# Patient Record
Sex: Female | Born: 1945 | Race: Black or African American | Hispanic: No | Marital: Married | Smoking: Former smoker
Health system: Southern US, Community
[De-identification: ages and names within clinical notes are randomized; demographics above are authoritative.]

## PROBLEM LIST (undated history)

## (undated) DIAGNOSIS — J309 Allergic rhinitis, unspecified: Secondary | ICD-10-CM

## (undated) DIAGNOSIS — E119 Type 2 diabetes mellitus without complications: Secondary | ICD-10-CM

## (undated) DIAGNOSIS — I10 Essential (primary) hypertension: Secondary | ICD-10-CM

## (undated) DIAGNOSIS — G309 Alzheimer's disease, unspecified: Principal | ICD-10-CM

## (undated) DIAGNOSIS — E785 Hyperlipidemia, unspecified: Secondary | ICD-10-CM

## (undated) DIAGNOSIS — R569 Unspecified convulsions: Secondary | ICD-10-CM

## (undated) DIAGNOSIS — F0280 Dementia in other diseases classified elsewhere without behavioral disturbance: Secondary | ICD-10-CM

## (undated) DIAGNOSIS — K649 Unspecified hemorrhoids: Secondary | ICD-10-CM

## (undated) HISTORY — PX: OTHER SURGICAL HISTORY: SHX169

## (undated) HISTORY — DX: Hyperlipidemia, unspecified: E78.5

## (undated) HISTORY — DX: Dementia in other diseases classified elsewhere without behavioral disturbance: F02.80

## (undated) HISTORY — DX: Allergic rhinitis, unspecified: J30.9

## (undated) HISTORY — PX: ECTOPIC PREGNANCY SURGERY: SHX613

## (undated) HISTORY — DX: Unspecified hemorrhoids: K64.9

## (undated) HISTORY — DX: Essential (primary) hypertension: I10

## (undated) HISTORY — DX: Alzheimer's disease, unspecified: G30.9

## (undated) HISTORY — DX: Type 2 diabetes mellitus without complications: E11.9

---

## 1998-05-02 ENCOUNTER — Encounter: Payer: Self-pay | Admitting: Family Medicine

## 1998-05-02 ENCOUNTER — Ambulatory Visit (HOSPITAL_COMMUNITY): Admission: RE | Admit: 1998-05-02 | Discharge: 1998-05-02 | Payer: Self-pay | Admitting: Family Medicine

## 1998-05-20 ENCOUNTER — Ambulatory Visit (HOSPITAL_COMMUNITY): Admission: RE | Admit: 1998-05-20 | Discharge: 1998-05-20 | Payer: Self-pay | Admitting: Family Medicine

## 1998-05-20 ENCOUNTER — Encounter: Payer: Self-pay | Admitting: Family Medicine

## 2003-01-15 ENCOUNTER — Ambulatory Visit (HOSPITAL_COMMUNITY): Admission: RE | Admit: 2003-01-15 | Discharge: 2003-01-15 | Payer: Self-pay | Admitting: Family Medicine

## 2003-03-11 ENCOUNTER — Emergency Department (HOSPITAL_COMMUNITY): Admission: EM | Admit: 2003-03-11 | Discharge: 2003-03-11 | Payer: Self-pay | Admitting: Emergency Medicine

## 2003-05-10 ENCOUNTER — Other Ambulatory Visit: Admission: RE | Admit: 2003-05-10 | Discharge: 2003-05-10 | Payer: Self-pay | Admitting: Family Medicine

## 2004-01-10 ENCOUNTER — Ambulatory Visit: Payer: Self-pay | Admitting: *Deleted

## 2004-02-14 ENCOUNTER — Ambulatory Visit: Payer: Self-pay | Admitting: Nurse Practitioner

## 2004-05-13 ENCOUNTER — Ambulatory Visit: Payer: Self-pay | Admitting: Nurse Practitioner

## 2004-05-28 ENCOUNTER — Ambulatory Visit: Payer: Self-pay | Admitting: Nurse Practitioner

## 2005-02-17 ENCOUNTER — Ambulatory Visit: Payer: Self-pay | Admitting: Nurse Practitioner

## 2005-12-18 ENCOUNTER — Ambulatory Visit: Payer: Self-pay | Admitting: Nurse Practitioner

## 2006-01-07 ENCOUNTER — Ambulatory Visit (HOSPITAL_COMMUNITY): Admission: RE | Admit: 2006-01-07 | Discharge: 2006-01-07 | Payer: Self-pay | Admitting: Family Medicine

## 2006-04-29 ENCOUNTER — Ambulatory Visit: Payer: Self-pay | Admitting: Nurse Practitioner

## 2006-05-03 ENCOUNTER — Ambulatory Visit: Payer: Self-pay | Admitting: Nurse Practitioner

## 2006-05-05 ENCOUNTER — Ambulatory Visit: Payer: Self-pay | Admitting: Nurse Practitioner

## 2006-07-29 ENCOUNTER — Ambulatory Visit: Payer: Self-pay | Admitting: Nurse Practitioner

## 2006-10-07 ENCOUNTER — Ambulatory Visit: Payer: Self-pay | Admitting: Family Medicine

## 2007-01-12 ENCOUNTER — Encounter (INDEPENDENT_AMBULATORY_CARE_PROVIDER_SITE_OTHER): Payer: Self-pay | Admitting: *Deleted

## 2007-01-26 ENCOUNTER — Ambulatory Visit: Payer: Self-pay | Admitting: Family Medicine

## 2007-06-10 ENCOUNTER — Ambulatory Visit: Payer: Self-pay | Admitting: Family Medicine

## 2007-06-24 ENCOUNTER — Ambulatory Visit: Payer: Self-pay | Admitting: Family Medicine

## 2007-08-23 ENCOUNTER — Encounter (INDEPENDENT_AMBULATORY_CARE_PROVIDER_SITE_OTHER): Payer: Self-pay | Admitting: Nurse Practitioner

## 2007-08-23 ENCOUNTER — Ambulatory Visit: Payer: Self-pay | Admitting: Family Medicine

## 2007-08-23 LAB — CONVERTED CEMR LAB
ALT: 23 units/L (ref 0–35)
AST: 21 units/L (ref 0–37)
Albumin: 4.5 g/dL (ref 3.5–5.2)
Alkaline Phosphatase: 88 units/L (ref 39–117)
BUN: 11 mg/dL (ref 6–23)
Basophils Absolute: 0 10*3/uL (ref 0.0–0.1)
Basophils Relative: 1 % (ref 0–1)
CO2: 22 meq/L (ref 19–32)
Calcium: 9.7 mg/dL (ref 8.4–10.5)
Chloride: 101 meq/L (ref 96–112)
Cholesterol: 222 mg/dL — ABNORMAL HIGH (ref 0–200)
Creatinine, Ser: 0.64 mg/dL (ref 0.40–1.20)
Eosinophils Absolute: 0.5 10*3/uL (ref 0.0–0.7)
Eosinophils Relative: 8 % — ABNORMAL HIGH (ref 0–5)
Glucose, Bld: 147 mg/dL — ABNORMAL HIGH (ref 70–99)
HCT: 39.2 % (ref 36.0–46.0)
HDL: 36 mg/dL — ABNORMAL LOW (ref >39)
Hemoglobin: 12.6 g/dL (ref 12.0–15.0)
LDL Cholesterol: 143 mg/dL — ABNORMAL HIGH (ref 0–99)
Lymphocytes Relative: 38 % (ref 12–46)
Lymphs Abs: 2.3 10*3/uL (ref 0.7–4.0)
MCHC: 32.1 g/dL (ref 30.0–36.0)
MCV: 88.9 fL (ref 78.0–100.0)
Monocytes Absolute: 0.6 10*3/uL (ref 0.1–1.0)
Monocytes Relative: 9 % (ref 3–12)
Neutro Abs: 2.7 10*3/uL (ref 1.7–7.7)
Neutrophils Relative %: 45 % (ref 43–77)
Platelets: 310 10*3/uL (ref 150–400)
Potassium: 4.2 meq/L (ref 3.5–5.3)
RBC: 4.41 M/uL (ref 3.87–5.11)
RDW: 13.6 % (ref 11.5–15.5)
Sodium: 141 meq/L (ref 135–145)
TSH: 1.525 microintl units/mL (ref 0.350–5.50)
Total Bilirubin: 0.7 mg/dL (ref 0.3–1.2)
Total CHOL/HDL Ratio: 6.2
Total Protein: 7.4 g/dL (ref 6.0–8.3)
Triglycerides: 217 mg/dL — ABNORMAL HIGH (ref <150)
VLDL: 43 mg/dL — ABNORMAL HIGH (ref 0–40)
WBC: 6 10*3/uL (ref 4.0–10.5)

## 2007-09-01 ENCOUNTER — Ambulatory Visit (HOSPITAL_COMMUNITY): Admission: RE | Admit: 2007-09-01 | Discharge: 2007-09-01 | Payer: Self-pay | Admitting: Family Medicine

## 2007-10-20 ENCOUNTER — Ambulatory Visit: Payer: Self-pay | Admitting: Internal Medicine

## 2007-11-10 ENCOUNTER — Ambulatory Visit: Payer: Self-pay | Admitting: Family Medicine

## 2007-11-10 LAB — CONVERTED CEMR LAB: Microalb, Ur: 0.77 mg/dL (ref 0.00–1.89)

## 2007-11-16 ENCOUNTER — Ambulatory Visit: Payer: Self-pay | Admitting: Family Medicine

## 2007-11-16 LAB — CONVERTED CEMR LAB
Cholesterol: 216 mg/dL — ABNORMAL HIGH (ref 0–200)
HDL: 37 mg/dL — ABNORMAL LOW (ref >39)
LDL Cholesterol: 138 mg/dL — ABNORMAL HIGH (ref 0–99)
Total CHOL/HDL Ratio: 5.8
Triglycerides: 205 mg/dL — ABNORMAL HIGH (ref <150)
VLDL: 41 mg/dL — ABNORMAL HIGH (ref 0–40)

## 2007-12-20 ENCOUNTER — Ambulatory Visit: Payer: Self-pay | Admitting: Internal Medicine

## 2008-05-10 ENCOUNTER — Ambulatory Visit: Payer: Self-pay | Admitting: Family Medicine

## 2008-05-10 LAB — CONVERTED CEMR LAB
ALT: 16 units/L (ref 0–35)
Cholesterol: 122 mg/dL (ref 0–200)
HDL: 35 mg/dL — ABNORMAL LOW (ref >39)
LDL Cholesterol: 64 mg/dL (ref 0–99)
Total CHOL/HDL Ratio: 3.5
Triglycerides: 115 mg/dL (ref <150)
VLDL: 23 mg/dL (ref 0–40)

## 2008-05-22 ENCOUNTER — Ambulatory Visit: Payer: Self-pay | Admitting: Family Medicine

## 2008-06-08 ENCOUNTER — Ambulatory Visit: Payer: Self-pay | Admitting: Family Medicine

## 2008-10-02 ENCOUNTER — Ambulatory Visit: Payer: Self-pay | Admitting: Internal Medicine

## 2008-10-10 ENCOUNTER — Ambulatory Visit: Payer: Self-pay | Admitting: Internal Medicine

## 2008-11-27 ENCOUNTER — Telehealth (INDEPENDENT_AMBULATORY_CARE_PROVIDER_SITE_OTHER): Payer: Self-pay | Admitting: *Deleted

## 2008-12-19 ENCOUNTER — Telehealth (INDEPENDENT_AMBULATORY_CARE_PROVIDER_SITE_OTHER): Payer: Self-pay | Admitting: *Deleted

## 2008-12-20 ENCOUNTER — Telehealth (INDEPENDENT_AMBULATORY_CARE_PROVIDER_SITE_OTHER): Payer: Self-pay | Admitting: *Deleted

## 2009-01-03 ENCOUNTER — Ambulatory Visit: Payer: Self-pay | Admitting: Family Medicine

## 2009-01-03 LAB — CONVERTED CEMR LAB
Herpes Simplex Vrs I&II-IgM Ab (EIA): 0.2
Microalb, Ur: 2.9 mg/dL — ABNORMAL HIGH (ref 0.00–1.89)

## 2009-01-04 ENCOUNTER — Encounter (INDEPENDENT_AMBULATORY_CARE_PROVIDER_SITE_OTHER): Payer: Self-pay | Admitting: Family Medicine

## 2009-01-31 ENCOUNTER — Encounter (INDEPENDENT_AMBULATORY_CARE_PROVIDER_SITE_OTHER): Payer: Self-pay | Admitting: Internal Medicine

## 2009-01-31 ENCOUNTER — Ambulatory Visit: Payer: Self-pay | Admitting: Internal Medicine

## 2009-01-31 LAB — CONVERTED CEMR LAB
ALT: 16 units/L (ref 0–35)
AST: 20 units/L (ref 0–37)
Albumin: 5.1 g/dL (ref 3.5–5.2)
Alkaline Phosphatase: 90 units/L (ref 39–117)
BUN: 11 mg/dL (ref 6–23)
CO2: 24 meq/L (ref 19–32)
Calcium: 10.4 mg/dL (ref 8.4–10.5)
Chloride: 100 meq/L (ref 96–112)
Creatinine, Ser: 0.65 mg/dL (ref 0.40–1.20)
Glucose, Bld: 116 mg/dL — ABNORMAL HIGH (ref 70–99)
Potassium: 3.8 meq/L (ref 3.5–5.3)
Sodium: 141 meq/L (ref 135–145)
Total Bilirubin: 0.8 mg/dL (ref 0.3–1.2)
Total Protein: 7.7 g/dL (ref 6.0–8.3)

## 2009-02-04 ENCOUNTER — Ambulatory Visit: Payer: Self-pay | Admitting: Internal Medicine

## 2009-02-06 ENCOUNTER — Ambulatory Visit: Payer: Self-pay | Admitting: Internal Medicine

## 2009-02-08 ENCOUNTER — Ambulatory Visit (HOSPITAL_COMMUNITY): Admission: RE | Admit: 2009-02-08 | Discharge: 2009-02-08 | Payer: Self-pay | Admitting: Internal Medicine

## 2009-02-14 ENCOUNTER — Ambulatory Visit: Payer: Self-pay | Admitting: Internal Medicine

## 2009-03-14 ENCOUNTER — Ambulatory Visit: Payer: Self-pay | Admitting: Internal Medicine

## 2009-04-02 ENCOUNTER — Ambulatory Visit: Payer: Self-pay | Admitting: Internal Medicine

## 2009-05-06 ENCOUNTER — Ambulatory Visit: Payer: Self-pay | Admitting: Internal Medicine

## 2009-05-06 ENCOUNTER — Encounter (INDEPENDENT_AMBULATORY_CARE_PROVIDER_SITE_OTHER): Payer: Self-pay | Admitting: Family Medicine

## 2009-05-06 LAB — CONVERTED CEMR LAB
ALT: 18 units/L (ref 0–35)
AST: 24 units/L (ref 0–37)
Albumin: 4.7 g/dL (ref 3.5–5.2)
Alkaline Phosphatase: 95 units/L (ref 39–117)
BUN: 12 mg/dL (ref 6–23)
CO2: 23 meq/L (ref 19–32)
Calcium: 9.6 mg/dL (ref 8.4–10.5)
Chloride: 99 meq/L (ref 96–112)
Cholesterol: 140 mg/dL (ref 0–200)
Creatinine, Ser: 0.66 mg/dL (ref 0.40–1.20)
Glucose, Bld: 94 mg/dL (ref 70–99)
HDL: 35 mg/dL — ABNORMAL LOW (ref >39)
Hgb A1c MFr Bld: 7.1 % — ABNORMAL HIGH (ref 4.6–6.1)
LDL Cholesterol: 76 mg/dL (ref 0–99)
Microalb, Ur: 1.28 mg/dL (ref 0.00–1.89)
Potassium: 3.9 meq/L (ref 3.5–5.3)
Sodium: 139 meq/L (ref 135–145)
Total Bilirubin: 0.6 mg/dL (ref 0.3–1.2)
Total CHOL/HDL Ratio: 4
Total Protein: 7.5 g/dL (ref 6.0–8.3)
Triglycerides: 144 mg/dL (ref <150)
VLDL: 29 mg/dL (ref 0–40)
Vit D, 25-Hydroxy: 36 ng/mL (ref 30–89)

## 2009-07-24 ENCOUNTER — Ambulatory Visit: Payer: Self-pay | Admitting: Internal Medicine

## 2009-08-05 ENCOUNTER — Ambulatory Visit: Payer: Self-pay | Admitting: Family Medicine

## 2009-12-17 ENCOUNTER — Ambulatory Visit: Payer: Self-pay | Admitting: Family Medicine

## 2009-12-31 ENCOUNTER — Ambulatory Visit: Payer: Self-pay | Admitting: Family Medicine

## 2009-12-31 LAB — CONVERTED CEMR LAB
ALT: 13 units/L (ref 0–35)
AST: 19 units/L (ref 0–37)
Albumin: 4.5 g/dL (ref 3.5–5.2)
Alkaline Phosphatase: 77 units/L (ref 39–117)
Anti Nuclear Antibody(ANA): NEGATIVE
BUN: 11 mg/dL (ref 6–23)
Basophils Absolute: 0 10*3/uL (ref 0.0–0.1)
Basophils Relative: 0 % (ref 0–1)
CO2: 26 meq/L (ref 19–32)
CRP: 1.6 mg/dL — ABNORMAL HIGH (ref <0.6)
Calcium: 9.8 mg/dL (ref 8.4–10.5)
Chloride: 100 meq/L (ref 96–112)
Creatinine, Ser: 0.64 mg/dL (ref 0.40–1.20)
Eosinophils Absolute: 0.5 10*3/uL (ref 0.0–0.7)
Eosinophils Relative: 8 % — ABNORMAL HIGH (ref 0–5)
Glucose, Bld: 105 mg/dL — ABNORMAL HIGH (ref 70–99)
HCT: 39.4 % (ref 36.0–46.0)
Hemoglobin: 12.5 g/dL (ref 12.0–15.0)
Hgb A1c MFr Bld: 6.7 % — ABNORMAL HIGH (ref <5.7)
Lymphocytes Relative: 40 % (ref 12–46)
Lymphs Abs: 2.5 10*3/uL (ref 0.7–4.0)
MCHC: 31.7 g/dL (ref 30.0–36.0)
MCV: 90.2 fL (ref 78.0–100.0)
Monocytes Absolute: 0.6 10*3/uL (ref 0.1–1.0)
Monocytes Relative: 10 % (ref 3–12)
Neutro Abs: 2.6 10*3/uL (ref 1.7–7.7)
Neutrophils Relative %: 42 % — ABNORMAL LOW (ref 43–77)
Platelets: 300 10*3/uL (ref 150–400)
Potassium: 3.8 meq/L (ref 3.5–5.3)
RBC: 4.37 M/uL (ref 3.87–5.11)
RDW: 13.3 % (ref 11.5–15.5)
Sed Rate: 20 mm/hr (ref 0–22)
Sodium: 138 meq/L (ref 135–145)
TSH: 2.43 microintl units/mL (ref 0.350–4.500)
Total Bilirubin: 0.7 mg/dL (ref 0.3–1.2)
Total Protein: 7.4 g/dL (ref 6.0–8.3)
WBC: 6.2 10*3/uL (ref 4.0–10.5)

## 2010-04-15 ENCOUNTER — Ambulatory Visit (HOSPITAL_COMMUNITY)
Admission: RE | Admit: 2010-04-15 | Discharge: 2010-04-15 | Payer: Self-pay | Source: Home / Self Care | Attending: Internal Medicine | Admitting: Internal Medicine

## 2010-05-29 NOTE — Progress Notes (Signed)
Summary: triage CRACKED SKIN CORNERS OF MOUTH  Phone Note Call from Patient   Reason for Call: Talk to Nurse Details for Reason: CRACKED SKIN CORNERS OF MOUTH Summary of Call: 11/27/08 Patient's corners her mouth are cracked and sore.Marland KitchenMarland KitchenPatient is diabetic...no known fevers or cold...patient has been putting vaseline on it...advised to apply otc antibiotic cream to the outside of corners of mouth.Marland KitchenMarland KitchenIf gets any worse or no improvement call back... Initial call taken by: Conchita Paris RN

## 2010-05-29 NOTE — Progress Notes (Signed)
Summary: triage call  Phone Note Call from Patient   Details for Reason: left vm stating used antibacterail on the corners of her  mouth, not improving. Rash on arm-call back to patient, lft vm for her to return my call to discuss.    Initial call taken by: Blondell Reveal RN,  December 19, 2008 4:11 PM

## 2010-05-29 NOTE — Progress Notes (Signed)
Summary: triage-rash  Phone Note Call from Patient   Reason for Call: Talk to Nurse Summary of Call: patient called  stating she has rash on arm that is itchy...patient has been pulling weeds outside...patient had previously called 8/3 with cracked lips...states now they are better but not yet healed...advised patient to use OTC blistex for lips and calamine lotion for rash..advised patent to call back if needed. Initial call taken by: Conchita Paris,  December 20, 2008 1:04 PM

## 2010-07-15 ENCOUNTER — Encounter (INDEPENDENT_AMBULATORY_CARE_PROVIDER_SITE_OTHER): Payer: Self-pay | Admitting: *Deleted

## 2010-07-15 LAB — CONVERTED CEMR LAB
ALT: 16 units/L (ref 0–35)
AST: 18 units/L (ref 0–37)
Albumin: 4.8 g/dL (ref 3.5–5.2)
Alkaline Phosphatase: 77 units/L (ref 39–117)
BUN: 10 mg/dL (ref 6–23)
CO2: 23 meq/L (ref 19–32)
Calcium: 10.1 mg/dL (ref 8.4–10.5)
Chloride: 103 meq/L (ref 96–112)
Cholesterol: 168 mg/dL (ref 0–200)
Creatinine, Ser: 0.72 mg/dL (ref 0.40–1.20)
Glucose, Bld: 138 mg/dL — ABNORMAL HIGH (ref 70–99)
HDL: 41 mg/dL (ref >39)
LDL Cholesterol: 93 mg/dL (ref 0–99)
Microalb, Ur: 0.96 mg/dL (ref 0.00–1.89)
Potassium: 3.8 meq/L (ref 3.5–5.3)
Sodium: 140 meq/L (ref 135–145)
TSH: 1.003 microintl units/mL (ref 0.350–4.500)
Total Bilirubin: 0.2 mg/dL — ABNORMAL LOW (ref 0.3–1.2)
Total CHOL/HDL Ratio: 4.1
Total Protein: 7.8 g/dL (ref 6.0–8.3)
Triglycerides: 170 mg/dL — ABNORMAL HIGH (ref <150)
VLDL: 34 mg/dL (ref 0–40)
Vitamin B-12: 544 pg/mL (ref 211–911)

## 2012-01-28 ENCOUNTER — Ambulatory Visit: Payer: Self-pay | Admitting: Family Medicine

## 2012-02-02 ENCOUNTER — Encounter: Payer: Self-pay | Admitting: Family Medicine

## 2012-02-02 ENCOUNTER — Ambulatory Visit (INDEPENDENT_AMBULATORY_CARE_PROVIDER_SITE_OTHER): Payer: Self-pay | Admitting: Family Medicine

## 2012-02-02 VITALS — BP 148/84 | HR 71 | Ht 68.0 in | Wt 186.0 lb

## 2012-02-02 DIAGNOSIS — I10 Essential (primary) hypertension: Secondary | ICD-10-CM

## 2012-02-02 DIAGNOSIS — E785 Hyperlipidemia, unspecified: Secondary | ICD-10-CM

## 2012-02-02 DIAGNOSIS — E119 Type 2 diabetes mellitus without complications: Secondary | ICD-10-CM | POA: Insufficient documentation

## 2012-02-02 DIAGNOSIS — Z0000 Encounter for general adult medical examination without abnormal findings: Secondary | ICD-10-CM

## 2012-02-02 DIAGNOSIS — Z23 Encounter for immunization: Secondary | ICD-10-CM

## 2012-02-02 MED ORDER — METFORMIN HCL 1000 MG PO TABS: 1000.0000 mg | ORAL_TABLET | Freq: Two times a day (BID) | ORAL | Status: DC

## 2012-02-02 MED ORDER — SIMVASTATIN 20 MG PO TABS: 20.0000 mg | ORAL_TABLET | Freq: Every day | ORAL | Status: DC

## 2012-02-02 NOTE — Assessment & Plan Note (Addendum)
LDL goal <70 due to concurrent diabetes. Has been taking crestor 20mg  daily, but reports that this upsets her stomach. Will switch to simvastatin 20mg  daily (have to go with lower dose due to cross-reactivity with amlodipine). Unable to do labs today as she would have to pay out of pocket, but her medicare kicks in next month.   F/u in early December 2013 for "Welcome to Medicare" visit. I will see her 1-2 weeks after that to check lipid panel and see how she is doing.

## 2012-02-02 NOTE — Assessment & Plan Note (Addendum)
Pt taking metformin 1000mg  BID. New patient - would like to check hgb a1c and BMET today, but her medicare will become active next month and she would prefer to wait until then to avoid out of pocket cost. Creatinine was 0.72 in early 2012. Will give refill of metformin to last 1-2 months until can return with medicare coverage for labs. Last a1c that I can see from EMR was 6.7 several years ago. Will obtain [facility] records.  Foot exam - needs at next visit Eye exam - last visit 1 year ago, will need to return in next few months Renal protection- on ARB Cardiac protection - on aspirin and statin  F/u in early December 2013 for "Welcome to Medicare" visit. I will see her 1-2 weeks after that to check labs and see how she is doing.

## 2012-02-02 NOTE — Patient Instructions (Addendum)
I am sending a refill of your metformin (diabetes medicine) to [facility]. I am also sending a new cholesterol medicine there, which should be $4.00. You should STOP taking the Crestor. Let me know if you have any problems with the medication.  You can pick them up at the [facility] at [facility] in [place].  On your way out today please make an appointment for your "Welcome to Medicare Visit" for early December. Also make an appointment to see me 1-2 weeks after that appointment (in mid-December)  When I see you we will check your bloodwork.

## 2012-02-02 NOTE — Progress Notes (Signed)
Patient ID: Paula Reed, female   DOB: [birth year], 66 y.o.   MRN: [id number]  HPI: Paula Reed is a 66 y.o. female here to establish care. She was previously a patient of [facility], which recently closed. Has not been seen there in over 6 months. She has no complaints today but is requesting a refill on her metformin. She will turn 66 next month and has her Medicare enrollment pending.  Diabetes: Taking metformin 1000mg  BID. Checks blood sugar at home and has lowest values of high 90s. Told RN that average CBGs are 225-230. Takes aspirin 81mg  daily.  Blood pressure: Takes amlodipine 10mg  daily, and losartan 100mg  daily. Checks at home and gets 135-140. Denies headache, vision changes, chest pain, shortness of breath, palpitations, swelling in legs, or lightheadedness.  Hyperlipidemia: takes crestor 20mg , but says it upsets her stomach.   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   ROS  Per HPI. Questionnaire form positive for headaches although pt denied HA in interview.  PMH:  Allergic rhinitis, diabetes, hypertension, and hyperlipidemia.  No hospitalizations. 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  Family Hx: Mom with hypertension. Dad with hypertension and heart disease (cause of death).  Health Maintenance: Last mammogram done in 03/2010, normal Pap smear - got done at [facility], but has been a while. EMR shows negative pap in 2009.  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 Lives with her husband. Completed some high school. No alcohol. Does not exercise. Nonsmoker. No drug use. Feels safe in relationship. Negative depression screen on 02/02/12.   Exam: Gen: NAD, pleasant, cooperative HEENT: no oral lesions, has dentures Heart: RRR Lungs: CTAB Ext: No LE edema Neuro: nonfocal, speech intact

## 2012-02-02 NOTE — Assessment & Plan Note (Signed)
Flu shot given today. Due for a mammogram -will discuss this at next visit after her medicare is effective. Need to discuss colonoscopy with pt as well. Last pap negative in 2009 - should repeat this or at least obtain records showing prior negative paps. Will obtain [facility] records.

## 2012-02-02 NOTE — Assessment & Plan Note (Signed)
Currently on amlodipine 10mg  and losartan 100mg  daily. BP mildly elevated today at 148/86, but pt did not take medications this morning. Will continue current regimen and recheck at f/u appt in mid December 2013.

## 2012-03-15 ENCOUNTER — Encounter: Payer: Self-pay | Admitting: Family Medicine

## 2012-04-14 ENCOUNTER — Telehealth: Payer: Self-pay | Admitting: Family Medicine

## 2012-04-14 NOTE — Telephone Encounter (Signed)
[facility] is calling for refills on Generic Norvasc 10mg , Losartan 100mg  and Lamitil 250mg .

## 2012-04-15 ENCOUNTER — Other Ambulatory Visit: Payer: Self-pay | Admitting: Family Medicine

## 2012-04-15 MED ORDER — AMLODIPINE BESYLATE 10 MG PO TABS: 10.0000 mg | ORAL_TABLET | Freq: Every day | ORAL | Status: DC

## 2012-04-15 MED ORDER — LOSARTAN POTASSIUM 100 MG PO TABS: 100.0000 mg | ORAL_TABLET | Freq: Every day | ORAL | Status: DC

## 2012-04-15 NOTE — Telephone Encounter (Signed)
Forward to PCP for refills 

## 2012-04-15 NOTE — Telephone Encounter (Signed)
I called the # attached to the message and as soon as it answered the recording said [facility].

## 2012-04-15 NOTE — Telephone Encounter (Signed)
Will send in a month's refill for amlodipine and losartan. I have not prescribed lamisil to patient previously, and in my previous visit with her she was unable to say why she is taking this, so I do not feel comfortable prescribing this to her now. Pt has f/u appt scheduled for one week from today, at which time we can discuss why she is on lamisil.  If necessary, please call [facility] and/or patient back to tell them I will not be refilling lamisil at this time. Thanks!

## 2012-04-15 NOTE — Telephone Encounter (Signed)
Losartan and [facility] (see Rx info below from Dr. Pollie Meyer).  Gaylene Brooks, RN

## 2012-04-15 NOTE — Telephone Encounter (Signed)
Forward back to [place] who took the message to find out which [facility].[place], Rodena Medin

## 2012-04-15 NOTE — Telephone Encounter (Signed)
Which [facility]? I have only sent scripts to [facility] for her in the past. Once we know which [facility] will be happy to send in rx for amlodipine & losartan. If you find it out, okay to call in order for:  amlodipine 10mg  1 tab PO daily #30 pills losartan 100mg  1 tab PO daily #30 pills  Otherwise I can just send it in electronically when we know the pharmacy. Just let me know. Thanks!

## 2012-04-15 NOTE — Telephone Encounter (Signed)
Forward to [place] for review.

## 2012-04-22 ENCOUNTER — Ambulatory Visit (INDEPENDENT_AMBULATORY_CARE_PROVIDER_SITE_OTHER): Payer: Medicare Other | Admitting: Family Medicine

## 2012-04-22 VITALS — BP 158/78 | Temp 97.9°F | Ht 68.0 in | Wt 187.0 lb

## 2012-04-22 DIAGNOSIS — R011 Cardiac murmur, unspecified: Secondary | ICD-10-CM

## 2012-04-22 DIAGNOSIS — E119 Type 2 diabetes mellitus without complications: Secondary | ICD-10-CM

## 2012-04-22 DIAGNOSIS — Z0000 Encounter for general adult medical examination without abnormal findings: Secondary | ICD-10-CM

## 2012-04-22 DIAGNOSIS — E785 Hyperlipidemia, unspecified: Secondary | ICD-10-CM

## 2012-04-22 DIAGNOSIS — I10 Essential (primary) hypertension: Secondary | ICD-10-CM

## 2012-04-22 LAB — POCT GLYCOSYLATED HEMOGLOBIN (HGB A1C): Hemoglobin A1C: 6.2

## 2012-04-22 NOTE — Assessment & Plan Note (Addendum)
We were unable to check labs last visit (her first visit to the [facility]) due to financial concerns, but now she has Medicare. Pt has eaten already today, so she will come back for fasting labs, at which time we'll check CMET, lipid panel, CBC.   *If requires increase in statin, will need to switch to a different statin as she is also on amlodipine and is on the max dose of simvastatin (20mg ) for concurrent use with amlodipine.

## 2012-04-22 NOTE — Assessment & Plan Note (Addendum)
Pt has soft systolic 2/6 murmur loudest at LUSB. Likely systolic flow murmur and is benign. She did have two momentary episodes of shortness of breath, which I doubt are related to the murmur. Offered to her today that we could get an EKG, but she'd prefer to delay this to another visit, which I think is reasonable. Will plan to obtain EKG at next visit.   Precepted with Dr. Leveda Anna, who also examined patient and agrees with this plan.

## 2012-04-22 NOTE — Assessment & Plan Note (Addendum)
BP elevated here in clinic today, but pt reports good control at home. Likely situational today, as she's been stressed. Will continue current regimen (losartan & amlodipine) and f/u in 3 months.  We were unable to check labs last visit (her first visit to the [facility]) due to financial concerns, but now she has Medicare. Check CMET, lipid panel, CBC at morning lab appt - pt to schedule.

## 2012-04-22 NOTE — Patient Instructions (Addendum)
It was great to see you again today!  Blood Pressure: For your blood pressure, please check your BP at home and write down the numbers, so we can see how you're doing at home. Bring the numbers here with you.  If you are consistently getting over 140/90, call the office so we can see you.  Labs: Schedule an appointment to get your morning laps drawn one morning next week. Don't eat breakfast before you come. I will call you if your test results are not normal.  Otherwise, I will send you a letter.  If you do not hear from me with in 2 weeks please call our office.     Mammogram: Schedule your mammogram by calling the number on the handout.   Pap & Colonoscopy: I'll look through your [facility] records to see when you need a pap & colonoscopy and we can talk about those at your next visit, or sooner if needed.  If nothing else changes, I'll see you back in 3 months. Have a Happy New Year! -Dr. Pollie Meyer

## 2012-04-22 NOTE — Progress Notes (Signed)
Patient ID: Paula Reed, female   DOB: [birth year], 66 y.o.   MRN: [id number]  HPI: Paula Reed is a 66 y.o. female here to follow up on her HTN, HLD, and DM.  She says she is feeling well. 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 Denies chest pain, leg swelling, vision changes, abdominal pain, dizziness, or numbness. Did have two brief episodes of shortness of breath, that occurred about two weeks ago. She felt better when she sat down, and the episodes were just momentary, but they did frighten her.  She checks her BP at home and gets 135-140/70-80 usually. It's high here in clinic, but she says she's been stressed out with taking care of her grandchild today.  Checks her sugars at home and gets 120s-140s usually. Got as low as 70 one day, which scared her, but she felt okay aside from being scared that the number was so low.  ROS: See HPI  PHYSICAL EXAM: BP 158/78  Temp 97.9 F (36.6 C) (Oral)  Ht 5\' 8"  (1.727 m)  Wt 187 lb (84.823 kg)  BMI 28.43 kg/m2 My recheck: 156/84 Gen: NAD, pleasant, cooperative Heart: RRR, 2/6 systolic crescendo/decrescendo murmur loudest at LUSB Lungs: CTAB, NWOB Neuro: nonfocal, speech intact Feet: normal monofilament testing, no ulcers seen

## 2012-04-22 NOTE — Assessment & Plan Note (Signed)
-  Handout given today on scheduling mammogram. -I have her old [facility] records, and will review them to see if she is due for a colonoscopy or pap. Will need to see that she has had adequate screening in the last 10 years if we decide to d/c doing paps on her now that she's 66. -Got flu shot last visit.

## 2012-04-22 NOTE — Assessment & Plan Note (Addendum)
A1c checked in clinic and is 6.2, at goal. Continue current regimen. Check CMET, lipid panel, CBC at morning lab appt - pt to schedule.  Foot exam - done today, normal monofilament. Did not check DP pulses - will do this at next visit Eye exam - need to address next time Renal protection - on ARB Cardiac protection - on statin & aspirin

## 2012-05-02 ENCOUNTER — Other Ambulatory Visit: Payer: Self-pay | Admitting: Family Medicine

## 2012-05-02 DIAGNOSIS — Z1231 Encounter for screening mammogram for malignant neoplasm of breast: Secondary | ICD-10-CM

## 2012-05-04 ENCOUNTER — Other Ambulatory Visit: Payer: Self-pay | Admitting: Family Medicine

## 2012-05-04 NOTE — Telephone Encounter (Signed)
Refilled test strips an dsupplies per fax request. Supplies for testing 1 time per day.

## 2012-05-09 ENCOUNTER — Encounter: Payer: Self-pay | Admitting: Family Medicine

## 2012-05-17 ENCOUNTER — Other Ambulatory Visit: Payer: Self-pay | Admitting: Family Medicine

## 2012-05-18 ENCOUNTER — Telehealth: Payer: Self-pay | Admitting: Family Medicine

## 2012-05-18 NOTE — Telephone Encounter (Signed)
Please call pt and let her know that I have sent in a month's worth of metformin, but she needs to schedule lab appointment to come in and get fasting labs checked. We planned this at her last visit but she has not come and gotten her labs drawn.

## 2012-05-19 NOTE — Telephone Encounter (Signed)
Called pt. Informed. Scheduled appt with you 05-24-12 at 8:30 am and explained to pt that she needs to be fasting. Pt verbalized understanding. Lorenda Hatchet, Renato Battles

## 2012-05-24 ENCOUNTER — Encounter: Payer: Self-pay | Admitting: Family Medicine

## 2012-05-24 ENCOUNTER — Ambulatory Visit (HOSPITAL_COMMUNITY)
Admission: RE | Admit: 2012-05-24 | Discharge: 2012-05-24 | Disposition: A | Discharge status: Home / Self Care | Payer: Medicare Other | Source: Ambulatory Visit | Attending: Family Medicine | Admitting: Family Medicine

## 2012-05-24 ENCOUNTER — Ambulatory Visit (INDEPENDENT_AMBULATORY_CARE_PROVIDER_SITE_OTHER): Payer: Medicare Other | Admitting: Family Medicine

## 2012-05-24 VITALS — BP 152/83 | HR 86 | Ht 68.0 in | Wt 180.6 lb

## 2012-05-24 DIAGNOSIS — R011 Cardiac murmur, unspecified: Secondary | ICD-10-CM

## 2012-05-24 DIAGNOSIS — E119 Type 2 diabetes mellitus without complications: Secondary | ICD-10-CM

## 2012-05-24 DIAGNOSIS — I498 Other specified cardiac arrhythmias: Secondary | ICD-10-CM | POA: Insufficient documentation

## 2012-05-24 DIAGNOSIS — E785 Hyperlipidemia, unspecified: Secondary | ICD-10-CM

## 2012-05-24 DIAGNOSIS — I10 Essential (primary) hypertension: Secondary | ICD-10-CM

## 2012-05-24 DIAGNOSIS — Z0000 Encounter for general adult medical examination without abnormal findings: Secondary | ICD-10-CM

## 2012-05-24 LAB — COMPREHENSIVE METABOLIC PANEL
ALT: 21 U/L (ref 0–35)
AST: 18 U/L (ref 0–37)
Albumin: 4.6 g/dL (ref 3.5–5.2)
Alkaline Phosphatase: 83 U/L (ref 39–117)
BUN: 9 mg/dL (ref 6–23)
CO2: 25 mEq/L (ref 19–32)
Calcium: 10 mg/dL (ref 8.4–10.5)
Chloride: 106 mEq/L (ref 96–112)
Creat: 0.82 mg/dL (ref 0.50–1.10)
Glucose, Bld: 117 mg/dL — ABNORMAL HIGH (ref 70–99)
Potassium: 3.9 mEq/L (ref 3.5–5.3)
Sodium: 145 mEq/L (ref 135–145)
Total Bilirubin: 0.6 mg/dL (ref 0.3–1.2)
Total Protein: 7.1 g/dL (ref 6.0–8.3)

## 2012-05-24 LAB — CBC
HCT: 39.1 % (ref 36.0–46.0)
Hemoglobin: 13 g/dL (ref 12.0–15.0)
MCH: 28.7 pg (ref 26.0–34.0)
MCHC: 33.2 g/dL (ref 30.0–36.0)
MCV: 86.3 fL (ref 78.0–100.0)
Platelets: 308 10*3/uL (ref 150–400)
RBC: 4.53 MIL/uL (ref 3.87–5.11)
RDW: 13.7 % (ref 11.5–15.5)
WBC: 5.7 10*3/uL (ref 4.0–10.5)

## 2012-05-24 LAB — LIPID PANEL
Cholesterol: 192 mg/dL (ref 0–200)
HDL: 35 mg/dL — ABNORMAL LOW (ref >39)
LDL Cholesterol: 135 mg/dL — ABNORMAL HIGH (ref 0–99)
Total CHOL/HDL Ratio: 5.5 Ratio
Triglycerides: 111 mg/dL (ref <150)
VLDL: 22 mg/dL (ref 0–40)

## 2012-05-24 MED ORDER — AMLODIPINE BESYLATE 10 MG PO TABS: 10.0000 mg | ORAL_TABLET | Freq: Every day | ORAL | Status: DC

## 2012-05-24 MED ORDER — LOSARTAN POTASSIUM 100 MG PO TABS: 100.0000 mg | ORAL_TABLET | Freq: Every day | ORAL | Status: DC

## 2012-05-24 NOTE — Assessment & Plan Note (Signed)
A1c last checked ~1 mo ago, so will not repeat today. Had good control at that time (6.2). Pt is here for her fasting labs which she needed checked last time, so will go ahead and get those today. Will need repeat a1c in 2 months.

## 2012-05-24 NOTE — Assessment & Plan Note (Signed)
BP remains elevated but reports she has been out of losartan for one month. Is taking amlodipine. Gave refill on losartan; will have her f/u in 1 mo for CPE with pap at which time we can recheck her blood pressure.

## 2012-05-24 NOTE — Assessment & Plan Note (Signed)
-  Pap: negative paps on 08/23/2007 and 01/04/2003 per [facility] records. Plan for CPE with pap smear next visit. We may be able to d/c pap smears if this next one is negative. -Colonoscopy: [facility] records show she did FOBTs on 09/10/09. Will discuss colonoscopy with her at physical (next visit). -Mammogram - has scheduled for tomorrow -DEXA scan - will order at next visit.

## 2012-05-24 NOTE — Assessment & Plan Note (Addendum)
Murmur still present. Exam findings make it difficult to declare that this is a truly innocent flow murmur as it does not get louder with squatting. EKG today shows slight bradycardia but sinus rhythm; no LVH. Pt has never had an echo before. No symptoms at this time. Discussed with her that we could get an echo or just monitor her cardiac exam periodically. At this time, we have decided together to just monitor her exam. Discussed with Dr. Deirdre Priest who also examined pt and agrees with this plan.  She will f/u in 1 month for CPE with pap smear, at which time will auscultate her heart again, or sooner if becomes symptomatic.

## 2012-05-24 NOTE — Assessment & Plan Note (Signed)
Check fasting lipids today, titrate statin as needed. If need to increase statin will have to switch to another statin as she is on the max dose of simvastatin that can be taken concurrently with amlodipine.

## 2012-05-24 NOTE — Patient Instructions (Addendum)
It was great to see you again today!  I have sent in refills on your BP medicines. Check your BP every day at home and write down the numbers, bring them in with you to your next visit. Also bring in your medicines to every visit.  If you have any chest pain, shortness of breath, or any concerning symptoms please call the clinic to be seen as soon as possible.  We checked labwork today. I will call you if your test results are not normal.  Otherwise, I will send you a letter.  If you do not hear from me with in 2 weeks please call our office.     Schedule an appointment for a complete physical and pap smear for one month from now; we can recheck your blood pressure at that time.  Be well, Dr. Pollie Meyer

## 2012-05-24 NOTE — Progress Notes (Signed)
Patient ID: ITZY ADLER, female   DOB: 16-Mar-1946, 67 y.o.   MRN: [id number]   CC: ZAMARA COZAD is a 67 y.o. female here to get fasting labs and follow up on hypertension.  HPI:  Last visit was about one month ago, we addressed diabetes and hypertension but she was supposed to come back and get fasting labs.  Hypertension: Has been out of cozaar for about one month but is taking amlodipine. Feels well. Denies: chest pain, SOB, headaches, vision changes. Does c/o dry mouth. Has had some R knee swelling just today; thinks due to cold weather.  Has been stressed out with taking care of multiple children.  ROS: See HPI  PHYSICAL EXAM: BP 152/83  Pulse 86  Ht 5\' 8"  (1.727 m)  Wt 180 lb 9.6 oz (81.92 kg)  BMI 27.46 kg/m2 Gen: NAD, pleasant, cooperative Heart: RRR, 2/6 early systolic crescendo murmur loudest at RUSB. No change in volume with squatting. No thrill palpable. Lungs: CTAB, NWOB Neuro: nonfocal, speech intact Ext: no LE swelling noted, 2+ DP bilaterally

## 2012-05-25 ENCOUNTER — Ambulatory Visit: Payer: Medicare Other

## 2012-05-26 ENCOUNTER — Other Ambulatory Visit: Payer: Self-pay | Admitting: Family Medicine

## 2012-05-26 MED ORDER — ATORVASTATIN CALCIUM 40 MG PO TABS: 40.0000 mg | ORAL_TABLET | Freq: Every day | ORAL | Status: DC

## 2012-05-26 NOTE — Telephone Encounter (Signed)
Called patient to let her know cholesterol elevated. She is okay with changing her cholesterol medicine. Will STOP simvastatin 20mg  daily and START atorvastatin 40mg  daily.

## 2012-06-20 ENCOUNTER — Ambulatory Visit (INDEPENDENT_AMBULATORY_CARE_PROVIDER_SITE_OTHER): Payer: Medicare Other | Admitting: Family Medicine

## 2012-06-20 ENCOUNTER — Encounter: Payer: Self-pay | Admitting: Family Medicine

## 2012-06-20 ENCOUNTER — Telehealth: Payer: Self-pay | Admitting: *Deleted

## 2012-06-20 VITALS — BP 147/74 | HR 73 | Ht 68.0 in | Wt 187.5 lb

## 2012-06-20 DIAGNOSIS — K130 Diseases of lips: Secondary | ICD-10-CM

## 2012-06-20 DIAGNOSIS — R011 Cardiac murmur, unspecified: Secondary | ICD-10-CM

## 2012-06-20 DIAGNOSIS — Z0000 Encounter for general adult medical examination without abnormal findings: Secondary | ICD-10-CM

## 2012-06-20 DIAGNOSIS — I10 Essential (primary) hypertension: Secondary | ICD-10-CM

## 2012-06-20 MED ORDER — METOPROLOL TARTRATE 25 MG PO TABS: 25.0000 mg | ORAL_TABLET | Freq: Two times a day (BID) | ORAL | Status: DC

## 2012-06-20 NOTE — Telephone Encounter (Signed)
appt for 2-d echo appt 06/29/12 at 11 am. Called pt and informed of appt. PA done.  Lorenda Hatchet, Renato Battles

## 2012-06-20 NOTE — Progress Notes (Signed)
CC: Paula Reed is a 67 y.o. female here to follow up on her blood pressure. I though we would be doing a physical with pap smear today, but she would like to postpone a pap smear and will schedule that visit at another time.  HPI:  Lip pain: Pt reports feeling a sensation that her lips are burning for about one week. She has put vaseline on it and it helped some. There was no change in her life one week ago. No new meds. No rashes or fevers, no sores in her mouth.   Hypertension: Checks her BP at home sometimes and gets systolic numbers of 135, 140, 161. Didn't bring her log with her. Initially denies chest pain or shortness of breath but when asked about heart palpitations, she says she does get some about every other day when she picks up her foster child. She does get somewhat short of breath then.  Health maintenance: Pt states it has probably been >10 years since she had a colonoscopy. Does not want pap smear today, will make this appointment at another time.   ROS: See HPI  PHYSICAL EXAM: BP 147/74  Pulse 73  Ht 5\' 8"  (1.727 m)  Wt 187 lb 8 oz (85.049 kg)  BMI 28.52 kg/m2 Gen: NAD, pleasant, cooperative HEENT: no oral lesions. No sores on lips, appear dry. Heart: RRR, 2/6 systolic murmur loudest at RUSB Lungs: CTAB, NWOB Neuro: nonfocal, speech intact Ext: no LE edema

## 2012-06-20 NOTE — Patient Instructions (Addendum)
It was great to see you again today!  For your heart murmur, I am ordering an echocardiogram, which is an ultrasound of your heart.   For your blood pressure, we are adding a new medicine that you will take twice per day, every day, called metoprolol. Please keep checking your BP at home and writing down the numbers, and bring them in. If you have any dizziness or lightheadedness call the clinic.  For colon cancer screening: I recommend a colonoscopy since it has been 10 years since you had one. Follow the instructions given you on the sheet regarding how to schedule it.  For your lips: keep trying the vaseline. If your lips aren't better in a week or two, come back to be seen. If you have any fevers, rashes, or sores in your mouth, please seek medical attention immediately.  Schedule a follow up visit for 2-3 weeks so we can recheck your blood pressure. Also schedule a visit to have a pap smear done, to screen for cervical cancer.  You can always call the clinic if you have any questions or concerns.  Be well, Dr. Pollie Meyer

## 2012-06-22 DIAGNOSIS — K130 Diseases of lips: Secondary | ICD-10-CM | POA: Insufficient documentation

## 2012-06-22 NOTE — Assessment & Plan Note (Signed)
-  Gave handout on how to schedule colonoscopy (pt says it has been >10 years since she had one) -Pt will call to schedule pap smear (unsure if we can justify discontinuation for her age since we do not 10 years of adequate screening - had negative in 2009 and 2004). -Need to address mammogram & dexa scan at future visit (I thought pt had mammogram scheduled but it appears this has not been done)

## 2012-06-22 NOTE — Assessment & Plan Note (Signed)
BP remains elevated on losartan 100mg  daily and amlodipine 10mg  daily. Will add metoprolol 25mg  BID and f/u in 2-3 weeks to recheck BP.

## 2012-06-22 NOTE — Assessment & Plan Note (Addendum)
No red flags (oral lesions, fevers, new meds, rashes). No obvious explanation on exam. Possibly secondary to cold weather. Advised she continue vaseline & f/u if it is not improved in 1-2 weeks. Given return precautions (per AVS).

## 2012-06-22 NOTE — Assessment & Plan Note (Signed)
Since patient now endorses palpitations/slight SOB with exertion, will go ahead and obtain an echo to better evaluate cardiac murmur.

## 2012-06-24 ENCOUNTER — Ambulatory Visit: Payer: Medicare Other

## 2012-06-27 ENCOUNTER — Ambulatory Visit: Payer: Medicare Other

## 2012-06-27 ENCOUNTER — Encounter: Payer: Self-pay | Admitting: Family Medicine

## 2012-06-27 NOTE — Telephone Encounter (Signed)
This encounter was created in error - please disregard.

## 2012-06-27 NOTE — Telephone Encounter (Signed)
error 

## 2012-06-29 ENCOUNTER — Ambulatory Visit (HOSPITAL_COMMUNITY)
Admission: RE | Admit: 2012-06-29 | Discharge: 2012-06-29 | Disposition: A | Discharge status: Home / Self Care | Payer: Medicare Other | Source: Ambulatory Visit | Attending: Family Medicine | Admitting: Family Medicine

## 2012-06-29 DIAGNOSIS — R011 Cardiac murmur, unspecified: Secondary | ICD-10-CM

## 2012-06-29 NOTE — Progress Notes (Signed)
  Echocardiogram 2D Echocardiogram has been performed.  CHUNG, KWONG 06/29/2012, 11:45 AM

## 2012-07-21 ENCOUNTER — Ambulatory Visit: Payer: Medicare Other

## 2012-07-22 ENCOUNTER — Ambulatory Visit: Payer: Medicare Other | Admitting: Family Medicine

## 2012-07-24 ENCOUNTER — Other Ambulatory Visit: Payer: Self-pay | Admitting: Family Medicine

## 2012-07-25 ENCOUNTER — Telehealth: Payer: Self-pay | Admitting: Family Medicine

## 2012-07-25 NOTE — Telephone Encounter (Signed)
To [facility] red team - please call pt and let them know I have refilled one month's worth of her metformin but they need to schedule an appointment to follow up on diabetes. Thanks!

## 2012-07-25 NOTE — Telephone Encounter (Signed)
Called pt and informed. .Paula Reed  

## 2012-08-09 ENCOUNTER — Other Ambulatory Visit: Payer: Self-pay | Admitting: Family Medicine

## 2012-08-09 NOTE — Telephone Encounter (Signed)
Called patient and let her know that I got a refill request on metformin, but she needs to schedule an appointment to follow up on her medical problems. She will call to schedule a follow up appointment in our clinic.  Also let her know that her echo was essentially unremarkable. She confirms to me that she has not been having chest pain or shortness of breath. Reminded her that if she has these symptoms that she needs to go to the ER to be seen. She understood these instructions.

## 2012-08-26 ENCOUNTER — Ambulatory Visit: Payer: Medicare Other

## 2012-08-31 ENCOUNTER — Other Ambulatory Visit: Payer: Self-pay | Admitting: Family Medicine

## 2012-09-07 ENCOUNTER — Ambulatory Visit: Payer: Medicare Other

## 2012-09-08 ENCOUNTER — Encounter: Payer: Self-pay | Admitting: Family Medicine

## 2012-09-08 ENCOUNTER — Ambulatory Visit (INDEPENDENT_AMBULATORY_CARE_PROVIDER_SITE_OTHER): Payer: Medicare Other | Admitting: Family Medicine

## 2012-09-08 VITALS — BP 146/56 | HR 63 | Temp 98.7°F | Ht 68.0 in | Wt 187.0 lb

## 2012-09-08 DIAGNOSIS — M25571 Pain in right ankle and joints of right foot: Secondary | ICD-10-CM | POA: Insufficient documentation

## 2012-09-08 DIAGNOSIS — M25579 Pain in unspecified ankle and joints of unspecified foot: Secondary | ICD-10-CM

## 2012-09-08 DIAGNOSIS — I10 Essential (primary) hypertension: Secondary | ICD-10-CM

## 2012-09-08 DIAGNOSIS — R51 Headache: Secondary | ICD-10-CM

## 2012-09-08 DIAGNOSIS — M25572 Pain in left ankle and joints of left foot: Secondary | ICD-10-CM

## 2012-09-08 DIAGNOSIS — E119 Type 2 diabetes mellitus without complications: Secondary | ICD-10-CM

## 2012-09-08 DIAGNOSIS — R519 Headache, unspecified: Secondary | ICD-10-CM | POA: Insufficient documentation

## 2012-09-08 LAB — POCT GLYCOSYLATED HEMOGLOBIN (HGB A1C): Hemoglobin A1C: 6.4

## 2012-09-08 MED ORDER — HYDROCHLOROTHIAZIDE 25 MG PO TABS: 25.0000 mg | ORAL_TABLET | Freq: Every day | ORAL | Status: DC

## 2012-09-08 NOTE — Patient Instructions (Signed)
It was great to see you again today!  For your blood pressure, I'm sending in a new prescription for a medicine called hydrochlorothiazide (HCTZ). You'll take it once per day.  Remember to take the metformin and metoprolol TWICE per day, once in the morning and once in the evening.  For your headaches, you can take ibuprofen as needed but you should not take it more than once a day or a few times per week. This shouldn't be a long term medicine.  You're due for a visit to your eye doctor since you have diabetes. Call them to schedule an appointment.  See the handout on how to schedule a colonoscopy.  Schedule a visit to come back in 1 month so we can recheck your blood pressure and see how you are doing. You are also due for a pap smear, so call to schedule that visit when you're ready.  You can always call with any questions or concerns.  Be well, Dr. Pollie Meyer

## 2012-09-08 NOTE — Assessment & Plan Note (Signed)
A1c 6.4 today, good control of diabetes. Will continue current management. She now knows to take the metformin as 1000mg  twice per day in morning & evening, instead of two pills in the morning. Offered XR form, but pt preferred to try taking it twice per day.  Cardiac: on statin and aspirin Renal: on ARB Foot exam: done in Dec 2013, but need to check DP pulses at next visit Eye exam: advised to pt that she see eye doctor, has been 2-3 years

## 2012-09-08 NOTE — Assessment & Plan Note (Signed)
Likely secondary to wearing high heels over the weekend. No deformities or edema noted on exam. Advised patient continue to monitor and return to clinic if ankle pain persists.

## 2012-09-08 NOTE — Progress Notes (Signed)
Name: Paula Reed Age/Sex: 67 y.o. female  HPI:  Diabetes: Last went to eye doctor 2-3 years ago. Taking metformin, although we just discovered today that she has been taking it once daily instead of twice daily (has been taking two 1000 mg pills every morning).  Blood pressure: Checks her BP at home. This morning got systolic reading of 155. Other times gets in the 140s. Says she gets diastolic #s in the 45-50 range sometimes. Did not bring in a list of BP readings. Denies swelling in her legs. Denies chest pain or SOB. Has had headache (see below). Currently taking amlodipine 10mg  daily, losartan 100mg  daily, and metoprolol 50mg  every morning. She was supposed to be taking metoprolol 25mg  BID, but has been taking two 25mg  pills in the morning instead. She says she does forget to take her medicines about twice per week.  Ankle pain: Says her feet and ankles have been hurting for the last few days, after she wore high heels over the weekend. She doesn't normally wear high heels.  Headache: has had headache every other day for over a week. The headaches are in the front of her head and last 2-3 hours. Rest improves the headache. Hasn't taken any medicine for it other than her daily baby aspirin. Denies any numbness or weakness anywhere in her body. No vision changes.   ROS: See HPI. No chest pain, SOB, lower extremity edema. Does have headache but no vision changes or focal weakness/numbness.  PMFSH:  PMH of hypertension, diabetes, hyperlipidemia.  PHYSICAL EXAM: BP 146/56  Pulse 63  Temp(Src) 98.7 F (37.1 C) (Oral)  Ht 5\' 8"  (1.727 m)  Wt 187 lb (84.823 kg)  BMI 28.44 kg/m2 Gen: NAD, pleasant, cooperative Heart: RRR, 2/6 systolic murmur present as previously auscultated Lungs: CTAB, NWOB Neuro: face symmetric. EOMI. PERRL. Grip 5/5 bilaterally. Hip flexion 5/5 bilaterally. Speech intact. Ext: no appreciable lower extremity edema bilaterally. Ankles are nontender to palpation, no  gross deformity.  ASSESSMENT/PLAN:  # Health maintenance:  -Due for mammogram, has it scheduled on May 30 -Due for pap smear (is 59 but has not had 10 years of adequate screening per available records. Would like to due one more pap, and if normal, can consider stopping). Pt did not want to do pap smear today, knows to schedule this appointment. -Has not had colonoscopy in >10 years. Handout given today on how to schedule.

## 2012-09-08 NOTE — Assessment & Plan Note (Addendum)
BP remains elevated despite taking amlodipine, losartan, and metoprolol. Unable to increase metoprolol due to pulse of 63 today. Amlodipine and losartan are at max dose. Instructed pt to take metoprolol one pill twice per day instead of two pills every morning.  After further discussion with patient about possible hx of rash reaction to chlorthalidone (seen incidentally in a progress note in [facility] records), she does not recall ever having to stop a medicine due to allergic reaction. Does not identify any allergies. It is likely safe to start a thiazide. Will start HCTZ 25mg  daily. Counseled patient to be cautious about any possible reactions to HCTZ. Precepted with Dr. Earnest Bailey who agrees with this plan.  Also advised patient to check her BP at home and write down the numbers to bring with her to clinic, as this will be helpful for titrating her BP medicine.  F/u BP check in 1 month.

## 2012-09-08 NOTE — Assessment & Plan Note (Signed)
Possibly related to elevated BP, or possibly tension headache. No deficits on neuro exam. Recommended that pt may try ibuprofen sparingly but that this should not be a chronic medication for her.

## 2012-09-23 ENCOUNTER — Ambulatory Visit: Payer: Medicare Other

## 2012-09-23 ENCOUNTER — Other Ambulatory Visit: Payer: Self-pay | Admitting: *Deleted

## 2012-09-23 MED ORDER — AMLODIPINE BESYLATE 10 MG PO TABS: ORAL_TABLET | ORAL | Status: DC

## 2012-09-23 MED ORDER — LOSARTAN POTASSIUM 100 MG PO TABS: ORAL_TABLET | ORAL | Status: DC

## 2012-09-23 NOTE — Telephone Encounter (Signed)
Requested Prescriptions   Pending Prescriptions Disp Refills  . amLODipine (NORVASC) 10 MG tablet 30 tablet 1  . losartan (COZAAR) 100 MG tablet 30 tablet 1   Please send 90 day supply Wyatt Haste, RN-BSN

## 2012-09-26 ENCOUNTER — Other Ambulatory Visit: Payer: Self-pay

## 2012-09-26 DIAGNOSIS — Z1231 Encounter for screening mammogram for malignant neoplasm of breast: Secondary | ICD-10-CM

## 2012-09-30 ENCOUNTER — Ambulatory Visit: Payer: Medicare Other | Admitting: Family Medicine

## 2012-10-20 ENCOUNTER — Other Ambulatory Visit: Payer: Self-pay | Admitting: Family Medicine

## 2012-10-24 ENCOUNTER — Ambulatory Visit: Payer: Medicare Other

## 2012-10-27 ENCOUNTER — Ambulatory Visit: Payer: Medicare Other

## 2012-10-31 ENCOUNTER — Other Ambulatory Visit: Payer: Self-pay | Admitting: Family Medicine

## 2012-11-03 ENCOUNTER — Other Ambulatory Visit: Payer: Self-pay

## 2012-11-03 ENCOUNTER — Encounter: Payer: Self-pay | Admitting: Family Medicine

## 2012-11-03 ENCOUNTER — Ambulatory Visit (INDEPENDENT_AMBULATORY_CARE_PROVIDER_SITE_OTHER): Payer: Medicare Other | Admitting: Family Medicine

## 2012-11-03 VITALS — BP 134/74 | HR 60 | Ht 68.0 in | Wt 186.5 lb

## 2012-11-03 DIAGNOSIS — M6530 Trigger finger, unspecified finger: Secondary | ICD-10-CM

## 2012-11-03 MED ORDER — IBUPROFEN 600 MG PO TABS: 600.0000 mg | ORAL_TABLET | Freq: Three times a day (TID) | ORAL | Status: DC | PRN

## 2012-11-03 NOTE — Progress Notes (Signed)
Patient ID: Paula Reed, female   DOB: 1946/03/25, 67 y.o.   MRN: [id number]   HPI:  [name] pain: Pt complains of a popping sensation in her R pinky finger. It began about 2 weeks ago. Does not recall having hit it on anything. She just woke up one day and it was doing this. It hurts all the time, but especially at night.   ROS: See HPI. + pinky pain and popping.  PMFSH: hx HTN, DM, HLD  PHYSICAL EXAM: BP 134/74  Pulse 60  Ht 5\' 8"  (1.727 m)  Wt 186 lb 8 oz (84.596 kg)  BMI 28.36 kg/m2 Gen: NAD Lungs: normal respiratory effort Neuro: grossly nonfocal, speech intact Ext: R pinky with popping to extended position when opening hand from flexed position. nontender to palpation. Pain with movement of pinky. Sensation intact. Hand is warm and well perfused. RRR on radial pulse.

## 2012-11-03 NOTE — Patient Instructions (Addendum)
It was great to see you again today!  Let's try a course of an oral anti-inflammatory medicine, called ibuprofen to see if it will help with your hand.  Wear the splint at night to keep your finger still. If you aren't feeling better in a week or two, come back to see me.  Schedule an appointment to come back and see me in the next few weeks to follow up on your chronic medical problems.  Be well, Dr. Pollie Meyer   Trigger Finger Trigger finger (digital tendinitis and stenosing tenosynovitis) is a common disorder that causes an often painful catching of the fingers or thumb. It occurs as a clicking, snapping or locking of a finger in the palm of the hand. The reason for this is that there is a problem with the tendons which flex the fingers sliding smoothly through their sheaths. The cause of this may be inflammation of the tendon and sheath, or from a thickening or nodule in the tendon. The condition may occur in any finger or a couple fingers at the same time. The cause may be overuse while doing the same activity over and over again with your hands.  Tendons are the tough cords that connect the muscles to bones. Muscles and tendons are part of the system which allows your body to move. When muscles contract in the forearm on the palm side, they pull the tendons toward the elbow and cause the fingers and thumb to bend (flex) toward the palm. These are the flexor tendons. The tendons slide through a slippery smooth membrane (synovium) which is called the tendon sheath. The sheaths have areas of tough fibrous tissues surrounding them which hold the tendons close to the bone. These are called pulleys because they work like a pulley. The first pulley is in the palm of the hand near the crease which runs across your palm. If the area of the tendon thickening is near the pulley, the tendon cannot slide smoothly through the pulley and this causes the trigger finger. The finger may lock with the finger curled  or suddenly straighten out with a snap. This is more common in patients with rheumatoid arthritis and diabetes. Left untreated, the condition may get worse to the point where the finger becomes locked in flexion, like making a fist, or less commonly locked with the finger straightened out. DIAGNOSIS  Your caregiver will easily make this diagnosis on examination. TREATMENT   Splinting for 6 to 8 weeks of time may be helpful. Use the splints as your caregiver suggests.  Heat used for twenty minutes at least four times a day followed by ice packs for twenty minutes unless directed otherwise by your caregiver may be helpful. If you find either heat or cold seems to be making the problem worse, quit using them and ask your caregiver for directions.  Cortisone injections along with splinting may speed up recovery. Several injections may be required. Cortisone may give relief after one injection.  Only take over-the-counter or prescription medicines for pain, discomfort, or fever as directed by your caregiver.  Surgery is another treatment that may be used if conservative treatments using injection and splinting does not work. Surgery can be minor without incisions (a cut does not have to be made) and can be done with a needle through the skin. No stitches are needed and most patients may return to work the same day.  Other surgical choices involve an open procedure where the surgeon opens the hand through a small  incision (cut) and cuts the pulley so the tendon can again slide smoothly. Your hand will still work fine. This small operation requires stitches and the recovery will be a little longer and the incisions will need to be protected until completely healed. You may have to limit your activities for up to 6 months.  Occupational or hand therapy may be required if there is stiffness remaining in the finger. RISKS AND COMPLICATIONS Complications are uncommon but some problems that may occur  are:  Recurrence of the trigger finger. This does not mean that the surgery was not well done. It simply means that you may have formed scar tissue following surgery that causes the problem to reoccur.  Infection which could ruin the results of the surgery and can result in a finger which is frozen and can not move normally.  Nerve injury is possible which could result in permanent numbness of one or more fingers. CARE AFTER SURGERY  Elevate your hand above your heart and use ice as instructed.  Follow instructions regarding finger motion/exercise.  Keep the surgical wound dry for at least 48 hrs or longer if instructed.  Keep your follow-up appointments.  Return to work and normal activities as instructed. SEEK IMMEDIATE MEDICAL CARE IF:  Your problems are getting worse or you do not obtain relief from the treatment. Document Released: 02/01/2004 Document Revised: 07/06/2011 Document Reviewed: 09/25/2008 [facility] Patient Information 2014 [place], [place].

## 2012-11-08 NOTE — Assessment & Plan Note (Signed)
R fifth finger exam consistent with trigger finger. Will do trial of conservative therapy with finger splint and NSAID. F/u if not improved in 1-2 weeks. Pt seen and examined by Dr. Sheffield Slider who agrees with this plan.

## 2012-11-25 ENCOUNTER — Ambulatory Visit: Payer: Medicare Other | Admitting: Family Medicine

## 2012-12-02 ENCOUNTER — Encounter: Payer: Self-pay | Admitting: Family Medicine

## 2012-12-02 ENCOUNTER — Ambulatory Visit (INDEPENDENT_AMBULATORY_CARE_PROVIDER_SITE_OTHER): Payer: PRIVATE HEALTH INSURANCE | Admitting: Family Medicine

## 2012-12-02 VITALS — BP 151/78 | HR 65 | Temp 97.9°F | Wt 185.0 lb

## 2012-12-02 DIAGNOSIS — E119 Type 2 diabetes mellitus without complications: Secondary | ICD-10-CM

## 2012-12-02 DIAGNOSIS — I10 Essential (primary) hypertension: Secondary | ICD-10-CM

## 2012-12-02 DIAGNOSIS — M6530 Trigger finger, unspecified finger: Secondary | ICD-10-CM

## 2012-12-02 LAB — POCT GLYCOSYLATED HEMOGLOBIN (HGB A1C): Hemoglobin A1C: 6.2

## 2012-12-02 MED ORDER — IBUPROFEN 600 MG PO TABS: 600.0000 mg | ORAL_TABLET | Freq: Three times a day (TID) | ORAL | Status: DC | PRN

## 2012-12-02 NOTE — Progress Notes (Signed)
Patient ID: Paula Reed, female   DOB: 1945/08/26, 67 y.o.   MRN: [id number]   HPI:  [name]: reports her pinky has still been hurting. Was diagnosed with trigger finger at last office visit and has been wearing splint some, but it is still hurting. Seems to be getting better overall, but still has pain. Ibuprofen also helped.  HTN: Is currently taking HCTZ 25mg  daily, losartan 100mg  daily, amlodipine 10mg  daily, and metoprolol. Denies chest pain, shortness of breath, lower extremity edema, or headaches. When checks her BP at home she gets around 130-150 systolic. Did NOT take her medicine yet this morning. Has not had any problems with taking HCTZ, which was added last time we addressed hypertension.  DM: taking metformin twice a day (had previously been taking a double dose once a day). Last eye exam was about 1 month ago. Will contact that Dr.'s office to have them send in the records.   ROS: See HPI  PMFSH: hx HTN, DM.  PHYSICAL EXAM: BP 151/78  Pulse 65  Temp(Src) 97.9 F (36.6 C) (Oral)  Wt 185 lb (83.915 kg)  BMI 28.14 kg/m2 Gen: NAD, pleasant, cooperative Heart: RRR, 2/6 early systolic murmur loudest at RUSB Lungs: CTAB, NWOB Neuro: grossly nonfocal, speech intact Ext: 2+ DP pulses bilateral feet. R pinky finger with good cap refill, full range of motion, nontender to palpation.  ASSESSMENT/PLAN:  # Health maintenance:  -reminded patient about scheduling mammogram, colonoscopy, and pap smear

## 2012-12-02 NOTE — Patient Instructions (Signed)
It was great to see you again today!  For the pinky finger, let's try continuing the ibuprofen and adding some tylenol to see if it helps. If it's not still getting better in 2 weeks, come back because we could try an injection, which might help.  Come back in 2 weeks for a nurse blood pressure check. Take your medicines that morning before you come.  I'll call you if we need to change anything with your diabetes medicine.  Have your eye doctor send the records to [place]. Remember to get your mammogram and schedule the colonoscopy. You're also due for a pap smear, you can schedule this at your convenience.  Otherwise come back in 3 months.  Be well, Dr. Pollie Meyer

## 2012-12-08 ENCOUNTER — Telehealth: Payer: Self-pay | Admitting: *Deleted

## 2012-12-08 NOTE — Telephone Encounter (Signed)
Left message for someone with [facility] to return call. I received a message yesterday on the physician line about this patient needing diabetic supplies, please ask exactly what she needs sent in? Paula Reed, Rodena Medin

## 2012-12-12 NOTE — Assessment & Plan Note (Signed)
Pain persists but is overall improving. Nontender on exam today. Will continue conservative management with splint and ibuprofen, add tylenol. If not improving in 2 weeks, pt will return to clinic. We could try a steroid injection, discussed that with her today, but patient prefers to avoid this if possible.

## 2012-12-12 NOTE — Assessment & Plan Note (Signed)
BP elevated, but patient did not take her medicines this morning so it is difficult to assess how well her control currently is. We added HCTZ at her last office visit. Will have her return in 2 weeks for a BP check and adjust medications as needed at that appointment.

## 2012-12-12 NOTE — Assessment & Plan Note (Addendum)
Check A1c today, will adjust mediciations as needed based on that result.   Cardiac: on statin and aspirin Renal: on ARB Eye: had done 1 month ago, patient to have that office send [place] records Foot: monofilament done December 2013, had 2+ DP pulses today.

## 2013-01-02 ENCOUNTER — Ambulatory Visit (INDEPENDENT_AMBULATORY_CARE_PROVIDER_SITE_OTHER): Payer: PRIVATE HEALTH INSURANCE | Admitting: Family Medicine

## 2013-01-02 ENCOUNTER — Encounter: Payer: Self-pay | Admitting: Family Medicine

## 2013-01-02 VITALS — BP 127/80 | HR 66 | Temp 98.0°F | Ht 68.0 in | Wt 190.0 lb

## 2013-01-02 DIAGNOSIS — M6530 Trigger finger, unspecified finger: Secondary | ICD-10-CM

## 2013-01-02 MED ORDER — METHYLPREDNISOLONE ACETATE 80 MG/ML IJ SUSP
20.0000 mg | Freq: Once | INTRAMUSCULAR | Status: AC
  Administered 2013-01-02: 20 mg via INTRALESIONAL

## 2013-01-02 NOTE — Progress Notes (Signed)
Patient ID: Paula Reed, female   DOB: 27-Jun-1945, 67 y.o.   MRN: [id number]    HPI:  [name] pain: Pt reports continued pain in her R pinky. Has been taking ibuprofen and tylenol, these have helped some. She is having to use her hand a lot. Has worn the splint. It mostly hurts at night, about 3 nights out of the week. She is taking ibuprofen BID and tylenol once daily but it makes her sleepy. The pinky doesn't tend to hurt much during the day but does do the popping during the day. She can't identify what causes it to hurt on those 3 or so nights out of the week. No fevers.   ROS: See HPI  PMFSH: hx DM, HTN  PHYSICAL EXAM: BP 127/80  Pulse 66  Temp(Src) 98 F (36.7 C) (Oral)  Ht 5\' 8"  (1.727 m)  Wt 190 lb (86.183 kg)  BMI 28.9 kg/m2 Gen: NAD Ext: R fifth finger tender with flexion of finger. No palpable tenderness or nodules. No erythema.   Trigger Finger Injection Verbal and written consent was obtained. Risks (including rare risk of infection, bleeding, damage to surrounding structures), benefits and alternatives were discussed. Prepped with alcohol swab and betadine x3. Ethyl chloride used for anesthesia. Under sterile conditions, patient injected on palmar surface of hand just proximal to crease where R fifth digit meets palm; injected directly into tendon sheath. Medication flowed freely without resistance.  Needle size: 25 gauge Injection: 1/2 cc of Lidocaine 1% and 1/4 cc of Depo-Medrol 80 mg/ml  ASSESSMENT/PLAN:  # see problem based charting -f/u in 2 months for routine medical problems

## 2013-01-02 NOTE — Patient Instructions (Signed)
It was great to see you again today!  Ice the pinky when you get home. If you develop any redness, swelling, drainage, or worsening pain, call the clinic or come in for an appointment. Continue wearing the splint and using tylenol as needed. If you don't have much relief within 1-2 weeks, return to the clinic to be seen.  Come back in 2 months to follow up on your chronic medical problems.  Be well, Dr. Pollie Meyer

## 2013-01-03 NOTE — Assessment & Plan Note (Signed)
Continued pain. Injection done today. Anticipate this will provide pt with some relief. Will have her f/u in 1-2 weeks if not still improving. Continue tylenol prn for pain. Want to avoid chronic NSAIDs.

## 2013-01-03 NOTE — Addendum Note (Signed)
Addended by: Latrelle Dodrill on: 01/03/2013 10:51 AM   Modules accepted: Level of Service

## 2013-01-18 ENCOUNTER — Other Ambulatory Visit: Payer: Self-pay | Admitting: Family Medicine

## 2013-01-31 ENCOUNTER — Ambulatory Visit (INDEPENDENT_AMBULATORY_CARE_PROVIDER_SITE_OTHER): Payer: PRIVATE HEALTH INSURANCE | Admitting: *Deleted

## 2013-01-31 DIAGNOSIS — Z23 Encounter for immunization: Secondary | ICD-10-CM

## 2013-02-02 ENCOUNTER — Other Ambulatory Visit: Payer: Self-pay | Admitting: Family Medicine

## 2013-02-02 MED ORDER — METOPROLOL TARTRATE 25 MG PO TABS: ORAL_TABLET | ORAL | Status: DC

## 2013-02-08 ENCOUNTER — Other Ambulatory Visit: Payer: Self-pay | Admitting: Family Medicine

## 2013-03-02 ENCOUNTER — Other Ambulatory Visit: Payer: Self-pay | Admitting: Family Medicine

## 2013-03-04 ENCOUNTER — Other Ambulatory Visit: Payer: Self-pay | Admitting: Family Medicine

## 2013-03-08 ENCOUNTER — Ambulatory Visit: Payer: PRIVATE HEALTH INSURANCE

## 2013-03-13 ENCOUNTER — Encounter: Payer: Self-pay | Admitting: Family Medicine

## 2013-03-13 ENCOUNTER — Ambulatory Visit (INDEPENDENT_AMBULATORY_CARE_PROVIDER_SITE_OTHER): Payer: PRIVATE HEALTH INSURANCE | Admitting: Family Medicine

## 2013-03-13 ENCOUNTER — Telehealth: Payer: Self-pay | Admitting: Family Medicine

## 2013-03-13 VITALS — BP 166/95 | HR 64 | Temp 97.9°F | Ht 68.0 in | Wt 191.0 lb

## 2013-03-13 DIAGNOSIS — M545 Low back pain, unspecified: Secondary | ICD-10-CM

## 2013-03-13 MED ORDER — IBUPROFEN 600 MG PO TABS: 600.0000 mg | ORAL_TABLET | Freq: Three times a day (TID) | ORAL | Status: DC | PRN

## 2013-03-13 NOTE — Telephone Encounter (Signed)
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  Please call her when completed.

## 2013-03-13 NOTE — Progress Notes (Signed)
  Subjective:    Patient ID: Paula Reed, female    DOB: Jul 30, 1945, 67 y.o.   MRN: [id number]  HPI  67 year old F who presents with 3 days of body aches. She has had back pain for several years, which she states has been present for years. She also notes pain in her posterior legs, behind the knees. She denies any new heavy lifting or falls. She denies previous work up for this problem.  Usually treat with hot pad and lying down. She takes ibuprofen 600 mg twice a day. When asked about her regular activities, she says "I don't do nothing." Specifically she says that she does not exercise and has not very active around the house.  Hypertension - patient has not taken her medications this morning  PMH - No hx of back surgery.  Review of Systems     Objective:   Physical Exam BP 166/95  Pulse 64  Temp(Src) 97.9 F (36.6 C) (Oral)  Ht 5\' 8"  (1.727 m)  Wt 191 lb (86.637 kg)  BMI 29.05 kg/m2  Gen: edlerly AAF, appears older than stated age, non ill, non distresed, pleasant Back:  Appearance: sciolosis no Palpation: tenderness of paraspinal muscles yes, spinous process no; pelvis no   Neuro: Bilateral strength hip flexion 5/5, hip abduction 5/5, hip adduction 5/5,  knee extension 4/5, knee flexion 4/5, dorsiflexion 4/5, plantar flexion 4/5 Reflexes: patella 1/2 Bilateral  Achilles 1/2 Bilateral Straight Leg Raise: negative Sensation to light touch intact: yes     Assessment & Plan:

## 2013-03-13 NOTE — Assessment & Plan Note (Signed)
Assessment: chronic low back pain due to physical inactivity and deconditioning; no red flags for severe radiculopathy or fracture of spinal column Plan:  - Counseled importance of regular physical exercise and would like to attend exercise classes with her sister as opposed to formal physical therapy at this time - Refill ibuprofen a day when necessary instructed to use Aspercreme as needed - Patient will follow up as needed

## 2013-03-13 NOTE — Patient Instructions (Signed)
Dear Paula Reed,  It was nice to meet you today. I'm sorry you're having pain in her back and legs. I think it is a result of being out of shape. At your age, it is very important to exercise for at least 30 minutes 3-5 times a week. Even brisk walking is something that is very helpful and safe. Attending exercise class with your sister and son is a good idea. If it is too difficult for you, or you feel unsafe earlier there, and let me know and I can make a referral to physical therapy. In the meantime, you may take ibuprofen as needed for your back pain. Also, I would use Aspercreme to rub into the muscles and they're sore. The heating pad is always a great idea  Please follow up with Dr. Pollie Meyer or me as needed.  Sincerely,   Dr. Clinton Sawyer

## 2013-03-14 NOTE — Telephone Encounter (Signed)
Form put in Dr.McIntyre's box. .Trammell Bowden  

## 2013-03-15 NOTE — Telephone Encounter (Signed)
Patient calls inquiring the status of forms she dropped off on 11/17. Advised patient that paperwork is in MD's box waiting for signature. Please call patient once completed.

## 2013-03-15 NOTE — Telephone Encounter (Signed)
Called and left detailed message that form has been completed and is ready to pick up at front desk.  Gaylene Brooks, RN

## 2013-03-20 ENCOUNTER — Other Ambulatory Visit: Payer: Self-pay | Admitting: Family Medicine

## 2013-03-21 ENCOUNTER — Telehealth: Payer: Self-pay | Admitting: Family Medicine

## 2013-03-21 NOTE — Telephone Encounter (Signed)
To [facility] red team - please call pt and let her know I have refilled her HCTZ but she needs to schedule an appointment to follow up on her blood pressure. Thanks!

## 2013-03-22 NOTE — Telephone Encounter (Signed)
Called pt. Informed. Agreed. .Paula Reed  

## 2013-04-07 ENCOUNTER — Other Ambulatory Visit: Payer: Self-pay | Admitting: Family Medicine

## 2013-05-22 ENCOUNTER — Other Ambulatory Visit: Payer: Self-pay | Admitting: Family Medicine

## 2013-05-26 ENCOUNTER — Other Ambulatory Visit: Payer: Self-pay | Admitting: Family Medicine

## 2013-05-26 NOTE — Telephone Encounter (Signed)
Called pt and instructed needs OV before any more refills given.  Paula Reed, [name], CMA

## 2013-05-26 NOTE — Telephone Encounter (Signed)
Will FWD to MD.  Chellsea Beckers L, CMA  

## 2013-05-26 NOTE — Telephone Encounter (Signed)
Pt is calling because the pharmacy is saying that she has no refills on her Metformin. Can we send this back in. jw

## 2013-05-29 NOTE — Telephone Encounter (Signed)
Will send in refill so she is not out of medicine but she should have enough (I just refilled this for her last week).

## 2013-05-30 ENCOUNTER — Ambulatory Visit: Payer: PRIVATE HEALTH INSURANCE | Admitting: Family Medicine

## 2013-06-06 ENCOUNTER — Encounter: Payer: Self-pay | Admitting: Family Medicine

## 2013-06-06 ENCOUNTER — Ambulatory Visit (INDEPENDENT_AMBULATORY_CARE_PROVIDER_SITE_OTHER): Payer: PRIVATE HEALTH INSURANCE | Admitting: Family Medicine

## 2013-06-06 VITALS — BP 138/80 | HR 90 | Ht 68.0 in | Wt 199.8 lb

## 2013-06-06 DIAGNOSIS — I10 Essential (primary) hypertension: Secondary | ICD-10-CM

## 2013-06-06 DIAGNOSIS — E119 Type 2 diabetes mellitus without complications: Secondary | ICD-10-CM

## 2013-06-06 LAB — COMPREHENSIVE METABOLIC PANEL
ALT: 12 U/L (ref 0–35)
AST: 14 U/L (ref 0–37)
Albumin: 4.3 g/dL (ref 3.5–5.2)
Alkaline Phosphatase: 79 U/L (ref 39–117)
BUN: 13 mg/dL (ref 6–23)
CO2: 26 mEq/L (ref 19–32)
Calcium: 9.7 mg/dL (ref 8.4–10.5)
Chloride: 105 mEq/L (ref 96–112)
Creat: 0.64 mg/dL (ref 0.50–1.10)
Glucose, Bld: 131 mg/dL — ABNORMAL HIGH (ref 70–99)
Potassium: 3.9 mEq/L (ref 3.5–5.3)
Sodium: 142 mEq/L (ref 135–145)
Total Bilirubin: 0.8 mg/dL (ref 0.2–1.2)
Total Protein: 6.9 g/dL (ref 6.0–8.3)

## 2013-06-06 LAB — LIPID PANEL
Cholesterol: 170 mg/dL (ref 0–200)
HDL: 50 mg/dL (ref >39)
LDL Cholesterol: 99 mg/dL (ref 0–99)
Total CHOL/HDL Ratio: 3.4 Ratio
Triglycerides: 107 mg/dL (ref <150)
VLDL: 21 mg/dL (ref 0–40)

## 2013-06-06 LAB — POCT GLYCOSYLATED HEMOGLOBIN (HGB A1C): Hemoglobin A1C: 6.6

## 2013-06-06 MED ORDER — METOPROLOL TARTRATE 25 MG PO TABS: ORAL_TABLET | ORAL | Status: DC

## 2013-06-06 MED ORDER — METFORMIN HCL 1000 MG PO TABS: ORAL_TABLET | ORAL | Status: DC

## 2013-06-06 MED ORDER — LOSARTAN POTASSIUM 100 MG PO TABS: ORAL_TABLET | ORAL | Status: DC

## 2013-06-06 MED ORDER — HYDROCHLOROTHIAZIDE 25 MG PO TABS: ORAL_TABLET | ORAL | Status: DC

## 2013-06-06 MED ORDER — ATORVASTATIN CALCIUM 40 MG PO TABS: ORAL_TABLET | ORAL | Status: DC

## 2013-06-06 MED ORDER — AMLODIPINE BESYLATE 10 MG PO TABS: ORAL_TABLET | ORAL | Status: DC

## 2013-06-06 NOTE — Patient Instructions (Signed)
It was a pleasure to see you today.  Your blood pressure today is well controlled.   We are checking labs today for your cholesterol, kidneys, and sugar control.  I will contact you either by mail or phone [phone]) with the results.  Next follow up visit with Dr Pollie MeyerMcIntyre in 3 to 6 months or sooner as needed.

## 2013-06-07 ENCOUNTER — Encounter: Payer: Self-pay | Admitting: Family Medicine

## 2013-06-07 NOTE — Progress Notes (Signed)
   Subjective:    Patient ID: Paula Reed, female    DOB: [birth year], 68 y.o.   MRN: [id number]  HPI Patient here for follow up of hypertension.  She reports feeling well, other than the chronic low back pain that she has had for a long time.    Denies chest pain, dyspnea, cough.  Reviewed medications, she is taking as prescribed.    Review of Systems     Objective:   Physical Exam Well appearing, no apparent distress.  HEENT Neck supple. No cervical adenopathy  COR Regular S1S2, no extra sounds PULM CLear bilaterally, no rales or wheezes.        Assessment & Plan:

## 2013-06-07 NOTE — Assessment & Plan Note (Signed)
For A1C to be done today.  Follow up with primary physician for quarterly follow up.

## 2013-06-07 NOTE — Assessment & Plan Note (Signed)
Hypertension well controlled on current medication regimen.  Patient has not had a metabolic panel/renal function in over 1 year, to reorder these today as we refill her medications. For follow up with her primary physician.

## 2013-06-19 ENCOUNTER — Telehealth: Payer: Self-pay | Admitting: Home Health Services

## 2013-06-19 NOTE — Telephone Encounter (Signed)
LM for patient to call and schedule medicare annual wellness visit with Rosalita ChessmanSuzanne. 60 min appointment

## 2013-06-21 ENCOUNTER — Other Ambulatory Visit: Payer: Self-pay | Admitting: Family Medicine

## 2013-07-06 ENCOUNTER — Encounter: Payer: Self-pay | Admitting: Family Medicine

## 2013-07-06 ENCOUNTER — Ambulatory Visit (INDEPENDENT_AMBULATORY_CARE_PROVIDER_SITE_OTHER): Payer: PRIVATE HEALTH INSURANCE | Admitting: Family Medicine

## 2013-07-06 VITALS — BP 128/56 | HR 75 | Temp 98.9°F | Ht 68.0 in | Wt 201.0 lb

## 2013-07-06 DIAGNOSIS — M549 Dorsalgia, unspecified: Secondary | ICD-10-CM

## 2013-07-06 MED ORDER — CYCLOBENZAPRINE HCL 7.5 MG PO TABS: 7.5000 mg | ORAL_TABLET | Freq: Two times a day (BID) | ORAL | Status: DC | PRN

## 2013-07-06 MED ORDER — TRAMADOL HCL 50 MG PO TABS: 50.0000 mg | ORAL_TABLET | Freq: Four times a day (QID) | ORAL | Status: DC | PRN

## 2013-07-06 NOTE — Progress Notes (Signed)
   Subjective:    Patient ID: Paula Reed, female    DOB: May 20, 1945, 68 y.o.   MRN: [id number]  HPI  Back Pain: Patient reports back pain for 2 days in the lower part of her back. She states that she started having back pain a couple months ago once her husband bought a truck that is elevated. She states riding in the truck and bouncing hurts her lower back. She has no injury prior to her back. She rates her back is an 8/10 pain that increases with standing to sitting position. Patient has taken Tylenol for pain and this helped a small amount. She's not use a heating pad. Pain is an aching does not radiate. She denies fevers, abdominal pain, flank pain or dysuria.   Review of Systems Negative, with the exception of above mentioned in HPI     Objective:   Physical Exam BP 128/56  Pulse 75  Temp(Src) 98.9 F (37.2 C) (Oral)  Ht 5\' 8"  (1.727 m)  Wt 201 lb (91.173 kg)  BMI 30.57 kg/m2 Gen: NAD.  CV: RRR  Chest: CTAB, no wheeze or crackles Abd: Soft. NTND. BS present. No Masses palpated.  MSK: 5 out of 5 muscle strength upper and lower extremities. Lumbar area without erythema or swelling. Patient with very mild tissue texture changes. No deformity back. Tenderness to palpation over muscle bellies of lumbar down to sacrum. No bony tenderness Ext: No erythema. No edema.  Skin: No rashes, purpura or petechiae. No ecchymosis or erythema over lumbar area.  Neuro:Normal gait. PERLA. EOMi. Alert. Grossly intact.

## 2013-07-06 NOTE — Assessment & Plan Note (Signed)
Patient here with likely strained muscles of her lower back.  Will treat with Flexeril and tramadol. Encouraged heating pad application 20 minutes on 20 minutes off. Patient encouraged to followup with her PCP if symptoms do not improve or per week

## 2013-07-06 NOTE — Patient Instructions (Signed)
Lumbosacral Strain Lumbosacral strain is a strain of any of the parts that make up your lumbosacral vertebrae. Your lumbosacral vertebrae are the bones that make up the lower third of your backbone. Your lumbosacral vertebrae are held together by muscles and tough, fibrous tissue (ligaments).  CAUSES  A sudden blow to your back can cause lumbosacral strain. Also, anything that causes an excessive stretch of the muscles in the low back can cause this strain. This is typically seen when people exert themselves strenuously, fall, lift heavy objects, bend, or crouch repeatedly. RISK FACTORS  Physically demanding work.  Participation in pushing or pulling sports or sports that require sudden twist of the back (tennis, golf, baseball).  Weight lifting.  Excessive lower back curvature.  Forward-tilted pelvis.  Weak back or abdominal muscles or both.  Tight hamstrings. SIGNS AND SYMPTOMS  Lumbosacral strain may cause pain in the area of your injury or pain that moves (radiates) down your leg.  DIAGNOSIS Your health care provider can often diagnose lumbosacral strain through a physical exam. In some cases, you may need tests such as X-ray exams.  TREATMENT  Treatment for your lower back injury depends on many factors that your clinician will have to evaluate. However, most treatment will include the use of anti-inflammatory medicines. HOME CARE INSTRUCTIONS   Avoid hard physical activities (tennis, racquetball, waterskiing) if you are not in proper physical condition for it. This may aggravate or create problems.  If you have a back problem, avoid sports requiring sudden body movements. Swimming and walking are generally safer activities.  Maintain good posture.  Maintain a healthy weight.  For acute conditions, you may put ice on the injured area.  Put ice in a plastic bag.  Place a towel between your skin and the bag.  Leave the ice on for 20 minutes, 2 3 times a day.  When the  low back starts healing, stretching and strengthening exercises may be recommended. SEEK MEDICAL CARE IF:  Your back pain is getting worse.  You experience severe back pain not relieved with medicines. SEEK IMMEDIATE MEDICAL CARE IF:   You have numbness, tingling, weakness, or problems with the use of your arms or legs.  There is a change in bowel or bladder control.  You have increasing pain in any area of the body, including your belly (abdomen).  You notice shortness of breath, dizziness, or feel faint.  You feel sick to your stomach (nauseous), are throwing up (vomiting), or become sweaty.  You notice discoloration of your toes or legs, or your feet get very cold. MAKE SURE YOU:   Understand these instructions.  Will watch your condition.  Will get help right away if you are not doing well or get worse. Document Released: 01/21/2005 Document Revised: 02/01/2013 Document Reviewed: 11/30/2012 ExitCare Patient Information 2014 [employer].  

## 2013-07-12 ENCOUNTER — Encounter: Payer: Self-pay | Admitting: Family Medicine

## 2013-07-12 ENCOUNTER — Ambulatory Visit (INDEPENDENT_AMBULATORY_CARE_PROVIDER_SITE_OTHER): Payer: PRIVATE HEALTH INSURANCE | Admitting: Family Medicine

## 2013-07-12 VITALS — BP 153/64 | HR 88 | Temp 98.1°F | Ht 68.0 in | Wt 199.4 lb

## 2013-07-12 DIAGNOSIS — M549 Dorsalgia, unspecified: Secondary | ICD-10-CM

## 2013-07-12 MED ORDER — CYCLOBENZAPRINE HCL 5 MG PO TABS: 5.0000 mg | ORAL_TABLET | Freq: Three times a day (TID) | ORAL | Status: DC | PRN

## 2013-07-12 MED ORDER — TRAMADOL HCL 50 MG PO TABS: 50.0000 mg | ORAL_TABLET | Freq: Three times a day (TID) | ORAL | Status: DC | PRN

## 2013-07-12 MED ORDER — MELOXICAM 15 MG PO TABS: 15.0000 mg | ORAL_TABLET | Freq: Every day | ORAL | Status: DC

## 2013-07-12 NOTE — Assessment & Plan Note (Signed)
Patient here with likely mechanical low back pain secondary to paraspinal muscle spasm.  Agree with Dr. Claiborne BillingsKuneff, explained to pt that prognosis results in complete resolution with conservative management in about 95% of cases at 3 months.  Continue with Flexeril PRN, will switch advil to mobic and f/u in 3-4 weeks to see how doing.  For now, ice to the area 2-3 x per day for 20 minutes and heat if going to perform rehabilitation exercises prior to the actual exercises.  Call w/ any questions, red flags explained requiring further w/u.

## 2013-07-12 NOTE — Patient Instructions (Signed)
Ms. Dareen Pianonderson, it was nice seeing you today.  Please ice the area 2-3 x per day for 20 minutes for the next several days and you can use heat (heating pad for 20 minutes) prior to any activity.  Please take the flexeril as needed and please take the mobic one time per day in the morning for the next 10-14 days.  If you have any current bowel/bladder problems, fever, chills, unintentional weight loss, night time awakenings secondary to pain, weakness in one or both legs, please call or go to the urgent care/emergency room.  Thanks, Dr. Paulina FusiHess

## 2013-07-12 NOTE — Progress Notes (Signed)
   Subjective:    Patient ID: Paula Reed, female    DOB: 1946-04-13, 68 y.o.   MRN: [id number]  Back Pain    Back Pain: Patient reports back pain for 8 days in the lower part of her back. She states that she started having back pain a couple months ago once her husband bought a truck that is elevated. She states riding in the truck and bouncing hurts her lower back. She has no injury prior to her back. She rates her back is an 5/10 pain that increases with standing to sitting position, worse with any type of back extension. Patient has taken ibuprofen 600 mg 3 times per day but not the tramadol or flexeril because of cost? Pt denies any current bowel/bladder problems, fever, chills, unintentional weight loss, night time awakenings secondary to pain, weakness in one or both legs.  Has not used heating pad or ice to the area at this point.      Review of Systems  Musculoskeletal: Positive for back pain.   Negative, with the exception of above mentioned in HPI     Objective:   Physical Exam BP 153/64  Pulse 88  Temp(Src) 98.1 F (36.7 C) (Oral)  Ht 5\' 8"  (1.727 m)  Wt 199 lb 6.4 oz (90.447 kg)  BMI 30.33 kg/m2 Gen: NAD.  CV: RRR  Chest: CTAB, no wheeze or crackles Abd: Soft. NTND. BS present. No Masses palpated.  MSK: 5 out of 5 muscle strength upper and lower extremities. Lumbar area without erythema, but hypertonicity of the paraspinal lumbar musculature b/l.    No deformity back. Tenderness to palpation over muscle bellies of lumbar down to sacrum. No bony tenderness in the midline.  ROM limited in forward and backward flexion, SB/R nml.  +2 DTR B/L LE and sitting leg raise negative.   Ext: No erythema. No edema.  Skin: No rashes, purpura or petechiae. No ecchymosis or erythema over lumbar area.  Neuro:Normal gait. PERLA. EOMi. Alert. Grossly intact.  Neurovascularly intact B/L LE w/o deficits

## 2013-08-15 ENCOUNTER — Ambulatory Visit (INDEPENDENT_AMBULATORY_CARE_PROVIDER_SITE_OTHER): Payer: PRIVATE HEALTH INSURANCE | Admitting: Home Health Services

## 2013-08-15 VITALS — BP 146/78 | HR 69 | Temp 97.2°F | Ht 68.0 in | Wt 198.0 lb

## 2013-08-15 DIAGNOSIS — Z0000 Encounter for general adult medical examination without abnormal findings: Secondary | ICD-10-CM

## 2013-08-15 DIAGNOSIS — Z23 Encounter for immunization: Secondary | ICD-10-CM

## 2013-08-15 MED ORDER — PNEUMOCOCCAL VAC POLYVALENT 25 MCG/0.5ML IJ INJ
0.5000 mL | INJECTION | INTRAMUSCULAR | Status: DC
Start: 2013-08-16 — End: 2013-08-15

## 2013-08-17 ENCOUNTER — Encounter: Payer: Self-pay | Admitting: Home Health Services

## 2013-08-17 NOTE — Progress Notes (Signed)
Patient here for annual wellness visit, patient reports: Risk Factors/Conditions needing evaluation or treatment: Pt does not have any new risk factors that need evaluation.  Home Safety: Pt lives with husband and 3 foster children.  Pt reports having smoke detectors and adaptive equipment in bathroom. Other Information: Corrective lens: Pt wears daily corrective lens.  Pt has annual eye exams. Dentures: Pt has full dentures. Memory: Pt denies memory problems. Patient's Mini Mental Score (recorded in doc. flowsheet): 28 Bladder:  Pt reports no problems with bladder control. BMI/Exercise:  We discussed BMI and strategies for weight loss including portion control and starting a regular exercise routine.  Pt reports no regular exercise at this time. Med Adherence:  We discussed importance of taking medications daily for htn, dm and cholesterol.  Pt reports 0 missed days in the past week. ADL/IADL: Pt reports independence in all functions.  Balance/Gait: Pt reports 0 falls in past 12 months.  We discussed home safety and fall prevention.   Colonoscopy:  We discussed importance of screening and gave information on how to schedule the appointment. Mammography:  We discussed importance of screening and gave information on how to schedule the appointment.  Foot exam:  Pt declined foot exam today.    Annual Wellness Visit Requirements Recorded Today In  Medical, family, social history Past Medical, Family, Social History Section  Current providers Care team  Current medications Medications  Wt, BP, Ht, BMI Vital signs  Hearing assessment (welcome visit) Hearing/vision  Tobacco, alcohol, illicit drug use History  ADL Nurse Assessment  Depression Screening Nurse Assessment  Cognitive impairment Nurse Assessment  Mini Mental Status Document Flowsheet  Fall Risk Fall/Depression  Home Safety Progress Note  End of Life Planning (welcome visit) Social Documentation  Medicare preventative services  Progress Note  Risk factors/conditions needing evaluation/treatment Progress Note  Personalized health advice Patient Instructions, goals, letter  Diet & Exercise Social Documentation  Emergency Contact Social Documentation  Seat Belts Social Documentation  Sun exposure/protection Social Documentation

## 2013-08-18 ENCOUNTER — Encounter: Payer: Self-pay | Admitting: Home Health Services

## 2013-08-18 NOTE — Progress Notes (Signed)
Patient ID: Paula Reed, female   DOB: [birth year], 68 y.o.   MRN: [id number]  I have reviewed this visit and discussed with Arlys JohnSuzanne Lineberry and agree with her documentation.  Latrelle DodrillBrittany J Gaurav Baldree, MD

## 2013-08-24 ENCOUNTER — Ambulatory Visit: Payer: PRIVATE HEALTH INSURANCE

## 2013-09-01 ENCOUNTER — Other Ambulatory Visit: Payer: Self-pay | Admitting: Family Medicine

## 2013-09-05 ENCOUNTER — Other Ambulatory Visit: Payer: Self-pay | Admitting: Family Medicine

## 2013-09-07 ENCOUNTER — Ambulatory Visit: Payer: PRIVATE HEALTH INSURANCE

## 2013-09-12 ENCOUNTER — Ambulatory Visit
Admission: RE | Admit: 2013-09-12 | Discharge: 2013-09-12 | Disposition: A | Discharge status: Home / Self Care | Payer: PRIVATE HEALTH INSURANCE | Source: Ambulatory Visit

## 2013-09-12 ENCOUNTER — Ambulatory Visit: Payer: PRIVATE HEALTH INSURANCE

## 2013-09-12 DIAGNOSIS — Z1231 Encounter for screening mammogram for malignant neoplasm of breast: Secondary | ICD-10-CM

## 2013-09-20 ENCOUNTER — Ambulatory Visit: Payer: PRIVATE HEALTH INSURANCE

## 2013-10-24 ENCOUNTER — Ambulatory Visit: Payer: PRIVATE HEALTH INSURANCE | Admitting: Family Medicine

## 2013-11-09 ENCOUNTER — Telehealth: Payer: Self-pay | Admitting: Family Medicine

## 2013-11-09 NOTE — Telephone Encounter (Signed)
[place] from [facility] calls to verify that forms for diabetic supplies have been received by Dr. Pollie MeyerMcIntyre. Please call back to verify at [phone].

## 2013-11-09 NOTE — Telephone Encounter (Signed)
Forward to Dr Pollie MeyerMcIntyre, Do you know anything about these forms.Paula Reed, Rodena Medinobert Lee

## 2013-11-10 NOTE — Telephone Encounter (Signed)
Pt should contact us directly if she wants diabetic supplies. I do not fill out forms sent directly to us by companies unless the pt specifically contacts us. Please call company to explain.  Latrelle DodrillBrittany J Larrisha Babineau, MD

## 2013-11-15 ENCOUNTER — Ambulatory Visit (INDEPENDENT_AMBULATORY_CARE_PROVIDER_SITE_OTHER): Payer: PRIVATE HEALTH INSURANCE | Admitting: Family Medicine

## 2013-11-15 VITALS — BP 136/68 | HR 67 | Temp 98.0°F | Ht 68.0 in | Wt 197.2 lb

## 2013-11-15 DIAGNOSIS — M545 Low back pain, unspecified: Secondary | ICD-10-CM

## 2013-11-15 DIAGNOSIS — I10 Essential (primary) hypertension: Secondary | ICD-10-CM

## 2013-11-15 MED ORDER — TRAMADOL HCL 50 MG PO TABS: 50.0000 mg | ORAL_TABLET | Freq: Three times a day (TID) | ORAL | Status: DC | PRN

## 2013-11-15 MED ORDER — CYCLOBENZAPRINE HCL 10 MG PO TABS: 10.0000 mg | ORAL_TABLET | Freq: Three times a day (TID) | ORAL | Status: DC | PRN

## 2013-11-15 NOTE — Assessment & Plan Note (Signed)
Flare of paraspinous muscle spasm. No red flags. Plan: -increase flexeril to 10mg  TID, counseled on risks of sedation -continue tramadol, refilled today -continue mobic, did not need refill today -try heating pad, continue icing the area prn  F/u in a few weeks to evaluate for improvement, or sooner if not improved.

## 2013-11-15 NOTE — Assessment & Plan Note (Signed)
Well-controlled, continue current regimen 

## 2013-11-15 NOTE — Progress Notes (Signed)
Patient ID: Paula Reed, female   DOB: 1946/02/16, 68 y.o.   MRN: [id number]  HPI:  Pt presents today to discuss back pain.  Back pain - located in mid lower back x 3 days. No injury. Pain does not radiate anywhere. It just started hurting all of a sudden. Denies fevers, weakness/numbness/tingling in legs, bowel or bladder dysfunction, crotch numbness. Has been using tramadol and mobic which have helped some. Ice also has helped some. The pain is located in the muscles on either side of her low back, not as much in the middle over her spine. No rashes on her back.  HTN - checks BP at home and gets 120's-140's systolic. Currently taking amlodipine 10mg  daily, HCTZ 25 mg daily, losartan 100mg  daily, metoprolol 25mg  BID. Denies chest pain, SOB, lower extremity edema, syncope, vision changes, headaches.  ROS: See HPI  PMFSH: T2DM, HTN, HLD  PHYSICAL EXAM: BP 136/68  Pulse 67  Temp(Src) 98 F (36.7 C) (Oral)  Ht 5\' 8"  (1.727 m)  Wt 197 lb 3.2 oz (89.449 kg)  BMI 29.99 kg/m2 Gen: NAD HEENT: NCAT Heart: RRR, no murmurs Lungs: CTAB, NWOB Neuro: grossly nonfocal, speech normal  Ext: full strength in bilat lower extremities, no patellar hyperreflexia bilaterally, No appreciable lower extremity edema bilaterally  Back: lumbar paraspinous muscles tender to palpation, active spasm. Decreased ROM with spine flexion and especially with extension. Decreased axial rotation ROM as well. No midline tenderness.  ASSESSMENT/PLAN:  See problem based charting for additional assessment/plan.  FOLLOW UP: F/u in 3-4 weeks for diabetes, f/u sooner if back pain not improving.  GrenadaBrittany J. Pollie MeyerMcIntyre, MD [facility]

## 2013-11-15 NOTE — Patient Instructions (Addendum)
Use flexeril up to three times daily as needed Be careful because this may make you very sleepy Continue tramadol - I refilled both of these Continue mobic as well Try ice or heat See handout below  Follow up with me in 3-4 weeks for your diabetes. Return sooner if the back is not feeling better.  Be well, Dr. Pollie MeyerMcIntyre    Lumbosacral Strain Lumbosacral strain is a strain of any of the parts that make up your lumbosacral vertebrae. Your lumbosacral vertebrae are the bones that make up the lower third of your backbone. Your lumbosacral vertebrae are held together by muscles and tough, fibrous tissue (ligaments).  CAUSES  A sudden blow to your back can cause lumbosacral strain. Also, anything that causes an excessive stretch of the muscles in the low back can cause this strain. This is typically seen when people exert themselves strenuously, fall, lift heavy objects, bend, or crouch repeatedly. RISK FACTORS  Physically demanding work.  Participation in pushing or pulling sports or sports that require a sudden twist of the back (tennis, golf, baseball).  Weight lifting.  Excessive lower back curvature.  Forward-tilted pelvis.  Weak back or abdominal muscles or both.  Tight hamstrings. SIGNS AND SYMPTOMS  Lumbosacral strain may cause pain in the area of your injury or pain that moves (radiates) down your leg.  DIAGNOSIS Your health care provider can often diagnose lumbosacral strain through a physical exam. In some cases, you may need tests such as X-ray exams.  TREATMENT  Treatment for your lower back injury depends on many factors that your clinician will have to evaluate. However, most treatment will include the use of anti-inflammatory medicines. HOME CARE INSTRUCTIONS   Avoid hard physical activities (tennis, racquetball, waterskiing) if you are not in proper physical condition for it. This may aggravate or create problems.  If you have a back problem, avoid sports  requiring sudden body movements. Swimming and walking are generally safer activities.  Maintain good posture.  Maintain a healthy weight.  For acute conditions, you may put ice on the injured area.  Put ice in a plastic bag.  Place a towel between your skin and the bag.  Leave the ice on for 20 minutes, 2-3 times a day.  When the low back starts healing, stretching and strengthening exercises may be recommended. SEEK MEDICAL CARE IF:  Your back pain is getting worse.  You experience severe back pain not relieved with medicines. SEEK IMMEDIATE MEDICAL CARE IF:   You have numbness, tingling, weakness, or problems with the use of your arms or legs.  There is a change in bowel or bladder control.  You have increasing pain in any area of the body, including your belly (abdomen).  You notice shortness of breath, dizziness, or feel faint.  You feel sick to your stomach (nauseous), are throwing up (vomiting), or become sweaty.  You notice discoloration of your toes or legs, or your feet get very cold. MAKE SURE YOU:   Understand these instructions.  Will watch your condition.  Will get help right away if you are not doing well or get worse. Document Released: 01/21/2005 Document Revised: 04/18/2013 Document Reviewed: 11/30/2012 [facility] Patient Information 2015 [place] [place]. This information is not intended to replace advice given to you by your health care provider. Make sure you discuss any questions you have with your health care provider.

## 2014-01-03 ENCOUNTER — Ambulatory Visit: Payer: PRIVATE HEALTH INSURANCE | Admitting: Family Medicine

## 2014-01-25 ENCOUNTER — Telehealth: Payer: Self-pay | Admitting: *Deleted

## 2014-01-25 NOTE — Telephone Encounter (Signed)
I will not be approving this request. This is fraudulent as the patient has not seen me to speak about her pain. Requests for pain medication should come from the patient and should be addressed in an office visit.  Please inform John from [facility].  Latrelle DodrillBrittany J McIntyre, MD

## 2014-01-25 NOTE — Telephone Encounter (Signed)
John informed of message from MD.

## 2014-01-25 NOTE — Telephone Encounter (Signed)
Paula Reed from [facility] states that have sent several request for MD approval on a "compund medication cream for pain management"  Wants to know if MD has received.  Please give him a call back with decision or questions.  Paula Reed, Paula Reed

## 2014-02-17 ENCOUNTER — Other Ambulatory Visit: Payer: Self-pay | Admitting: Family Medicine

## 2014-02-19 ENCOUNTER — Telehealth: Payer: Self-pay | Admitting: Family Medicine

## 2014-02-19 NOTE — Telephone Encounter (Signed)
[facility] - please call pt and let her know I have refilled her BP meds but she needs to schedule an appointment to follow up on her diabetes. Thanks!  Latrelle DodrillBrittany J Zlaty Alexa, MD

## 2014-02-20 ENCOUNTER — Telehealth: Payer: Self-pay | Admitting: Home Health Services

## 2014-02-20 ENCOUNTER — Encounter: Payer: Self-pay | Admitting: Home Health Services

## 2014-02-20 NOTE — Telephone Encounter (Signed)
Pt informed. [place], Dese[place] CMA 

## 2014-02-20 NOTE — Telephone Encounter (Signed)
[facility].  Arnetha CourserBetty Kiester (Jan 05, 2046)   Metformin 1000 mg (94%)   Losartan 100 mg (66%) o Failed measure for 2015   Atorvastatin 40 mg (66%) o Failed measure for 2015   ACTION ITEMS:  o Unable to LM (voicemail full) schedule follow up before 04/27/14 to discuss medication adherence. Sent letter

## 2014-03-20 ENCOUNTER — Other Ambulatory Visit: Payer: Self-pay | Admitting: Family Medicine

## 2014-03-21 ENCOUNTER — Other Ambulatory Visit (HOSPITAL_COMMUNITY): Payer: Self-pay | Admitting: Unknown Physician Specialty

## 2014-03-21 ENCOUNTER — Ambulatory Visit (INDEPENDENT_AMBULATORY_CARE_PROVIDER_SITE_OTHER): Payer: PRIVATE HEALTH INSURANCE | Admitting: Family Medicine

## 2014-03-21 ENCOUNTER — Encounter: Payer: Self-pay | Admitting: Family Medicine

## 2014-03-21 VITALS — BP 134/60 | HR 60 | Temp 98.2°F | Ht 68.0 in | Wt 177.7 lb

## 2014-03-21 DIAGNOSIS — I10 Essential (primary) hypertension: Secondary | ICD-10-CM

## 2014-03-21 DIAGNOSIS — R0989 Other specified symptoms and signs involving the circulatory and respiratory systems: Secondary | ICD-10-CM

## 2014-03-21 DIAGNOSIS — E119 Type 2 diabetes mellitus without complications: Secondary | ICD-10-CM

## 2014-03-21 LAB — POCT GLYCOSYLATED HEMOGLOBIN (HGB A1C): Hemoglobin A1C: 6

## 2014-03-21 NOTE — Progress Notes (Signed)
Patient ID: Paula Reed, female   DOB: February 10, 1946, 68 y.o.   MRN: [id number]  HPI:  Diabetes: currently taking metformin 1000mg  BID. Last eye exam was over 1 year ago. No problems with her feet.   Hypertension: Checks BP at home, gets 140-160's if mad. When not mad states her BP is okay. See below ROS.  Rash: has had for one week. Was really bad before, now better. Was all over arms, now better. No sores in mouth or genitals. No fevers, tick exposures. No new medicines or foods. No one else has the rash. Has been itchy.  ROS: See HPI. No CP or SOB. No swelling.  PMFSH: hx T2DM, HLD, HTN  PHYSICAL EXAM: BP 134/60 mmHg  Pulse 60  Temp(Src) 98.2 F (36.8 C) (Oral)  Ht 5\' 8"  (1.727 m)  Wt 177 lb 11.2 oz (80.604 kg)  BMI 27.03 kg/m2 Gen: NAD HEENT: NCAT. Bruit auscultated over R carotid area. Heart: RRR. Slight 2/6 systolic murmur loudest LUSB. Lungs: CTAB NWOB Neuro: grossly nonfocal speech normal Ext: No appreciable lower extremity edema bilaterally  Skin: scattered occasional maculopapular rash on arms, healing. No signs of superinfection. Diabetic foot exam: 2+ DP pulses bilat, normal monofilament testing bilaterally. No lesions or significant calluses.  ASSESSMENT/PLAN:  Diabetes mellitus, type II Well controlled with A1c of 6.0. Continue metformin. F/u in 3 mos.  Cardiac: on aspirin, statin Renal: on ARB Eye: refer for eye exam Foot: normal foot exam done today   Hypertension Well controlled. Continue current regimen.   Bruit of right carotid artery Auscultated on exam today. Will order doppler of carotid arteries to ensure no significant stenosis requiring intervention.  Rash: etiology not clear but improving. No red flags. Continue topical itch cream as needed. F/u if not improving.  FOLLOW UP: F/u in 3 months for chronic medical problems.  GrenadaBrittany J. Pollie MeyerMcIntyre, MD [facility]

## 2014-03-21 NOTE — Patient Instructions (Signed)
For diabetes: Continue current medicines Referring you to eye doctor  For blood pressure: Continue current medicines  For rash: Not sure what this is. Seems to be getting better. Continue itch cream as needed. Return if worsening.  Checking ultrasound of your neck since I hear an extra sound there.  I refilled all your medicines.  Follow up in 3 months.  Be well, Dr. Pollie MeyerMcIntyre

## 2014-03-26 ENCOUNTER — Ambulatory Visit (HOSPITAL_COMMUNITY): Payer: PRIVATE HEALTH INSURANCE | Attending: Family Medicine

## 2014-03-26 DIAGNOSIS — R0989 Other specified symptoms and signs involving the circulatory and respiratory systems: Secondary | ICD-10-CM | POA: Insufficient documentation

## 2014-03-26 NOTE — Assessment & Plan Note (Signed)
Auscultated on exam today. Will order doppler of carotid arteries to ensure no significant stenosis requiring intervention.

## 2014-03-26 NOTE — Assessment & Plan Note (Signed)
Well-controlled.  Continue current regimen. 

## 2014-03-26 NOTE — Assessment & Plan Note (Signed)
Well controlled with A1c of 6.0. Continue metformin. F/u in 3 mos.  Cardiac: on aspirin, statin Renal: on ARB Eye: refer for eye exam Foot: normal foot exam done today

## 2014-05-07 ENCOUNTER — Other Ambulatory Visit: Payer: Self-pay | Admitting: Family Medicine

## 2014-05-17 ENCOUNTER — Other Ambulatory Visit: Payer: Self-pay | Admitting: Family Medicine

## 2014-06-09 ENCOUNTER — Other Ambulatory Visit: Payer: Self-pay | Admitting: Family Medicine

## 2014-07-12 ENCOUNTER — Encounter: Payer: Self-pay | Admitting: Family Medicine

## 2014-07-12 ENCOUNTER — Ambulatory Visit (INDEPENDENT_AMBULATORY_CARE_PROVIDER_SITE_OTHER): Payer: Medicare Other | Admitting: Family Medicine

## 2014-07-12 VITALS — BP 137/83 | HR 87 | Temp 97.9°F | Ht 68.0 in | Wt 185.5 lb

## 2014-07-12 DIAGNOSIS — R21 Rash and other nonspecific skin eruption: Secondary | ICD-10-CM | POA: Diagnosis not present

## 2014-07-12 MED ORDER — DOXYCYCLINE HYCLATE 100 MG PO TABS: 100.0000 mg | ORAL_TABLET | Freq: Two times a day (BID) | ORAL | Status: DC

## 2014-07-12 NOTE — Patient Instructions (Signed)
Take doxycycline 100mg  twice a day for 1 week Warm compresses to your left arm 3 times per day Return in 1-2 weeks to be rechecked. We can talk about diabetes at that visit If they get worse in the meantime, please return sooner so we can reexamine them

## 2014-07-12 NOTE — Progress Notes (Signed)
Patient ID: Paula Reed, female   DOB: 10/22/1945, 69 y.o.   MRN: [id number]  HPI:  Rash: Patient reports that she's had a rash on her arms for about 2 weeks. The rash is not particularly itchy or painful. She denies any fever, take exposures, new medicines or foods. Does not have any lesions in her mouth her genitals. 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 They have sat outside on the porch on occasion.  ROS: See HPI. Note that visit was scheduled for back pain, but patient denies having any back pain at this time and does not want to discuss this today.  PMFSH: Diabetes, hypertension, hyperlipidemia  PHYSICAL EXAM: BP 137/83 mmHg  Pulse 87  Temp(Src) 97.9 F (36.6 C) (Oral)  Ht 5\' 8"  (1.727 m)  Wt 185 lb 8 oz (84.142 kg)  BMI 28.21 kg/m2 Gen: NAD, pleasant, cooperative Skin: Scattered papular rash on arms in various stages of healing. There is one lesion on the inner left arm which is erythematous and indurated approximately 1.5 cm. There is a white head on the lesion. There is no fluctuance. It is not draining. Palms are spared. No oral involvement.  ASSESSMENT/PLAN:  Rash: Etiology not clear. No red flags. One of the lesions appears purulent and likely infected. I will prescribe doxycycline for 7 days for patient to take. She will use a warm compress on this lesion. She will return in 1-2 weeks to be rechecked, and a follow-up on her diabetes. Discussed reasons to return sooner in the meantime.  FOLLOW UP: F/u in 1-2 weeks for rash and diabetes  GrenadaBrittany J. Pollie MeyerMcIntyre, MD [facility]

## 2014-07-16 NOTE — Progress Notes (Signed)
I was preceptor the day of this visit.   

## 2014-07-19 ENCOUNTER — Ambulatory Visit: Payer: Medicaid Other | Admitting: Family Medicine

## 2014-08-03 ENCOUNTER — Ambulatory Visit (INDEPENDENT_AMBULATORY_CARE_PROVIDER_SITE_OTHER): Payer: Medicare Other | Admitting: Family Medicine

## 2014-08-03 ENCOUNTER — Encounter: Payer: Self-pay | Admitting: Family Medicine

## 2014-08-03 VITALS — BP 127/72 | HR 64 | Temp 98.2°F | Ht 68.0 in | Wt 191.0 lb

## 2014-08-03 DIAGNOSIS — E119 Type 2 diabetes mellitus without complications: Secondary | ICD-10-CM | POA: Diagnosis not present

## 2014-08-03 DIAGNOSIS — I10 Essential (primary) hypertension: Secondary | ICD-10-CM

## 2014-08-03 DIAGNOSIS — E785 Hyperlipidemia, unspecified: Secondary | ICD-10-CM | POA: Diagnosis not present

## 2014-08-03 DIAGNOSIS — Z13820 Encounter for screening for osteoporosis: Secondary | ICD-10-CM | POA: Diagnosis not present

## 2014-08-03 DIAGNOSIS — M810 Age-related osteoporosis without current pathological fracture: Secondary | ICD-10-CM

## 2014-08-03 LAB — COMPREHENSIVE METABOLIC PANEL
ALT: 9 U/L (ref 0–35)
AST: 14 U/L (ref 0–37)
Albumin: 4.1 g/dL (ref 3.5–5.2)
Alkaline Phosphatase: 73 U/L (ref 39–117)
BUN: 11 mg/dL (ref 6–23)
CO2: 26 mEq/L (ref 19–32)
Calcium: 9.5 mg/dL (ref 8.4–10.5)
Chloride: 102 mEq/L (ref 96–112)
Creat: 0.75 mg/dL (ref 0.50–1.10)
Glucose, Bld: 120 mg/dL — ABNORMAL HIGH (ref 70–99)
Potassium: 3.7 mEq/L (ref 3.5–5.3)
Sodium: 139 mEq/L (ref 135–145)
Total Bilirubin: 0.7 mg/dL (ref 0.2–1.2)
Total Protein: 7 g/dL (ref 6.0–8.3)

## 2014-08-03 LAB — LIPID PANEL
Cholesterol: 147 mg/dL (ref 0–200)
HDL: 45 mg/dL — ABNORMAL LOW (ref >=46)
LDL Cholesterol: 82 mg/dL (ref 0–99)
Total CHOL/HDL Ratio: 3.3 Ratio
Triglycerides: 100 mg/dL (ref <150)
VLDL: 20 mg/dL (ref 0–40)

## 2014-08-03 LAB — POCT GLYCOSYLATED HEMOGLOBIN (HGB A1C): Hemoglobin A1C: 6.4

## 2014-08-03 MED ORDER — HYDROCORTISONE 0.5 % EX CREA: 1.0000 "application " | TOPICAL_CREAM | Freq: Two times a day (BID) | CUTANEOUS | Status: DC | PRN

## 2014-08-03 NOTE — Assessment & Plan Note (Signed)
Well-controlled.  Continue current regimen. 

## 2014-08-03 NOTE — Assessment & Plan Note (Addendum)
A1c 6.4. Excellent control. Continue metformin  Cardiac: on statin, aspirin. Check CMET & lipids today Renal: on ACE, check renal fxn today Eye: asked pt to schedule appt with eye doc if >1 yr since last exam Foot: due Nov 2016 Immunizations: handout & written rx given for tetanus, shingles vaccines today

## 2014-08-03 NOTE — Assessment & Plan Note (Signed)
Check lipids & CMET today, titrate as needed

## 2014-08-03 NOTE — Progress Notes (Signed)
Patient ID: Paula Reed, female   DOB: 1945-11-14, 69 y.o.   MRN: [id number]  HPI:  Hypertension: Currently taking amlodipine 10 mg daily, hydrochlorothiazide 25 mg daily, losartan 100 mg daily, and metoprolol 25 mg twice a day. Tolerating these medicines well. Denies chest pain or shortness of breath. Denies having swelling in her lower extremities.  Diabetes: Currently taking metformin 1000 mg twice a day. Is not sure when her last eye exam was. A1c today is 6.4.  Hyperlipidemia: Taking atorvastatin 40 mg daily. Tolerating this medicine well. Denies chest pain.  Rash: Improved after course of doxycycline for infected rash on her arms. Has not had any new lesions. No sores in her mouth her genitals. No fevers. Her foster child who had a similar rash, similarly improved. She does still scratch at it and it itches from time to time. Would like medicine to help with this.  ROS: See HPI.  PMFSH: History of hypertension, hyperlipidemia, and type 2 diabetes well controlled  PHYSICAL EXAM: BP 127/72 mmHg  Pulse 64  Temp(Src) 98.2 F (36.8 C) (Oral)  Ht 5\' 8"  (1.727 m)  Wt 191 lb (86.637 kg)  BMI 29.05 kg/m2 Gen: No acute distress, pleasant, cooperative HEENT: Normocephalic, atraumatic Heart: Regular rate and rhythm, no murmur Lungs: Clear to auscultation bilaterally, normal respiratory effort Neuro: Grossly nonfocal, speech normal Ext: No appreciable lower eczema edema bilaterally Skin: Healing papular rash on extensor surfaces of bilateral arms. No areas of erythema or warmth. Some excoriations from scratching. No signs of infection.  ASSESSMENT/PLAN:  Health maintenance:  -Given handouts on tetanus and shingles vaccines today -Given handout on scheduling mammogram in 1 month -Order DEXA scan for screening for osteoporosis  Diabetes mellitus, type II A1c 6.4. Excellent control. Continue metformin  Cardiac: on statin, aspirin. Check CMET & lipids today Renal: on ACE, check  renal fxn today Eye: asked pt to schedule appt with eye doc if >1 yr since last exam Foot: due Nov 2016 Immunizations: handout & written rx given for tetanus, shingles vaccines today    Hypertension Well controlled. Continue current regimen.    Hyperlipidemia Check lipids & CMET today, titrate as needed   Rash Appears to be improved. No signs of infection after doxycycline course. No new lesions. Given prescription for hydrocortisone 0.5% cream twice daily as needed for itching. Follow-up if worsening. Suspect this was due to insect bites.   FOLLOW UP: F/u in 3 months for chronic medical problems.  GrenadaBrittany J. Pollie MeyerMcIntyre, MD [facility]

## 2014-08-03 NOTE — Patient Instructions (Signed)
Checking bone density test to screen for osteoporosis See the handout on how to schedule your mammogram. This is an important test to screen for breast cancer.  Call your eye doctor to find out when your last exam was. Need this once per year. Sent in hydrocortisone cream for itching Come back if fevers, rash getting worse, or any new bumps. Checking blood work. I will call you if your test results are not normal.  Otherwise, I will send you a letter.  If you do not hear from me with in 2 weeks please call our office.      Be well, Dr. Pollie MeyerMcIntyre

## 2014-08-06 NOTE — Addendum Note (Signed)
Addended by: Garen GramsBENTON, Vic Esco F on: 08/06/2014 10:13 AM   Modules accepted: Orders

## 2014-08-07 ENCOUNTER — Other Ambulatory Visit: Payer: Medicare Other

## 2014-08-08 ENCOUNTER — Other Ambulatory Visit: Payer: Self-pay | Admitting: Family Medicine

## 2014-08-08 NOTE — Telephone Encounter (Signed)
[facility].  Informed her that the patient does not have this medication listed on her medication list. She stated that the patient contacted them to fax a form to PCP to request new Rx.  Georgeanna HarrisonSasha stated that they started faxing the form on March 2nd with no response.  Verified fax number to clinic today.  Informed her that pt's PCP is out of the office until Friday.  Sasha was going to refax the order fax.  Will forward to PCP.  Clovis PuMartin, Arshan Jabs L, RN

## 2014-08-08 NOTE — Telephone Encounter (Signed)
Checking status of refills for lidocaine and neuropathy cream

## 2014-08-10 ENCOUNTER — Encounter: Payer: Self-pay | Admitting: Family Medicine

## 2014-08-10 NOTE — Telephone Encounter (Signed)
Pt needs an appointment to discuss if she wants me to prescribe a compounded topical cream. These are usually fraudulent requests. Red team, please inform this pharmacy.  Paula DodrillBrittany J McIntyre, MD

## 2014-08-14 NOTE — Telephone Encounter (Signed)
Tried calling patient, unable to leave message as mailbox was full.

## 2014-08-16 ENCOUNTER — Ambulatory Visit: Payer: Medicare Other | Admitting: Family Medicine

## 2014-09-04 ENCOUNTER — Other Ambulatory Visit: Payer: Self-pay | Admitting: Family Medicine

## 2014-09-10 ENCOUNTER — Other Ambulatory Visit: Payer: Self-pay | Admitting: Family Medicine

## 2014-09-20 ENCOUNTER — Encounter: Payer: Self-pay | Admitting: Family Medicine

## 2014-09-20 ENCOUNTER — Ambulatory Visit (INDEPENDENT_AMBULATORY_CARE_PROVIDER_SITE_OTHER): Payer: Medicare Other | Admitting: Family Medicine

## 2014-09-20 VITALS — BP 150/63 | HR 66 | Temp 98.3°F | Ht 68.0 in | Wt 205.0 lb

## 2014-09-20 DIAGNOSIS — R21 Rash and other nonspecific skin eruption: Secondary | ICD-10-CM | POA: Insufficient documentation

## 2014-09-20 MED ORDER — TRIAMCINOLONE ACETONIDE 0.1 % EX OINT: 1.0000 "application " | TOPICAL_OINTMENT | Freq: Two times a day (BID) | CUTANEOUS | Status: DC

## 2014-09-20 NOTE — Progress Notes (Signed)
   Subjective:    Patient ID: Paula Reed, female    DOB: 13-Jul-1945, 69 y.o.   MRN: [id number]  HPI 69 year old female with hypertension, hyperlipidemia, and DM 2 presents for a same day appointment with complaints of rash.  1) Rash  Patient reports that she has had a rash for 3 days.  Rash is located on the arms as well as legs.  The face and trunk are unaffected.  She reports that the rash is very pruritic.  No new exposures that she knows of. No new medication changes.  She does report that she spends quite a bit of time outside in the evening.  No pets or pet exposure.  No recent travel.  No reported fevers or chills. No other associated symptoms.  She's been applying Vaseline with little relief. No known exacerbating factors.   Review of Systems  Constitutional: Negative for fever and chills.  Skin: Positive for rash.      Objective:   Physical Exam Filed Vitals:   09/20/14 0823  BP: 150/63  Pulse: 66  Temp: 98.3 F (36.8 C)   Vital signs reviewed.  Exam: General: Well-appearing elderly female in no acute distress. Pleasant and cooperative. Skin: Scattered erythematous papules in various stages of healing located on the upper extremities. A few similar lesions noted on the lower extremities. No current drainage.  No interdigital lesions.     Assessment & Plan:  See Problem List

## 2014-09-20 NOTE — Assessment & Plan Note (Signed)
Patient with recent rash on exposed areas. Rash appears to be consistent with insect bites.  I advised wearing long sleeves or using bug spray/off to avoid future bites. Treating rash with topical triamcinolone.  Cautioned patient on  chronic use as this may lighten the skin and/or thin the skin.

## 2014-09-20 NOTE — Patient Instructions (Signed)
It was nice to see you today.  Please use the topical steroid-induced prescribed. Do not use for greater than 2 weeks consecutively as this may thin and lighten your skin.  Please follow up closely with her primary.  Take care  Dr. Adriana Simasook

## 2014-10-18 ENCOUNTER — Telehealth: Payer: Self-pay | Admitting: Family Medicine

## 2014-10-18 NOTE — Telephone Encounter (Signed)
Theodoro Doing from [facility] is calling because this patient ordered diabetic supplies from this company. She states that they need verbal authorization in order to process this request. They also need the patient's testing frequency, knowledge of whether or not she is insulin dependent, as well as any ICD-10 codes in regards to her diabetes management. Thank you, Dorothey Baseman, ASA

## 2014-10-23 NOTE — Telephone Encounter (Signed)
Completed.  Semira Stoltzfus L, RN  

## 2014-10-23 NOTE — Telephone Encounter (Signed)
Will forward to MD, spoke with patient and confirmed that she did in fact order supplies from this company.

## 2014-10-23 NOTE — Telephone Encounter (Signed)
Tamika, can you take care of this with our new standardized order form? Thanks, Latrelle DodrillBrittany J Avis Tirone, MD

## 2014-12-01 ENCOUNTER — Other Ambulatory Visit: Payer: Self-pay | Admitting: Family Medicine

## 2014-12-03 ENCOUNTER — Ambulatory Visit (INDEPENDENT_AMBULATORY_CARE_PROVIDER_SITE_OTHER): Payer: Medicare Other | Admitting: Family Medicine

## 2014-12-03 ENCOUNTER — Encounter: Payer: Self-pay | Admitting: Family Medicine

## 2014-12-03 VITALS — BP 146/68 | HR 81 | Temp 98.1°F | Ht 68.0 in | Wt 208.8 lb

## 2014-12-03 DIAGNOSIS — R21 Rash and other nonspecific skin eruption: Secondary | ICD-10-CM | POA: Diagnosis not present

## 2014-12-03 MED ORDER — TRIAMCINOLONE ACETONIDE 0.1 % EX OINT: 1.0000 "application " | TOPICAL_OINTMENT | Freq: Two times a day (BID) | CUTANEOUS | Status: DC

## 2014-12-03 NOTE — Patient Instructions (Signed)
Sent in itch cream. Use twice a day as needed See me in 2 weeks to see how the rash is doing, sooner if worsening Wear bug spray outside  Be well, Dr. Pollie Meyer

## 2014-12-03 NOTE — Progress Notes (Signed)
Patient ID: Paula Reed, female   DOB: 07-Nov-1945, 69 y.o.   MRN: [id number]  HPI:  Pt presents for a same day appointment to discuss rash on arms.   Has had rash since one week ago. Only on her arms, not on legs. Goes outside frequently. Does not wear long pants outside. Rash is very itchy and she's scratched it a lot. Has tried applying vaseline without relief. No fevers, sores in mouth or genitals. Husband sleeps in the bed with her and does not have the rash. Feels well systemically.   ROS: See HPI  PMFSH: hx DM, HTN  PHYSICAL EXAM: BP 146/68 mmHg  Pulse 81  Temp(Src) 98.1 F (36.7 C) (Oral)  Ht  (1.727 m)  Wt 208 lb 12.8 oz (94.711 kg)  BMI 31.76 kg/m2 Gen: NAD, pleasant, cooperative Skin: erythematous papules scattered on extensor surfaces of arms, with lichenification from chronic scratching. Old healed/scarred papules present as well. No areas of marked erythema/cellulitis. No bleeding or skin breakdown.   ASSESSMENT/PLAN:  1. Rash - appears insect bites. Suspect patient may be chronically and intermittently being bitten by insects outside. Her arms are showing evidence of chronic bites and scratching. Will rx triamcinolone for itch relief presently. Counseled patient on importance of using insect repellant when outside. She will f/u with me in 2 weeks to see how the rash is doing, sooner if not improving. If persistent at that time, recommend punch biopsy to formally diagnosed etiology.  FOLLOW UP: F/u in 2 weeks for recheck of rash (will also address chronic medical issues at that visit)  [place] J. Pollie Meyer, MD [facility]

## 2014-12-18 ENCOUNTER — Ambulatory Visit (INDEPENDENT_AMBULATORY_CARE_PROVIDER_SITE_OTHER): Payer: Medicare Other | Admitting: Family Medicine

## 2014-12-18 VITALS — BP 147/71 | HR 98 | Temp 98.2°F | Ht 68.0 in | Wt 207.0 lb

## 2014-12-18 DIAGNOSIS — E119 Type 2 diabetes mellitus without complications: Secondary | ICD-10-CM | POA: Diagnosis not present

## 2014-12-18 DIAGNOSIS — I10 Essential (primary) hypertension: Secondary | ICD-10-CM

## 2014-12-18 DIAGNOSIS — R21 Rash and other nonspecific skin eruption: Secondary | ICD-10-CM

## 2014-12-18 LAB — POCT GLYCOSYLATED HEMOGLOBIN (HGB A1C): Hemoglobin A1C: 7.2

## 2014-12-18 MED ORDER — DOXYCYCLINE HYCLATE 100 MG PO CAPS: 100.0000 mg | ORAL_CAPSULE | Freq: Two times a day (BID) | ORAL | Status: DC

## 2014-12-18 NOTE — Assessment & Plan Note (Signed)
A1c 7.2, good control. Continue current medications.  Cardiac: on aspirin, statin. Lipids & CMET up to date. Renal: on ACE. Eye: pt to call us with name of eye doctor for referral Foot: due Nov 2016

## 2014-12-18 NOTE — Assessment & Plan Note (Signed)
BP 147/71 today, acceptable given age. Suspect may be mildly elevated above goal due to discomfort from rash. Will recheck at f/u visit in 1 week.

## 2014-12-18 NOTE — Assessment & Plan Note (Addendum)
Rash etiology unclear. Previously had favored insect bites, but now rash seems more pustular in nature. Patient denies scratching, but excoriations present on arms suggest she has in fact been scratching.  Patient denies any hx of allergies to any medications or foods, but her old [facility] records did note a possible rash reaction to chlorthalidone, so HCTZ could be contributing. Punch biopsy taken of rash lesion today, will send to pathologist for review in order to further evaluate etiology for rash.  Also performed I&D of L forearm abscess. Several areas on arms appear to be slightly superinfected. Will cover with doxycycline  BID x 7 days. F/u with me in clinic in 1 week. Counseled on reasons to seek care sooner.

## 2014-12-18 NOTE — Progress Notes (Signed)
Patient ID: MARCHELE DECOCK, female   DOB: 01-02-1946, 69 y.o.   MRN: [id number]  HPI:  F/u rash - used triamcinolone cream but it did not get better. No longer itchy. No sores in mouth or genitals. No fevers. No one else has the rash. Some of the spots are tender. Denies any hx of allergy to food or meds in the past. No new foods or meds. Agreeable to biopsy of rash today. Does not think these are insect bites.  DM - currently taking metformin 1000mg  BID. Unsure when last eye exam was, would like referral but needs to first find out the name of her eye doctor.  HTN - taking HCTZ 25mg  daily, losartan 100mg  daily, amlodipine 10mg  daily, metoprolol 25mg  BID. Tolerating these meds now. Denies CP, SOB, swelling.  ROS: See HPI.  PMFSH: T2DM, HLD, HTN  PHYSICAL EXAM: BP 147/71 mmHg  Pulse 98  Temp(Src) 98.2 F (36.8 C) (Oral)  Ht 5\' 8"  (1.727 m)  Wt 207 lb (93.895 kg)  BMI 31.48 kg/m2 Gen: NAD, pleasant, cooperative HEENT: NCAT Heart: RRR no murmur Lungs: CTAB NWOB Neuro: grossly nonfocal, speech normal Skin: rash on arms which consists of erythematous macules and pustules, widespread on extensor aspects of arms. Some areas with tenderness and slight induration, redness, and warmth. One 2cm area in particular on L proximal forearm with erythema, tenderness, and induration with obvious pus beneath surface. Many areas of arms have excoriations from scratching.  PROCEDURE NOTE:  Punch biopsy L upper arm: After informed written consent was obtained, using Betadine for cleansing and 1% Lidocaine with epinephrine for anesthetic, with sterile technique a 4 mm punch biopsy was used to obtain a biopsy specimen of the lesion. Hemostasis was obtained by pressure and wound was not sutured. Bandaid applied and wound care instructions provided. Be alert for any signs of cutaneous infection. The specimen was labeled and sent to pathology for evaluation. The procedure was well tolerated without  complications.   After informed written consent was obtained, skin was prepped with alcohol and anesthetized with 1% lidocaine with epinephrine. The area was then cleansed with betadine x3. An 11 blade scalpel was used to make a 0.5cm incision into the abscess, with expression of small amount of purulent fluid. Pressure was applied to aid in expulsion of further purulent fluid. Abscess was not packed or probed. Patient given care instructions, including to apply warm compresses.  ASSESSMENT/PLAN:  Rash and nonspecific skin eruption Rash etiology unclear. Previously had favored insect bites, but now rash seems more pustular in nature. Patient denies scratching, but excoriations present on arms suggest she has in fact been scratching.  Patient denies any hx of allergies to any medications or foods, but her old [facility] records did note a possible rash reaction to chlorthalidone, so HCTZ could be contributing. Punch biopsy taken of rash lesion today, will send to pathologist for review in order to further evaluate etiology for rash.  Also performed I&D of L forearm abscess. Several areas on arms appear to be slightly superinfected. Will cover with doxycycline 100mg  BID x 7 days. F/u with me in clinic in 1 week. Counseled on reasons to seek care sooner.  Diabetes mellitus, type II A1c 7.2, good control. Continue current medications.  Cardiac: on aspirin, statin. Lipids & CMET up to date. Renal: on ACE. Eye: pt to call us with name of eye doctor for referral Foot: due Nov 2016  Hypertension BP 147/71 today, acceptable given age. Suspect may be mildly elevated above  goal due to discomfort from rash. Will recheck at f/u visit in 1 week.   FOLLOW UP: F/u in 1 week for above issues  [place] J. Pollie Meyer, MD [facility]

## 2014-12-18 NOTE — Patient Instructions (Addendum)
Take doxycycline  twice a day for a week Follow up with me in 1 week Apply warm compresses to area on your lower arm Sending biopsy of skin of upper arm Keep area clean and dry  Call or return sooner if bleeding, fevers, increased drainage, spreading redness, or any other concerns.  Be well, Dr. Pollie Meyer

## 2014-12-24 ENCOUNTER — Other Ambulatory Visit: Payer: Self-pay | Admitting: Family Medicine

## 2014-12-26 ENCOUNTER — Emergency Department (HOSPITAL_COMMUNITY)
Admission: EM | Admit: 2014-12-26 | Discharge: 2014-12-27 | Disposition: A | Discharge status: Home / Self Care | Payer: Medicare Other | Attending: Emergency Medicine | Admitting: Emergency Medicine

## 2014-12-26 ENCOUNTER — Encounter (HOSPITAL_COMMUNITY): Payer: Self-pay

## 2014-12-26 ENCOUNTER — Emergency Department (HOSPITAL_COMMUNITY): Payer: Medicare Other

## 2014-12-26 DIAGNOSIS — Y92410 Unspecified street and highway as the place of occurrence of the external cause: Secondary | ICD-10-CM | POA: Diagnosis not present

## 2014-12-26 DIAGNOSIS — E119 Type 2 diabetes mellitus without complications: Secondary | ICD-10-CM | POA: Insufficient documentation

## 2014-12-26 DIAGNOSIS — I10 Essential (primary) hypertension: Secondary | ICD-10-CM | POA: Diagnosis not present

## 2014-12-26 DIAGNOSIS — E785 Hyperlipidemia, unspecified: Secondary | ICD-10-CM | POA: Diagnosis not present

## 2014-12-26 DIAGNOSIS — Y9389 Activity, other specified: Secondary | ICD-10-CM | POA: Insufficient documentation

## 2014-12-26 DIAGNOSIS — Y998 Other external cause status: Secondary | ICD-10-CM | POA: Diagnosis not present

## 2014-12-26 DIAGNOSIS — Z7982 Long term (current) use of aspirin: Secondary | ICD-10-CM | POA: Insufficient documentation

## 2014-12-26 DIAGNOSIS — M545 Low back pain, unspecified: Secondary | ICD-10-CM

## 2014-12-26 DIAGNOSIS — Z79899 Other long term (current) drug therapy: Secondary | ICD-10-CM | POA: Diagnosis not present

## 2014-12-26 DIAGNOSIS — Z8719 Personal history of other diseases of the digestive system: Secondary | ICD-10-CM | POA: Diagnosis not present

## 2014-12-26 DIAGNOSIS — S3992XA Unspecified injury of lower back, initial encounter: Secondary | ICD-10-CM | POA: Diagnosis present

## 2014-12-26 LAB — CBC
HCT: 40 % (ref 36.0–46.0)
Hemoglobin: 13.4 g/dL (ref 12.0–15.0)
MCH: 29.7 pg (ref 26.0–34.0)
MCHC: 33.5 g/dL (ref 30.0–36.0)
MCV: 88.7 fL (ref 78.0–100.0)
Platelets: 288 10*3/uL (ref 150–400)
RBC: 4.51 MIL/uL (ref 3.87–5.11)
RDW: 12.9 % (ref 11.5–15.5)
WBC: 8.4 10*3/uL (ref 4.0–10.5)

## 2014-12-26 LAB — COMPREHENSIVE METABOLIC PANEL
ALT: 14 U/L (ref 14–54)
AST: 23 U/L (ref 15–41)
Albumin: 4.1 g/dL (ref 3.5–5.0)
Alkaline Phosphatase: 92 U/L (ref 38–126)
Anion gap: 13 (ref 5–15)
BUN: 11 mg/dL (ref 6–20)
CO2: 22 mmol/L (ref 22–32)
Calcium: 9.6 mg/dL (ref 8.9–10.3)
Chloride: 104 mmol/L (ref 101–111)
Creatinine, Ser: 0.77 mg/dL (ref 0.44–1.00)
GFR calc Af Amer: >60 mL/min (ref >60)
GFR calc non Af Amer: >60 mL/min (ref >60)
Glucose, Bld: 134 mg/dL — ABNORMAL HIGH (ref 65–99)
Potassium: 4 mmol/L (ref 3.5–5.1)
Sodium: 139 mmol/L (ref 135–145)
Total Bilirubin: 0.6 mg/dL (ref 0.3–1.2)
Total Protein: 7.6 g/dL (ref 6.5–8.1)

## 2014-12-26 LAB — I-STAT CG4 LACTIC ACID, ED
Lactic Acid, Venous: 1.91 mmol/L (ref 0.5–2.0)
Lactic Acid, Venous: 2.72 mmol/L (ref 0.5–2.0)

## 2014-12-26 LAB — PROTIME-INR
INR: 1.11 (ref 0.00–1.49)
Prothrombin Time: 14.5 seconds (ref 11.6–15.2)

## 2014-12-26 LAB — ETHANOL: Alcohol, Ethyl (B): <5 mg/dL (ref <5)

## 2014-12-26 LAB — SAMPLE TO BLOOD BANK

## 2014-12-26 MED ORDER — IOHEXOL 300 MG/ML  SOLN: 100.0000 mL | Freq: Once | INTRAMUSCULAR | Status: DC | PRN

## 2014-12-26 MED ORDER — SODIUM CHLORIDE 0.9 % IV BOLUS (SEPSIS)
1000.0000 mL | Freq: Once | INTRAVENOUS | Status: AC
  Administered 2014-12-26: 1000 mL via INTRAVENOUS

## 2014-12-26 MED ORDER — HYDROCODONE-ACETAMINOPHEN 5-325 MG PO TABS
2.0000 | ORAL_TABLET | ORAL | Status: DC | PRN
Start: 2014-12-26 — End: 2015-10-10

## 2014-12-26 MED ORDER — FENTANYL CITRATE (PF) 100 MCG/2ML IJ SOLN
50.0000 ug | Freq: Once | INTRAMUSCULAR | Status: AC
  Administered 2014-12-26: 50 ug via INTRAVENOUS
  Filled 2014-12-26: qty 2
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
## 2014-12-26 NOTE — ED Notes (Signed)
Patient transported to CT SCAN . 
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
## 2014-12-26 NOTE — Discharge Instructions (Signed)

## 2014-12-27 ENCOUNTER — Ambulatory Visit: Payer: Medicare Other | Admitting: Family Medicine

## 2015-01-01 ENCOUNTER — Ambulatory Visit: Payer: Medicare Other | Admitting: Family Medicine

## 2015-01-04 ENCOUNTER — Encounter: Payer: Self-pay | Admitting: Family Medicine

## 2015-01-04 ENCOUNTER — Ambulatory Visit (INDEPENDENT_AMBULATORY_CARE_PROVIDER_SITE_OTHER): Payer: Medicare Other | Admitting: Family Medicine

## 2015-01-04 VITALS — BP 132/46 | HR 58 | Temp 97.9°F | Wt 204.0 lb

## 2015-01-04 DIAGNOSIS — R21 Rash and other nonspecific skin eruption: Secondary | ICD-10-CM | POA: Diagnosis not present

## 2015-01-04 MED ORDER — PREDNISONE 50 MG PO TABS: 50.0000 mg | ORAL_TABLET | Freq: Every day | ORAL | Status: DC

## 2015-01-04 NOTE — Assessment & Plan Note (Signed)
Per punch biopsy report: differential diagnosis of impetigo and impetiginized subcorneal pustular dermatosis, including a pustular drug reaction, IgA pemphigus, subcorneal pustular dermatosis, pustular psoriasis, and a pustular insect bite reaction. Etiology not clear to me based on this report - could be many things. No oral or mucosal involvement. Will have her stop HCTZ for now. Return next week for nurse BP check. Trial of systemic steroids -  daily for 5 days Refer to dermatology for further eval. Return here sooner if worsening. Pt agreeable to this plan.

## 2015-01-04 NOTE — Patient Instructions (Signed)
Stop hydrochlorothiazide Come back next week for BP check by nurse I am referring you to dermatology for your rash. You will get a phone call to schedule this appointment.  Take prednisone  daily for 5 days Return sooner if rash worsening or any new issues  Be well, Dr. Pollie Meyer

## 2015-01-04 NOTE — Progress Notes (Signed)
Patient ID: Paula Reed, female   DOB: [birth year], 69 y.o.   MRN: [id number]  HPI:  F/u rash: rash is still present on arms, no where else. No fevers. No sores in mouth. Willing to see dermatologist. While pt has denied any hx of allergies to me previously, upon questioning her today she does report she had a rash similar to this one with chlorthalidone in the past. This was noted in her [facility] records when she first established care here. Rash is not itchy.  ROS: See HPI.  PMFSH: hx T2DM, HTN, HLD  PHYSICAL EXAM: BP 132/46 mmHg  Pulse 58  Temp(Src) 97.9 F (36.6 C) (Oral)  Wt 204 lb (92.534 kg) Gen: NAD, pleasant, cooperative HEENT: NCAT Skin: scattered excoriated lesions on arms with occasional macules and pustules. See picture below. No findings to suggest abscess or superinfection presently.      ASSESSMENT/PLAN:  Rash and nonspecific skin eruption Per punch biopsy report: differential diagnosis of impetigo and impetiginized subcorneal pustular dermatosis, including a pustular drug reaction, IgA pemphigus, subcorneal pustular dermatosis, pustular psoriasis, and a pustular insect bite reaction. Etiology not clear to me based on this report - could be many things. No oral or mucosal involvement. Will have her stop HCTZ for now. Return next week for nurse BP check. Trial of systemic steroids -  daily for 5 days Refer to dermatology for further eval. Return here sooner if worsening. Pt agreeable to this plan.   FOLLOW UP: F/u in 1 week for RN BP check Referring to dermatology  [place] J. Pollie Meyer, MD [facility]

## 2015-01-11 ENCOUNTER — Other Ambulatory Visit: Payer: Self-pay | Admitting: Family Medicine

## 2015-03-05 ENCOUNTER — Ambulatory Visit (INDEPENDENT_AMBULATORY_CARE_PROVIDER_SITE_OTHER): Payer: Medicare Other | Admitting: Family Medicine

## 2015-03-05 ENCOUNTER — Encounter: Payer: Self-pay | Admitting: Family Medicine

## 2015-03-05 VITALS — BP 136/70 | HR 64 | Temp 98.3°F | Ht 68.0 in | Wt 202.0 lb

## 2015-03-05 DIAGNOSIS — R21 Rash and other nonspecific skin eruption: Secondary | ICD-10-CM

## 2015-03-05 DIAGNOSIS — Z23 Encounter for immunization: Secondary | ICD-10-CM

## 2015-03-05 DIAGNOSIS — I10 Essential (primary) hypertension: Secondary | ICD-10-CM | POA: Diagnosis not present

## 2015-03-05 NOTE — Patient Instructions (Signed)
I rescheduled your dermatology appointment for you  [facility] Dr. Doreen BeamWhitworth [address], [place], [place] [address] [phone] November 30 arrive at 9:50 for a 10:10 appointment.  Please keep this appointment and reschedule it if you are not able to go.  Follow up with me 1-2 weeks after that.  Be well, Dr. Pollie MeyerMcIntyre

## 2015-03-08 ENCOUNTER — Telehealth: Payer: Self-pay | Admitting: Family Medicine

## 2015-03-08 NOTE — Assessment & Plan Note (Signed)
Persistent but unchanged. Needs evaluation by specialist. Patient could not tell me if she ever went to dermatology appointment, and I never received any records from derm. I thus called the office and found that patient canceled that appointment. Was able to reschedule for Nov 30. Appointment info given to patient.

## 2015-03-08 NOTE — Assessment & Plan Note (Signed)
Well controlled off HCTZ. Continue current regimen.

## 2015-03-08 NOTE — Progress Notes (Signed)
Patient ID: Phebe CollaBetty L Cardella, female   DOB: [birth year], 69 y.o.   MRN: [id number] Date of Visit: 03/05/2015   HPI:  Patient presents to follow up on rash.  Had previously been referred to dermatology for the rash. Patient is unsure if she ever went to the appointment. Still bothering her a lot. Did stop the HCTZ without notable improvement in the rash. No fevers.  ROS: See HPI.  PMFSH: history of hypertension, diabetes, rash  PHYSICAL EXAM: BP 136/70 mmHg  Pulse 64  Temp(Src) 98.3 F (36.8 C) (Oral)  Ht 5\' 8"  (1.727 m)  Wt 202 lb (91.627 kg)  BMI 30.72 kg/m2  SpO2 96% Gen: NAD, pleasant, cooperative HEENT: NCAT Skin: continued erythematous/hyperpigmented papular rash on arms, consistent with prior exams  ASSESSMENT/PLAN:  Rash and nonspecific skin eruption Persistent but unchanged. Needs evaluation by specialist. Patient could not tell me if she ever went to dermatology appointment, and I never received any records from derm. I thus called the office and found that patient canceled that appointment. Was able to reschedule for Nov 30. Appointment info given to patient.   Hypertension Well controlled off HCTZ. Continue current regimen.   FOLLOW UP: Keep appointment with dermatology Follow up with me ~2 weeks after that  [place] J. Pollie MeyerMcIntyre, MD [facility]

## 2015-03-08 NOTE — Telephone Encounter (Signed)
error 

## 2015-03-14 ENCOUNTER — Other Ambulatory Visit: Payer: Self-pay | Admitting: Family Medicine

## 2015-03-18 ENCOUNTER — Other Ambulatory Visit: Payer: Self-pay | Admitting: Family Medicine

## 2015-04-29 ENCOUNTER — Other Ambulatory Visit: Payer: Self-pay | Admitting: Family Medicine

## 2015-05-21 ENCOUNTER — Ambulatory Visit (INDEPENDENT_AMBULATORY_CARE_PROVIDER_SITE_OTHER): Payer: Medicare Other | Admitting: Family Medicine

## 2015-05-21 ENCOUNTER — Encounter: Payer: Self-pay | Admitting: Family Medicine

## 2015-05-21 VITALS — BP 159/69 | HR 67 | Temp 97.8°F | Ht 68.0 in | Wt 200.3 lb

## 2015-05-21 DIAGNOSIS — R21 Rash and other nonspecific skin eruption: Secondary | ICD-10-CM

## 2015-05-21 NOTE — Patient Instructions (Signed)
Please go to [facility]  This Thursday. Arrive by 10:50am so you can fill out paperwork. You will see Dr. Doreen Beam  [address] [place], [place] [address] [phone]

## 2015-05-21 NOTE — Progress Notes (Signed)
Date of Visit: 05/21/2015   HPI:  Patient presents to discuss rash. Has rash on her arms. Has been present for the last month according to her, but I have actually seen her multiple times for this over the last several months. Did punch biopsy a few months ago, the pathology of which was nonspecific. I had referred her to dermatology but she canceled her appointment. This appointment was rescheduled, but it appears she never went. Patient initially tells me she did go to the dermatologist, but is unable to recall what she was told there.   I called the derm office and was told patient no showed to that appointment. She has not been seen there. Patient is willing to go again.  Denies any fever, sores in mouth or genitals. Rash does not itch or hurt presently.   ROS: See HPI.  PMFSH: type 2 diabetes, hyperlipidemia, hypertension, trigger finger  PHYSICAL EXAM: BP 159/69 mmHg  Pulse 67  Temp(Src) 97.8 F (36.6 C) (Oral)  Ht  (1.727 m)  Wt 200 lb 4.8 oz (90.855 kg)  BMI 30.46 kg/m2 Gen: NAD, pleasant, cooperative Skin: chronic hyperpigmented rash on bilateral arms, no skin breakdown, open sores, or drainage  ASSESSMENT/PLAN:  Rash and nonspecific skin eruption Stressed importance of follow up with dermatology This is out of the scope of primary care at this point I was able to reschedule her dermatology appointment for later this week Visit info given to patient in after visit summary Follow up with me in several weeks for chronic medical problems   FOLLOW UP: Follow up in several weeks for chronic medical problems Keep derm appointment   [place] J. Pollie Meyer, MD [facility]

## 2015-05-26 NOTE — Assessment & Plan Note (Signed)
Stressed importance of follow up with dermatology This is out of the scope of primary care at this point I was able to reschedule her dermatology appointment for later this week Visit info given to patient in after visit summary Follow up with me in several weeks for chronic medical problems

## 2015-08-28 LAB — HM DIABETES EYE EXAM

## 2015-09-04 ENCOUNTER — Other Ambulatory Visit: Payer: Self-pay | Admitting: *Deleted

## 2015-09-04 MED ORDER — METFORMIN HCL 1000 MG PO TABS: ORAL_TABLET | ORAL | Status: DC

## 2015-09-04 NOTE — Telephone Encounter (Signed)
90 day supply. Froilan Mclean L, RN  

## 2015-09-12 ENCOUNTER — Ambulatory Visit (INDEPENDENT_AMBULATORY_CARE_PROVIDER_SITE_OTHER): Payer: Medicare Other | Admitting: Family Medicine

## 2015-09-12 ENCOUNTER — Encounter: Payer: Self-pay | Admitting: Family Medicine

## 2015-09-12 VITALS — BP 178/75 | HR 86 | Temp 98.3°F | Wt 194.0 lb

## 2015-09-12 DIAGNOSIS — R4189 Other symptoms and signs involving cognitive functions and awareness: Secondary | ICD-10-CM | POA: Diagnosis not present

## 2015-09-12 DIAGNOSIS — R413 Other amnesia: Secondary | ICD-10-CM

## 2015-09-12 DIAGNOSIS — R21 Rash and other nonspecific skin eruption: Secondary | ICD-10-CM | POA: Diagnosis not present

## 2015-09-12 DIAGNOSIS — E119 Type 2 diabetes mellitus without complications: Secondary | ICD-10-CM

## 2015-09-12 LAB — COMPLETE METABOLIC PANEL WITH GFR
ALT: 13 U/L (ref 6–29)
AST: 20 U/L (ref 10–35)
Albumin: 4.3 g/dL (ref 3.6–5.1)
Alkaline Phosphatase: 80 U/L (ref 33–130)
BUN: 10 mg/dL (ref 7–25)
CO2: 26 mmol/L (ref 20–31)
Calcium: 9.7 mg/dL (ref 8.6–10.4)
Chloride: 101 mmol/L (ref 98–110)
Creat: 0.62 mg/dL (ref 0.50–0.99)
GFR, Est African American: >89 mL/min (ref >=60)
GFR, Est Non African American: >89 mL/min (ref >=60)
Glucose, Bld: 126 mg/dL — ABNORMAL HIGH (ref 65–99)
Potassium: 4.3 mmol/L (ref 3.5–5.3)
Sodium: 138 mmol/L (ref 135–146)
Total Bilirubin: 0.7 mg/dL (ref 0.2–1.2)
Total Protein: 7.4 g/dL (ref 6.1–8.1)

## 2015-09-12 LAB — CBC
HCT: 36.3 % (ref 35.0–45.0)
Hemoglobin: 11.7 g/dL (ref 11.7–15.5)
MCH: 27.7 pg (ref 27.0–33.0)
MCHC: 32.2 g/dL (ref 32.0–36.0)
MCV: 86 fL (ref 80.0–100.0)
MPV: 9.8 fL (ref 7.5–12.5)
Platelets: 392 10*3/uL (ref 140–400)
RBC: 4.22 MIL/uL (ref 3.80–5.10)
RDW: 14.6 % (ref 11.0–15.0)
WBC: 5.8 10*3/uL (ref 3.8–10.8)

## 2015-09-12 LAB — TSH: TSH: 1.89 mIU/L

## 2015-09-12 LAB — VITAMIN B12: Vitamin B-12: 285 pg/mL (ref 200–1100)

## 2015-09-12 MED ORDER — CLOBETASOL PROPIONATE 0.05 % EX CREA: 1.0000 "application " | TOPICAL_CREAM | Freq: Two times a day (BID) | CUTANEOUS | Status: DC

## 2015-09-12 NOTE — Progress Notes (Signed)
Date of Visit: 09/12/2015   HPI:  Patient presents to discuss rash on her arms.  Discussed with patient that I have now seen her multiple times over the last year for this same rash and have instructed her to follow up with dermatology. Despite this, she continues to present to our clinic for evaluation and treatment of the rash. I asked patient if she was ever seen by dermatology, and she could not recall whether she'd been. I do have records that she was seen there once. She is agreeable to scheduling another visit with dermatology.  Her sister accompanied her to today's visit and asked to speak with me privately. She brought up concerns about patient's memory. She has been forgetting things much more often. Patient reports that she does drive. Denies getting lost while driving.  Did not take blood pressure medications yet today.  ROS: See HPI.  PMFSH: type 2 diabetes, hypertension, hyperlipidemia  PHYSICAL EXAM: BP 178/75 mmHg  Pulse 86  Temp(Src) 98.3 F (36.8 C) (Oral)  Wt 194 lb (87.998 kg) Gen: NAD, pleasant, cooperative HEENT: normocephalic, atraumatic, moist mucous membranes  Neuro: oriented to person and city, not to the name of clinic, or any component of date. Extremities: extensive chronic appearing maculopapular rash on arms with excoriations and lichenification. No active skin breakdown, weeping, or erythema.  ASSESSMENT/PLAN:  Rash and nonspecific skin eruption Needs dermatology follow up This is out of the scope of primary care Given handout with dermatologist's phone #, address, and name on it Sister will help patient schedule In the meantime, clobetasol rx  Cognitive impairment Concerns raised by patient's sister, and my own concern about patient's inability to recall being told to follow up with dermatology instead of with me, prompted MOCA assessment today. Score was 8/30 (including one point added as patient only went to 11th grade). Suggests fairly  significant cognitive impairment. Will start workup for reversible causes of dementia including RPR, HIV, B12, TSH, CMET, CBC. Also schedule MRI brain. Follow up with me after those tests to discuss more. Counseled patient and sister that she should NOT drive anymore. Handout on safety in dementia given for sister to share with patient's husband. Encouraged them to bring him to her next appointment.    FOLLOW UP: Follow up in 1-2 weeks after MRI for dementia.  GrenadaBrittany J. Pollie MeyerMcIntyre, MD [facility]

## 2015-09-12 NOTE — Patient Instructions (Addendum)
Please call the dermatology office and schedule an appointment with them [facility]  [address] [place], [place] [address] [phone] I sent in a prescription for a medicine to use on your arms  For problems with thinking: - getting labwork today - getting MRI of your brain  Follow up with me after you have these tests done to review results and decide on further plan DO NOT DRIVE  Be well, Dr. Pollie Meyer   Dementia Dementia is a word that is used to describe problems with the brain and how it works. People with dementia have memory loss. They may also have problems with thinking, speaking, or solving problems. It can affect how they act around people, how they do their job, their mood, and their personality. These changes may not show up for a long time. Family or friends may not notice problems in the early part of this disease. HOME CARE The following tips are for the person living with, or caring for, the person with dementia. Make the home safe.  Remove locks on bathroom doors.  Use childproof locks on cabinets where alcohol, cleaning supplies, or chemicals are stored.  Put outlet covers in electrical outlets.  Put in childproof locks to keep doors and windows safe.  Remove stove knobs, or put in safety knobs that shut off on their own.  Lower the temperature on water heaters.  Label medicines. Lock them in a safe place.  Keep knives, lighters, matches, power tools, and guns out of reach or in a safe place.  Remove objects that might break or can hurt the person.  Make sure lighting is good inside and outside.  Put in grab bars if needed.  Use a device that detects falls or other needs for help. Lessen confusion.  Keep familiar objects and people around.  Use night lights or low lit (dim) lights at night.  Label objects or areas.  Use reminders, notes, or directions for daily activities or tasks.  Keep a simple routine that is the same  for waking, meals, bathing, dressing, and bedtime.  Create a calm and quiet home.  Put up clocks and calendars.  Keep emergency numbers and the home address near all phones.  Help show the different times of day. Open the curtains during the day to let light in. Speak clearly and directly.  Choose simple words and short sentences.  Use a gentle, calm voice.  Do not interrupt.  If the person has a hard time finding a word to use, give them the word or thought.  Ask 1 question at a time. Give enough time for the person to answer. Repeat the question if the person does not answer. Do things that lessen restlessness.  Provide a comfortable bed.  Have the same bedtime routine every night.  Have a regular walking and activity schedule.  Lessen naps during the day.  Do not let the person drink a lot of caffeine.  Go to events that are not overwhelming. Eat well and drink fluids.  Lessen distractions during meal times and snacks.  Avoid foods that are too hot or too cold.  Watch how the person chews and swallows. This is to make sure they do not choke. Other  Keep all vision, hearing, dental, and medical visits with the doctor.  Only give medicines as told by the doctor.  Watch the person's driving ability. Do not let the person drive if he or she cannot drive safely.  Use a program that  helps find a person if they become missing. You may need to register with this program. GET HELP RIGHT AWAY IF:   A fever of 102 F (38.9 C) develops.  Confusion develops or gets worse.  Sleepiness develops or gets worse.  Staying awake is hard to do.  New behavior problems start like mood swings, aggression, and seeing things that are not there.  Problems with balance, speech, or falling develop.  Problems swallowing develop.  Any problems of another sickness develop. MAKE SURE YOU:  Understand these instructions.  Will watch his or her condition.  Will get help right  away if he or she is not doing well or gets worse.   This information is not intended to replace advice given to you by your health care provider. Make sure you discuss any questions you have with your health care provider.   Document Released: 03/26/2008 Document Revised: 07/06/2011 Document Reviewed: 09/08/2010 Elsevier Interactive Patient Education [employer]

## 2015-09-13 LAB — RPR

## 2015-09-13 LAB — HIV ANTIBODY (ROUTINE TESTING W REFLEX): HIV 1&2 Ab, 4th Generation: NONREACTIVE

## 2015-09-15 DIAGNOSIS — G309 Alzheimer's disease, unspecified: Secondary | ICD-10-CM

## 2015-09-15 DIAGNOSIS — F0280 Dementia in other diseases classified elsewhere without behavioral disturbance: Secondary | ICD-10-CM | POA: Insufficient documentation

## 2015-09-15 HISTORY — DX: Alzheimer's disease, unspecified: G30.9

## 2015-09-15 HISTORY — DX: Dementia in other diseases classified elsewhere, unspecified severity, without behavioral disturbance, psychotic disturbance, mood disturbance, and anxiety: F02.80

## 2015-09-15 NOTE — Assessment & Plan Note (Addendum)
Concerns raised by patient's sister, and my own concern about patient's inability to recall being told to follow up with dermatology instead of with me, prompted MOCA assessment today. Score was 8/30 (including one point added as patient only went to 11th grade). Suggests fairly significant cognitive impairment. Will start workup for reversible causes of dementia including RPR, HIV, B12, TSH, CMET, CBC. Also schedule MRI brain. Follow up with me after those tests to discuss more. Counseled patient and sister that she should NOT drive anymore. Handout on safety in dementia given for sister to share with patient's husband. Encouraged them to bring him to her next appointment.

## 2015-09-15 NOTE — Assessment & Plan Note (Signed)
Needs dermatology follow up This is out of the scope of primary care Given handout with dermatologist's phone #, address, and name on it Sister will help patient schedule In the meantime, clobetasol rx

## 2015-09-18 NOTE — Progress Notes (Signed)
Addendum for billing purposes Vit B12 diagnosis code is for R41.3 (other amnesia)

## 2015-09-19 ENCOUNTER — Ambulatory Visit (HOSPITAL_COMMUNITY)
Admission: RE | Admit: 2015-09-19 | Discharge: 2015-09-19 | Disposition: A | Discharge status: Home / Self Care | Payer: Medicare Other | Source: Ambulatory Visit | Attending: Family Medicine | Admitting: Family Medicine

## 2015-09-19 DIAGNOSIS — I739 Peripheral vascular disease, unspecified: Secondary | ICD-10-CM | POA: Diagnosis not present

## 2015-09-19 DIAGNOSIS — R4189 Other symptoms and signs involving cognitive functions and awareness: Secondary | ICD-10-CM | POA: Diagnosis present

## 2015-09-19 DIAGNOSIS — F0390 Unspecified dementia without behavioral disturbance: Secondary | ICD-10-CM | POA: Insufficient documentation

## 2015-09-20 ENCOUNTER — Ambulatory Visit (INDEPENDENT_AMBULATORY_CARE_PROVIDER_SITE_OTHER): Payer: Medicare Other | Admitting: Family Medicine

## 2015-09-20 ENCOUNTER — Encounter: Payer: Self-pay | Admitting: Family Medicine

## 2015-09-20 VITALS — BP 157/59 | HR 91 | Temp 98.1°F | Ht 68.0 in | Wt 196.0 lb

## 2015-09-20 DIAGNOSIS — I10 Essential (primary) hypertension: Secondary | ICD-10-CM | POA: Diagnosis not present

## 2015-09-20 DIAGNOSIS — R4189 Other symptoms and signs involving cognitive functions and awareness: Secondary | ICD-10-CM

## 2015-09-20 DIAGNOSIS — E119 Type 2 diabetes mellitus without complications: Secondary | ICD-10-CM | POA: Diagnosis not present

## 2015-09-20 LAB — POCT GLYCOSYLATED HEMOGLOBIN (HGB A1C): Hemoglobin A1C: 6.2

## 2015-09-20 MED ORDER — DONEPEZIL HCL 5 MG PO TABS: 5.0000 mg | ORAL_TABLET | Freq: Every day | ORAL | Status: DC

## 2015-09-20 NOTE — Assessment & Plan Note (Signed)
Blood pressure above goal, but new diagnosis likely stressful to patient. Will hold on medication changes for now. [facility]

## 2015-09-20 NOTE — Progress Notes (Signed)
Date of Visit: 09/20/2015   HPI:  Patient presents to follow up on labs and MRI testing, which were ordered due to cognitive impairment. Labs and MRI were both unremarkable. She is accompanied to clinic today by her husband.  Husband reports he has noticed problems with patient's memory over the last months to year. She gets confused or forgets things. Denies that she has any wandering behaviors. No episodes of unsafe cooking. He is aware that she has been instructed not to drive anymore.  Diabetes - remains on metformin twice daily  Hypertension - taking amlodipine, losartan, metoprolol. Took blood pressure medications this morning. No chest pain or shortness of breath.   ROS: See HPI.  PMFSH: history of type 2 diabetes, hyperlipidemia, cognitive impairment, hypertension   PHYSICAL EXAM: BP 157/59 mmHg  Pulse 91  Temp(Src) 98.1 F (36.7 C) (Oral)  Ht 5\' 8"  (1.727 m)  Wt 196 lb (88.905 kg)  BMI 29.81 kg/m2 Gen: NAD, pleasant, cooperative HEENT: normocephalic, atraumatic  Heart: regular rate and rhythm, no murmur Lungs: normal work of breathing  Neuro: alert, speech normal  ASSESSMENT/PLAN:  Cognitive impairment Reviewed labs and MRI findings with patient and husband today - no medical explanation for reversible cause of dementia. Suspect this is early Alzheimer's disease. Reviewed basic principles of safety in dementia, including that patient should not drive anymore, and should be cautious about cooking/stove/oven.  - Will do trial of Aricept 5mg  daily.  - Recommend patient be seen in geriatric clinic with Dr. McDiarmid for more comprehensive evaluation  & recommendations. Appointment scheduled for June 15 at 2:30pm - patient and husband agreeable to this plan.  Hypertension Blood pressure above goal, but new diagnosis likely stressful to patient. Will hold on medication changes for now. [facility]  Diabetes mellitus, type II Well  controlled with A1c of 6.2. Continue current regimen. Will plan to discuss diabetes health maintenance items (foot, vaccines, eye etc) at future visit.   FOLLOW UP: Follow up in geriatrics clinic as scheduled See me after geri clinic visit.  GrenadaBrittany J. Pollie MeyerMcIntyre, MD [facility]

## 2015-09-20 NOTE — Assessment & Plan Note (Signed)
Reviewed labs and MRI findings with patient and husband today - no medical explanation for reversible cause of dementia. Suspect this is early Alzheimer's disease. Reviewed basic principles of safety in dementia, including that patient should not drive anymore, and should be cautious about cooking/stove/oven.  - Will do trial of Aricept 5mg  daily.  - Recommend patient be seen in geriatric clinic with Dr. McDiarmid for more comprehensive evaluation  & recommendations. Appointment scheduled for June 15 at 2:30pm - patient and husband agreeable to this plan.

## 2015-09-20 NOTE — Assessment & Plan Note (Signed)
Well controlled with A1c of 6.2. Continue current regimen. Will plan to discuss diabetes health maintenance items (foot, vaccines, eye etc) at future visit.

## 2015-09-20 NOTE — Patient Instructions (Signed)
Sent in medicine, called Aricept, to help with dementia Take one pill once per day  Follow up with Dr. Perley JainMcDiarmid in geriatrics clinic as scheduled on June 15 at 2:30pm [facility].  Call any time with questions.  Be well, Dr. Pollie MeyerMcIntyre   Dementia Dementia is a general term for problems with brain function. A person with dementia has memory loss and a hard time with at least one other brain function such as thinking, speaking, or problem solving. Dementia can affect social functioning, how you do your job, your mood, or your personality. The changes may be hidden for a long time. The earliest forms of this disease are usually not detected by family or friends. Dementia can be:  Irreversible.  Potentially reversible.  Partially reversible.  Progressive. This means it can get worse over time. CAUSES  Irreversible dementia causes may include:  Degeneration of brain cells (Alzheimer disease or Lewy body dementia).  Multiple small strokes (vascular dementia).  Infection (chronic meningitis or Creutzfeldt-Jakob disease).  Frontotemporal dementia. This affects younger people, age 69 to 2470, compared to those who have Alzheimer disease.  Dementia associated with other disorders like Parkinson disease, Huntington disease, or HIV-associated dementia. Potentially or partially reversible dementia causes may include:  Medicines.  Metabolic causes such as excessive alcohol intake, vitamin B12 deficiency, or thyroid disease.  Masses or pressure in the brain such as a tumor, blood clot, or hydrocephalus. SIGNS AND SYMPTOMS  Symptoms are often hard to detect. Family members or coworkers may not notice them early in the disease process. Different people with dementia may have different symptoms. Symptoms can include:  A hard time with memory, especially recent memory. Long-term memory may not be impaired.  Asking the same question multiple times or forgetting something  someone just said.  A hard time speaking your thoughts or finding certain words.  A hard time solving problems or performing familiar tasks (such as how to use a telephone).  Sudden changes in mood.  Changes in personality, especially increasing moodiness or mistrust.  Depression.  A hard time understanding complex ideas that were never a problem in the past. DIAGNOSIS  There are no specific tests for dementia.   Your health care provider may recommend a thorough evaluation. This is because some forms of dementia can be reversible. The evaluation will likely include a physical exam and getting a detailed history from you and a family member. The history often gives the best clues and suggestions for a diagnosis.  Memory testing may be done. A detailed brain function evaluation called neuropsychologic testing may be helpful.  Lab tests and brain imaging (such as a CT scan or MRI scan) are sometimes important.  Sometimes observation and re-evaluation over time is very helpful. TREATMENT  Treatment depends on the cause.   If the problem is a vitamin deficiency, it may be helped or cured with supplements.  For dementias such as Alzheimer disease, medicines are available to stabilize or slow the course of the disease. There are no cures for this type of dementia.  Your health care provider can help direct you to groups, organizations, and other health care providers to help with decisions in the care of you or your loved one. HOME CARE INSTRUCTIONS The care of individuals with dementia is varied and dependent upon the progression of the dementia. The following suggestions are intended for the person living with, or caring for, the person with dementia.  Create a safe environment.  Remove the  locks on bathroom doors to prevent the person from accidentally locking himself or herself in.  Use childproof latches on kitchen cabinets and any place where cleaning supplies, chemicals, or  alcohol are kept.  Use childproof covers in unused electrical outlets.  Install childproof devices to keep doors and windows secured.  Remove stove knobs or install safety knobs and an automatic shut-off on the stove.  Lower the temperature on water heaters.  Label medicines and keep them locked up.  Secure knives, lighters, matches, power tools, and guns, and keep these items out of reach.  Keep the house free from clutter. Remove rugs or anything that might contribute to a fall.  Remove objects that might break and hurt the person.  Make sure lighting is good, both inside and outside.  Install grab rails as needed.  Use a monitoring device to alert you to falls or other needs for help.  Reduce confusion.  Keep familiar objects and people around.  Use night lights or dim lights at night.  Label items or areas.  Use reminders, notes, or directions for daily activities or tasks.  Keep a simple, consistent routine for waking, meals, bathing, dressing, and bedtime.  Create a calm, quiet environment.  Place large clocks and calendars prominently.  Display emergency numbers and home address near all telephones.  Use cues to establish different times of the day. An example is to open curtains to let the natural light in during the day.   Use effective communication.  Choose simple words and short sentences.  Use a gentle, calm tone of voice.  Be careful not to interrupt.  If the person is struggling to find a word or communicate a thought, try to provide the word or thought.  Ask one question at a time. Allow the person ample time to answer questions. Repeat the question again if the person does not respond.  Reduce nighttime restlessness.  Provide a comfortable bed.  Have a consistent nighttime routine.  Ensure a regular walking or physical activity schedule. Involve the person in daily activities as much as possible.  Limit napping during the day.  Limit  caffeine.  Attend social events that stimulate rather than overwhelm the senses.  Encourage good nutrition and hydration.  Reduce distractions during meal times and snacks.  Avoid foods that are too hot or too cold.  Monitor chewing and swallowing ability.  Continue with routine vision, hearing, dental, and medical screenings.  Give medicines only as directed by the health care provider.  Monitor driving abilities. Do not allow the person to drive when safe driving is no longer possible.  Register with an identification program which could provide location assistance in the event of a missing person situation. SEEK MEDICAL CARE IF:   New behavioral problems start such as moodiness, aggressiveness, or seeing things that are not there (hallucinations).  Any new problem with brain function happens. This includes problems with balance, speech, or falling a lot.  Problems with swallowing develop.  Any symptoms of other illness happen. Small changes or worsening in any aspect of brain function can be a sign that the illness is getting worse. It can also be a sign of another medical illness such as infection. Seeing a health care provider right away is important. SEEK IMMEDIATE MEDICAL CARE IF:   A fever develops.  New or worsened confusion develops.  New or worsened sleepiness develops.  Staying awake becomes hard to do.   This information is not intended to replace advice  given to you by your health care provider. Make sure you discuss any questions you have with your health care provider.   Document Released: 10/07/2000 Document Revised: 05/04/2014 Document Reviewed: 09/08/2010 Elsevier Interactive Patient Education [employer]

## 2015-10-10 ENCOUNTER — Other Ambulatory Visit: Payer: Self-pay | Admitting: Family Medicine

## 2015-10-10 ENCOUNTER — Encounter: Payer: Self-pay | Admitting: Family Medicine

## 2015-10-10 ENCOUNTER — Ambulatory Visit (INDEPENDENT_AMBULATORY_CARE_PROVIDER_SITE_OTHER): Payer: Medicare Other | Admitting: Family Medicine

## 2015-10-10 VITALS — BP 156/62 | HR 89 | Ht 68.0 in | Wt 197.0 lb

## 2015-10-10 DIAGNOSIS — F0280 Dementia in other diseases classified elsewhere without behavioral disturbance: Secondary | ICD-10-CM

## 2015-10-10 DIAGNOSIS — G309 Alzheimer's disease, unspecified: Secondary | ICD-10-CM

## 2015-10-10 NOTE — Assessment & Plan Note (Addendum)
Suggestive of Alzheimer's dementia based on review of her imaging, lab work, and evaluation today  MOCA score of 9/30 and doesn't appear to be depressed  She has a good support system around her including her husband and family members.  They are doing well for themselves and do not report needing any other resources at this time.  - counseled on Alzheimer's dementia and provided a booklet to her husband with information about caring for someone with Alzheimer's dementia.  - suggest stopping Aricept as she is unlikely to have any benefit from the medication and more likely to have side effects.  - advised to follow up with PCP as indicated.  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  Her cognitive testing score is in the severe stage.  It may be that Ms Culpepper's husband is supplementing her ADLs more than he would admit.    Mr Dareen Pianonderson appeared comfortable with his wife's dementia diagnosis.  He feels he and his church will be able to meet Mrs Cervantes's needs.  It still may be helpful to ask Ms Christell ConstantMoore to call Mr Dareen Pianonderson to see if Case Management would be of any help to them supporting Mrs Dareen Pianonderson in the community.  I will send Ms Christell ConstantMoore a note asking if she would contact Mr Dareen Pianonderson to see if he knows what community resources are available and if a CM would be helpful to him and his wife.   Mr Dareen Pianonderson has not noted an improvement in his wife's physical or cognitive functioning since start of Aricept 5/26.  She does appear to be tolerating it with the common cholinomimetic adverse effects. If Mr Dareen Pianonderson does not appreciate an improvement after a two month trial of Donepezil, then it would be reasonable to stop the medication as it is unlikely to provide any benefit with further therapy.   Contact  information for [employer] given to Mr Dareen Pianonderson as another source of information and support for patients and  families dealing with dementia.    adverse effects.

## 2015-10-10 NOTE — Progress Notes (Signed)
[facility]:   Patient is accompanied by: spouse and husband Primary caregiver: spouse Patient's lives with their spouse. Patient information was obtained from patient and spouse/SO. History/Exam limitations: dementia. Primary Care Provider: Levert Feinstein, MD Referring provider: PCP  Reason for referral:  Chief Complaint  Patient presents with  . Dementia   History Chief Complaint  Patient presents with  . Dementia    HPI by problems:   Her husband reports he has noticed that his wife's memory is not what it used to be. She has been hiding things around the house and forgetting where she places them. He denies any wandering behavior. They tend to eat out frequently or go to his sister's house to eat.  He handles most of the finances but reports she does her own laundry. She was instructed to not to drive anymore.   Outpatient Encounter Prescriptions as of 10/10/2015  Medication Sig  . aspirin 81 MG tablet Take 81 mg by mouth daily. Reported on 10/10/2015  . atorvastatin (LIPITOR) 40 MG tablet TAKE 1 TABLET (40 MG TOTAL) BY MOUTH DAILY.  [name] donepezil (ARICEPT) 5 MG tablet Take 1 tablet (5 mg total) by mouth at bedtime.  [name] losartan (COZAAR) 100 MG tablet TAKE 1 TABLET BY MOUTH DAILY  . metFORMIN (GLUCOPHAGE) 1000 MG tablet TAKE 1 TABLET BY MOUTH TWICE A DAY WITH A MEAL  . metoprolol tartrate (LOPRESSOR) 25 MG tablet TAKE 1 TABLET (25 MG TOTAL) BY MOUTH 2 (TWO) TIMES DAILY.  . [DISCONTINUED] amLODipine (NORVASC) 10 MG tablet TAKE 1 TABLET BY MOUTH DAILY  . [DISCONTINUED] clobetasol cream (TEMOVATE) 0.05 % Apply 1 application topically 2 (two) times daily. For 2 weeks (Patient not taking: Reported on 10/10/2015)  . [DISCONTINUED] doxycycline (VIBRAMYCIN) 100 MG capsule Take 1 capsule (100 mg total) by mouth 2 (two) times daily.  . [DISCONTINUED] HYDROcodone-acetaminophen (NORCO/VICODIN) 5-325 MG per tablet Take 2 tablets by mouth every 4 (four) hours as  needed.  . [DISCONTINUED] hydrocortisone cream 0.5 % Apply 1 application topically 2 (two) times daily as needed for itching.  . [DISCONTINUED] predniSONE (DELTASONE) 50 MG tablet Take 1 tablet (50 mg total) by mouth daily with breakfast. For 5 days  . [DISCONTINUED] triamcinolone ointment (KENALOG) 0.1 % Apply 1 application topically 2 (two) times daily. For 1 week. Do not use > 2 weeks consecutively.   No facility-administered encounter medications on file as of 10/10/2015.   History Patient Active Problem List   Diagnosis Date Noted  . Alzheimer's dementia without behavioral disturbance 09/15/2015  . Rash and nonspecific skin eruption 12/18/2014  . Rash 09/20/2014  . Bruit of right carotid artery 03/26/2014  . Back pain 07/06/2013  . Trigger finger 11/03/2012  . Heart murmur 04/22/2012  . Diabetes mellitus, type II (HCC) 02/02/2012  . Hypertension 02/02/2012  . Hyperlipidemia 02/02/2012  . Routine adult health maintenance 02/02/2012   Past Medical History  Diagnosis Date  . Hypertension   . Diabetes (HCC)   . Hyperlipidemia   . Allergic rhinitis   . Hemorrhoids    Past Surgical History  Procedure Laterality Date  . Ectopic pregnancy surgery    . Carpel tunnel release     Family History  Problem Relation Age of Onset  . Heart disease Father   . Hypertension Father   . Hypertension Mother   . Colon cancer      aunt   Social History   Social History  . Marital Status: Married    Spouse  Name: Billey Gosling  . Number of Children: 5  . Years of Education: 11   Occupational History  . Retired- Airline pilot    Social History Main Topics  . Smoking status: Never Smoker   . Smokeless tobacco: None  . Alcohol Use: No  . Drug Use: No  . Sexual Activity: Not Asked   Other Topics Concern  . None   Social History Narrative   Feels safe in relationship. Negative depression screen on 02/02/12.       Health Care POA:    Emergency Contact: husband, Billey Gosling [phone]   End of Life  Plan: gave AD information 4/15   Who lives with you: husband and 3 foster girls ages 46, 77, 39(07/2013)   Any pets: none   Diet: Pt has a varied diet of protein, starch and vegetables.   Exercise: Pt does not have regular exercise routine.    Seatbelts: Pt reports wearing seatbelt when in vehicles.    Hobbies: walking, eating out             Family History of Dementia: No   Basic Activities of Daily Living  ADLs Independent Needs Assistance Dependent  Bathing x    Dressing x    Ambulation x    Toileting x    Eating x     Instrumental Activities of Daily Living IADL Independent Needs Assistance Dependent  Cooking  x   Housework x    Manage Medications   x  Manage the telephone x    Shopping for food, clothes, Meds, etc  x   Use transportation   x  Manage Finances   x    Caregivers in home: spouse  Caregiver Burdens: none   FALLS in last five office visits:  Fall Risk  10/10/2015 05/21/2015 12/18/2014 09/20/2014 08/03/2014  Falls in the past year? No No No No No    Health Maintenance reviewed: Immunization History  Administered Date(s) Administered  . Influenza Split 02/02/2012  . Influenza,inj,Quad PF,36+ Mos 01/31/2013, 03/05/2015  . Pneumococcal Polysaccharide-23 08/15/2013  . Tdap 08/08/2014  . Zoster 08/08/2014   Health Maintenance Topics with due status: Overdue     Topic Date Due   Hepatitis C Screening October 09, 1945   OPHTHALMOLOGY EXAM 03/23/1957   COLONOSCOPY 03/23/1997   DEXA SCAN 03/23/2012   FOOT EXAM 04/22/2013   PNA vac Low Risk Adult 08/16/2014   MAMMOGRAM 09/13/2015    Diet: Regular Nutritional supplements: none  Geriatric Syndromes: Constipation no ,   Incontinence no  Dizziness no   Syncope no   Skin problems no   Visual Impairment no   Hearing impairment no  Eating impairment no  Impaired Memory or Cognition yes   Behavioral problems no   Sleep problems no   Weight loss no    ROS Denies fevers/chills; denies changes in  appetite; denies changes in weight;  Denies changes in vision / hearing / smell / taste; Denies runny nose / ear pain or discharge / sore throat / sinus congestion / cough/w phlegm; Denies chest congestion / wheezing;  Denies chest pain; denies heart beating slower/thumps inside chest; denies racing heart/flutter; Denies dysuria; denies hematuria;  Denies constipation; denies melena/hematochezia; denies diarrhea;  Denies abdominal discomfort/gaseous bloating; denies N/V; denies heart burn;  Denies recent falls/unsteady gait;  Denies unilateral weakness / clumsiness / tingling / numbness; denies tremors;  Denies sadness / anxiety / suicidal tendencies  Vital Signs Weight: 197 lb (89.359 kg) Body mass index is 29.96 kg/(m^2). CrCl cannot be  calculated (Patient has no serum creatinine result on file.). Body surface area is 2.07 meters squared. Filed Vitals:   10/10/15 1444  BP: 156/62  Pulse: 89  Height:  (1.727 m)  Weight: 197 lb (89.359 kg)   Wt Readings from Last 3 Encounters:  10/10/15 197 lb (89.359 kg)  09/20/15 196 lb (88.905 kg)  09/12/15 194 lb (87.998 kg)    Hearing Screening   Method: Audiometry           Right ear:   40 0 40 40   Left ear:   40 0 40 40     Visual Acuity Screening   Right eye Left eye Both eyes  Without correction:     With correction:    Physical Examination:  BP 156/62 mmHg  Pulse 89  Ht  (1.727 m)  Wt 197 lb (89.359 kg)  BMI 29.96 kg/m2 GEN: Alert, Cooperative, Groomed, NAD HEENT: EOMI;  COR: Regular rate  LUNGS:speaking in full sentences EXT: No peripheral leg edema. [name]  SKIN: No lesion nor rashes of face/trunk/extremities Neuro: Oriented to city Gait: No significant path deviation, Step-through present  Psych: Normal affect/thought/speech/language   Montreal Cognitive Assessment:  Patient did  require additional cues or prompts to complete tasks. Patient was  cooperative and attentive to testing tasks Patient did  appear motivated to perform well  MMSE - Mini Mental State Exam 08/17/2013  Orientation to time 5  Orientation to Place 5  Registration 3  Attention/ Calculation 5  Recall 1  Language- name 2 objects 2  Language- repeat 1  Language- follow 3 step command 3  Language- read & follow direction 1  Write a sentence 1  Copy design 1  Total score 28        Montreal Cognitive Assessment  10/11/2015  Visuospatial/ Executive (0/5) 1  Naming (0/3) 2  Attention: Read list of digits (0/2) 1  Attention: Read list of letters (0/1) 1  Attention: Serial 7 subtraction starting at 100 (0/3) 0  Language: Repeat phrase (0/2) 1  Language : Fluency (0/1) 1  Abstraction (0/2) 0  Delayed Recall (0/5) 0  Orientation (0/6) 1  Total 8  Adjusted Score (based on education) 9    Geriatric Depression Scale:  2 / 15  Labs  Lab Results  Component Value Date   VITAMINB12 285 09/12/2015    No results found for: FOLATE  Lab Results  Component Value Date   TSH 1.89 09/12/2015    No results found for: RPR    Chemistry      Component Value Date/Time   NA 138 09/12/2015 1025   K 4.3 09/12/2015 1025   CL 101 09/12/2015 1025   CO2 26 09/12/2015 1025   BUN 10 09/12/2015 1025   CREATININE 0.62 09/12/2015 1025   CREATININE 0.77 12/26/2014 1932      Component Value Date/Time   CALCIUM 9.7 09/12/2015 1025   ALKPHOS 80 09/12/2015 1025   AST 20 09/12/2015 1025   ALT 13 09/12/2015 1025   BILITOT 0.7 09/12/2015 1025       Lab Results  Component Value Date   HGBA1C 6.2 09/20/2015      Lab Results  Component Value Date   WBC 5.8 09/12/2015   HGB 11.7 09/12/2015   HCT 36.3 09/12/2015   MCV 86.0 09/12/2015   PLT 392 09/12/2015    No results found for this or any previous visit (from the past 24 hour(s)).  Imaging  Brain MRI: Sep 19, 2015:    Assessment and Plan: Problem List Items Addressed This Visit      Unprioritized    Alzheimer's dementia without behavioral disturbance - Primary    Suggestive of Alzheimer's dementia based on review of her imaging, lab work, and evaluation today  MOCA score of 9/30 and doesn't appear to be depressed  She has a good support system around her including her husband and family members.  They are doing well for themselves and do not report needing any other resources at this time.  - counseled on Alzheimer's dementia and provided a booklet to her husband with information about caring for someone with Alzheimer's dementia.  - suggest stopping Aricept as she is unlikely to have any benefit from the medication and more likely to have side effects.  - advised to follow up with PCP as indicated.          Personal Strengths Physical Health Religious Affiliation Supportive family/friends  Support System Strengths Supportive Relationships, Family, Friends, Warehouse managerChurch and Spirituality  Advanced Directives: Code Status: unknwon    Patient to Follow up with Dr. Pollie MeyerMcIntyre in 2 week(s)  Myra RudeJeremy E Monette Omara, MD PGY-3, [facility] 10/11/2015, 3:19 PM

## 2015-10-11 ENCOUNTER — Encounter: Payer: Self-pay | Admitting: Family Medicine

## 2015-10-11 NOTE — Progress Notes (Signed)
I have interviewed  the patient with Dr Daphene JaegerJ. Schmitz.  I have discussed the case and verified the key findings with him.   I agree with his assessments and plans as documented in his geriatric Clinic consult  note.   I interviewed Mr Berenice PrimasCharlie Arkwright separately from his wife.   His concerns are the gradual progression over the last year of his wife's forgetfulness.  Mrs Dareen Pianonderson is hiding things and forgets where she put them.  She is losing things more frequently.  She does not remember the date or month or day of week.  She asks family members the same questions repeatedly.  He denies her wandering, getting lost, visual or auditory hallucinations, prolonged sadness, loss of interests, change in appetite, change in sleep patterns, easy agitation, resistance to assistance.   Mr Dareen Pianonderson is helping with most of his wife's iADLs

## 2015-10-16 ENCOUNTER — Telehealth: Payer: Self-pay | Admitting: Licensed Clinical Social Worker

## 2015-10-16 NOTE — Telephone Encounter (Signed)
Call to patient's husband to provide community information and resources for patient's new dementia diagnosis.  Per patient's husband, patient is taking medicine and doing fine.  They have no needs at this time.  States he understands progression of dementia and they may need information later. He was not open to receiving information.  Husband was pleasant and appreciative of the call.    Plan: Husband states he will follow up with Dr. McDiarmid when needed.   Sammuel Hineseborah Myan Locatelli. LCSW Clinical Social Work, [facility]   [phone] 4:06 PM

## 2015-10-16 NOTE — Telephone Encounter (Signed)
CSW received referral from Dr. McDiarmid to assist patient and husband with resources for new dementia diagnosis   Call made to patient.  Mailbox is full unable to leave a message.  No other phone number listed in chart.   Plan: CSW will continue to follow up to provided resources and information.   [name]. LCSW Clinical Social Work, [facility]   [phone] 10:58 AM

## 2015-10-22 ENCOUNTER — Other Ambulatory Visit: Payer: Self-pay | Admitting: *Deleted

## 2015-10-24 MED ORDER — DONEPEZIL HCL 5 MG PO TABS: 5.0000 mg | ORAL_TABLET | Freq: Every day | ORAL | Status: DC

## 2015-11-29 ENCOUNTER — Ambulatory Visit: Payer: Medicare Other | Admitting: *Deleted

## 2015-11-30 ENCOUNTER — Other Ambulatory Visit: Payer: Self-pay | Admitting: Family Medicine

## 2016-01-12 ENCOUNTER — Other Ambulatory Visit: Payer: Self-pay | Admitting: Family Medicine

## 2016-01-14 ENCOUNTER — Telehealth: Payer: Self-pay | Admitting: Family Medicine

## 2016-01-14 NOTE — Telephone Encounter (Signed)
Patient's husband asks refill for dementia slow down. Please, follow up.

## 2016-01-15 NOTE — Telephone Encounter (Signed)
Done. Gennavieve Huq J Payge Eppes, MD  

## 2016-03-01 ENCOUNTER — Emergency Department (HOSPITAL_COMMUNITY): Payer: Medicare Other

## 2016-03-01 ENCOUNTER — Emergency Department (HOSPITAL_COMMUNITY)
Admission: EM | Admit: 2016-03-01 | Discharge: 2016-03-02 | Disposition: A | Discharge status: Home / Self Care | Payer: Medicare Other | Attending: Emergency Medicine | Admitting: Emergency Medicine

## 2016-03-01 ENCOUNTER — Encounter (HOSPITAL_COMMUNITY): Payer: Self-pay | Admitting: *Deleted

## 2016-03-01 DIAGNOSIS — Y999 Unspecified external cause status: Secondary | ICD-10-CM | POA: Insufficient documentation

## 2016-03-01 DIAGNOSIS — Z7984 Long term (current) use of oral hypoglycemic drugs: Secondary | ICD-10-CM | POA: Insufficient documentation

## 2016-03-01 DIAGNOSIS — Z7982 Long term (current) use of aspirin: Secondary | ICD-10-CM | POA: Insufficient documentation

## 2016-03-01 DIAGNOSIS — W0110XA Fall on same level from slipping, tripping and stumbling with subsequent striking against unspecified object, initial encounter: Secondary | ICD-10-CM | POA: Diagnosis not present

## 2016-03-01 DIAGNOSIS — I10 Essential (primary) hypertension: Secondary | ICD-10-CM | POA: Insufficient documentation

## 2016-03-01 DIAGNOSIS — S59912A Unspecified injury of left forearm, initial encounter: Secondary | ICD-10-CM | POA: Diagnosis present

## 2016-03-01 DIAGNOSIS — Y929 Unspecified place or not applicable: Secondary | ICD-10-CM | POA: Insufficient documentation

## 2016-03-01 DIAGNOSIS — S5012XA Contusion of left forearm, initial encounter: Secondary | ICD-10-CM | POA: Insufficient documentation

## 2016-03-01 DIAGNOSIS — Y939 Activity, unspecified: Secondary | ICD-10-CM | POA: Insufficient documentation

## 2016-03-01 DIAGNOSIS — E119 Type 2 diabetes mellitus without complications: Secondary | ICD-10-CM | POA: Diagnosis not present

## 2016-03-01 DIAGNOSIS — Z79899 Other long term (current) drug therapy: Secondary | ICD-10-CM | POA: Insufficient documentation

## 2016-03-01 LAB — CBC WITH DIFFERENTIAL/PLATELET
Basophils Absolute: 0 10*3/uL (ref 0.0–0.1)
Basophils Relative: 0 %
Eosinophils Absolute: 0.1 10*3/uL (ref 0.0–0.7)
Eosinophils Relative: 1 %
HCT: 36.8 % (ref 36.0–46.0)
Hemoglobin: 11.7 g/dL — ABNORMAL LOW (ref 12.0–15.0)
Lymphocytes Relative: 12 %
Lymphs Abs: 1.4 10*3/uL (ref 0.7–4.0)
MCH: 27.3 pg (ref 26.0–34.0)
MCHC: 31.8 g/dL (ref 30.0–36.0)
MCV: 85.8 fL (ref 78.0–100.0)
Monocytes Absolute: 1.3 10*3/uL — ABNORMAL HIGH (ref 0.1–1.0)
Monocytes Relative: 11 %
Neutro Abs: 9.1 10*3/uL — ABNORMAL HIGH (ref 1.7–7.7)
Neutrophils Relative %: 76 %
Platelets: 348 10*3/uL (ref 150–400)
RBC: 4.29 MIL/uL (ref 3.87–5.11)
RDW: 14.2 % (ref 11.5–15.5)
WBC: 11.9 10*3/uL — ABNORMAL HIGH (ref 4.0–10.5)

## 2016-03-01 LAB — BASIC METABOLIC PANEL
Anion gap: 9 (ref 5–15)
BUN: 10 mg/dL (ref 6–20)
CO2: 23 mmol/L (ref 22–32)
Calcium: 9.3 mg/dL (ref 8.9–10.3)
Chloride: 102 mmol/L (ref 101–111)
Creatinine, Ser: 0.77 mg/dL (ref 0.44–1.00)
GFR calc Af Amer: >60 mL/min (ref >60)
GFR calc non Af Amer: >60 mL/min (ref >60)
Glucose, Bld: 142 mg/dL — ABNORMAL HIGH (ref 65–99)
Potassium: 3.5 mmol/L (ref 3.5–5.1)
Sodium: 134 mmol/L — ABNORMAL LOW (ref 135–145)

## 2016-03-01 LAB — CBG MONITORING, ED: Glucose-Capillary: 130 mg/dL — ABNORMAL HIGH (ref 65–99)

## 2016-03-01 LAB — SEDIMENTATION RATE: Sed Rate: 28 mm/hr — ABNORMAL HIGH (ref 0–22)

## 2016-03-01 MED ORDER — TRAMADOL HCL 50 MG PO TABS
50.0000 mg | ORAL_TABLET | Freq: Once | ORAL | Status: AC
  Administered 2016-03-01: 50 mg via ORAL
  Filled 2016-03-01: qty 1

## 2016-03-01 NOTE — ED Notes (Signed)
Pt transported to xray 

## 2016-03-01 NOTE — ED Provider Notes (Signed)
[facility]-EMERGENCY DEPT Provider Note   CSN: [id number] Arrival date & time: 03/01/16  1827     History   Chief Complaint Chief Complaint  Patient presents with  . Arm Pain    HPI Paula Reed is a 70 y.o. female.  2 days ago, she tripped and fell against a wall hitting her left elbow against the wall. Since then, she is complaining of pain and swelling in that elbow. She then able to put a number on her pain. There was no other injury. She is not on any anticoagulants.   The history is provided by the patient and the spouse. The history is limited by the condition of the patient (Dementia).  Arm Pain     Past Medical History:  Diagnosis Date  . Allergic rhinitis   . Diabetes (HCC)   . Hemorrhoids   . Hyperlipidemia   . Hypertension   . Probable Alzheimer's dementia without behavioral disturbance 09/15/2015   May 2017 [facility]     Patient Active Problem List   Diagnosis Date Noted  . Probable Alzheimer's dementia without behavioral disturbance 09/15/2015  . Rash and nonspecific skin eruption 12/18/2014  . Rash 09/20/2014  . Bruit of right carotid artery 03/26/2014  . Back pain 07/06/2013  . Trigger finger 11/03/2012  . Heart murmur 04/22/2012  . Diabetes mellitus, type II (HCC) 02/02/2012  . Hypertension 02/02/2012  . Hyperlipidemia 02/02/2012  . Routine adult health maintenance 02/02/2012    Past Surgical History:  Procedure Laterality Date  . carpel tunnel release    . ECTOPIC PREGNANCY SURGERY      OB History    No data available       Home Medications    Prior to Admission medications   Medication Sig Start Date End Date Taking? Authorizing Provider  aspirin 81 MG tablet Take 81 mg by mouth daily. Reported on 10/10/2015    Historical Provider, MD  atorvastatin (LIPITOR) 40 MG tablet TAKE 1 TABLET (40 MG TOTAL) BY MOUTH DAILY. 12/03/14   Latrelle DodrillBrittany J McIntyre, MD  donepezil (ARICEPT) 5 MG tablet TAKE 1 TABLET (5 MG TOTAL) BY MOUTH AT BEDTIME.  01/15/16   Latrelle DodrillBrittany J McIntyre, MD  losartan (COZAAR) 100 MG tablet TAKE 1 TABLET BY MOUTH DAILY 05/08/14   Latrelle DodrillBrittany J McIntyre, MD  metFORMIN (GLUCOPHAGE) 1000 MG tablet TAKE 1 TABLET BY MOUTH TWICE A DAY WITH MEALS 12/04/15   Latrelle DodrillBrittany J McIntyre, MD  metoprolol tartrate (LOPRESSOR) 25 MG tablet TAKE 1 TABLET (25 MG TOTAL) BY MOUTH 2 (TWO) TIMES DAILY. 04/30/15   Latrelle DodrillBrittany J McIntyre, MD    Family History Family History  Problem Relation Age of Onset  . Heart disease Father   . Hypertension Father   . Hypertension Mother   . Colon cancer      aunt    Social History Social History  Substance Use Topics  . Smoking status: Never Smoker  . Smokeless tobacco: Not on file  . Alcohol use No     Allergies   Chlorthalidone   Review of Systems Review of Systems  Unable to perform ROS: Dementia     Physical Exam Updated Vital Signs BP (!) 159/109 (BP Location: Right Arm)   Pulse 107   Temp 98.7 F (37.1 C) (Oral)   Resp 18   SpO2 95%   Physical Exam  Nursing note and vitals reviewed.  70 year old female, resting comfortably and in no acute distress. Vital signs are Significant for hypertension  and mild tachycardia. Oxygen saturation is 95%, which is normal. Head is normocephalic and atraumatic. PERRLA, EOMI. Oropharynx is clear. Neck is nontender and supple without adenopathy or JVD. Back is nontender and there is no CVA tenderness. Lungs are clear without rales, wheezes, or rhonchi. Chest is nontender. Heart has regular rate and rhythm without murmur. Abdomen is soft, flat, nontender without masses or hepatosplenomegaly and peristalsis is normoactive. Extremities: There is swelling and tenderness of the proximal left forearm into the elbow area. There is pain with passive extension and mild pain with pronation supination. There is no pain elicited with passive flexion. Distal neurovascular exam is intact. . Skin is warm and dry without rash. Neurologic: Mental status is  normal, cranial nerves are intact, there are no motor or sensory deficits.  ED Treatments / Results  Labs (all labs ordered are listed, but only abnormal results are displayed) Labs Reviewed  BASIC METABOLIC PANEL - Abnormal; Notable for the following:       Result Value   Sodium 134 (*)    Glucose, Bld 142 (*)    All other components within normal limits  CBC WITH DIFFERENTIAL/PLATELET - Abnormal; Notable for the following:    WBC 11.9 (*)    Hemoglobin 11.7 (*)    Neutro Abs 9.1 (*)    Monocytes Absolute 1.3 (*)    All other components within normal limits  SEDIMENTATION RATE - Abnormal; Notable for the following:    Sed Rate 28 (*)    All other components within normal limits  CBG MONITORING, ED - Abnormal; Notable for the following:    Glucose-Capillary 130 (*)    All other components within normal limits    Radiology Dg Elbow Complete Left  Result Date: 03/01/2016 CLINICAL DATA:  Larey SeatFell 2 days ago. Pain and swelling over the posterior elbow. EXAM: LEFT ELBOW - COMPLETE 3+ VIEW COMPARISON:  None. FINDINGS: The joint spaces are maintained. No acute elbow fracture is identified. No osteochondral abnormality. No joint effusion. There is a moderate-sized olecranon spur and focal soft tissue swelling over the olecranon which could be a hematoma or acute traumatic olecranon bursitis. IMPRESSION: No acute fracture or joint effusion. Moderate-sized olecranon spur and overlying hematoma or traumatic olecranon bursitis. Electronically Signed   By: Rudie MeyerP.  Gallerani M.D.   On: 03/01/2016 20:18    Procedures Procedures (including critical care time)  Medications Ordered in ED Medications  traMADol (ULTRAM) tablet 50 mg (not administered)     Initial Impression / Assessment and Plan / ED Course  I have reviewed the triage vital signs and the nursing notes.  Pertinent labs & imaging results that were available during my care of the patient were reviewed by me and considered in my medical  decision making (see chart for details).  Clinical Course    Pain and swelling of the proximal left forearm and elbow area. She is being sent for x-rays to rule out fracture. Old records are reviewed and she has no relevant past visits.  X-rays are negative for fracture. Screening labs showed mild leukocytosis which is felt to be stress related, and essentially normal sedimentation rate. She's given a sling for comfort. She got good relief of pain with tramadol in the ED and is sent home with prescription for same. She is referred to orthopedics for follow-up.  Final Clinical Impressions(s) / ED Diagnoses   Final diagnoses:  Contusion of left forearm, initial encounter    New Prescriptions New Prescriptions   TRAMADOL (ULTRAM) 50  MG TABLET    Take 1 tablet (50 mg total) by mouth every 6 (six) hours as needed.     Dione Booze, MD 03/02/16 [phone]

## 2016-03-01 NOTE — ED Triage Notes (Signed)
Pt reports left elbow pain since this morning. Denies any injury. No swelling or redness noted at triage, warm to touch. Pt very vague with description.

## 2016-03-02 MED ORDER — TRAMADOL HCL 50 MG PO TABS: 50.0000 mg | ORAL_TABLET | Freq: Four times a day (QID) | ORAL | 0 refills | Status: DC | PRN

## 2016-03-02 NOTE — Discharge Instructions (Signed)
Apply ice several times a day. Keep your arm elevated. Wear the sling as needed. Follow up with the orthopedic doctor for further evaluation.

## 2016-03-05 ENCOUNTER — Telehealth: Payer: Self-pay | Admitting: Family Medicine

## 2016-03-05 ENCOUNTER — Other Ambulatory Visit: Payer: Self-pay | Admitting: Family Medicine

## 2016-03-05 NOTE — Telephone Encounter (Signed)
[facility] - please call patient's husband and let him know I have refilled her metformin but she needs to schedule an appointment to follow up on her diabetes. Thanks!  Latrelle DodrillBrittany J McIntyre, MD

## 2016-03-05 NOTE — Telephone Encounter (Signed)
Okay, thanks. I'll send them a letter.  Paula DodrillBrittany J Amarah Brossman, MD

## 2016-03-05 NOTE — Telephone Encounter (Signed)
Attempted to call pt but number is not in service. Paula Reed, CMA

## 2016-03-10 ENCOUNTER — Other Ambulatory Visit: Payer: Self-pay | Admitting: Family Medicine

## 2016-03-16 ENCOUNTER — Other Ambulatory Visit: Payer: Self-pay | Admitting: Family Medicine

## 2016-03-23 ENCOUNTER — Other Ambulatory Visit: Payer: Self-pay | Admitting: Family Medicine

## 2016-04-09 ENCOUNTER — Other Ambulatory Visit: Payer: Self-pay | Admitting: Family Medicine

## 2016-04-22 ENCOUNTER — Other Ambulatory Visit: Payer: Self-pay | Admitting: Family Medicine

## 2016-05-08 ENCOUNTER — Other Ambulatory Visit: Payer: Self-pay | Admitting: Family Medicine

## 2016-05-12 ENCOUNTER — Other Ambulatory Visit: Payer: Self-pay | Admitting: Family Medicine

## 2016-05-12 NOTE — Telephone Encounter (Signed)
Patient's husband came to office stated patient only has 3 pills left of the RX donepezil.  Can this be refilled?  Patient has an appt. Scheduled for 06/02/16. [facility] on [address]. Please let them know. [phone] if this can be done.

## 2016-05-13 MED ORDER — DONEPEZIL HCL 5 MG PO TABS: 5.0000 mg | ORAL_TABLET | Freq: Every day | ORAL | 0 refills | Status: DC

## 2016-05-13 NOTE — Telephone Encounter (Signed)
Will refill since she has an appointment scheduled. Latrelle DodrillBrittany J Nikhita Mentzel, MD

## 2016-05-21 ENCOUNTER — Other Ambulatory Visit: Payer: Self-pay | Admitting: Family Medicine

## 2016-06-02 ENCOUNTER — Ambulatory Visit (INDEPENDENT_AMBULATORY_CARE_PROVIDER_SITE_OTHER): Payer: Medicare Other | Admitting: Family Medicine

## 2016-06-02 VITALS — BP 152/78 | HR 81 | Temp 97.8°F | Ht 68.0 in | Wt 215.0 lb

## 2016-06-02 DIAGNOSIS — E2839 Other primary ovarian failure: Secondary | ICD-10-CM | POA: Diagnosis not present

## 2016-06-02 DIAGNOSIS — I10 Essential (primary) hypertension: Secondary | ICD-10-CM | POA: Diagnosis not present

## 2016-06-02 DIAGNOSIS — R21 Rash and other nonspecific skin eruption: Secondary | ICD-10-CM | POA: Diagnosis not present

## 2016-06-02 DIAGNOSIS — E119 Type 2 diabetes mellitus without complications: Secondary | ICD-10-CM | POA: Diagnosis not present

## 2016-06-02 DIAGNOSIS — Z23 Encounter for immunization: Secondary | ICD-10-CM | POA: Diagnosis not present

## 2016-06-02 DIAGNOSIS — F0280 Dementia in other diseases classified elsewhere without behavioral disturbance: Secondary | ICD-10-CM

## 2016-06-02 DIAGNOSIS — E785 Hyperlipidemia, unspecified: Secondary | ICD-10-CM | POA: Diagnosis not present

## 2016-06-02 DIAGNOSIS — G309 Alzheimer's disease, unspecified: Secondary | ICD-10-CM

## 2016-06-02 NOTE — Patient Instructions (Addendum)
Health maintenance: - schedule appointment for mammogram and bone density test (see handout) - pneumonia shot today - referring for eye exam, someone will call you with appointment - checking hepatitis C test with next set of labs  Go to [facility] one morning for labs to be drawn. This can be a walk-in visit. Take the lab order sheets with you Do not eat or drink anything other than water the morning of your lab appointment until after your labs are drawn. Will check liver, kidneys, cholesterol, diabetes A1c, and hepatitis C  I am referring you to a dermatologist for your rash. You will get a phone call to schedule this appointment.   Follow up with me in the next month to discuss dementia/memory in more detail.  Be well, Dr. Pollie MeyerMcIntyre

## 2016-06-02 NOTE — Progress Notes (Signed)
Date of Visit: 06/02/2016   HPI:  Patient presents for routine follow up. She is accompanied by her husband.  Diabetes - currently taking metformin 1000mg  twice daily. Due for eye exam. Tolerating metformin well  Hyperlipidemia - currently taking lipitor 40mg  daily, tolerating well. Due for lipids  Hypertension - currently taking losartan 100mg  daily. Denies chest pain or shortness of breath. No swelling.   Dementia - compliant with aricept 5mg  at night. Per husband, no wandering or behavioral issues. She does some cooking. Dresses herself and handles hygiene. Husband manages finances  Rash - chronic and persistent. No recent dermatology evaluation. Sometimes itchy.  ROS: See HPI.  PMFSH: history of type 2 diabetes, hypertension, hyperlipidemia, alzheimers  PHYSICAL EXAM: BP (!) 152/78   Pulse 81   Temp 97.8 F (36.6 C) (Oral)   Ht 5\' 8"  (1.727 m)   Wt 215 lb (97.5 kg)   SpO2 99%   BMI 32.69 kg/m  Gen: NAD, pleasant, cooperative HEENT: normocephalic, atraumatic, moist mucous membranes  Heart: regular rate and rhythm, no murmur heard Lungs: clear to auscultation bilaterally, normal work of breathing  Neuro: alert. Speech normal. Follows commands Ext: No appreciable lower extremity edema bilaterally  Skin: diffuse hyperpigmented rash on arms, with excoriation and some lichenification. Additional patch of similar hyperpigmented plaque on left forehead into scalp  ASSESSMENT/PLAN:  Health maintenance:  Hep c- draw with upcoming labs Eye - refer for exam (husband requested referral) Colonoscopy - defer to discuss at future visit DEXA - ordered, given handout on how to schedule mammo - given handout on how to schedule Foot exam - done today PCV13 - given today Flu shot - got 1/25 @ [facility], enter as historical admin  Diabetes mellitus, type II Check a1c with upcoming labs (to be done at [facility], due to current insurance issues with our lab here)  Cardiac: on  statin, aspirin. Check lipids Renal: on ARB Eye: refer for exam Foot: normal exam today Immunizations: PCV 13 today, otherwise UTD   Hypertension Blood pressure slightly above goal (152/78) on recheck but patient did not take blood pressure medication this morning. Will see what it is when she follows up for her dementia.  Hyperlipidemia Check lipids at fasting lab visit at [facility]  Rash and nonspecific skin eruption Remains present today, chronic. Now with some involvement of left forehead and scalp. Refer to dermatology - this rash is outside the scope of primary care. Patient & husband agreeable to this plan.  Probable Alzheimer's dementia without behavioral disturbance Instructed patient & husband to follow up with me in the next month to discuss her memory more. Will plan MOCA at that visit. Currently no behavioral issues, no wandering.  FOLLOW UP: Follow up within 1 month for dementia Get labs at [facility] Referring to ophtho for diabetic eye exam Referring to derm for rash  GrenadaBrittany J. Pollie MeyerMcIntyre, MD [facility]

## 2016-06-04 ENCOUNTER — Other Ambulatory Visit: Payer: Self-pay | Admitting: Family Medicine

## 2016-06-04 DIAGNOSIS — Z1231 Encounter for screening mammogram for malignant neoplasm of breast: Secondary | ICD-10-CM

## 2016-06-04 LAB — BASIC METABOLIC PANEL
BUN: 11 mg/dL (ref 4–21)
Creatinine: 0.7 mg/dL (ref 0.5–1.1)
Glucose: 139 mg/dL
Potassium: 4.7 mmol/L (ref 3.4–5.3)
Sodium: 141 mmol/L (ref 137–147)

## 2016-06-04 LAB — LIPID PANEL
Cholesterol: 127 mg/dL (ref 0–200)
HDL: 45 mg/dL (ref 35–70)
LDL Cholesterol: 62 mg/dL
Triglycerides: 99 mg/dL (ref 40–160)

## 2016-06-04 LAB — HEPATIC FUNCTION PANEL
ALT: 14 U/L (ref 7–35)
AST: 22 U/L (ref 13–35)
Alkaline Phosphatase: 84 U/L (ref 25–125)
Bilirubin, Total: 0.7 mg/dL

## 2016-06-04 LAB — HEMOGLOBIN A1C: Hemoglobin A1C: 6.5

## 2016-06-04 NOTE — Assessment & Plan Note (Signed)
Check a1c with upcoming labs (to be done at [facility], due to current insurance issues with our lab here)  Cardiac: on statin, aspirin. Check lipids Renal: on ARB Eye: refer for exam Foot: normal exam today Immunizations: PCV 13 today, otherwise UTD

## 2016-06-04 NOTE — Assessment & Plan Note (Signed)
Remains present today, chronic. Now with some involvement of left forehead and scalp. Refer to dermatology - this rash is outside the scope of primary care. Patient & husband agreeable to this plan.

## 2016-06-04 NOTE — Assessment & Plan Note (Signed)
Check lipids at fasting lab visit at [facility]

## 2016-06-04 NOTE — Addendum Note (Signed)
Addended by: Garen GramsBENTON, Aleksa Catterton F on: 06/04/2016 12:13 PM   Modules accepted: Orders

## 2016-06-04 NOTE — Assessment & Plan Note (Signed)
Instructed patient & husband to follow up with me in the next month to discuss her memory more. Will plan MOCA at that visit. Currently no behavioral issues, no wandering.

## 2016-06-04 NOTE — Assessment & Plan Note (Signed)
Blood pressure slightly above goal (152/78) on recheck but patient did not take blood pressure medication this morning. Will see what it is when she follows up for her dementia.

## 2016-06-05 LAB — COMPREHENSIVE METABOLIC PANEL
ALT: 14 IU/L (ref 0–32)
AST: 22 IU/L (ref 0–40)
Albumin/Globulin Ratio: 1.2 (ref 1.2–2.2)
Albumin: 4.1 g/dL (ref 3.6–4.8)
Alkaline Phosphatase: 84 IU/L (ref 39–117)
BUN/Creatinine Ratio: 15 (ref 12–28)
BUN: 11 mg/dL (ref 8–27)
Bilirubin Total: 0.7 mg/dL (ref 0.0–1.2)
CO2: 22 mmol/L (ref 18–29)
Calcium: 9.7 mg/dL (ref 8.7–10.3)
Chloride: 100 mmol/L (ref 96–106)
Creatinine, Ser: 0.73 mg/dL (ref 0.57–1.00)
GFR calc Af Amer: 97 mL/min/{1.73_m2} (ref >59)
GFR calc non Af Amer: 84 mL/min/{1.73_m2} (ref >59)
Globulin, Total: 3.4 g/dL (ref 1.5–4.5)
Glucose: 139 mg/dL — ABNORMAL HIGH (ref 65–99)
Potassium: 4.7 mmol/L (ref 3.5–5.2)
Sodium: 141 mmol/L (ref 134–144)
Total Protein: 7.5 g/dL (ref 6.0–8.5)

## 2016-06-05 LAB — LIPID PANEL
Chol/HDL Ratio: 2.8 ratio units (ref 0.0–4.4)
Cholesterol, Total: 127 mg/dL (ref 100–199)
HDL: 45 mg/dL (ref >39)
LDL Calculated: 62 mg/dL (ref 0–99)
Triglycerides: 99 mg/dL (ref 0–149)
VLDL Cholesterol Cal: 20 mg/dL (ref 5–40)

## 2016-06-05 LAB — HEMOGLOBIN A1C
Est. average glucose Bld gHb Est-mCnc: 140 mg/dL
Hgb A1c MFr Bld: 6.5 % — ABNORMAL HIGH (ref 4.8–5.6)

## 2016-06-05 LAB — HEPATITIS C ANTIBODY: Hep C Virus Ab: 0.1 s/co ratio (ref 0.0–0.9)

## 2016-06-10 ENCOUNTER — Telehealth: Payer: Self-pay | Admitting: Family Medicine

## 2016-06-10 NOTE — Telephone Encounter (Signed)
Husband came in today stated concerned seeing side effect of medication atorvastatin 40 mg tab.   Stated he is very concern would like to call him back or nurse.

## 2016-06-10 NOTE — Telephone Encounter (Signed)
This rash has been present for a long time - I have seen her intermittently over the years for it and have tried to get her to follow up with dermatology for it  If they want to do a trial off atorvastatin to see if it improves this is reasonable- Dr. Caroleen Hammanumley if you want to give them the green light to hold atorvastatin this is fine with me.  Latrelle DodrillBrittany J McIntyre, MD

## 2016-06-10 NOTE — Telephone Encounter (Signed)
Spoke with patient and husband regarding possible side effects of medication.  Patient's husband stated that patient has dark brown spots all over her arms and face.  The rash is very itchy.  He thinks it is coming from Atorvastatin.  Reviewed patient's medication history, patient has been on medication for a while.  Pt's husband asked how long patient has been having the symptoms, he could not give a definite answer only "for a while."  He also stated patient has history of dementia and think that could be caused by the medication as well.  He also reported that patient is having urinary frequency, urgency and odor.  Denies any breathing problems with medication. Appt was scheduled for tomorrow afternoon with Dr. Caroleen Hammanumley. Will forward to PCP and Dr. Caroleen Hammanumley.  [name].emmd

## 2016-06-11 ENCOUNTER — Encounter: Payer: Self-pay | Admitting: Family Medicine

## 2016-06-11 ENCOUNTER — Other Ambulatory Visit: Payer: Self-pay | Admitting: Family Medicine

## 2016-06-11 ENCOUNTER — Ambulatory Visit (INDEPENDENT_AMBULATORY_CARE_PROVIDER_SITE_OTHER): Payer: Medicare Other | Admitting: Family Medicine

## 2016-06-11 VITALS — BP 150/76 | HR 71 | Temp 97.8°F | Wt 216.6 lb

## 2016-06-11 DIAGNOSIS — E119 Type 2 diabetes mellitus without complications: Secondary | ICD-10-CM

## 2016-06-11 DIAGNOSIS — R21 Rash and other nonspecific skin eruption: Secondary | ICD-10-CM

## 2016-06-11 DIAGNOSIS — R300 Dysuria: Secondary | ICD-10-CM

## 2016-06-11 LAB — POCT URINALYSIS DIPSTICK
Blood, UA: NEGATIVE
Glucose, UA: NEGATIVE
Leukocytes, UA: NEGATIVE
Nitrite, UA: NEGATIVE
Protein, UA: 30
Spec Grav, UA: 1.02
Urobilinogen, UA: >=8
pH, UA: 8.5

## 2016-06-11 NOTE — Patient Instructions (Signed)
Thank you so much for coming to visit today! You may hold the Atorvastatin at this time. We will check your A1C. Please go to [facility] across the street to have your kidney function checked. Please follow up with Dermatology as scheduled. Please return to see Dr. Pollie MeyerMcIntyre for your diabetes.  Dr. Caroleen Hammanumley

## 2016-06-12 ENCOUNTER — Other Ambulatory Visit: Payer: Self-pay | Admitting: Family Medicine

## 2016-06-12 LAB — HEPATITIS C ANTIBODY: HCV Ab: 0.1

## 2016-06-14 NOTE — Progress Notes (Signed)
Subjective:     Patient ID: Paula Reed, female   DOB: [birth year], 71 y.o.   MRN: [id number]  HPI Mrs. Paula Reed is a 71yo female presenting today for concerns of adverse reactions of Atorvastatin. Has been on Atorvastatin for several years. Husband noted in list of adverse effects, it mentions confusion and rash. History of suspected dementia noted. Rash present on arms, legs, and face. Unable to quantify how long rash has been present. Rash itches. Reports she has appointment with Dermatology tomorrow 2/16, with Dr. Doreen Reed. Has been using Cortisone and Triderm without relief. Would like kidneys checked today since he has also read Atorvastatin can affect kidney. Last A1C 08/2015 6.2. Currently on Metformin 1000mg  twice daily.   Review of Systems Per HPI    Objective:   Physical Exam  Constitutional: She appears well-developed and well-nourished. No distress.  HENT:  Mouth/Throat: Oropharynx is clear and moist. No oropharyngeal exudate.  Cardiovascular: Normal rate and regular rhythm.   No murmur heard. Pulmonary/Chest: Effort normal. No respiratory distress. She has no wheezes.  Abdominal: Soft. She exhibits no distension. There is no tenderness.  Skin:  Patches of hyperpigmentation noted on extensor surfaces of both arms. Maculopapular rash with excoriations noted on arms, legs, and face.      Assessment and Plan:     1. Rash Patient and husband believes secondary to Atorvastatin. Will discontinue at this time. Follow up with Dermatology 06/15/16. Consider restarting once medication ruled out. Will check BMP and Urinalysis. Follow up with PCP following dermatology appointment.

## 2016-06-16 ENCOUNTER — Ambulatory Visit
Admission: RE | Admit: 2016-06-16 | Discharge: 2016-06-16 | Disposition: A | Discharge status: Home / Self Care | Payer: Medicare Other | Source: Ambulatory Visit | Attending: Family Medicine | Admitting: Family Medicine

## 2016-06-16 DIAGNOSIS — Z1231 Encounter for screening mammogram for malignant neoplasm of breast: Secondary | ICD-10-CM

## 2016-06-16 DIAGNOSIS — E2839 Other primary ovarian failure: Secondary | ICD-10-CM

## 2016-06-17 ENCOUNTER — Encounter: Payer: Self-pay | Admitting: Family Medicine

## 2016-06-19 ENCOUNTER — Encounter: Payer: Self-pay | Admitting: Family Medicine

## 2016-06-23 ENCOUNTER — Other Ambulatory Visit: Payer: Self-pay | Admitting: Family Medicine

## 2016-06-25 LAB — BASIC METABOLIC PANEL
BUN/Creatinine Ratio: 18 (ref 12–28)
BUN: 13 mg/dL (ref 8–27)
CO2: 26 mmol/L (ref 18–29)
Calcium: 9.8 mg/dL (ref 8.7–10.3)
Chloride: 102 mmol/L (ref 96–106)
Creatinine, Ser: 0.71 mg/dL (ref 0.57–1.00)
GFR calc Af Amer: 100 mL/min/{1.73_m2} (ref >59)
GFR calc non Af Amer: 87 mL/min/{1.73_m2} (ref >59)
Glucose: 80 mg/dL (ref 65–99)
Potassium: 4.2 mmol/L (ref 3.5–5.2)
Sodium: 134 mmol/L (ref 134–144)

## 2016-06-25 NOTE — Telephone Encounter (Signed)
2nd request.  Adisen Bennion L, RN  

## 2016-07-06 ENCOUNTER — Other Ambulatory Visit: Payer: Self-pay | Admitting: Family Medicine

## 2016-08-10 ENCOUNTER — Other Ambulatory Visit: Payer: Self-pay | Admitting: *Deleted

## 2016-08-10 NOTE — Telephone Encounter (Signed)
Pharmacy is requesting a 90 day supply of patient's metoprolol.  Jaquavion Mccannon,CMA

## 2016-08-11 ENCOUNTER — Other Ambulatory Visit: Payer: Self-pay | Admitting: *Deleted

## 2016-08-11 NOTE — Telephone Encounter (Signed)
Refill request for 90 day supply.  Martin, Tamika L, RN  

## 2016-08-11 NOTE — Telephone Encounter (Signed)
2nd request.  Ted Goodner L, RN  

## 2016-08-12 MED ORDER — METOPROLOL TARTRATE 25 MG PO TABS: ORAL_TABLET | ORAL | 0 refills | Status: DC

## 2016-08-12 MED ORDER — DONEPEZIL HCL 5 MG PO TABS: 5.0000 mg | ORAL_TABLET | Freq: Every day | ORAL | 1 refills | Status: DC

## 2016-10-30 ENCOUNTER — Ambulatory Visit: Payer: Medicare Other | Admitting: Family Medicine

## 2016-12-30 ENCOUNTER — Other Ambulatory Visit: Payer: Self-pay | Admitting: Family Medicine

## 2017-01-07 LAB — HM DIABETES EYE EXAM

## 2017-02-02 ENCOUNTER — Encounter: Payer: Self-pay | Admitting: Family Medicine

## 2017-02-06 ENCOUNTER — Other Ambulatory Visit: Payer: Self-pay | Admitting: Family Medicine

## 2017-02-15 IMAGING — MR MR HEAD W/O CM
9 of 10 series · 35 of 48 positions shown · non-contrast
Comparison: Head CTA 05/26/2014

CLINICAL DATA: Cognitive impairment.  New diagnosis of dementia.

EXAM:
MRI HEAD WITHOUT CONTRAST
TECHNIQUE: Multiplanar, multiecho pulse sequences of the brain and surrounding
structures were obtained without intravenous contrast.

[Series 3: DWI · axial · 3.0mm · 0.94mm/px · z∈[-100,+26]mm · 8 of 87 slices shown (1 of 2)]
[im 1/87]
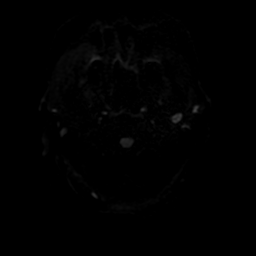
[im 10/87]
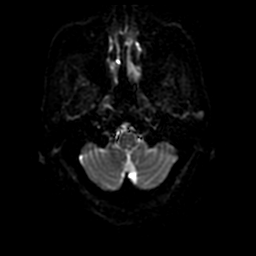
[im 29/87]
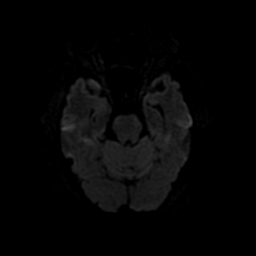
[im 39/87]
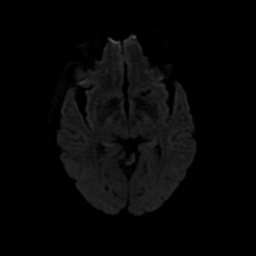
[im 48/87]
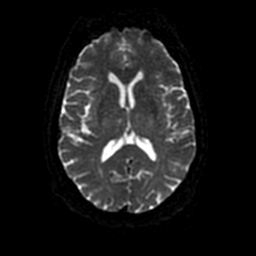
[im 58/87]
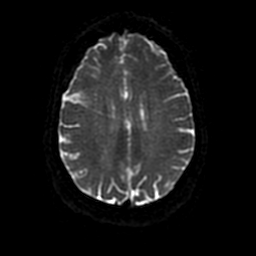
[im 77/87]
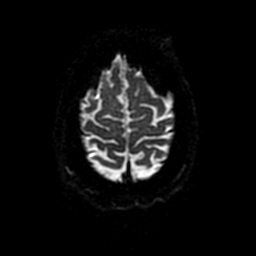
[im 87/87]
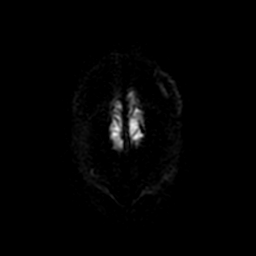

[Series 5: FLAIR · sagittal · 5.0mm · 0.47mm/px · 2 of 23 slices shown (1 of 2)]
[im 1/23]
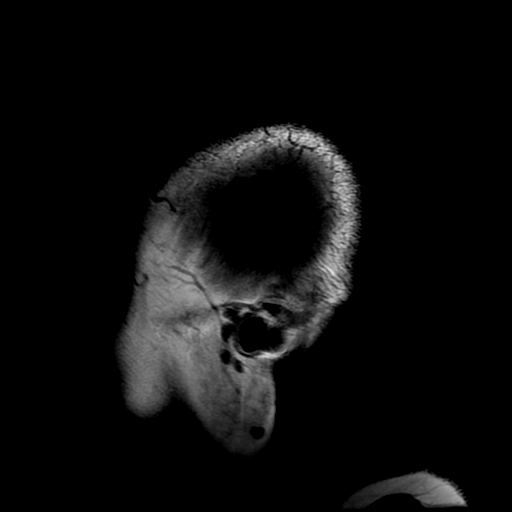
[im 23/23]
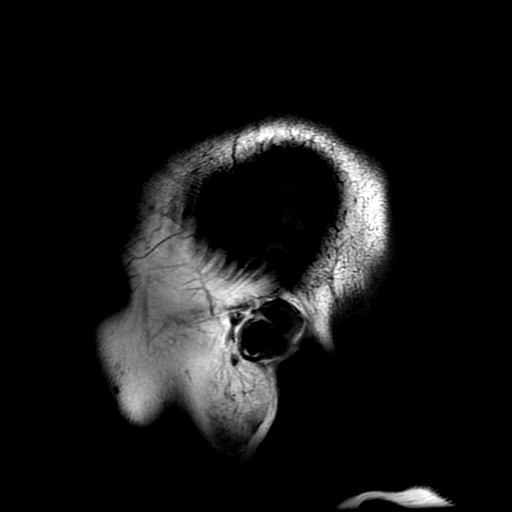

[Series 6: T2 · axial · 5.0mm · 0.47mm/px · z∈[-98,+25]mm · 2 of 22 slices shown (1 of 2)]
[im 1/22]
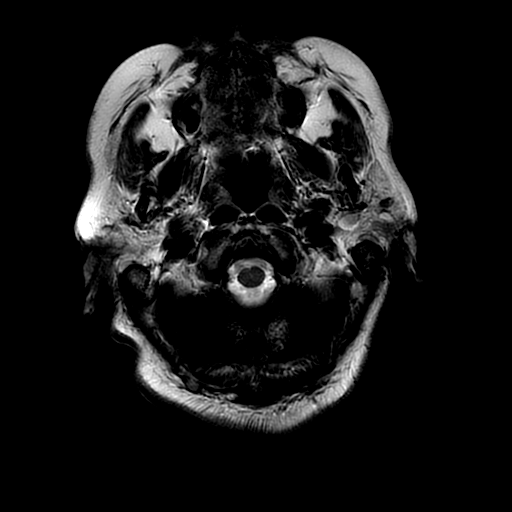
[im 22/22]
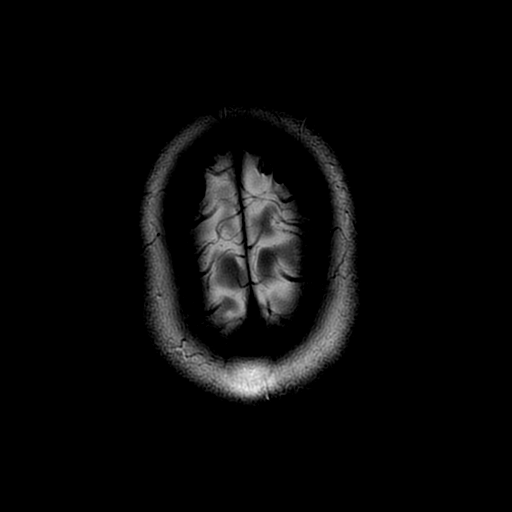

[Series 7: T2 · coronal · 5.0mm · 0.39mm/px · 3 of 28 slices shown (2 of 2)]
[im 1/28]
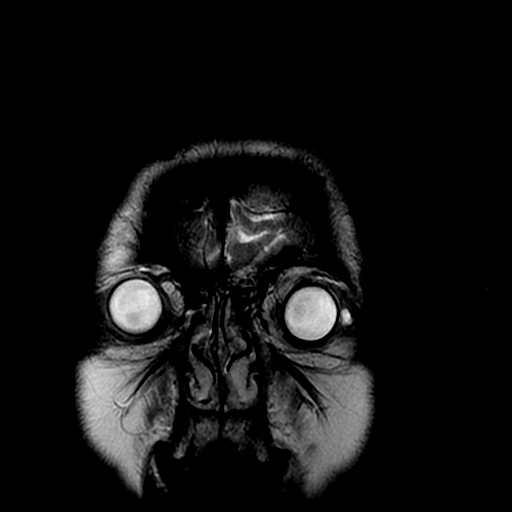
[im 14/28]
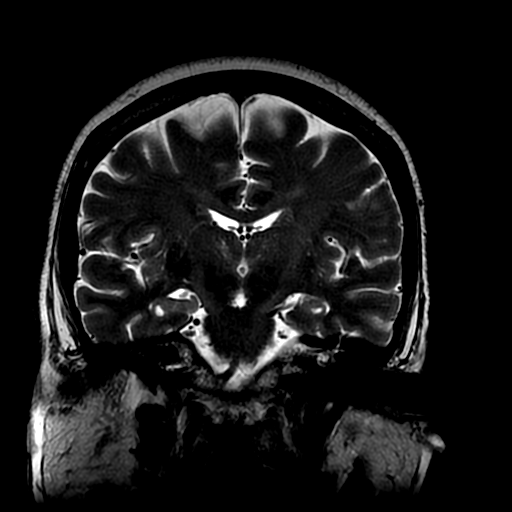
[im 28/28]
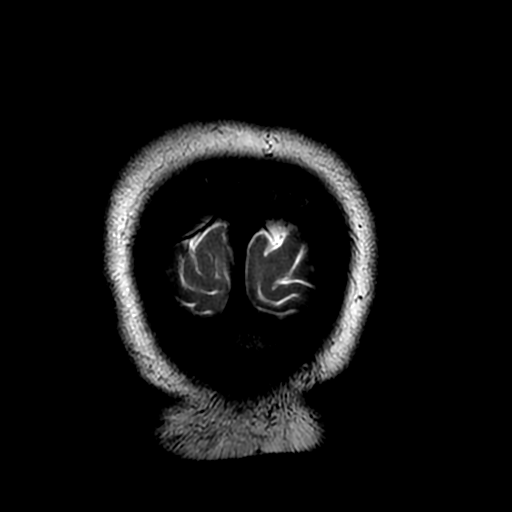

[Series 8: FLAIR · axial · 5.0mm · 0.47mm/px · z∈[-98,+25]mm · 2 of 22 slices shown (2 of 2)]
[im 1/22]
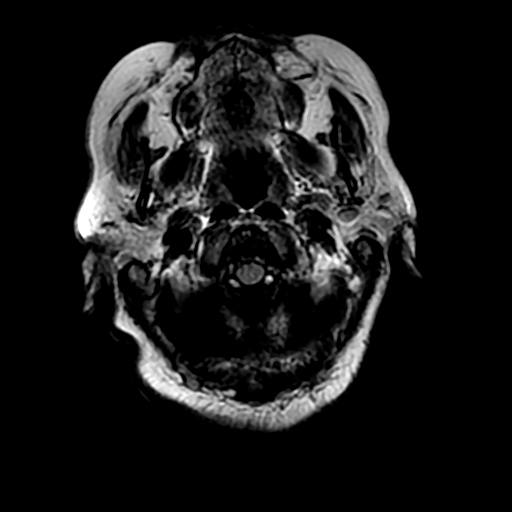
[im 22/22]
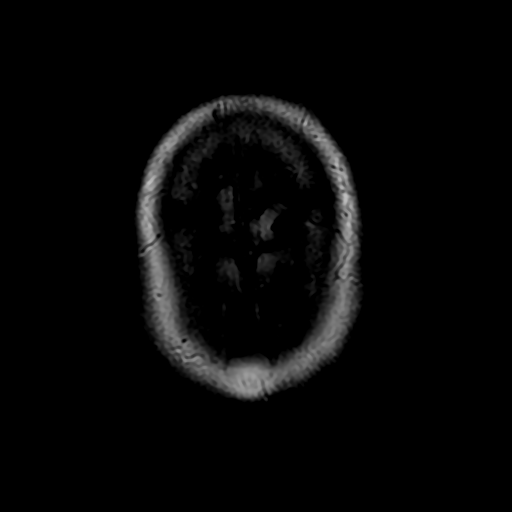

[Series 9: DWI · coronal · 4.0mm · 0.94mm/px · 7 of 66 slices shown (2 of 2)]
[im 1/66]
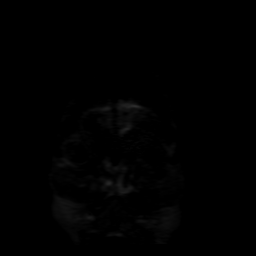
[im 11/66]
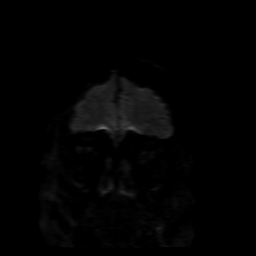
[im 22/66]
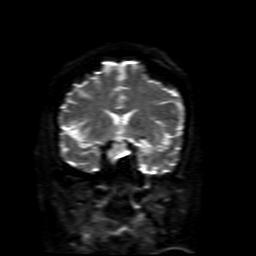
[im 33/66]
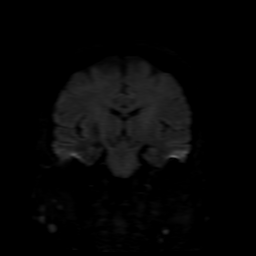
[im 44/66]
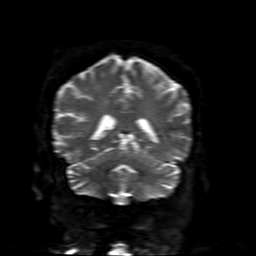
[im 55/66]
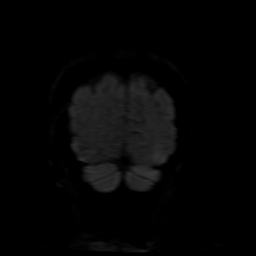
[im 66/66]
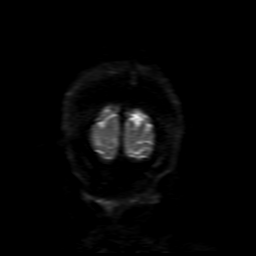

[Series 10: (person_name) · axial · 3.0mm · 0.47mm/px · z∈[-100,-69]mm · 3 of 88 slices shown]
[im 1/88]
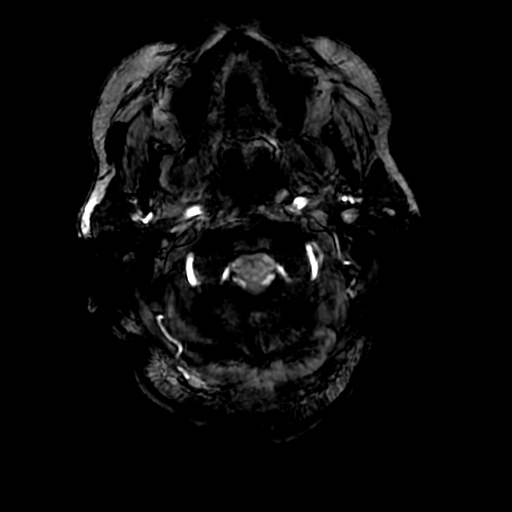
[im 11/88]
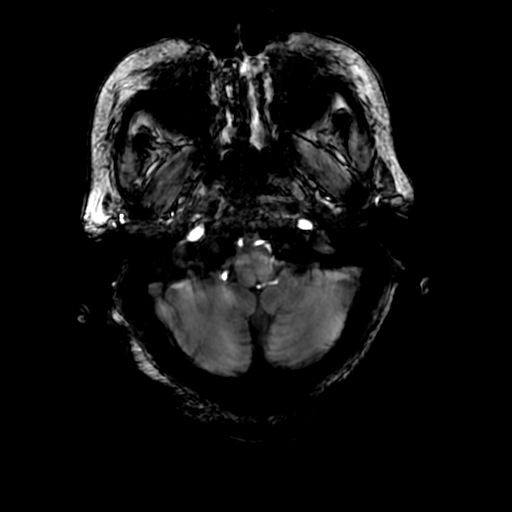
[im 22/88]
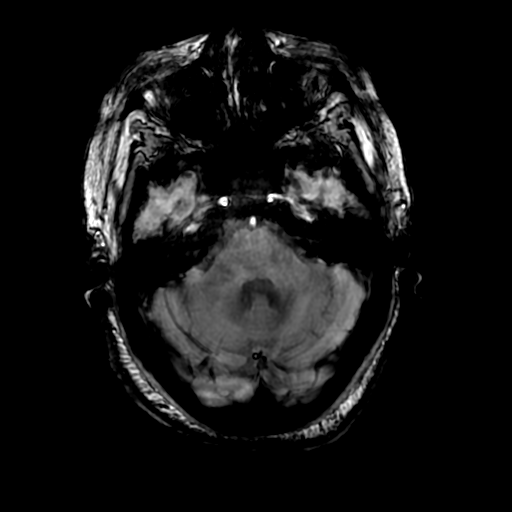

[Series 350: ADC · axial · 3.0mm · 0.94mm/px · z∈[-100,+26]mm · 5 of 44 slices shown (1 of 2)]
[im 1/44]
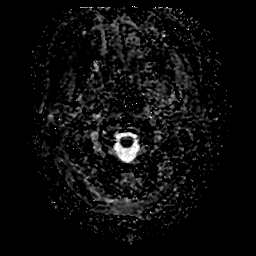
[im 11/44]
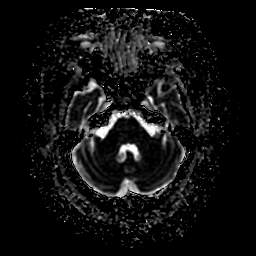
[im 22/44]
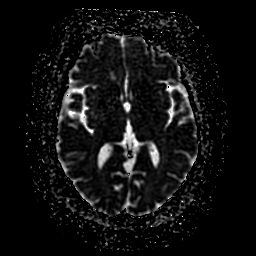
[im 33/44]
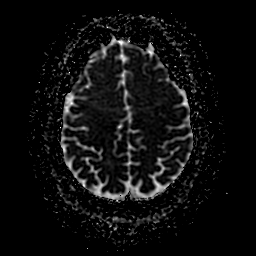
[im 44/44]
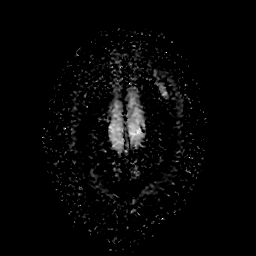

[Series 950: ADC · coronal · 4.0mm · 0.94mm/px · 3 of 33 slices shown (2 of 2)]
[im 1/33]
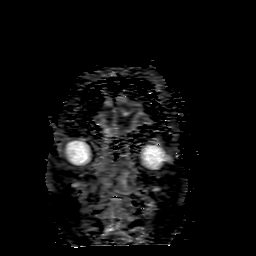
[im 17/33]
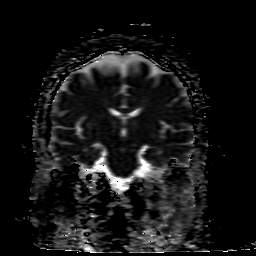
[im 33/33]
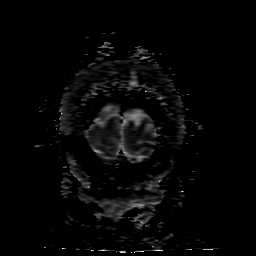

[35 of 48 positions shown; findings below may reference images not displayed]

FINDINGS: There is a moderately expanded partially empty sella. The cerebellar
tonsils are normally positioned. There is no evidence of acute
infarct, intracranial hemorrhage, mass, midline shift, or
extra-axial fluid collection. Ventricles and sulci are normal for
age. Small foci of T2 hyperintensity scattered throughout the
subcortical and periventricular cerebral white matter bilaterally
are nonspecific but compatible with mild chronic small vessel
ischemic disease.

Orbits are unremarkable. Minimal paranasal sinus mucosal thickening
is noted. The mastoid air cells are clear. Major intracranial
vascular flow voids are preserved. A small midline parietal scalp
lipoma measures 2.1 x 0.6 cm.
IMPRESSION: 1. No acute intracranial abnormality.
2. Mild chronic small vessel ischemic disease.
3. Partially empty sella.

## 2017-03-05 ENCOUNTER — Other Ambulatory Visit: Payer: Self-pay | Admitting: Family Medicine

## 2017-03-05 ENCOUNTER — Other Ambulatory Visit: Payer: Self-pay

## 2017-03-05 ENCOUNTER — Ambulatory Visit (INDEPENDENT_AMBULATORY_CARE_PROVIDER_SITE_OTHER): Payer: Medicare Other | Admitting: Family Medicine

## 2017-03-05 ENCOUNTER — Encounter: Payer: Self-pay | Admitting: Family Medicine

## 2017-03-05 VITALS — BP 138/82 | HR 69 | Temp 98.4°F | Ht 68.0 in | Wt 219.0 lb

## 2017-03-05 DIAGNOSIS — R21 Rash and other nonspecific skin eruption: Secondary | ICD-10-CM

## 2017-03-05 DIAGNOSIS — F0280 Dementia in other diseases classified elsewhere without behavioral disturbance: Secondary | ICD-10-CM | POA: Diagnosis not present

## 2017-03-05 DIAGNOSIS — E119 Type 2 diabetes mellitus without complications: Secondary | ICD-10-CM | POA: Diagnosis not present

## 2017-03-05 DIAGNOSIS — G309 Alzheimer's disease, unspecified: Secondary | ICD-10-CM

## 2017-03-05 DIAGNOSIS — I10 Essential (primary) hypertension: Secondary | ICD-10-CM

## 2017-03-05 LAB — POCT GLYCOSYLATED HEMOGLOBIN (HGB A1C): Hemoglobin A1C: 6.2

## 2017-03-05 MED ORDER — TRIAMCINOLONE ACETONIDE 0.1 % EX CREA: 1.0000 "application " | TOPICAL_CREAM | Freq: Two times a day (BID) | CUTANEOUS | 1 refills | Status: DC

## 2017-03-05 MED ORDER — DONEPEZIL HCL 5 MG PO TABS: 5.0000 mg | ORAL_TABLET | Freq: Every day | ORAL | 5 refills | Status: DC

## 2017-03-05 MED ORDER — TRIAMCINOLONE ACETONIDE 0.1 % EX CREA
1.0000 | TOPICAL_CREAM | Freq: Two times a day (BID) | CUTANEOUS | 1 refills | Status: DC
Start: 2017-03-05 — End: 2017-03-05

## 2017-03-05 NOTE — Assessment & Plan Note (Signed)
Biopsy suggested possible drug reaction.  Did not improve after atorvastatin was discontinued.  We will try to sequentially discontinue her medications to see if we can get the rash to improve.  Start by stopping metformin given A1c of 6.2 today.  Has been has been instructed to call back if it is not improved in 3-4 weeks, at which time we will stop another of her medications.  She will follow-up in person in 3 months.  Prescribed triamcinolone cream for symptomatic relief.  Could consider systemic steroids in the future, or a systemic antihistamine, but I am hesitant to add additional medications at this time since we are trying to taper off of some of her meds.

## 2017-03-05 NOTE — Progress Notes (Signed)
Date of Visit: 03/05/2017   HPI:  Paula Reed is a 71 year old female with PMH of dementia here for routine follow up.  She is accompanied by her husband.  Hyperlipidemia: Stopped her atorvastatin earlier this year after thinking it might have contributed to her rash.  Has been voices concerns about statins causing heart attacks and strokes.  He does not want her to be on a statin moving forward.  Hypertension: Currently taking metoprolol 25 mg twice daily.  Has had losartan prescribed but is not taking it.  Denies any chest pain, shortness of breath, or lower extremity edema.  Diabetes: Currently taking metformin 1000 mg twice daily.  Tolerating this fine.  Rash:  Has history of a diffuse rash that has been ongoing for over one year. She has been seen by dermatology in February, who recommended a trial of discontinuation of each of her medications. A skin biopsy done at the derm office suggested a possible drug reaction. She discontinued her Atorvastatin which she believed to be causing the rash, but it has not resolved.  Patient reports that she is continuing to itch, specifically her arms and back. They are currently using "dermacream" for itch and pain without results.  She denies any other symptoms at this time.   Dementia: Continues on donepezil at this time.  Husband denies that she has been wandering or posing any risk to herself at home.  ROS: See HPI.  PMFSH: History of type 2 diabetes, hypertension, hyperlipidemia, dementia  PHYSICAL EXAM: BP 138/82   Pulse 69   Temp 98.4 F (36.9 C) (Oral)   Ht 5\' 8"  (1.727 m)   Wt 219 lb (99.3 kg)   SpO2 99%   BMI 33.30 kg/m  Gen: Well appearing female, NAD HEENT: Hyperpigmented papules surrounding left eye and forehead. Normocephalic. Heart: RRR, no murmurs, rubs, or gallops appreciated. Lungs: CTAB, no wheezes, crackles, or rhonchi.  Neuro: No focal deficits.  Ext: Diffuse hyperpigmented macules and papules on extensor surfaces of  extremities and back with lichenification and some excoriation.  Dry flaking mildly erythematous rash present on posterior aspect of arms near the axilla bilaterally.  ASSESSMENT/PLAN:  Health maintenance:  -Past due for colonoscopy, however husband believes that they may have had done stool testing through nurse practitioner that visited their home to their insurance company.  He will try to find the records and have them sent to us.  Rash and nonspecific skin eruption Biopsy suggested possible drug reaction.  Did not improve after atorvastatin was discontinued.  We will try to sequentially discontinue her medications to see if we can get the rash to improve.  Start by stopping metformin given A1c of 6.2 today.  Has been has been instructed to call back if it is not improved in 3-4 weeks, at which time we will stop another of her medications.  She will follow-up in person in 3 months.  Prescribed triamcinolone cream for symptomatic relief.  Could consider systemic steroids in the future, or a systemic antihistamine, but I am hesitant to add additional medications at this time since we are trying to taper off of some of her meds.  Diabetes mellitus, type II A1c 6.2 today.  We are stopping metformin to see if it helps with her rash to come off this medicine.  Cardiac: on aspirin, not on statin per husband's preference Renal: not on ACE or ARB at present, consider urine microalbumin at future visit Eye: UTD Foot: UTD Immunizations: UTD   Hypertension Well-controlled  on metoprolol.  Continue current regimen.  Probable Alzheimer's dementia without behavioral disturbance Continues to be safe at home, no wandering behaviors or risk of fire issues in the kitchen.  Continue to monitor.   FOLLOW UP: Follow up in 3 months.   Jerrilyn CairoKelly Tackett, MS3 [facility]  Patient seen along with MS3 student Jerrilyn CairoKelly Tackett. I personally evaluated this patient along with the student, and verified  all aspects of the history, physical exam, and medical decision making as documented by the student. I agree with the student's documentation and have made all necessary edits.   Levert FeinsteinBrittany Briggs Edelen, MD [facility]

## 2017-03-05 NOTE — Assessment & Plan Note (Signed)
A1c 6.2 today.  We are stopping metformin to see if it helps with her rash to come off this medicine.  Cardiac: on aspirin, not on statin per husband's preference Renal: not on ACE or ARB at present, consider urine microalbumin at future visit Eye: UTD Foot: UTD Immunizations: UTD

## 2017-03-05 NOTE — Assessment & Plan Note (Signed)
Continues to be safe at home, no wandering behaviors or risk of fire issues in the kitchen.  Continue to monitor.

## 2017-03-05 NOTE — Assessment & Plan Note (Signed)
Well-controlled on metoprolol.  Continue current regimen.

## 2017-03-05 NOTE — Patient Instructions (Signed)
It was great to see you again today!  Stop the metformin.  Let us see if this helps with the rash. Call me in 3-4 weeks so we can see if we need to change her medicines further.  Schedule a follow-up visit in 3 months.  I am sending in a cream to use on the rash.  Be well, Dr. Pollie MeyerMcIntyre

## 2017-03-24 ENCOUNTER — Other Ambulatory Visit: Payer: Self-pay | Admitting: Family Medicine

## 2017-03-24 MED ORDER — METOPROLOL TARTRATE 25 MG PO TABS: ORAL_TABLET | ORAL | 1 refills | Status: DC

## 2017-03-29 ENCOUNTER — Other Ambulatory Visit: Payer: Self-pay | Admitting: Family Medicine

## 2017-04-09 ENCOUNTER — Other Ambulatory Visit: Payer: Self-pay | Admitting: Family Medicine

## 2017-04-09 MED ORDER — METOPROLOL TARTRATE 25 MG PO TABS: ORAL_TABLET | ORAL | 1 refills | Status: DC

## 2017-06-05 ENCOUNTER — Emergency Department (HOSPITAL_COMMUNITY): Payer: Medicare Other

## 2017-06-05 ENCOUNTER — Inpatient Hospital Stay (HOSPITAL_COMMUNITY)
Admission: EM | Admit: 2017-06-05 | Discharge: 2017-06-09 | DRG: 100 | Disposition: A | Discharge status: Home Health | Payer: Medicare Other | Attending: Pulmonary Disease | Admitting: Pulmonary Disease

## 2017-06-05 ENCOUNTER — Inpatient Hospital Stay (HOSPITAL_COMMUNITY): Payer: Medicare Other

## 2017-06-05 ENCOUNTER — Encounter (HOSPITAL_COMMUNITY): Payer: Self-pay

## 2017-06-05 DIAGNOSIS — Z6833 Body mass index (BMI) 33.0-33.9, adult: Secondary | ICD-10-CM

## 2017-06-05 DIAGNOSIS — E785 Hyperlipidemia, unspecified: Secondary | ICD-10-CM | POA: Diagnosis present

## 2017-06-05 DIAGNOSIS — R739 Hyperglycemia, unspecified: Secondary | ICD-10-CM | POA: Diagnosis not present

## 2017-06-05 DIAGNOSIS — E872 Acidosis: Secondary | ICD-10-CM | POA: Diagnosis present

## 2017-06-05 DIAGNOSIS — F0390 Unspecified dementia without behavioral disturbance: Secondary | ICD-10-CM | POA: Diagnosis present

## 2017-06-05 DIAGNOSIS — Z7982 Long term (current) use of aspirin: Secondary | ICD-10-CM

## 2017-06-05 DIAGNOSIS — G9340 Encephalopathy, unspecified: Secondary | ICD-10-CM | POA: Diagnosis not present

## 2017-06-05 DIAGNOSIS — Z87891 Personal history of nicotine dependence: Secondary | ICD-10-CM

## 2017-06-05 DIAGNOSIS — G40911 Epilepsy, unspecified, intractable, with status epilepticus: Principal | ICD-10-CM | POA: Diagnosis present

## 2017-06-05 DIAGNOSIS — Z9289 Personal history of other medical treatment: Secondary | ICD-10-CM

## 2017-06-05 DIAGNOSIS — D72829 Elevated white blood cell count, unspecified: Secondary | ICD-10-CM | POA: Diagnosis present

## 2017-06-05 DIAGNOSIS — J189 Pneumonia, unspecified organism: Secondary | ICD-10-CM | POA: Diagnosis not present

## 2017-06-05 DIAGNOSIS — E876 Hypokalemia: Secondary | ICD-10-CM | POA: Diagnosis present

## 2017-06-05 DIAGNOSIS — J9600 Acute respiratory failure, unspecified whether with hypoxia or hypercapnia: Secondary | ICD-10-CM | POA: Diagnosis present

## 2017-06-05 DIAGNOSIS — Z8612 Personal history of poliomyelitis: Secondary | ICD-10-CM | POA: Diagnosis not present

## 2017-06-05 DIAGNOSIS — Z79899 Other long term (current) drug therapy: Secondary | ICD-10-CM

## 2017-06-05 DIAGNOSIS — E1165 Type 2 diabetes mellitus with hyperglycemia: Secondary | ICD-10-CM | POA: Diagnosis present

## 2017-06-05 DIAGNOSIS — G40901 Epilepsy, unspecified, not intractable, with status epilepticus: Secondary | ICD-10-CM | POA: Diagnosis present

## 2017-06-05 DIAGNOSIS — J9601 Acute respiratory failure with hypoxia: Secondary | ICD-10-CM

## 2017-06-05 DIAGNOSIS — I10 Essential (primary) hypertension: Secondary | ICD-10-CM | POA: Diagnosis present

## 2017-06-05 DIAGNOSIS — J9690 Respiratory failure, unspecified, unspecified whether with hypoxia or hypercapnia: Secondary | ICD-10-CM

## 2017-06-05 DIAGNOSIS — E669 Obesity, unspecified: Secondary | ICD-10-CM | POA: Diagnosis present

## 2017-06-05 DIAGNOSIS — R410 Disorientation, unspecified: Secondary | ICD-10-CM | POA: Diagnosis not present

## 2017-06-05 HISTORY — DX: Unspecified convulsions: R56.9

## 2017-06-05 HISTORY — DX: Type 2 diabetes mellitus without complications: E11.9

## 2017-06-05 LAB — RAPID URINE DRUG SCREEN, HOSP PERFORMED
Amphetamines: NOT DETECTED
Barbiturates: NOT DETECTED
Benzodiazepines: POSITIVE — AB
Cocaine: NOT DETECTED
Opiates: NOT DETECTED
Tetrahydrocannabinol: NOT DETECTED

## 2017-06-05 LAB — PROTIME-INR
INR: 1.14
Prothrombin Time: 14.5 seconds (ref 11.4–15.2)

## 2017-06-05 LAB — GLUCOSE, CAPILLARY
Glucose-Capillary: 433 mg/dL — ABNORMAL HIGH (ref 65–99)
Glucose-Capillary: 341 mg/dL — ABNORMAL HIGH (ref 65–99)
Glucose-Capillary: 315 mg/dL — ABNORMAL HIGH (ref 65–99)
Glucose-Capillary: 255 mg/dL — ABNORMAL HIGH (ref 65–99)
Glucose-Capillary: 218 mg/dL — ABNORMAL HIGH (ref 65–99)
Glucose-Capillary: 224 mg/dL — ABNORMAL HIGH (ref 65–99)
Glucose-Capillary: 177 mg/dL — ABNORMAL HIGH (ref 65–99)
Glucose-Capillary: 167 mg/dL — ABNORMAL HIGH (ref 65–99)

## 2017-06-05 LAB — I-STAT CHEM 8, ED
BUN: 6 mg/dL (ref 6–20)
Calcium, Ion: 1.14 mmol/L — ABNORMAL LOW (ref 1.15–1.40)
Chloride: 104 mmol/L (ref 101–111)
Creatinine, Ser: 0.8 mg/dL (ref 0.44–1.00)
Glucose, Bld: 474 mg/dL — ABNORMAL HIGH (ref 65–99)
HCT: 44 % (ref 36.0–46.0)
Hemoglobin: 15 g/dL (ref 12.0–15.0)
Potassium: 4 mmol/L (ref 3.5–5.1)
Sodium: 138 mmol/L (ref 135–145)
TCO2: 18 mmol/L — ABNORMAL LOW (ref 22–32)

## 2017-06-05 LAB — BASIC METABOLIC PANEL
Anion gap: 16 — ABNORMAL HIGH (ref 5–15)
BUN: 7 mg/dL (ref 6–20)
CO2: 19 mmol/L — ABNORMAL LOW (ref 22–32)
Calcium: 9.3 mg/dL (ref 8.9–10.3)
Chloride: 101 mmol/L (ref 101–111)
Creatinine, Ser: 0.84 mg/dL (ref 0.44–1.00)
GFR calc Af Amer: >60 mL/min (ref >60)
GFR calc non Af Amer: >60 mL/min (ref >60)
Glucose, Bld: 412 mg/dL — ABNORMAL HIGH (ref 65–99)
Potassium: 4.3 mmol/L (ref 3.5–5.1)
Sodium: 136 mmol/L (ref 135–145)

## 2017-06-05 LAB — URINALYSIS, ROUTINE W REFLEX MICROSCOPIC
Bilirubin Urine: NEGATIVE
Glucose, UA: >=500 mg/dL — AB
Ketones, ur: NEGATIVE mg/dL
Leukocytes, UA: NEGATIVE
Nitrite: NEGATIVE
Protein, ur: 100 mg/dL — AB
Specific Gravity, Urine: 1.022 (ref 1.005–1.030)
pH: 5 (ref 5.0–8.0)

## 2017-06-05 LAB — CBC
HCT: 41 % (ref 36.0–46.0)
Hemoglobin: 12.7 g/dL (ref 12.0–15.0)
MCH: 28.7 pg (ref 26.0–34.0)
MCHC: 31 g/dL (ref 30.0–36.0)
MCV: 92.8 fL (ref 78.0–100.0)
Platelets: 341 10*3/uL (ref 150–400)
RBC: 4.42 MIL/uL (ref 3.87–5.11)
RDW: 13.6 % (ref 11.5–15.5)
WBC: 15.1 10*3/uL — ABNORMAL HIGH (ref 4.0–10.5)

## 2017-06-05 LAB — DIFFERENTIAL
Basophils Absolute: 0 10*3/uL (ref 0.0–0.1)
Basophils Relative: 0 %
Eosinophils Absolute: 0.8 10*3/uL — ABNORMAL HIGH (ref 0.0–0.7)
Eosinophils Relative: 5 %
Lymphocytes Relative: 39 %
Lymphs Abs: 5.9 10*3/uL — ABNORMAL HIGH (ref 0.7–4.0)
Monocytes Absolute: 1.4 10*3/uL — ABNORMAL HIGH (ref 0.1–1.0)
Monocytes Relative: 9 %
Neutro Abs: 7 10*3/uL (ref 1.7–7.7)
Neutrophils Relative %: 47 %

## 2017-06-05 LAB — I-STAT ARTERIAL BLOOD GAS, ED
Acid-base deficit: 6 mmol/L — ABNORMAL HIGH (ref 0.0–2.0)
Bicarbonate: 19.6 mmol/L — ABNORMAL LOW (ref 20.0–28.0)
O2 Saturation: 100 %
TCO2: 21 mmol/L — ABNORMAL LOW (ref 22–32)
pCO2 arterial: 39.1 mmHg (ref 32.0–48.0)
pH, Arterial: 7.309 — ABNORMAL LOW (ref 7.350–7.450)
pO2, Arterial: 316 mmHg — ABNORMAL HIGH (ref 83.0–108.0)

## 2017-06-05 LAB — APTT: aPTT: 23 seconds — ABNORMAL LOW (ref 24–36)

## 2017-06-05 LAB — COMPREHENSIVE METABOLIC PANEL
ALT: 30 U/L (ref 14–54)
AST: 48 U/L — ABNORMAL HIGH (ref 15–41)
Albumin: 3.7 g/dL (ref 3.5–5.0)
Alkaline Phosphatase: 109 U/L (ref 38–126)
Anion gap: 24 — ABNORMAL HIGH (ref 5–15)
BUN: 7 mg/dL (ref 6–20)
CO2: 12 mmol/L — ABNORMAL LOW (ref 22–32)
Calcium: 9 mg/dL (ref 8.9–10.3)
Chloride: 101 mmol/L (ref 101–111)
Creatinine, Ser: 1.09 mg/dL — ABNORMAL HIGH (ref 0.44–1.00)
GFR calc Af Amer: 58 mL/min — ABNORMAL LOW (ref >60)
GFR calc non Af Amer: 50 mL/min — ABNORMAL LOW (ref >60)
Glucose, Bld: 500 mg/dL — ABNORMAL HIGH (ref 65–99)
Potassium: 4 mmol/L (ref 3.5–5.1)
Sodium: 137 mmol/L (ref 135–145)
Total Bilirubin: 0.8 mg/dL (ref 0.3–1.2)
Total Protein: 7.7 g/dL (ref 6.5–8.1)

## 2017-06-05 LAB — MRSA PCR SCREENING: MRSA by PCR: NEGATIVE

## 2017-06-05 LAB — MAGNESIUM: Magnesium: 2.3 mg/dL (ref 1.7–2.4)

## 2017-06-05 LAB — I-STAT TROPONIN, ED: Troponin i, poc: 0.07 ng/mL (ref 0.00–0.08)

## 2017-06-05 LAB — PHOSPHORUS: Phosphorus: 2.7 mg/dL (ref 2.5–4.6)

## 2017-06-05 LAB — LACTIC ACID, PLASMA: Lactic Acid, Venous: 3.6 mmol/L (ref 0.5–1.9)

## 2017-06-05 LAB — ETHANOL: Alcohol, Ethyl (B): <10 mg/dL (ref <10)

## 2017-06-05 MED ORDER — MIDAZOLAM HCL 2 MG/2ML IJ SOLN
INTRAMUSCULAR | Status: AC
  Administered 2017-06-05: 2 mg
  Filled 2017-06-05: qty 6

## 2017-06-05 MED ORDER — SODIUM CHLORIDE 0.9 % IV SOLN: 250.0000 mL | INTRAVENOUS | Status: DC | PRN

## 2017-06-05 MED ORDER — SODIUM CHLORIDE 0.9 % IV SOLN
INTRAVENOUS | Status: DC
  Filled 2017-06-05: qty 1

## 2017-06-05 MED ORDER — PROPOFOL 1000 MG/100ML IV EMUL
5.0000 ug/kg/min | Freq: Once | INTRAVENOUS | Status: AC
  Administered 2017-06-05: 5 ug/kg/min via INTRAVENOUS

## 2017-06-05 MED ORDER — INSULIN ASPART 100 UNIT/ML ~~LOC~~ SOLN
1.0000 [IU] | SUBCUTANEOUS | Status: DC
  Administered 2017-06-05: 3 [IU] via SUBCUTANEOUS

## 2017-06-05 MED ORDER — SODIUM CHLORIDE 0.9 % IV SOLN
1500.0000 mg | Freq: Once | INTRAVENOUS | Status: AC
  Administered 2017-06-05: 1500 mg via INTRAVENOUS
  Filled 2017-06-05: qty 15

## 2017-06-05 MED ORDER — SODIUM CHLORIDE 0.9 % IV SOLN
500.0000 mg | Freq: Two times a day (BID) | INTRAVENOUS | Status: DC
  Administered 2017-06-05 – 2017-06-07 (×4): 500 mg via INTRAVENOUS
  Filled 2017-06-05 (×4): qty 5

## 2017-06-05 MED ORDER — SODIUM CHLORIDE 0.45 % IV SOLN
INTRAVENOUS | Status: DC
  Administered 2017-06-05 – 2017-06-08 (×4): via INTRAVENOUS

## 2017-06-05 MED ORDER — CHLORHEXIDINE GLUCONATE 0.12% ORAL RINSE (MEDLINE KIT)
15.0000 mL | Freq: Two times a day (BID) | OROMUCOSAL | Status: DC
Start: 2017-06-05 — End: 2017-06-07
  Administered 2017-06-05 – 2017-06-07 (×4): 15 mL via OROMUCOSAL

## 2017-06-05 MED ORDER — ORAL CARE MOUTH RINSE
15.0000 mL | OROMUCOSAL | Status: DC
Start: 2017-06-05 — End: 2017-06-07
  Administered 2017-06-05 – 2017-06-07 (×15): 15 mL via OROMUCOSAL

## 2017-06-05 MED ORDER — FAMOTIDINE IN NACL 20-0.9 MG/50ML-% IV SOLN
20.0000 mg | Freq: Two times a day (BID) | INTRAVENOUS | Status: DC
  Administered 2017-06-05 – 2017-06-07 (×6): 20 mg via INTRAVENOUS
  Filled 2017-06-05 (×5): qty 50

## 2017-06-05 MED ORDER — PROPOFOL 1000 MG/100ML IV EMUL
5.0000 ug/kg/min | INTRAVENOUS | Status: DC
  Administered 2017-06-05: 20 ug/kg/min via INTRAVENOUS
  Administered 2017-06-05: 30 ug/kg/min via INTRAVENOUS
  Administered 2017-06-06: 20 ug/kg/min via INTRAVENOUS
  Filled 2017-06-05 (×3): qty 100

## 2017-06-05 MED ORDER — SUCCINYLCHOLINE CHLORIDE 20 MG/ML IJ SOLN
INTRAMUSCULAR | Status: AC | PRN
  Administered 2017-06-05: 100 mg via INTRAVENOUS

## 2017-06-05 MED ORDER — INSULIN REGULAR HUMAN 100 UNIT/ML IJ SOLN
INTRAMUSCULAR | Status: DC
  Administered 2017-06-05: 2.8 [IU]/h via INTRAVENOUS
  Administered 2017-06-06: 6.3 [IU]/h via INTRAVENOUS
  Filled 2017-06-05: qty 1

## 2017-06-05 MED ORDER — ETOMIDATE 2 MG/ML IV SOLN
INTRAVENOUS | Status: AC | PRN
  Administered 2017-06-05: 20 mg via INTRAVENOUS

## 2017-06-05 MED ORDER — PROPOFOL 1000 MG/100ML IV EMUL
INTRAVENOUS | Status: AC
  Filled 2017-06-05: qty 100

## 2017-06-05 MED ORDER — HEPARIN SODIUM (PORCINE) 5000 UNIT/ML IJ SOLN
5000.0000 [IU] | Freq: Three times a day (TID) | INTRAMUSCULAR | Status: DC
  Administered 2017-06-05 – 2017-06-09 (×14): 5000 [IU] via SUBCUTANEOUS
  Filled 2017-06-05 (×13): qty 1

## 2017-06-05 NOTE — Progress Notes (Signed)
Received order from Dr Hyacinth MeekerMiller to d/c order for insulin gtt. He does not want to place PICC or central line in pt at this time. Goal is for extubation today. Propofol stopped.

## 2017-06-05 NOTE — Progress Notes (Signed)
Family Medicine Teaching Service PCP Social Visit  [facility] and received notification of her admission today to the ICU for apparent status epilepticus. Stopped by her room to see her. Spoke with her husband, Paula Reed, whom I know well from her prior clinic visits. Offered supportive listening ear.  Appreciate excellent care provided by ICU and neurology teams. FPTS will follow along socially and will be happy to assume care as primary team when patient is ready to come out of the ICU.   Paula FeinsteinBrittany Isao Seltzer, MD [facility]

## 2017-06-05 NOTE — Procedures (Signed)
History: 72 year old female with a history of dementia who presented with new onset seizures  Sedation: She received benzodiazepines prior to EEG  Technique: This is a 21 channel routine scalp EEG performed at the bedside with bipolar and monopolar montages arranged in accordance to the international 10/20 system of electrode placement. One channel was dedicated to EKG recording.    Background: There is irregular delta and theta activity with occasional sleep spindles.  There is no epileptiform discharge.   Photic stimulation: Physiologic driving is not performed  EEG Abnormalities: 1) generalized irregular slow activity   Clinical Interpretation: This EEG is consistent with a generalized nonspecific cerebral dysfunction as can be seen in a sedated state.  There was no seizure or seizure predisposition recorded on this study. Please note that a normal EEG does not preclude the possibility of epilepsy.   Paula SlotMcNeill Ricca Melgarejo, MD [facility] [phone]  If 7pm- 7am, please page neurology on call as listed in AMION.

## 2017-06-05 NOTE — ED Notes (Signed)
Attempted Report x1. 4 [place] Stated they would not approve the bed until 1200.

## 2017-06-05 NOTE — Consult Note (Signed)
Neurology Consultation Reason for Consult: Seizures Referring Physician: Long, J  CC: Seizures  History is obtained from: Referring physician, husband  HPI: Paula Reed is a 72 y.o. female with a history of dementia who presents with new onset seizures that started this morning.  Patient was last known well last night, but has been noticed that she was just laying on her side bearing asleep when he got up and went to the bathroom.  When he came back he found her to be shaking.  EMS arrived and found her to be in status epilepticus and therefore gave him a diazepam she was brought to the emergency department where she still appear to be in status epilepticus and therefore was intubated and started on propofol.   LKW: 2/8 prior to bed   ROS: A 14 point ROS was performed and is negative except as noted in the HPI.    Past Medical History:  Diagnosis Date  . Diabetes mellitus without complication (HCC)    Family history: No history of seizures   Social History: She does not drink alcohol or smoke cigarettes  Exam: Current vital signs: BP 129/72   Pulse 91   Temp (!) 96.9 F (36.1 C) (Temporal)   Resp 19   Ht 5\' 8"  (1.727 m)   Wt 99.8 kg (220 lb)   SpO2 100%   BMI 33.45 kg/m  Vital signs in last 24 hours: Temp:  [96.9 F (36.1 C)] 96.9 F (36.1 C) (02/09 0736) Pulse Rate:  [91-139] 91 (02/09 0930) Resp:  [18-30] 19 (02/09 0930) BP: (111-144)/(72-98) 129/72 (02/09 0930) SpO2:  [99 %-100 %] 100 % (02/09 0930) FiO2 (%):  [100 %] 100 % (02/09 0743) Weight:  [99.8 kg (220 lb)-136.1 kg (300 lb)] 99.8 kg (220 lb) (02/09 0801)   Physical Exam  Constitutional: Appears well-developed and well-nourished.  Psych: Affect appropriate to situation Eyes: No scleral injection HENT: Intubated Head: Normocephalic.  Cardiovascular: Normal rate and regular rhythm.  Respiratory: Ventilated GI: Soft.  No distension. There is no tenderness.  Skin: WDI  Neuro: Mental Status: She  does not open her eyes or follow commands, but she does grimace and move because a purposefully. Cranial Nerves: II: She does not blink to threat pupils are equal, round, and reactive to light.   III,IV, VI: Appears to have a left gaze preference but does return to midline V:VII: Blinks to I would stimulation bilaterally X: Cough present Motor: She moves all extremities positive purposefully, no clonic activity Sensory: She responds to noxious stimulation in all 4 extremities Deep Tendon Reflexes: 1+ and symmetric  patellae. Cerebellar: Not performed  I have reviewed labs in epic and the results pertinent to this consultation are: Elevated blood glucose CMP-normal sodium, creatinine 1.09, leukocytosis at 15  I have reviewed the images obtained: CT head-unremarkable EEG-no ongoing seizure activity  Impression: 72 year old female with a history of dementia who presents with new onset seizures.  She is hyperglycemic, but typically I think of needing higher blood sugars before precipitating seizures.  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  I suspect that they are more likely secondary to her dementia.  Recommendations: 1) Keppra 500 mg twice daily 2) MRI brain 3) magnesium 4) neurology will follow   Ritta SlotMcNeill Kirkpatrick, MD [facility] [phone]  If 7pm- 7am, please page neurology on call as listed in AMION.

## 2017-06-05 NOTE — ED Notes (Signed)
EEG at the bedside  ?

## 2017-06-05 NOTE — H&P (Signed)
PULMONARY / CRITICAL CARE MEDICINE   Name: Paula Reed MRN: [id number] DOB: 07-Oct-1945    ADMISSION DATE:  06/05/2017  CHIEF COMPLAINT:  Status Epilepticus  HISTORY OF PRESENT ILLNESS:   History is obtained from the medical record. Patient is intubated and sedated.  Seen by Dr. Amada Jupiter (Neurology) for seizures. Outside the hospital the patient received benzodiazepines. Additional treatment given in the ER for tonic clonic activity. Patient intubated for airway protection and placed on propofol.  Husband states she has a history of seizures when she was much younger. Currently being managed for DM, dementia. Has a history of polio and he states she did not have residual weakness.  PAST MEDICAL HISTORY :  She  has a past medical history of Diabetes mellitus without complication (HCC).  PAST SURGICAL HISTORY: She  has no past surgical history on file.  No Known Allergies  No current facility-administered medications on file prior to encounter.    Current Outpatient Medications on File Prior to Encounter  Medication Sig  . aspirin EC 81 MG tablet Take 81 mg by mouth daily.  [name] donepezil (ARICEPT) 5 MG tablet Take 5 mg by mouth at bedtime.  . metoprolol tartrate (LOPRESSOR) 25 MG tablet Take 25 mg by mouth 2 (two) times daily.    FAMILY HISTORY:  Her has no family status information on file.    SOCIAL HISTORY: She    REVIEW OF SYSTEMS:   Unobtainable.  SUBJECTIVE:  Unobtainable.  VITAL SIGNS: BP (!) 162/82   Pulse 98   Temp (!) 96.9 F (36.1 C) (Temporal)   Resp (!) 24   Ht 5\' 8"  (1.727 m)   Wt 99.8 kg (220 lb)   SpO2 100%   BMI 33.45 kg/m   HEMODYNAMICS:    VENTILATOR SETTINGS: Vent Mode: PRVC FiO2 (%):  [50 %-100 %] 50 % Set Rate:  [15 bmp] 15 bmp Vt Set:  [450 mL-470 mL] 470 mL PEEP:  [5 cmH20] 5 cmH20 Plateau Pressure:  [18 cmH20] 18 cmH20  INTAKE / OUTPUT: No intake/output data recorded.  PHYSICAL EXAMINATION: General:  Sedated,  intubated. Neuro:  RASS -3 HEENT:  ETT/OGT. PERL. No icterus or JVD. Cardiovascular:  S1s2, regular rhythm. No murmur, gallop or rub. Lungs:  Bilateral breath sounds. Clear to auscultation. Abdomen:  Soft, no elicited tenderness. No organomegaly. Musculoskeletal:  Warm extremities. + pulses. No cyanosis or mottling. Skin:  Dry, no edema.  LABS:  BMET Recent Labs  Lab 06/05/17 0745 06/05/17 0811  NA 137 138  K 4.0 4.0  CL 101 104  CO2 12*  --   BUN 7 6  CREATININE 1.09* 0.80  GLUCOSE 500* 474*    Electrolytes Recent Labs  Lab 06/05/17 0745  CALCIUM 9.0    CBC Recent Labs  Lab 06/05/17 0745 06/05/17 0811  WBC 15.1*  --   HGB 12.7 15.0  HCT 41.0 44.0  PLT 341  --     Coag's Recent Labs  Lab 06/05/17 0745  APTT 23*  INR 1.14    Sepsis Markers No results for input(s): LATICACIDVEN, PROCALCITON, O2SATVEN in the last 168 hours.  ABG Recent Labs  Lab 06/05/17 0856  PHART 7.309*  PCO2ART 39.1  PO2ART 316.0*    Liver Enzymes Recent Labs  Lab 06/05/17 0745  AST 48*  ALT 30  ALKPHOS 109  BILITOT 0.8  ALBUMIN 3.7    Cardiac Enzymes No results for input(s): TROPONINI, PROBNP in the last 168 hours.  Glucose No results for input(s):  GLUCAP in the last 168 hours.  Imaging Ct Head Wo Contrast  Result Date: 06/05/2017 CLINICAL DATA:  Seizure. EXAM: CT HEAD WITHOUT CONTRAST TECHNIQUE: Contiguous axial images were obtained from the base of the skull through the vertex without intravenous contrast. COMPARISON:  None. FINDINGS: Brain: No evidence of acute infarction, hemorrhage, hydrocephalus, extra-axial collection or mass lesion/mass effect. Partially empty sella, incidental. Asymmetric volume loss in the medial left temporal lobe. No historical indication of memory loss to imply semantic dementia. Vascular: No hyperdense vessel. Skull: No acute or aggressive finding. Sinuses/Orbits: Nasopharyngeal fluid in the setting of intubation.Increased retro-orbital  fat with proptosis. No acute finding. IMPRESSION: 1. No acute finding. 2. No definite seizure focus. Asymmetric medial left temporal lobe volume loss. Electronically Signed   By: Marnee Spring M.D.   On: 06/05/2017 08:59   Dg Chest Portable 1 View  Result Date: 06/05/2017 CLINICAL DATA:  Endotracheal tube placement. EXAM: PORTABLE CHEST 1 VIEW COMPARISON:  None. FINDINGS: This is a low volume film. Endotracheal tube is noted at the carina directed towards the right mainstem bronchus. An NG tube is identified entering the stomach with tip off field of view. Mild left basilar atelectasis versus airspace disease noted. There is no evidence of pneumothorax or pleural effusion. IMPRESSION: Endotracheal tube with tip at the carina directed towards right mainstem bronchus - recommend 2-3 cm retraction. Mild left basilar opacity-question atelectasis versus airspace disease. Electronically Signed   By: Harmon Pier M.D.   On: 06/05/2017 08:27   Dg Abd Portable 1 View  Result Date: 06/05/2017 CLINICAL DATA:  OG tube placement. EXAM: PORTABLE ABDOMEN - 1 VIEW COMPARISON:  None. FINDINGS: Appropriately positioned enteric tube, with the tip in the gastric body. Prior cholecystectomy. Paucity of bowel gas. IMPRESSION: Appropriately positioned enteric tube. Electronically Signed   By: Obie Dredge M.D.   On: 06/05/2017 08:31     STUDIES:  Looked at bedside eeg monitoring with Dr. Amada Jupiter. No seizure activity observed.  CULTURES:   ANTIBIOTICS:   SIGNIFICANT EVENTS:   LINES/TUBES:   DISCUSSION: 72 y.o female admitted for status epilepticus, intubated for airway protection.  ASSESSMENT / PLAN:  PULMONARY A: 1. Acute respiratory failure, secondary to status epilepticus. P:   1. ABG's show metabolic acidosis. Likely lactic acidosis secondary to seizures. Check lactate level 2. Dr. Amada Jupiter to provide loading dose of Keppra; once completed will awaken and attempt to extubate. 3. Follow up  cxr after ETT withdrawn cephalad today.  CARDIOVASCULAR A:   P:  1. ECG monitoring.  RENAL A:    P:   Monitor I/O's, renal function. Avoid hypotension.  GASTROINTESTINAL A:    P:   1. NPO 2. Pepcid for gi prophylaxis  HEMATOLOGIC A:    P:  1. Monitor cbc intermittently   ENDOCRINE A:   1. DM   P:   1. Blood glucose with s/s insulin    NEUROLOGIC A:   1. Status epilepticus    P:   1. Discontinue propofol once Keppra infused, assess for extubation.   FAMILY  - Updates: Husband updated at the bedside.  CRITICAL CARE Performed by: Elayne Snare   Total critical care time: 45 minutes  Critical care time was exclusive of separately billable procedures and treating other patients.  Critical care was necessary to treat or prevent imminent or life-threatening deterioration.  Critical care was time spent personally by me on the following activities: development of treatment plan with patient and/or surrogate as well as nursing, discussions  with consultants, evaluation of patient's response to treatment, examination of patient, obtaining history from patient or surrogate, ordering and performing treatments and interventions, ordering and review of laboratory studies, ordering and review of radiographic studies, pulse oximetry and re-evaluation of patient's condition.   Elayne SnareMichael B Radiance Deady, M.D. Pulmonary and Critical Care Medicine [facility] Pager: [phone]  06/05/2017, 10:14 AM

## 2017-06-05 NOTE — Procedures (Signed)
Intubation Procedure Note Paula Reed [id number] 1946-04-20  Procedure: Intubation Indications: Airway protection and maintenance  Procedure Details Consent: Unable to obtain consent because of emergent medical necessity. Time Out: Verified patient identification, verified procedure, site/side was marked, verified correct patient position, special equipment/implants available, medications/allergies/relevent history reviewed, required imaging and test results available.  Performed  Maximum sterile technique was used including cap, gloves, gown, hand hygiene and mask.  MAC and 3    Evaluation Hemodynamic Status: BP stable throughout; O2 sats: stable throughout Patient's Current Condition: stable Complications: No apparent complications Patient did tolerate procedure well. Chest X-ray ordered to verify placement.  CXR: pending.  Pt intubated using Glidescope #3 blade with 7.5 ett secured at 22 at the lip on 1st attempt. Bilateral BS, Positive color change noted on etco2, direct visualization, CXR pending.    Jesse Sans 06/05/2017

## 2017-06-05 NOTE — ED Triage Notes (Signed)
Per [facility], Pt is coming from home actively seizure that was found in her bed at home last seen as 0700. Hx of Diabetes. CBG 343. When EMS arrived, snoring respirations and Left sided gaze.   10 Medizalam IM.   Vitals per EMS: 120 HR, 65 Capnography,

## 2017-06-05 NOTE — ED Notes (Signed)
EEG and Neuro MD at bedside.

## 2017-06-05 NOTE — Progress Notes (Signed)
During bath, a single bed bug was found in sheets.  WCTM

## 2017-06-05 NOTE — ED Provider Notes (Signed)
Emergency Department Provider Note   I have reviewed the triage vital signs and the nursing notes.   HISTORY  Chief Complaint Seizures   HPI Paula Reed is a 72 y.o. female with only known history of DM presents to the emergency department by EMS with intractable seizures.  EMS report that the husband last saw her normal at 7 AM.  There is speaking with each other when the patient suddenly became unresponsive.  EMS was called when they arrived on scene they found her to have deviated gaze and tonic-clonic seizure activity.  They were unable to establish IV access and so gave 10 mg of intramuscular Versed.  Seizures stopped for a brief period of time but then returned shortly before arrival in the emergency department.  The husband reports no known history of seizure.   Level 5 caveat: Status Epilepticus.   The patient's husband has now arrived to the emergency department.  He states that he got up this morning and the patient was "strattling the bed" and only responding with "huh" when he called her name. She seemed to be confused and shaking so he called EMS. No new medications. She is not on meds for DM. She takes Metoprolol and Donepezil only.    Past Medical History:  Diagnosis Date  . Diabetes mellitus without complication (HCC)   . Hypertension   . Seizures [facility](HCC)     Patient Active Problem List   Diagnosis Date Noted  . Status epilepticus (HCC) 06/05/2017    History reviewed. No pertinent surgical history.    Allergies Patient has no known allergies.  No family history on file.  Social History Social History   Tobacco Use  . Smoking status: Former Games developermoker  . Smokeless tobacco: Never Used  Substance Use Topics  . Alcohol use: No    Frequency: Never  . Drug use: No    Review of Systems  Level 5 caveat: Active seizure.   ____________________________________________   PHYSICAL EXAM:  VITAL SIGNS: ED Triage Vitals  Enc Vitals Group     BP  06/05/17 0736 (!) 144/98     Pulse Rate 06/05/17 0736 (!) 139     Resp 06/05/17 0736 (!) 29     Temp --      Temp Source 06/05/17 0736 Temporal     SpO2 06/05/17 0736 100 %     Weight 06/05/17 0732 300 lb (136.1 kg)     Height 06/05/17 0732 5\' 6"  (1.676 m)     Pain Score 06/05/17 0732 0   Constitutional: Patient actively seizing on my initial evaluation. Gaze deviated inferiorly.  Eyes: Gaze deviation downward.  Head: Atraumatic. Nose: No congestion/rhinnorhea. Mouth/Throat: Mucous membranes are moist.   Neck: No stridor.   Cardiovascular: Tachycardia. Good peripheral circulation. Grossly normal heart sounds.   Respiratory: Increased respiratory effort.  No retractions. Lungs CTAB. Gastrointestinal: Soft and nontender. No distention.  Musculoskeletal: No lower extremity tenderness nor edema. No gross deformities of extremities. Neurologic:  Normal speech and language. No gross focal neurologic deficits are appreciated.  Skin:  Skin is warm, dry and intact. Multiple areas of insect bites with bedbugs crawling on the patient's blanket.   ____________________________________________   LABS (all labs ordered are listed, but only abnormal results are displayed)  Labs Reviewed  APTT - Abnormal; Notable for the following components:      Result Value   aPTT 23 (*)    All other components within normal limits  CBC - Abnormal; Notable  for the following components:   WBC 15.1 (*)    All other components within normal limits  DIFFERENTIAL - Abnormal; Notable for the following components:   Lymphs Abs 5.9 (*)    Monocytes Absolute 1.4 (*)    Eosinophils Absolute 0.8 (*)    All other components within normal limits  COMPREHENSIVE METABOLIC PANEL - Abnormal; Notable for the following components:   CO2 12 (*)    Glucose, Bld 500 (*)    Creatinine, Ser 1.09 (*)    AST 48 (*)    GFR calc non Af Amer 50 (*)    GFR calc Af Amer 58 (*)    Anion gap 24 (*)    All other components within  normal limits  RAPID URINE DRUG SCREEN, HOSP PERFORMED - Abnormal; Notable for the following components:   Benzodiazepines POSITIVE (*)    All other components within normal limits  URINALYSIS, ROUTINE W REFLEX MICROSCOPIC - Abnormal; Notable for the following components:   APPearance HAZY (*)    Glucose, UA >=500 (*)    Hgb urine dipstick SMALL (*)    Protein, ur 100 (*)    Bacteria, UA FEW (*)    Squamous Epithelial / LPF 0-5 (*)    All other components within normal limits  LACTIC ACID, PLASMA - Abnormal; Notable for the following components:   Lactic Acid, Venous 3.6 (*)    All other components within normal limits  BASIC METABOLIC PANEL - Abnormal; Notable for the following components:   CO2 19 (*)    Glucose, Bld 412 (*)    Anion gap 16 (*)    All other components within normal limits  GLUCOSE, CAPILLARY - Abnormal; Notable for the following components:   Glucose-Capillary 433 (*)    All other components within normal limits  GLUCOSE, CAPILLARY - Abnormal; Notable for the following components:   Glucose-Capillary 341 (*)    All other components within normal limits  GLUCOSE, CAPILLARY - Abnormal; Notable for the following components:   Glucose-Capillary 315 (*)    All other components within normal limits  GLUCOSE, CAPILLARY - Abnormal; Notable for the following components:   Glucose-Capillary 255 (*)    All other components within normal limits  GLUCOSE, CAPILLARY - Abnormal; Notable for the following components:   Glucose-Capillary 218 (*)    All other components within normal limits  I-STAT CHEM 8, ED - Abnormal; Notable for the following components:   Glucose, Bld 474 (*)    Calcium, Ion 1.14 (*)    TCO2 18 (*)    All other components within normal limits  I-STAT ARTERIAL BLOOD GAS, ED - Abnormal; Notable for the following components:   pH, Arterial 7.309 (*)    pO2, Arterial 316.0 (*)    Bicarbonate 19.6 (*)    TCO2 21 (*)    Acid-base deficit 6.0 (*)    All  other components within normal limits  MRSA PCR SCREENING  ETHANOL  PROTIME-INR  MAGNESIUM  PHOSPHORUS  BASIC METABOLIC PANEL  CBC  LACTIC ACID, PLASMA  I-STAT TROPONIN, ED   ____________________________________________  EKG   EKG Interpretation  Date/Time:  Saturday June 05 2017 07:42:19 EST Ventricular Rate:  138 PR Interval:    QRS Duration: 76 QT Interval:  303 QTC Calculation: 460 R Axis:   5 Text Interpretation:  Sinus tachycardia Abnormal inferior Q waves Lateral infarct, acute Baseline wander in lead(s) II aVR No STEMI.  Confirmed by Alona Bene [phone]) on 06/05/2017 7:52:22 AM  Also confirmed by Alona Bene [phone]), editor Barbette Hair [phone])  on 06/05/2017 9:58:39 AM       ____________________________________________  RADIOLOGY  Ct Head Wo Contrast  Result Date: 06/05/2017 CLINICAL DATA:  Seizure. EXAM: CT HEAD WITHOUT CONTRAST TECHNIQUE: Contiguous axial images were obtained from the base of the skull through the vertex without intravenous contrast. COMPARISON:  None. FINDINGS: Brain: No evidence of acute infarction, hemorrhage, hydrocephalus, extra-axial collection or mass lesion/mass effect. Partially empty sella, incidental. Asymmetric volume loss in the medial left temporal lobe. No historical indication of memory loss to imply semantic dementia. Vascular: No hyperdense vessel. Skull: No acute or aggressive finding. Sinuses/Orbits: Nasopharyngeal fluid in the setting of intubation.Increased retro-orbital fat with proptosis. No acute finding. IMPRESSION: 1. No acute finding. 2. No definite seizure focus. Asymmetric medial left temporal lobe volume loss. Electronically Signed   By: Marnee Spring M.D.   On: 06/05/2017 08:59   Dg Chest Port 1 View  Result Date: 06/05/2017 CLINICAL DATA:  Intubated EXAM: PORTABLE CHEST 1 VIEW COMPARISON:  06/05/2017 FINDINGS: Interval retraction of the endotracheal tube which is now approximately 1.8 cm above the carina. Left base  atelectasis again noted. Mild cardiomegaly. Low lung volumes. No effusions or pneumothorax. IMPRESSION: Interval retraction of endotracheal tube which is now 1.8 cm above the carina. Low lung volumes. Left base atelectasis. Electronically Signed   By: Charlett Nose M.D.   On: 06/05/2017 10:47   Dg Chest Portable 1 View  Result Date: 06/05/2017 CLINICAL DATA:  Endotracheal tube placement. EXAM: PORTABLE CHEST 1 VIEW COMPARISON:  None. FINDINGS: This is a low volume film. Endotracheal tube is noted at the carina directed towards the right mainstem bronchus. An NG tube is identified entering the stomach with tip off field of view. Mild left basilar atelectasis versus airspace disease noted. There is no evidence of pneumothorax or pleural effusion. IMPRESSION: Endotracheal tube with tip at the carina directed towards right mainstem bronchus - recommend 2-3 cm retraction. Mild left basilar opacity-question atelectasis versus airspace disease. Electronically Signed   By: Harmon Pier M.D.   On: 06/05/2017 08:27   Dg Abd Portable 1 View  Result Date: 06/05/2017 CLINICAL DATA:  OG tube placement. EXAM: PORTABLE ABDOMEN - 1 VIEW COMPARISON:  None. FINDINGS: Appropriately positioned enteric tube, with the tip in the gastric body. Prior cholecystectomy. Paucity of bowel gas. IMPRESSION: Appropriately positioned enteric tube. Electronically Signed   By: Obie Dredge M.D.   On: 06/05/2017 08:31    ____________________________________________   PROCEDURES  Procedure(s) performed:   Date/Time: 06/05/2017 7:52 AM Performed by: Maia Plan, MD Induction Type: Rapid sequence Ventilation: Mask ventilation without difficulty Laryngoscope Size: Glidescope and 4 Grade View: Grade I Tube size: 7.5 mm Number of attempts: 1 Placement Confirmation: ETT inserted through vocal cords under direct vision,  CO2 detector and Breath sounds checked- equal and bilateral Secured at: 22 (lip) cm Tube secured with: ETT  holder Dental Injury: Teeth and Oropharynx as per pre-operative assessment     .Critical Care Performed by: Maia Plan, MD Authorized by: Maia Plan, MD   Critical care provider statement:    Critical care time (minutes):  35   Critical care time was exclusive of:  Separately billable procedures and treating other patients and teaching time   Critical care was necessary to treat or prevent imminent or life-threatening deterioration of the following conditions:  Respiratory failure and CNS failure or compromise   Critical care was time spent personally by me  on the following activities:  Blood draw for specimens, development of treatment plan with patient or surrogate, discussions with consultants, evaluation of patient's response to treatment, examination of patient, ordering and performing treatments and interventions, ordering and review of laboratory studies, ordering and review of radiographic studies, pulse oximetry, re-evaluation of patient's condition, review of old charts, ventilator management and obtaining history from patient or surrogate   I assumed direction of critical care for this patient from another provider in my specialty: no       ____________________________________________   INITIAL IMPRESSION / ASSESSMENT AND PLAN / ED COURSE  Pertinent labs & imaging results that were available during my care of the patient were reviewed by me and considered in my medical decision making (see chart for details).  Patient presents to the emergency department by EMS with intractable seizures.  No known history of seizures.  Blood sugar is in the 300s.  Husband not immediately available to provide history on arrival.  The patient does not have IV access.  I secured an intraosseous access in the right tibia and RSI medications were administered.  The patient's airway was secured and propofol was started.  Sending stroke labs and will obtain immediate CT head to evaluate for  hemorrhagic event.  She does afebrile.   09:02 AM Spoke with Dr. Amada Jupiter with Neurology. Agree with starting Keppra in addition to Propofol. Will have respiratory retract ETT 2 cm. Waiting on radiology read of CT but no obvious large volume blood on CT.   Discussed patient's case with ICU to request admission. Patient and family (if present) updated with plan. Care transferred to ICU service.  I reviewed all nursing notes, vitals, pertinent old records, EKGs, labs, imaging (as available).  ____________________________________________  FINAL CLINICAL IMPRESSION(S) / ED DIAGNOSES  Final diagnoses:  Status epilepticus (HCC)     MEDICATIONS GIVEN DURING THIS VISIT:  Medications  0.9 %  sodium chloride infusion (not administered)  heparin injection 5,000 Units (5,000 Units Subcutaneous Given 06/05/17 1356)  famotidine (PEPCID) IVPB 20 mg premix (0 mg Intravenous Stopped 06/05/17 1425)  0.45 % sodium chloride infusion ( Intravenous Rate/Dose Verify 06/05/17 1900)  levETIRAcetam (KEPPRA) 500 mg in sodium chloride 0.9 % 100 mL IVPB (not administered)  propofol (DIPRIVAN) 1000 MG/100ML infusion (20 mcg/kg/min  93.8 kg Intravenous Rate/Dose Change 06/05/17 1845)  insulin regular (NOVOLIN R,HUMULIN R) 100 Units in sodium chloride 0.9 % 100 mL (1 Units/mL) infusion (5.9 Units/hr Intravenous Rate/Dose Verify 06/05/17 1900)  midazolam (VERSED) 2 MG/2ML injection (2 mg  Given 06/05/17 0736)  etomidate (AMIDATE) injection (20 mg Intravenous Given 06/05/17 0740)  succinylcholine (ANECTINE) injection (100 mg Intravenous Given 06/05/17 0741)  propofol (DIPRIVAN) 1000 MG/100ML infusion (0 mcg/kg/min  136.1 kg Intravenous Stopped 06/05/17 1330)  levETIRAcetam (KEPPRA) 1,500 mg in sodium chloride 0.9 % 100 mL IVPB (0 mg Intravenous Stopped 06/05/17 1028)    Note:  This document was prepared using Dragon voice recognition software and may include unintentional dictation errors.  Alona Bene, MD Emergency  Medicine    Narelle Schoening, Arlyss Repress, MD 06/05/17 [phone]

## 2017-06-05 NOTE — Progress Notes (Signed)
Pt transported from ED Trauma C to 4N15 on ventilator. Pt stable throughout with no complications. VS within normal limits.

## 2017-06-05 NOTE — Progress Notes (Signed)
SLP Cancellation Note  Patient Details Name: Paula Reed MRN: [id number] DOB: 1946/01/26   Cancelled treatment:       Reason Eval/Treat Not Completed: Medical issues which prohibited therapy. Pt intubated. Will f/u.  Rondel BatonMary Beth Damari Suastegui, [place]MS, CCC-SLP Speech-Language Pathologist [phone]  Paula Reed 06/05/2017, 12:49 PM

## 2017-06-05 NOTE — Progress Notes (Signed)
Pt transported to and from ED Trauma C to CT on ventilator. Pt stable throughout with no complications. VS within normal limits.

## 2017-06-05 NOTE — ED Notes (Signed)
Xray at the bedside.

## 2017-06-05 NOTE — Progress Notes (Signed)
EEG complete - results pending 

## 2017-06-05 NOTE — Progress Notes (Signed)
Will not extubate today, pt not following commands. Insulin gtt started. MD notified of venous lactic acid level of 3.6.

## 2017-06-06 DIAGNOSIS — J189 Pneumonia, unspecified organism: Secondary | ICD-10-CM

## 2017-06-06 DIAGNOSIS — G40901 Epilepsy, unspecified, not intractable, with status epilepticus: Secondary | ICD-10-CM

## 2017-06-06 DIAGNOSIS — G9340 Encephalopathy, unspecified: Secondary | ICD-10-CM

## 2017-06-06 DIAGNOSIS — J9601 Acute respiratory failure with hypoxia: Secondary | ICD-10-CM

## 2017-06-06 LAB — GLUCOSE, CAPILLARY
Glucose-Capillary: 100 mg/dL — ABNORMAL HIGH (ref 65–99)
Glucose-Capillary: 120 mg/dL — ABNORMAL HIGH (ref 65–99)
Glucose-Capillary: 121 mg/dL — ABNORMAL HIGH (ref 65–99)
Glucose-Capillary: 134 mg/dL — ABNORMAL HIGH (ref 65–99)
Glucose-Capillary: 137 mg/dL — ABNORMAL HIGH (ref 65–99)
Glucose-Capillary: 140 mg/dL — ABNORMAL HIGH (ref 65–99)
Glucose-Capillary: 143 mg/dL — ABNORMAL HIGH (ref 65–99)
Glucose-Capillary: 145 mg/dL — ABNORMAL HIGH (ref 65–99)
Glucose-Capillary: 145 mg/dL — ABNORMAL HIGH (ref 65–99)
Glucose-Capillary: 148 mg/dL — ABNORMAL HIGH (ref 65–99)
Glucose-Capillary: 164 mg/dL — ABNORMAL HIGH (ref 65–99)
Glucose-Capillary: 167 mg/dL — ABNORMAL HIGH (ref 65–99)
Glucose-Capillary: 173 mg/dL — ABNORMAL HIGH (ref 65–99)
Glucose-Capillary: 174 mg/dL — ABNORMAL HIGH (ref 65–99)
Glucose-Capillary: 175 mg/dL — ABNORMAL HIGH (ref 65–99)
Glucose-Capillary: 178 mg/dL — ABNORMAL HIGH (ref 65–99)
Glucose-Capillary: 181 mg/dL — ABNORMAL HIGH (ref 65–99)
Glucose-Capillary: 185 mg/dL — ABNORMAL HIGH (ref 65–99)
Glucose-Capillary: 205 mg/dL — ABNORMAL HIGH (ref 65–99)
Glucose-Capillary: 93 mg/dL (ref 65–99)

## 2017-06-06 LAB — BASIC METABOLIC PANEL
Anion gap: 14 (ref 5–15)
BUN: 7 mg/dL (ref 6–20)
CO2: 20 mmol/L — ABNORMAL LOW (ref 22–32)
Calcium: 9.2 mg/dL (ref 8.9–10.3)
Chloride: 104 mmol/L (ref 101–111)
Creatinine, Ser: 0.78 mg/dL (ref 0.44–1.00)
GFR calc Af Amer: >60 mL/min (ref >60)
GFR calc non Af Amer: >60 mL/min (ref >60)
Glucose, Bld: 133 mg/dL — ABNORMAL HIGH (ref 65–99)
Potassium: 3.3 mmol/L — ABNORMAL LOW (ref 3.5–5.1)
Sodium: 138 mmol/L (ref 135–145)

## 2017-06-06 LAB — CBC
HCT: 40.1 % (ref 36.0–46.0)
Hemoglobin: 12.8 g/dL (ref 12.0–15.0)
MCH: 28.6 pg (ref 26.0–34.0)
MCHC: 31.9 g/dL (ref 30.0–36.0)
MCV: 89.7 fL (ref 78.0–100.0)
Platelets: 254 10*3/uL (ref 150–400)
RBC: 4.47 MIL/uL (ref 3.87–5.11)
RDW: 14.1 % (ref 11.5–15.5)
WBC: 11.7 10*3/uL — ABNORMAL HIGH (ref 4.0–10.5)

## 2017-06-06 LAB — PROCALCITONIN: Procalcitonin: 0.47 ng/mL

## 2017-06-06 LAB — MAGNESIUM: Magnesium: 2.1 mg/dL (ref 1.7–2.4)

## 2017-06-06 LAB — PHOSPHORUS: Phosphorus: 2.8 mg/dL (ref 2.5–4.6)

## 2017-06-06 LAB — LACTIC ACID, PLASMA: Lactic Acid, Venous: 1.7 mmol/L (ref 0.5–1.9)

## 2017-06-06 MED ORDER — VITAL HIGH PROTEIN PO LIQD: 1000.0000 mL | ORAL | Status: DC

## 2017-06-06 MED ORDER — INSULIN GLARGINE 100 UNIT/ML ~~LOC~~ SOLN
10.0000 [IU] | SUBCUTANEOUS | Status: DC
  Administered 2017-06-06: 10 [IU] via SUBCUTANEOUS
  Filled 2017-06-06 (×2): qty 0.1

## 2017-06-06 MED ORDER — INSULIN ASPART 100 UNIT/ML ~~LOC~~ SOLN
2.0000 [IU] | SUBCUTANEOUS | Status: DC
  Administered 2017-06-06: 4 [IU] via SUBCUTANEOUS
  Administered 2017-06-07: 6 [IU] via SUBCUTANEOUS
  Administered 2017-06-07: 2 [IU] via SUBCUTANEOUS
  Administered 2017-06-07: 6 [IU] via SUBCUTANEOUS

## 2017-06-06 MED ORDER — DEXTROSE 10 % IV SOLN
INTRAVENOUS | Status: DC | PRN
  Administered 2017-06-07: via INTRAVENOUS

## 2017-06-06 MED ORDER — PIPERACILLIN-TAZOBACTAM 3.375 G IVPB
3.3750 g | Freq: Three times a day (TID) | INTRAVENOUS | Status: DC
  Administered 2017-06-07: 3.375 g via INTRAVENOUS
  Filled 2017-06-06 (×2): qty 50

## 2017-06-06 MED ORDER — PIPERACILLIN-TAZOBACTAM 3.375 G IVPB 30 MIN
3.3750 g | Freq: Once | INTRAVENOUS | Status: AC
  Administered 2017-06-06: 3.375 g via INTRAVENOUS
  Filled 2017-06-06: qty 50

## 2017-06-06 MED ORDER — IBUPROFEN 100 MG/5ML PO SUSP: 400.0000 mg | Freq: Four times a day (QID) | ORAL | Status: DC | PRN

## 2017-06-06 MED ORDER — PRO-STAT SUGAR FREE PO LIQD: 30.0000 mL | Freq: Two times a day (BID) | ORAL | Status: DC

## 2017-06-06 MED ORDER — POTASSIUM CHLORIDE 20 MEQ/15ML (10%) PO SOLN
40.0000 meq | Freq: Once | ORAL | Status: AC
  Administered 2017-06-06: 40 meq
  Filled 2017-06-06: qty 30

## 2017-06-06 NOTE — Progress Notes (Signed)
Family Medicine Teaching Service PCP Social Visit  Visited patient, her husband, and son at bedside today. Patient extubated today. Followed commands for me, moves all 4 extremities purposefully. Able to tell me her name though voice hoarse from ET tube.   Note that to my knowledge patient has no history of seizure disorder, and workup including MRI would be prudent. MRI not done yet.   Appreciate ICU team's care. [facility]PTS happy to accept patient if/when she is transferred out of ICU.  Levert FeinsteinBrittany Kambree Krauss, MD [facility]

## 2017-06-06 NOTE — Progress Notes (Signed)
Subjective: Extubated earlier today, he is doing well.  Exam: Vitals:   06/06/17 1939 06/06/17 2000  BP:  130/88  Pulse:  (!) 119  Resp:  18  Temp: (!) 101.2 F (38.4 C)   SpO2:  96%   Gen: In bed, NAD Resp: non-labored breathing, no acute distress Abd: soft, nt  Neuro: MS: Awake, does not identify her son correctly, not oriented CN: Visual fields full, extraocular movements intact, face symmetric Motor: She moves all extremities to command with relatively symmetric strength  sensory: She endorses symmetric sensation  Pertinent Labs: Pro calcitonin negative  Impression: 72 year old female with new onset seizures in the setting of dementia.  I suspect this is secondary to her dementia, however an MRI would be reasonable.  Recommendations: 1) MRI brain 2) continue Keppra 500 mg twice daily  Ritta SlotMcNeill Kirkpatrick, MD [facility] [phone]  If 7pm- 7am, please page neurology on call as listed in AMION.

## 2017-06-06 NOTE — Progress Notes (Signed)
Brief Nutrition Note  Consult received for enteral/tube feeding initiation and management.  Adult Enteral Nutrition Protocol initiated. Full assessment to follow.  Admitting Dx: Respiratory failure (HCC) [J96.90] Status epilepticus (HCC) [G40.901]  Body mass index is 32.28 kg/m. Pt meets criteria for obesity based on current BMI.  Labs:  Recent Labs  Lab 06/05/17 0745 06/05/17 0811 06/05/17 1443 06/06/17 0639  NA 137 138 136 138  K 4.0 4.0 4.3 3.3*  CL 101 104 101 104  CO2 12*  --  19* 20*  BUN 7 6 7 7   CREATININE 1.09* 0.80 0.84 0.78  CALCIUM 9.0  --  9.3 9.2  MG  --   --  2.3  --   PHOS  --   --  2.7  --   GLUCOSE 500* 474* 412* 133*    Tilda FrancoLindsey Ashby Moskal, MS, RD, LDN [facility] Clinical Dietitian Pager: [phone] After Hours Pager: [phone]

## 2017-06-06 NOTE — H&P (Signed)
PULMONARY / CRITICAL CARE MEDICINE   Name: Paula Reed MRN: [id number] DOB: [birth year]    ADMISSION DATE:  06/05/2017  CHIEF COMPLAINT:  Status Epilepticus  HISTORY OF PRESENT ILLNESS:   History is obtained from the medical record. Patient is intubated and sedated.  Seen by Dr. Amada Jupiter (Neurology) for seizures. Outside the hospital the patient received benzodiazepines. Additional treatment given in the ER for tonic clonic activity. Patient intubated for airway protection and placed on propofol.  Husband states she has a history of seizures when she was much younger. Currently being managed for DM, dementia. Has a history of polio and he states she did not have residual weakness.  SUBJECTIVE:  Remains intubated and sedated at this time.  VITAL SIGNS: BP (!) 154/92   Pulse (!) 110   Temp 100.2 F (37.9 C) (Bladder)   Resp (!) 23   Ht 5\' 8"  (1.727 m)   Wt 96.3 kg (212 lb 4.9 oz)   SpO2 100%   BMI 32.28 kg/m   HEMODYNAMICS:    VENTILATOR SETTINGS: Vent Mode: CPAP;PSV FiO2 (%):  [40 %] 40 % Set Rate:  [15 bmp] 15 bmp Vt Set:  [470 mL] 470 mL PEEP:  [5 cmH20] 5 cmH20 Pressure Support:  [10 cmH20] 10 cmH20 Plateau Pressure:  [15 cmH20-18 cmH20] 17 cmH20  INTAKE / OUTPUT: I/O last 3 completed shifts: In: 1206.6 [I.V.:1206.6] Out: 775 [Urine:775]  PHYSICAL EXAMINATION: General: Obese female sedated with the proband on vent HEENT: Orotracheal tube connected to ventilator, NG tube to suction PSY: Not available Neuro: Sedated with the proband not following commands not moving.  RN reports she was agitated thrashing on previous wakeup assessment. CV: Heart sounds are regular PULM: Decreased breath sounds in bases NW:GNFA, non-tender, bsx4 active  Extremities: warm/dry, 1+ edema  Skin: no rashes or lesions   LABS:  BMET Recent Labs  Lab 06/05/17 0745 06/05/17 0811 06/05/17 1443 06/06/17 0639  NA 137 138 136 138  K 4.0 4.0 4.3 3.3*  CL 101 104 101 104   CO2 12*  --  19* 20*  BUN 7 6 7 7   CREATININE 1.09* 0.80 0.84 0.78  GLUCOSE 500* 474* 412* 133*    Electrolytes Recent Labs  Lab 06/05/17 0745 06/05/17 1443 06/06/17 0639  CALCIUM 9.0 9.3 9.2  MG  --  2.3  --   PHOS  --  2.7  --     CBC Recent Labs  Lab 06/05/17 0745 06/05/17 0811 06/06/17 0639  WBC 15.1*  --  11.7*  HGB 12.7 15.0 12.8  HCT 41.0 44.0 40.1  PLT 341  --  254    Coag's Recent Labs  Lab 06/05/17 0745  APTT 23*  INR 1.14    Sepsis Markers Recent Labs  Lab 06/05/17 1443 06/06/17 0639  LATICACIDVEN 3.6* 1.7    ABG Recent Labs  Lab 06/05/17 0856  PHART 7.309*  PCO2ART 39.1  PO2ART 316.0*    Liver Enzymes Recent Labs  Lab 06/05/17 0745  AST 48*  ALT 30  ALKPHOS 109  BILITOT 0.8  ALBUMIN 3.7    Cardiac Enzymes No results for input(s): TROPONINI, PROBNP in the last 168 hours.  Glucose Recent Labs  Lab 06/06/17 0446 06/06/17 0541 06/06/17 0653 06/06/17 0753 06/06/17 0855 06/06/17 1004  GLUCAP 181* 145* 137* 175* 185* 140*    Imaging Dg Chest Port 1 View  Result Date: 06/05/2017 CLINICAL DATA:  Intubated EXAM: PORTABLE CHEST 1 VIEW COMPARISON:  06/05/2017 FINDINGS: Interval retraction of  the endotracheal tube which is now approximately 1.8 cm above the carina. Left base atelectasis again noted. Mild cardiomegaly. Low lung volumes. No effusions or pneumothorax. IMPRESSION: Interval retraction of endotracheal tube which is now 1.8 cm above the carina. Low lung volumes. Left base atelectasis. Electronically Signed   By: Charlett NoseKevin  Dover M.D.   On: 06/05/2017 10:47     STUDIES:  EEG without overt seizure activity  CULTURES: Negative MRSA screen 06/05/2017  ANTIBIOTICS: None  SIGNIFICANT EVENTS: 06/05/2017 intubated procedures  LINES/TUBES: 06/05/2017 endotracheal tube procedure>>  DISCUSSION: 72 y.o female admitted for status epilepticus, intubated for airway protection.  ASSESSMENT / PLAN:  PULMONARY A: 1. Acute  respiratory failure, secondary to status epilepticus. P:   1. ABG's show metabolic acidosis.  Note her lactic acid went from 3.6 on 06/05/2017 to 1.7 on 06/06/2017  2. Dr. Amada JupiterKirkpatrick to provide loading dose of Keppra; once completed will awaken and attempt to extubate.  Note following Keppra she was not following commands we will attempt to wean her propofol off and evaluate for extubation 3.  Serial chest x-rays as needed  CARDIOVASCULAR A:  Hemodynamically stable P:  Hemodynamic monitoring  RENAL Lab Results  Component Value Date   CREATININE 0.78 06/06/2017   CREATININE 0.84 06/05/2017   CREATININE 0.80 06/05/2017   Recent Labs  Lab 06/05/17 0811 06/05/17 1443 06/06/17 0639  K 4.0 4.3 3.3*    A:   Hypokalemia P:   Replete electrolytes as needed  GASTROINTESTINAL A:    P:   1. NPO 2. Pepcid for gi prophylaxis, if not extubated 06/06/2017 will start on tube feedings.  HEMATOLOGIC Recent Labs    06/05/17 0811 06/06/17 0639  HGB 15.0 12.8    A:   No acute issues P:  1. Monitor cbc intermittently, heparin for DVT prophylaxis   ENDOCRINE CBG (last 3)  Recent Labs    06/06/17 0753 06/06/17 0855 06/06/17 1004  GLUCAP 175* 185* 140*    A:   1. DM   P:   1. Blood glucose with s/s insulin 2.  Watch glucose closely once tube feedings are started.    NEUROLOGIC A:   1. Status epilepticus    P:   1. Discontinue propofol once Keppra infused, assess for extubation. 06/06/2017 when the proband.  Became agitated but did not follow commands.  Reevaluate off the proband on 06/06/2017  FAMILY  - Updates: 06/06/2017 no family at the bedside  App CCT 40 min   Brett CanalesSteve Minor ACNP [facility] P[facility] Pager [phone] till 1 pm If no answer page [phone] 06/06/2017, 10:43 AM  Attending Note:  72 year old female with history of seizure disorder who presents to the hospital post ictal.  Patient was intubated for airway protection.  On exam, she is unresponsive,  sedated on propofol after infusion of keppra with coarse BS diffusely.  I reviewed CXR myself, concern for LLL infiltrate.  ETT is in good place.  Will begin weaning trials after propofol is off today.  Anti-epileptics per cards.  Passed weaning this AM from a mechanics standpoint but not from a neuro standpoint, will continue PS trials but no extubation til more awake.  Will check PCT, if positive then will need abx.  The patient is critically ill with multiple organ systems failure and requires high complexity decision making for assessment and support, frequent evaluation and titration of therapies, application of advanced monitoring technologies and extensive interpretation of multiple databases.   Critical Care Time devoted to  patient care services described in this note is  35  Minutes. This time reflects time of care of this signee Dr Jennet Maduro. This critical care time does not reflect procedure time, or teaching time or supervisory time of PA/NP/Med student/Med Resident etc but could involve care discussion time.  Rush Farmer, M.D. [facility] Pulmonary/Critical Care Medicine. Pager: [phone]. After hours pager: [phone].

## 2017-06-06 NOTE — Progress Notes (Signed)
eLink Physician-Brief Progress Note Patient Name: Paula CollaBetty L Reed DOB: 07/15/1945 MRN: [id number]   Date of Service  06/06/2017  HPI/Events of Note  Fever to 101.2 F - Patient admitted with status epilepticus. At high risk for aspiration. AST elevated, therefore, can't use Tylenol. Creatinine = 0.78.   eICU Interventions  Will order: 1. Blood cultures X 2 now.  2. Tracheal aspirate culture now.  3. Zosyn per pharmacy consult.  4. Motrin 400 mg per tube Q 6 hour PRN Temp > 100.5 F.     Intervention Category Major Interventions: Infection - evaluation and management  Sommer,Steven Eugene 06/06/2017, 9:33 PM

## 2017-06-06 NOTE — Progress Notes (Signed)
SLP Cancellation Note  Patient Details Name: Phebe CollaBetty L Brawn MRN: [id number] DOB: 02-17-1946   Cancelled treatment:       Reason Eval/Treat Not Completed: Medical issues which prohibited therapy. Per charting, pt remains intubated. Will f/u next date.  Rondel BatonMary Beth Jadier Rockers, [place]MS, CCC-SLP Speech-Language Pathologist [phone]   Arlana LindauMary E Random Dobrowski 06/06/2017, 10:20 AM

## 2017-06-06 NOTE — Procedures (Signed)
Extubation Procedure Note  Patient Details:   Name: Phebe CollaBetty L Parsley DOB: 07-Jan-1946 MRN: [id number]   Airway Documentation:     Evaluation  O2 sats: stable throughout Complications: No apparent complications Patient did tolerate procedure well. Bilateral Breath Sounds: Diminished   Yes   Patient extubated to 4lnc. Vitals stable at this time. No complications. RN anf family at bedside. RT will continue to monitor.  Ave Filterdkins, Trinitey Roache Williams 06/06/2017, 12:29 PM

## 2017-06-06 NOTE — Progress Notes (Signed)
Pt has a sudden increase in temp. Paged elink for a tylenol PRN, awaiting order

## 2017-06-06 NOTE — Progress Notes (Signed)
Noted rhythm change at 1200.  Pt does not appear to be in any distress.  Dr. Hyacinth MeekerMiller notified and ordered a STAT EKG.

## 2017-06-06 NOTE — Progress Notes (Signed)
Pharmacy Antibiotic Note Phebe CollaBetty L Lemberger is a 72 y.o. female admitted on 06/05/2017 with seizures. Pharmacy has been consulted for Zosyn dosing due to concern for aspiration pneumonia.  Plan: Zosyn 3.375g IV q8h (4 hour infusion).  Height: 5\' 8"  (172.7 cm) Weight: 212 lb 4.9 oz (96.3 kg) IBW/kg (Calculated) : 63.9  Temp (24hrs), Avg:100.4 F (38 C), Min:99.3 F (37.4 C), Max:101.2 F (38.4 C)  Recent Labs  Lab 06/05/17 0745 06/05/17 0811 06/05/17 1443 06/06/17 0639  WBC 15.1*  --   --  11.7*  CREATININE 1.09* 0.80 0.84 0.78  LATICACIDVEN  --   --  3.6* 1.7     No Known Allergies  Thank you for allowing pharmacy to be a part of this patient's care.  Sheron NightingaleJames A Indica Marcott 06/06/2017 9:34 PM

## 2017-06-07 ENCOUNTER — Other Ambulatory Visit: Payer: Self-pay

## 2017-06-07 ENCOUNTER — Inpatient Hospital Stay (HOSPITAL_COMMUNITY): Payer: Medicare Other

## 2017-06-07 LAB — GLUCOSE, CAPILLARY
Glucose-Capillary: 131 mg/dL — ABNORMAL HIGH (ref 65–99)
Glucose-Capillary: 141 mg/dL — ABNORMAL HIGH (ref 65–99)
Glucose-Capillary: 164 mg/dL — ABNORMAL HIGH (ref 65–99)
Glucose-Capillary: 204 mg/dL — ABNORMAL HIGH (ref 65–99)
Glucose-Capillary: 223 mg/dL — ABNORMAL HIGH (ref 65–99)
Glucose-Capillary: 238 mg/dL — ABNORMAL HIGH (ref 65–99)

## 2017-06-07 LAB — CBC WITH DIFFERENTIAL/PLATELET
Basophils Absolute: 0 10*3/uL (ref 0.0–0.1)
Basophils Relative: 0 %
Eosinophils Absolute: 0.3 10*3/uL (ref 0.0–0.7)
Eosinophils Relative: 3 %
HCT: 36.8 % (ref 36.0–46.0)
Hemoglobin: 11.5 g/dL — ABNORMAL LOW (ref 12.0–15.0)
Lymphocytes Relative: 22 %
Lymphs Abs: 2.1 10*3/uL (ref 0.7–4.0)
MCH: 28 pg (ref 26.0–34.0)
MCHC: 31.3 g/dL (ref 30.0–36.0)
MCV: 89.8 fL (ref 78.0–100.0)
Monocytes Absolute: 0.9 10*3/uL (ref 0.1–1.0)
Monocytes Relative: 9 %
Neutro Abs: 6.1 10*3/uL (ref 1.7–7.7)
Neutrophils Relative %: 66 %
Platelets: 235 10*3/uL (ref 150–400)
RBC: 4.1 MIL/uL (ref 3.87–5.11)
RDW: 13.9 % (ref 11.5–15.5)
WBC: 9.3 10*3/uL (ref 4.0–10.5)

## 2017-06-07 LAB — PHOSPHORUS
Phosphorus: 1.8 mg/dL — ABNORMAL LOW (ref 2.5–4.6)
Phosphorus: 2.2 mg/dL — ABNORMAL LOW (ref 2.5–4.6)

## 2017-06-07 LAB — BLOOD GAS, ARTERIAL
Acid-base deficit: 1.5 mmol/L (ref 0.0–2.0)
Bicarbonate: 21.9 mmol/L (ref 20.0–28.0)
Drawn by: 249101
O2 Content: 21 L/min
O2 Saturation: 94 %
Patient temperature: 98.6
pCO2 arterial: 31.8 mmHg — ABNORMAL LOW (ref 32.0–48.0)
pH, Arterial: 7.453 — ABNORMAL HIGH (ref 7.350–7.450)
pO2, Arterial: 70.9 mmHg — ABNORMAL LOW (ref 83.0–108.0)

## 2017-06-07 LAB — PROCALCITONIN: Procalcitonin: 0.33 ng/mL

## 2017-06-07 LAB — BASIC METABOLIC PANEL
Anion gap: 10 (ref 5–15)
BUN: 5 mg/dL — ABNORMAL LOW (ref 6–20)
CO2: 22 mmol/L (ref 22–32)
Calcium: 8.4 mg/dL — ABNORMAL LOW (ref 8.9–10.3)
Chloride: 106 mmol/L (ref 101–111)
Creatinine, Ser: 0.77 mg/dL (ref 0.44–1.00)
GFR calc Af Amer: >60 mL/min (ref >60)
GFR calc non Af Amer: >60 mL/min (ref >60)
Glucose, Bld: 192 mg/dL — ABNORMAL HIGH (ref 65–99)
Potassium: 3 mmol/L — ABNORMAL LOW (ref 3.5–5.1)
Sodium: 138 mmol/L (ref 135–145)

## 2017-06-07 LAB — HEMOGLOBIN A1C
Hgb A1c MFr Bld: 10 % — ABNORMAL HIGH (ref 4.8–5.6)
Mean Plasma Glucose: 240.3 mg/dL

## 2017-06-07 LAB — MAGNESIUM
Magnesium: 1.8 mg/dL (ref 1.7–2.4)
Magnesium: 1.8 mg/dL (ref 1.7–2.4)

## 2017-06-07 MED ORDER — SODIUM CHLORIDE 0.9 % IV SOLN
500.0000 mg | Freq: Two times a day (BID) | INTRAVENOUS | Status: DC
  Administered 2017-06-07 – 2017-06-08 (×2): 500 mg via INTRAVENOUS
  Filled 2017-06-07 (×3): qty 5

## 2017-06-07 MED ORDER — INSULIN GLARGINE 100 UNIT/ML ~~LOC~~ SOLN
15.0000 [IU] | Freq: Every day | SUBCUTANEOUS | Status: DC
  Administered 2017-06-07 – 2017-06-08 (×2): 15 [IU] via SUBCUTANEOUS
  Filled 2017-06-07 (×5): qty 0.15

## 2017-06-07 MED ORDER — INSULIN ASPART 100 UNIT/ML ~~LOC~~ SOLN: 0.0000 [IU] | Freq: Every day | SUBCUTANEOUS | Status: DC

## 2017-06-07 MED ORDER — INSULIN ASPART 100 UNIT/ML ~~LOC~~ SOLN
0.0000 [IU] | Freq: Three times a day (TID) | SUBCUTANEOUS | Status: DC
  Administered 2017-06-07: 3 [IU] via SUBCUTANEOUS
  Administered 2017-06-07: 5 [IU] via SUBCUTANEOUS
  Administered 2017-06-08: 3 [IU] via SUBCUTANEOUS
  Administered 2017-06-08: 5 [IU] via SUBCUTANEOUS
  Administered 2017-06-08: 3 [IU] via SUBCUTANEOUS
  Administered 2017-06-09: 5 [IU] via SUBCUTANEOUS
  Administered 2017-06-09: 2 [IU] via SUBCUTANEOUS

## 2017-06-07 NOTE — Progress Notes (Addendum)
Inpatient Diabetes Program Recommendations  AACE/ADA: New Consensus Statement on Inpatient Glycemic Control (2015)  Target Ranges:  Prepandial:   less than 140 mg/dL      Peak postprandial:   less than 180 mg/dL (1-2 hours)      Critically ill patients:  140 - 180 mg/dL   Lab Results  Component Value Date   GLUCAP 164 (H) 06/07/2017    Review of Glycemic ControlResults for Paula Reed, Kelcie Reed (MRN [id number]) as of 06/07/2017 11:40  Ref. Range 06/05/2017 07:45 06/05/2017 08:11 06/05/2017 14:43 06/06/2017 06:39 06/07/2017 09:05  Glucose Latest Ref Range: 65 - 99 mg/dL 409500 (H) 811474 (H) 914412 (H) 133 (H) 192 (H)    Diabetes history: Type 2 DM Outpatient Diabetes medications: None Current orders for Inpatient glycemic control:  Lantus/Novolog d/c'd  Inpatient Diabetes Program Recommendations:    Called RN and she states that patient is progressing and to transfer to floor today.  Subcutaneous orders removed, however unsure why? Paged MD to discuss. Orders received to resume Lantus 15 units daily, Novolog moderate tid with meals and HS, and to check A1C.   Thanks,  Beryl MeagerJenny Kyoko Elsea, RN, BC-ADM Inpatient Diabetes Coordinator Pager [phone] (8a-5p)  15:00- Addendum: Results for Paula Reed, Paula Reed (MRN [id number]) as of 06/07/2017 15:50  Ref. Range 06/07/2017 11:57  Hemoglobin A1C Latest Ref Range: 4.8 - 5.6 % 10.0 (H)    Spoke with patient regarding elevated A1C.  She states that she is not on any medications for DM. Husband states that she had been in the past, however it was stopped.  Discussed that A1C was 10% and that she appears to need medications for DM.  She plans to follow-up with primary MD, Dr. Pollie MeyerMcIntyre about DM.  Patients husband was speaking for her.  It was difficult to assess patient's understanding in this interaction.  Will follow.

## 2017-06-07 NOTE — Progress Notes (Signed)
Brief Hospital Course  Paula Reed is a 72yo F with PMH significant for Type 2 Diabetes, HLD, dementia, HTN, polio with no residual weakness who presented in status epilepticus on 2/9 and was intubated for airway protection. She received a loading dose of Keppra in the ED and provided maintenance dosing afterwards. Prior to arrival, was given a dose of benzodiazepine per EMS. Patient apparently had seizures as a child per her husband.   Patient has been managed in the ICU and extubated on 2/10 without complication. She did however spike a fever to 101.4F 2/10.  Blood and tracheal aspirate cultures drawn and started on Zosyn.  FPTS will resume care on 2/12 once transferred to the floor.  Ellwood DenseAlison Ivor Kishi, DO PGY-1, [facility] 06/07/2017 12:31 PM

## 2017-06-07 NOTE — Progress Notes (Signed)
PULMONARY / CRITICAL CARE MEDICINE   Name: Paula Reed MRN: [id number] DOB: 1945-08-23    ADMISSION DATE:  06/05/2017  HISTORY OF PRESENT ILLNESS:        Seen by Dr. Amada JupiterKirkpatrick (Neurology) for seizures. Outside the hospital the patient received benzodiazepines. Additional treatment given in the ER for tonic clonic activity. Patient intubated for airway protection and placed on propofol 02/09.  She has been extubated and is following verbal instructions.  She denies having been ill prior to her seizure.  She has had no further seizure activity in the hospital.  She is denying cough or shortness of breath.         Husband states she has a history of seizures when she was much younger. Currently being managed for DM, dementia. Has a history of polio and he states she did not have residual weakness.     PAST MEDICAL HISTORY :  She  has a past medical history of Diabetes mellitus without complication (HCC), Hypertension, and Seizures (HCC).  PAST SURGICAL HISTORY: She  has no past surgical history on file.  No Known Allergies  No current facility-administered medications on file prior to encounter.    Current Outpatient Medications on File Prior to Encounter  Medication Sig  . aspirin EC 81 MG tablet Take 81 mg by mouth daily.  [name]. donepezil (ARICEPT) 5 MG tablet Take 5 mg by mouth at bedtime.  . metoprolol tartrate (LOPRESSOR) 25 MG tablet Take 25 mg by mouth 2 (two) times daily.    FAMILY HISTORY:  Her has no family status information on file.    SOCIAL HISTORY: She  reports that she has quit smoking. she has never used smokeless tobacco. She reports that she does not drink alcohol or use drugs.  REVIEW OF SYSTEMS:   Not obtainable  SUBJECTIVE:  As above  VITAL SIGNS: BP 132/83   Pulse 96   Temp 99.9 F (37.7 C) (Core)   Resp (!) 25   Ht 5\' 8"  (1.727 m)   Wt 212 lb 4.9 oz (96.3 kg)   SpO2 95%   BMI 32.28 kg/m   HEMODYNAMICS:    VENTILATOR SETTINGS: Vent  Mode: PSV;CPAP FiO2 (%):  [40 %] 40 % PEEP:  [5 cmH20] 5 cmH20 Pressure Support:  [5 cmH20-10 cmH20] 5 cmH20  INTAKE / OUTPUT: I/O last 3 completed shifts: In: 2591.5 [I.V.:2231.5; IV Piggyback:360] Out: 1140 [Urine:1140]  PHYSICAL EXAMINATION: General: Elderly female who is pleasantly interactive and in no distress. Neuro: She is only oriented x1.  She moves all fours on request.  The pupils are equal and reactive, there is a bilateral arcus.  The face is symmetric. Cardiovascular: S1 and S2 are regular without murmur rub or gallop. Lungs: Respirations are unlabored, there is symmetric air movement, there are no wheezes, rhonchi, or rales. Abdomen: The abdomen is obese soft and nontender without any overt organomegaly or masses.  LABS:  BMET Recent Labs  Lab 06/05/17 0745 06/05/17 0811 06/05/17 1443 06/06/17 0639  NA 137 138 136 138  K 4.0 4.0 4.3 3.3*  CL 101 104 101 104  CO2 12*  --  19* 20*  BUN 7 6 7 7   CREATININE 1.09* 0.80 0.84 0.78  GLUCOSE 500* 474* 412* 133*    Electrolytes Recent Labs  Lab 06/05/17 0745 06/05/17 1443 06/06/17 0639 06/06/17 1208  CALCIUM 9.0 9.3 9.2  --   MG  --  2.3  --  2.1  PHOS  --  2.7  --  2.8    CBC Recent Labs  Lab 06/05/17 0745 06/05/17 0811 06/06/17 0639 06/07/17 0713  WBC 15.1*  --  11.7* 9.3  HGB 12.7 15.0 12.8 11.5*  HCT 41.0 44.0 40.1 36.8  PLT 341  --  254 235    Coag's Recent Labs  Lab 06/05/17 0745  APTT 23*  INR 1.14    Sepsis Markers Recent Labs  Lab 06/05/17 1443 06/06/17 0639 06/06/17 1208  LATICACIDVEN 3.6* 1.7  --   PROCALCITON  --   --  0.47    ABG Recent Labs  Lab 06/05/17 0856 06/07/17 0640  PHART 7.309* 7.453*  PCO2ART 39.1 31.8*  PO2ART 316.0* 70.9*    Liver Enzymes Recent Labs  Lab 06/05/17 0745  AST 48*  ALT 30  ALKPHOS 109  BILITOT 0.8  ALBUMIN 3.7    Cardiac Enzymes No results for input(s): TROPONINI, PROBNP in the last 168 hours.  Glucose Recent Labs  Lab  06/06/17 1745 06/06/17 1858 06/06/17 1942 06/07/17 0013 06/07/17 0443 06/07/17 0727  GLUCAP 134* 174* 178* 141* 223* 238*    Imaging Dg Chest Port 1 View  Result Date: 06/07/2017 CLINICAL DATA:  Seizure activity, extubated EXAM: PORTABLE CHEST 1 VIEW COMPARISON:  06/05/2017 FINDINGS: Patient has been extubated. NG tube also removed. Stable mild cardiomegaly without CHF or pneumonia. Slightly low lung volumes noted. No focal airspace process, significant effusion or pneumothorax. Trachea is midline. Atherosclerosis noted of the aorta. Remote cholecystectomy noted. Degenerative changes of the spine. IMPRESSION: Stable mild cardiomegaly without acute chest process. Thoracic aortic atherosclerosis Electronically Signed   By: Judie Petit.  Shick M.D.   On: 06/07/2017 07:34     STUDIES:  Admission head CT was unremarkable  ANTIBIOTICS: Zosyn started for potential aspiration on 2/10, discontinued on 2/11   DISCUSSION:     This is a 72 year old diabetic with a history of dementia who suffered from a seizure.  There is a distant history of childhood seizures but none as an adult.  She had no remarkable electrolyte abnormalities or unusual findings on the tox screen and initial CT scan of the head was unremarkable.  She has had no further seizure activity since the initiation of Keppra and she has been successfully separated from mechanical ventilation.  She has no clinical evidence of pneumonia and I have discontinued her Zosyn which was started for possible aspiration.  ASSESSMENT / PLAN:  PULMONARY A: Extubation has been well-tolerated, and Zosyn discontinued as noted.   ENDOCRINE A: Insulin has been switched to a subcu regimen which will require adjustment once it is determined that she can take p.o.'s.  I have no feel for her diabetic control at home and whether hypoglycemia may have contributed to her seizure.  NEUROLOGIC A: She is disoriented but nonfocal.  She has had no further seizure  activity on Keppra and I feel it is safe to transfer her out of the intensive care unit.   We will make arrangements to transfer the patient to the primary care service today, please reconsult [place] if further intervention is required as we will not routinely round on this patient after today.   Penny Pia, MD Pulmonary and Critical Care Medicine [facility] Pager: [phone]  06/07/2017, 8:43 AM

## 2017-06-07 NOTE — Progress Notes (Signed)
FPTS Interim Progress Note  Saw and spoke with Paula Reed today. Patient had no questions at this time. Appreciate the excellent care of CCM, will plan to resume care when stable for transfer to the floor - appears to plan for transfer 2/12.  BP 132/83   Pulse 96   Temp 99.9 F (37.7 C) (Core)   Resp (!) 25   Ht 5\' 8"  (1.727 m)   Wt 212 lb 4.9 oz (96.3 kg)   SpO2 95%   BMI 32.28 kg/m     Ellwood DenseRumball, Alison, DO 06/07/2017, 9:11 AM PGY-1, [facility] pager [phone]

## 2017-06-07 NOTE — Progress Notes (Signed)
Called MRI again. They state they have 3 stats ahead of Paula Reed.

## 2017-06-07 NOTE — Progress Notes (Signed)
Pt and family tranferred to 54M room 6. Her clothes are transferred with her in red biohazard bag. She has her upper and lower dentures in her mouth. [facility].

## 2017-06-07 NOTE — Progress Notes (Signed)
Spoke with Paula Reed in MRI. Told her that no more bed bugs have been seen. Told her that per ID we can bring pt down. We told MRI that we are ready when ever they are to come down for MRI.

## 2017-06-07 NOTE — Progress Notes (Signed)
Spoke with lab regarding several attempts at blood cultures with no success. Made CCM aware, WCTM

## 2017-06-07 NOTE — Progress Notes (Signed)
Nutrition Brief Note  RD consulted for tube feeding management. Pt placed on PepUp protocol 2/10. Pt extubated 2/11 and placed on mechanical soft diet. Reports she had eggs and bacon for breakfast this morning without any complication. No skin breakdown noted in chart. Weight records limited, but pt denies any recent wt loss.   Plan for pt to transfer to floor later this afternoon.   Wt Readings from Last 15 Encounters:  06/06/17 212 lb 4.9 oz (96.3 kg)    Body mass index is 32.28 kg/m. Patient meets criteria for obese based on current BMI.   Labs and medications reviewed.   No nutrition interventions warranted at this time. If nutrition issues arise, please consult RD.   Vanessa Kickarly Ayodele Sangalang RD, LDN Clinical Nutrition Pager # [phone]

## 2017-06-07 NOTE — Evaluation (Signed)
Clinical/Bedside Swallow Evaluation Patient Details  Name: Paula Reed MRN: [id number] Date of Birth: [birth year]  Today's Date: 06/07/2017 Time: SLP Start Time (ACUTE ONLY): 1047 SLP Stop Time (ACUTE ONLY): 1102 SLP Time Calculation (min) (ACUTE ONLY): 15 min  Past Medical History:  Past Medical History:  Diagnosis Date  . Diabetes mellitus without complication (HCC)   . Hypertension   . Seizures (HCC)    Past Surgical History: History reviewed. No pertinent surgical history. HPI:  72 year old female with new onset seizures in the setting of dementia. Intubated 02/09-10.    Assessment / Plan / Recommendation Clinical Impression  Pt presents with functional oropharyngeal swallow with mastication impacted by absence of lower dentures (spouse is bringing them to hospital); good coordination of swallow/ventilatory sequence; mild throat clearing initially after consumption of thin liquids, this resolved by end of session; brisk swallow response.  Recommend initiation of soft mechanical diet; thin liquids; give meds whole in puree.  No further SLP f/u warranted.  SLP Visit Diagnosis: Dysphagia, unspecified (R13.10)    Aspiration Risk  No limitations    Diet Recommendation   soft mechanical; thin liquids  Medication Administration: Whole meds with puree    Other  Recommendations Oral Care Recommendations: Oral care BID   Follow up Recommendations None      Frequency and Duration            Prognosis        Swallow Study   General Date of Onset: 06/05/17 HPI: 72 year old female with new onset seizures in the setting of dementia. Intubated 02/09-10.  Type of Study: Bedside Swallow Evaluation Previous Swallow Assessment: no Diet Prior to this Study: NPO Temperature Spikes Noted: Yes Respiratory Status: Room air History of Recent Intubation: Yes Length of Intubations (days): 1 days Date extubated: 06/06/17 Behavior/Cognition: Alert;Cooperative Oral Cavity  Assessment: Within Functional Limits Oral Care Completed by SLP: Recent completion by staff Oral Cavity - Dentition: Dentures, top Vision: Functional for self-feeding Self-Feeding Abilities: Able to feed self Patient Positioning: Upright in bed Baseline Vocal Quality: Normal Volitional Cough: Weak Volitional Swallow: Able to elicit    Oral/Motor/Sensory Function Overall Oral Motor/Sensory Function: Within functional limits   Ice Chips Ice chips: Within functional limits   Thin Liquid Thin Liquid: Within functional limits    Nectar Thick Nectar Thick Liquid: Not tested   Honey Thick Honey Thick Liquid: Not tested   Puree Puree: Within functional limits   Solid   GO   Solid: Impaired(due to absence of lower dentures)        Blenda Mountsouture, Asalee Barrette Laurice 06/07/2017,11:07 AM

## 2017-06-07 NOTE — Evaluation (Signed)
Physical Therapy Evaluation Patient Details Name: Paula Reed MRN: [id number] DOB: 05-01-1945 Today's Date: 06/07/2017   History of Present Illness  72 y.o. female admitted on 06/05/17 for status epilepticus (intubated for airway protection and sedated in the ED). Extubated 04/05/18.  Pt dx with seizure and keppra initiated.  MRI pending.  Pt with significant PMH of dementia, seizures as a child, and DM. Also, pt on contact precautions for bed bugs.    Clinical Impression  Limited evaluation as pt was transporting to another unit.  She presented with gross weak strength overall in LEs, however, functionally she seemed to slip her left arm off of the bed when attempting to push back and scoot.  May have a tad of post ictal weakness on the left to be further assessed during hallway ambulation tomorrow.  I anticipate the more she mobilizes the closer she will get to baseline.  Not sure I can send home therapy out as she seems to have an active bed bug infestation, but that is what I would recommend if able.  PT to follow acutely for deficits listed below.       Follow Up Recommendations Home health PT;Supervision/Assistance - 24 hour    Equipment Recommendations  None recommended by PT    Recommendations for Other Services   NA    Precautions / Restrictions Precautions Precautions: Fall Precaution Comments: dementia and some very mild left sided functional weakness      Mobility  Bed Mobility Overal bed mobility: Needs Assistance Bed Mobility: Sit to Supine       Sit to supine: Min guard   General bed mobility comments: Min guard assist to help position legs back into bed from sitting  Transfers Overall transfer level: Needs assistance Equipment used: 1 person hand held assist Transfers: Sit to/from [employer]GI CorporationStand;Stand Pivot Transfers Sit to Stand: Min assist Stand pivot transfers: Min assist       General transfer comment: Heavy min assist to stand from low recliner chair and  transfer a few pivotal steps back to bed.  Further gait not assessed at this time as pt is ready to transfer to a new unit.         Balance Overall balance assessment: Needs assistance Sitting-balance support: Feet supported;No upper extremity supported Sitting balance-Leahy Scale: Good     Standing balance support: Single extremity supported Standing balance-Leahy Scale: Fair                               Pertinent Vitals/Pain Pain Assessment: No/denies pain    Home Living Family/patient expects to be discharged to:: Private residence Living Arrangements: Spouse/significant other Available Help at Discharge: Family;Available 24 hours/day;Other (Comment)(spouse with her all the time, family lives down the street) Type of Home: House Home Access: Level entry     Home Layout: One level Home Equipment: None Additional Comments: Son providing history to the best of his ability    Prior Function Level of Independence: Independent         Comments: does not drive, does some light cleaning     Hand Dominance   Dominant Hand: Right    Extremity/Trunk Assessment   Upper Extremity Assessment Upper Extremity Assessment: Defer to OT evaluation    Lower Extremity Assessment Lower Extremity Assessment: Generalized weakness(some functional left UE weakness noted, but none in LEs)    Cervical / Trunk Assessment Cervical / Trunk Assessment: Normal  Communication   Communication:  No difficulties  Cognition Arousal/Alertness: Awake/alert Behavior During Therapy: WFL for tasks assessed/performed Overall Cognitive Status: History of cognitive impairments - at baseline                                 General Comments: Per family she is very close to her cognitive baseline.              Assessment/Plan    PT Assessment Patient needs continued PT services  PT Problem List Decreased strength;Decreased activity tolerance;Decreased  balance;Decreased mobility;Decreased coordination;Decreased cognition;Decreased knowledge of use of DME;Decreased safety awareness;Decreased knowledge of precautions       PT Treatment Interventions DME instruction;Gait training;Functional mobility training;Therapeutic activities;Therapeutic exercise;Balance training;Neuromuscular re-education;Patient/family education;Cognitive remediation    PT Goals (Current goals can be found in the Care Plan section)  Acute Rehab PT Goals Patient Stated Goal: unable to state sons would like her home PT Goal Formulation: With family Time For Goal Achievement: 06/21/17 Potential to Achieve Goals: Good    Frequency Min 3X/week           AM-PAC PT "6 Clicks" Daily Activity  Outcome Measure Difficulty turning over in bed (including adjusting bedclothes, sheets and blankets)?: Unable Difficulty moving from lying on back to sitting on the side of the bed? : Unable Difficulty sitting down on and standing up from a chair with arms (e.g., wheelchair, bedside commode, etc,.)?: Unable Help needed moving to and from a bed to chair (including a wheelchair)?: A Little Help needed walking in hospital room?: A Little Help needed climbing 3-5 steps with a railing? : A Lot 6 Click Score: 11    End of Session   Activity Tolerance: Patient tolerated treatment well Patient left: in bed;with nursing/sitter in room   PT Visit Diagnosis: Unsteadiness on feet (R26.81);Other symptoms and signs involving the nervous system (R29.898);Difficulty in walking, not elsewhere classified (R26.2)    Time: 1610-9604 PT Time Calculation (min) (ACUTE ONLY): 14 min   Charges:        Lurena Joiner B. Tamanna Whitson, PT, DPT [phone]   PT Evaluation $PT Eval Moderate Complexity: 1 Mod        06/07/2017, 5:42 PM

## 2017-06-08 ENCOUNTER — Inpatient Hospital Stay (HOSPITAL_COMMUNITY): Payer: Medicare Other

## 2017-06-08 DIAGNOSIS — R739 Hyperglycemia, unspecified: Secondary | ICD-10-CM

## 2017-06-08 LAB — BASIC METABOLIC PANEL
Anion gap: 16 — ABNORMAL HIGH (ref 5–15)
BUN: 7 mg/dL (ref 6–20)
CO2: 17 mmol/L — ABNORMAL LOW (ref 22–32)
Calcium: 8.6 mg/dL — ABNORMAL LOW (ref 8.9–10.3)
Chloride: 106 mmol/L (ref 101–111)
Creatinine, Ser: 0.78 mg/dL (ref 0.44–1.00)
GFR calc Af Amer: >60 mL/min (ref >60)
GFR calc non Af Amer: >60 mL/min (ref >60)
Glucose, Bld: 163 mg/dL — ABNORMAL HIGH (ref 65–99)
Potassium: 4 mmol/L (ref 3.5–5.1)
Sodium: 139 mmol/L (ref 135–145)

## 2017-06-08 LAB — GLUCOSE, CAPILLARY
Glucose-Capillary: 152 mg/dL — ABNORMAL HIGH (ref 65–99)
Glucose-Capillary: 177 mg/dL — ABNORMAL HIGH (ref 65–99)
Glucose-Capillary: 212 mg/dL — ABNORMAL HIGH (ref 65–99)
Glucose-Capillary: 154 mg/dL — ABNORMAL HIGH (ref 65–99)

## 2017-06-08 LAB — PROCALCITONIN: Procalcitonin: 0.19 ng/mL

## 2017-06-08 MED ORDER — LEVETIRACETAM 500 MG PO TABS
500.0000 mg | ORAL_TABLET | Freq: Two times a day (BID) | ORAL | Status: DC
  Administered 2017-06-08 – 2017-06-09 (×2): 500 mg via ORAL
  Filled 2017-06-08 (×2): qty 1

## 2017-06-08 NOTE — Progress Notes (Signed)
Dr. Linwood Dibblesumball paged and returned call.  States pt can go for MRI without RN in attendance.

## 2017-06-08 NOTE — Progress Notes (Signed)
Paula Reed is a 72 y.o. female patient admitted from ED awake, alert - oriented  X 4 - no acute distress noted.  VSS - Blood pressure (!) 98/54, pulse (!) 102, temperature (!) 101 F (38.3 C), temperature source Oral, resp. rate (!) 23, height 5\' 8"  (1.727 m), weight 96.1 kg (211 lb 13.8 oz), SpO2 97 %.    IV in place, occlusive dsg intact without redness.  Orientation to room, and floor completed with information packet given to patient/family.  Patient declined safety video at this time.  Admission INP armband ID verified with patient/family, and in place.   SR up x 2, fall assessment complete, with patient and family able to verbalize understanding of risk associated with falls, and verbalized understanding to call nsg before up out of bed.  Call light within reach, patient able to voice, and demonstrate understanding.  Skin, clean-dry- intact without evidence of bruising, or skin tears.   No evidence of skin break down noted on exam.     Will cont to eval and treat per MD orders.  Marca AnconaLaura M Ivee Poellnitz, RN 06/08/2017 5:35 PM

## 2017-06-08 NOTE — Progress Notes (Signed)
Consult for Advance Directive. This patient is not able to do this due to her not being aware. To complete AD patient has to be the one who wants to do it.  Due to circumstances, chaplain not able to complete AD forms. Phebe CollaDonna S Ledford Goodson, Chaplain   06/08/17 1000  Clinical Encounter Type  Visited With Other (Comment)  Referral From Physician  Consult/Referral To Chaplain

## 2017-06-08 NOTE — Progress Notes (Signed)
Report called to Leslie DalesLaura Archer, RN who will assume care of pt upon arrival to 5W.  States they are going to change room # due to pt fall risk and place her closer to nursing station.  Will call back when room ready.

## 2017-06-08 NOTE — Progress Notes (Signed)
Physical Therapy Treatment Patient Details Name: Paula Reed MRN: [id number] DOB: October 28, 1945 Today's Date: 06/08/2017    History of Present Illness 72 y.o. female admitted on 06/05/17 for status epilepticus (intubated for airway protection and sedated in the ED). Extubated 04/05/18.  Pt dx with seizure and keppra initiated.  MRI pending.  Pt with significant PMH of dementia, seizures as a child, and DM. Also, pt on contact precautions for bed bugs.      PT Comments    Pt is progressing well with gait and mobility and is a bit more unsteady on her feet than usual, but only requires light hand held assist to ambulate the halls.  I believe that if she walks more frequently here with [place], she will return to her baseline sooner.     Follow Up Recommendations  Home health PT;Supervision/Assistance - 24 hour(can we do this with active bed bugs?)     Equipment Recommendations  None recommended by PT    Recommendations for Other Services   NA     Precautions / Restrictions Precautions Precautions: Fall Precaution Comments: mildly unsteady on her feet    Mobility  Bed Mobility               General bed mobility comments: Pt is OOB in the recliner chair.   Transfers Overall transfer level: Needs assistance Equipment used: 1 person hand held assist Transfers: Sit to/from Stand Sit to Stand: Min guard         General transfer comment: Min guard assist to steady pt for balance during transitions.   Ambulation/Gait Ambulation/Gait assistance: Min guard Ambulation Distance (Feet): 130 Feet Assistive device: 1 person hand held assist Gait Pattern/deviations: Step-through pattern;Staggering left;Staggering right Gait velocity: decreased   General Gait Details: Pt with mildly staggering gait pattern, min guard assist to help with balance, pt and family report she is currently more unsteady than usual.  Pt reports, "It is because I haven't walked"          Balance  Overall balance assessment: Needs assistance Sitting-balance support: Feet supported;No upper extremity supported Sitting balance-Leahy Scale: Good     Standing balance support: Single extremity supported Standing balance-Leahy Scale: Fair                              Cognition Arousal/Alertness: Awake/alert Behavior During Therapy: WFL for tasks assessed/performed Overall Cognitive Status: History of cognitive impairments - at baseline                                 General Comments: PT seems even more cognitively intact today.  She was able to tell me her son's name and husband's name.              Pertinent Vitals/Pain Pain Assessment: No/denies pain           PT Goals (current goals can now be found in the care plan section) Acute Rehab PT Goals Patient Stated Goal: pt would like to walk more or go home.  Progress towards PT goals: Progressing toward goals    Frequency    Min 3X/week      PT Plan Current plan remains appropriate       AM-PAC PT "6 Clicks" Daily Activity  Outcome Measure  Difficulty turning over in bed (including adjusting bedclothes, sheets and blankets)?: Unable Difficulty moving from lying on back  to sitting on the side of the bed? : Unable Difficulty sitting down on and standing up from a chair with arms (e.g., wheelchair, bedside commode, etc,.)?: Unable Help needed moving to and from a bed to chair (including a wheelchair)?: A Little Help needed walking in hospital room?: A Little Help needed climbing 3-5 steps with a railing? : A Little 6 Click Score: 12    End of Session   Activity Tolerance: Patient tolerated treatment well Patient left: in chair;with call bell/phone within reach;with family/visitor present Nurse Communication: Mobility status PT Visit Diagnosis: Unsteadiness on feet (R26.81);Other symptoms and signs involving the nervous system (R29.898);Difficulty in walking, not elsewhere classified  (R26.2)     Time: [id number] PT Time Calculation (min) (ACUTE ONLY): 18 min  Charges:  $Gait Training: 8-22 mins          Paula Reed, PT, DPT [phone]             06/08/2017, 3:32 PM

## 2017-06-08 NOTE — Progress Notes (Signed)
Pt taken to 5W via WC by Selena BattenKim, NT/RN to [id number].  No s/s of distress.  Respirations even and unlabored.

## 2017-06-08 NOTE — Progress Notes (Signed)
OT Cancellation Note  Patient Details Name: Phebe CollaBetty L Akerley MRN: [id number] DOB: 1946-02-09   Cancelled Treatment:    Reason Eval/Treat Not Completed: Patient at procedure or test/ unavailable(MRI)  [facility]ore[facility] Asc IncWARD,HILLARY  Chrystopher Stangl, OT/L  [phone] 06/08/2017 06/08/2017, 11:50 AM

## 2017-06-08 NOTE — Progress Notes (Signed)
At this point MRI is negative.  No further neurological workup recommended.  Recommend continuing Keppra 500 mg twice a day.  Patient should follow-up with neurologist within 2 weeks.  Dr. Karel JarvisAquino of [facility] is a epileptic neurologist that she can follow-up with.  At this time neurology will sign off.  Felicie Mornavid Smith PA-C [facility] [phone]  M-F  (8:30 am- 4 PM)  06/08/2017, 12:33 PM

## 2017-06-08 NOTE — Progress Notes (Signed)
Family Medicine Teaching Service Daily Progress Note Intern Pager: [phone]  Patient name: Paula Reed Medical record number: [id number] Date of birth: Dec 12, 1945 Age: 72 y.o. Gender: female  Primary Care Provider: Latrelle Dodrill, MD Consultants: CCM Code Status: Full  Pt Overview and Major Events to Date:  2/09 - admitted in status epilepticus, intubated for airway protection 2/10 - extubated 2/12 - transfer to FPTS  Assessment and Plan: Paula Reed is a 72yo F with PMH significant for Type 2 Diabetes, HLD, dementia, HTN, polio with no residual weakness who presented in status epilepticus on 2/9 and was intubated for airway protection.   Seizure  Dementia Admitted in status epilepticus and intubated for airway protection. Received benzo via EMS prior to arrival to the ED. In the ED, received Versed and loaded with Keppra, managed on maintenance Keppra dosing since. Initial labs glucose 500, Cr 1.09, AST slightly elevated at 48, WBC 15.1 otherwise within normal limits. Trop 0.07. Initial CT head without acute finding or seizure focus. Was extubated 2/10. Spiked fever to 101.35F 2/10 and started on Zosyn (2/10-2/11) for aspiration coverage, blood cultures drawn and tracheal aspirate taken. Leukocytosis now normalized. Neurology following this admission, believes due to dementia. On Aricept 5mg  at home. Lives at home with husband, not able to do most ADLs, no IADLs per sister. Remote h/o seizures as a child. - continue maintenance Keppra dosing - 500mg  BID - f/u blood cultures and tracheal aspirate - seizure precautions - neurology following, appreciate recs - MRI brain - diet - whole meds with puree - PT/OT - HHPT - 1/2 NS @50ml /hr - cardiac monitoring - continuous pulse ox - strict I&Os  Type 2 Diabetes Last A1c 6.2 02/2017. A1c 10.0 this admission. Glucose 500 on admission. Had need for insulin gtt during ICU stay. Now managed on Lantus and mSSI. Per home med list,  previously on Metformin 1000mg  BID but doesn't look like she is currently managed on insulin or oral diabetes meds. Per husband, PCP recently discontinued due to skin color changes. CBG 163 this am. 14u Novolog received last 24 hours. - Lantus 15u daily qhs - mSSI with qhs correction  Hypertension Stable and at goal. 139/60 this am. Takes metoprolol 25mg  BID at home.   - holding home meds  HLD Last lipid panel wnl 05/2016. Not currently on a statin per husband's preference on chart review.  FEN/GI: whole meds with puree PPx: heparin, SCDs  Disposition: continue inpatient management for seizure  Subjective:  Patient without pain or difficulty breathing today. Oriented to self and place. Sister and husband provide most of history.  Objective: Temp:  [97.6 F (36.4 C)-99.9 F (37.7 C)] 97.6 F (36.4 C) (02/12 0300) Pulse Rate:  [53-107] 90 (02/12 0400) Resp:  [16-27] 18 (02/12 0400) BP: (117-162)/(57-102) 139/60 (02/12 0400) SpO2:  [90 %-100 %] 90 % (02/12 0400) Weight:  [211 lb 13.8 oz (96.1 kg)] 211 lb 13.8 oz (96.1 kg) (02/11 2020) Physical Exam: General: pleasant female lying in bed, in NAD Cardiovascular: RRR, no murmur/rubs/gallops Respiratory: CTAB, no wheezes/rales/rhonchi Abdomen: soft, obese abdomen, +BS, nontender Extremities: warm and well perfused, SCDs in place Neuro: alert and oriented to self and place  Laboratory: Recent Labs  Lab 06/05/17 0745 06/05/17 0811 06/06/17 0639 06/07/17 0713  WBC 15.1*  --  11.7* 9.3  HGB 12.7 15.0 12.8 11.5*  HCT 41.0 44.0 40.1 36.8  PLT 341  --  254 235   Recent Labs  Lab 06/05/17 0745  06/06/17 1478  06/07/17 0905 06/08/17 0355  NA 137   < > 138 138 139  K 4.0   < > 3.3* 3.0* 4.0  CL 101   < > 104 106 106  CO2 12*   < > 20* 22 17*  BUN 7   < > 7 5* 7  CREATININE 1.09*   < > 0.78 0.77 0.78  CALCIUM 9.0   < > 9.2 8.4* 8.6*  PROT 7.7  --   --   --   --   BILITOT 0.8  --   --   --   --   ALKPHOS 109  --   --   --    --   ALT 30  --   --   --   --   AST 48*  --   --   --   --   GLUCOSE 500*   < > 133* 192* 163*   < > = values in this interval not displayed.   Imaging/Diagnostic Tests: Dg Chest Port 1 View  Result Date: 06/07/2017 CLINICAL DATA:  Seizure activity, extubated EXAM: PORTABLE CHEST 1 VIEW COMPARISON:  06/05/2017 FINDINGS: Patient has been extubated. NG tube also removed. Stable mild cardiomegaly without CHF or pneumonia. Slightly low lung volumes noted. No focal airspace process, significant effusion or pneumothorax. Trachea is midline. Atherosclerosis noted of the aorta. Remote cholecystectomy noted. Degenerative changes of the spine. IMPRESSION: Stable mild cardiomegaly without acute chest process. Thoracic aortic atherosclerosis Electronically Signed   By: Judie PetitM.  Shick M.D.   On: 06/07/2017 07:34   Ellwood DenseRumball, Alison, DO 06/08/2017, 7:11 AM PGY-1, [facility] FPTS Intern pager: [phone], text pages welcome

## 2017-06-09 LAB — GLUCOSE, CAPILLARY
Glucose-Capillary: 145 mg/dL — ABNORMAL HIGH (ref 65–99)
Glucose-Capillary: 219 mg/dL — ABNORMAL HIGH (ref 65–99)

## 2017-06-09 LAB — CBC
HCT: 38.3 % (ref 36.0–46.0)
Hemoglobin: 12.2 g/dL (ref 12.0–15.0)
MCH: 28.8 pg (ref 26.0–34.0)
MCHC: 31.9 g/dL (ref 30.0–36.0)
MCV: 90.3 fL (ref 78.0–100.0)
Platelets: 264 10*3/uL (ref 150–400)
RBC: 4.24 MIL/uL (ref 3.87–5.11)
RDW: 14.4 % (ref 11.5–15.5)
WBC: 8.7 10*3/uL (ref 4.0–10.5)

## 2017-06-09 MED ORDER — INSULIN GLARGINE 100 UNIT/ML ~~LOC~~ SOLN: 21.0000 [IU] | Freq: Every day | SUBCUTANEOUS | 0 refills | Status: DC

## 2017-06-09 MED ORDER — LEVETIRACETAM 500 MG PO TABS: 500.0000 mg | ORAL_TABLET | Freq: Two times a day (BID) | ORAL | 0 refills | Status: DC

## 2017-06-09 NOTE — Care Management Note (Signed)
Case Management Note  Patient Details  Name: Paula Reed MRN: [id number] Date of Birth: [birth year]  Subjective/Objective:                  Admitted  for status epilepticus. From home with husband.  PCP:  Levert FeinsteinBrittany McIntyre   Action/Plan: Transition to home with home health services to follow. Per pt's husband Paula Reed, pt's home has been treated for bed bugs. Husband to provide transportation to home.  Expected Discharge Date:  06/09/17               Expected Discharge Plan:  Home w Home Health Services  In-House Referral:     Discharge planning Services  CM Consult  Post Acute Care Choice:    Choice offered to:  Patient  DME Arranged:    DME Agency:     HH Arranged:  PT HH Agency:  [facility]  Status of Service:  Completed, signed off  If discussed at Long Length of Stay Meetings, dates discussed:    Additional Comments:  Epifanio LeschesCole, Amdrew Oboyle Hudson, RN 06/09/2017, 2:20 PM

## 2017-06-09 NOTE — Discharge Instructions (Signed)
You were admitted for a new seizure and was managed on an anti-seizure medication. Neurology followed throughout this hospitalization and recommended outpatient follow up in 2 weeks. Your blood sugar was elevated on admission and were managed on insulin which you will continue on discharge. You should follow up with your primary doctor for titration of your diabetic regimen. Your blood pressure was managed well without your home blood pressure medicine. We are holding this on discharge.

## 2017-06-09 NOTE — Evaluation (Signed)
Occupational Therapy Evaluation Patient Details Name: Paula Reed MRN: [id number] DOB: 1945/09/28 Today's Date: 06/09/2017    History of Present Illness 72 y.o. female admitted on 06/05/17 for status epilepticus (intubated for airway protection and sedated in the ED). Extubated 04/05/18.  Pt dx with seizure and keppra initiated.  MRI pending.  Pt with significant PMH of dementia, seizures as a child, and DM. Also, pt on contact precautions for bed bugs.     Clinical Impression   Pt reports she was independent with ADL PTA. Currently pt overall supervision for ADL and functional mobility. Pt with mild balance deficits and short term memory deficits noted. Pt planning to d/c home with 24/7 supervision from family. Pt would benefit from continued skilled OT to address established goals.    Follow Up Recommendations  No OT follow up;Supervision/Assistance - 24 hour    Equipment Recommendations  None recommended by OT    Recommendations for Other Services       Precautions / Restrictions Precautions Precautions: Fall Precaution Comments: bed bugs Restrictions Weight Bearing Restrictions: No      Mobility Bed Mobility               General bed mobility comments: Pt sitting EOB upon arrival  Transfers Overall transfer level: Needs assistance Equipment used: None Transfers: Sit to/from Stand Sit to Stand: Supervision         General transfer comment: Supervision for safety, no unsteadiness noted    Balance Overall balance assessment: Needs assistance Sitting-balance support: Feet supported;No upper extremity supported Sitting balance-Leahy Scale: Good     Standing balance support: No upper extremity supported;During functional activity Standing balance-Leahy Scale: Fair                             ADL either performed or assessed with clinical judgement   ADL Overall ADL's : Needs assistance/impaired Eating/Feeding: Set up;Sitting   Grooming:  Supervision/safety;Standing;Wash/dry hands;Wash/dry face   Upper Body Bathing: Set up;Supervision/ safety;Sitting   Lower Body Bathing: Supervison/ safety;Sit to/from stand   Upper Body Dressing : Supervision/safety;Sitting   Lower Body Dressing: Supervision/safety;Sit to/from stand   Toilet Transfer: Supervision/safety;Ambulation;Regular Teacher, adult education Details (indicate cue type and reason): Simulated     Tub/ Shower Transfer: Supervision/safety;Tub transfer;Ambulation Tub/Shower Transfer Details (indicate cue type and reason): Simulated in room. Pt reports husband always home during bathing Functional mobility during ADLs: Supervision/safety       Vision         Perception     Praxis      Pertinent Vitals/Pain Pain Assessment: No/denies pain     Hand Dominance Right   Extremity/Trunk Assessment Upper Extremity Assessment Upper Extremity Assessment: Overall WFL for tasks assessed   Lower Extremity Assessment Lower Extremity Assessment: Defer to PT evaluation   Cervical / Trunk Assessment Cervical / Trunk Assessment: Normal   Communication Communication Communication: No difficulties   Cognition Arousal/Alertness: Awake/alert Behavior During Therapy: WFL for tasks assessed/performed Overall Cognitive Status: No family/caregiver present to determine baseline cognitive functioning                                 General Comments: Pt appropriate during all functional tasks; mild memory deficits noted   General Comments       Exercises     Shoulder Instructions      Home Living Family/patient expects to be discharged  to:: Private residence Living Arrangements: Spouse/significant other Available Help at Discharge: Family;Available 24 hours/day;Other (Comment) Type of Home: House Home Access: Level entry     Home Layout: One level     Bathroom Shower/Tub: Tub/shower unit;Curtain   Bathroom Toilet: Standard     Home  Equipment: None          Prior Functioning/Environment Level of Independence: Independent                 OT Problem List: Decreased activity tolerance;Impaired balance (sitting and/or standing);Decreased knowledge of use of DME or AE;Decreased safety awareness;Obesity      OT Treatment/Interventions: Self-care/ADL training;Therapeutic exercise;Energy conservation;DME and/or AE instruction;Therapeutic activities;Patient/family education;Balance training    OT Goals(Current goals can be found in the care plan section) Acute Rehab OT Goals Patient Stated Goal: home today OT Goal Formulation: With patient Time For Goal Achievement: 06/23/17 Potential to Achieve Goals: Good ADL Goals Pt Will Perform Tub/Shower Transfer: with modified independence;ambulating;Tub transfer Additional ADL Goal #1: Pt will gather ADL items and perform ADL with mod I.  OT Frequency: Min 2X/week   Barriers to D/C:            Co-evaluation              AM-PAC PT "6 Clicks" Daily Activity     Outcome Measure Help from another person eating meals?: None Help from another person taking care of personal grooming?: A Little Help from another person toileting, which includes using toliet, bedpan, or urinal?: A Little Help from another person bathing (including washing, rinsing, drying)?: A Little Help from another person to put on and taking off regular upper body clothing?: A Little Help from another person to put on and taking off regular lower body clothing?: A Little 6 Click Score: 19   End of Session    Activity Tolerance: Patient tolerated treatment well Patient left: in chair;with call bell/phone within reach  OT Visit Diagnosis: Unsteadiness on feet (R26.81)                Time: [id number] OT Time Calculation (min): 10 min Charges:  OT General Charges $OT Visit: 1 Visit OT Evaluation $OT Eval Low Complexity: 1 Low G-Codes:     Torianne Laflam A. Brett Albinooffey, M.S., OTR/L Pager:  [phone]  Gaye AlkenBailey A Yeriel Mineo 06/09/2017, 2:33 PM

## 2017-06-09 NOTE — Progress Notes (Signed)
Patient discharge teaching given, including activity, diet, follow-up appoints, and medications. Patient verbalized understanding of all discharge instructions. IV access was d/c'd. Vitals are stable. Skin is intact except as charted in most recent assessments. Pt to be escorted out by NT, to be driven home by family. 

## 2017-06-09 NOTE — Progress Notes (Signed)
Family Medicine Teaching Service Daily Progress Note Intern Pager: [phone]  Patient name: Paula Reed Medical record number: [id number] Date of birth: 1946-01-23 Age: 72 y.o. Gender: female  Primary Care Provider: Latrelle DodrillMcIntyre, Brittany J, MD Consultants: CCM Code Status: Full  Pt Overview and Major Events to Date:  2/09 - admitted in status epilepticus, intubated for airway protection 2/10 - extubated 2/12 - transfer to FPTS  Assessment and Plan: Paula Reed is a 72yo F with PMH significant for Type 2 Diabetes, HLD, dementia, HTN, polio with no residual weakness who presented in status epilepticus on 2/9 and was intubated for airway protection.   Seizure  Dementia Admitted in status epilepticus and intubated for airway protection. Received benzo via EMS prior to arrival to the ED. In the ED, received Versed and loaded with Keppra, managed on maintenance Keppra dosing since. Initial labs glucose 500, Cr 1.09, AST slightly elevated at 48, WBC 15.1 otherwise within normal limits. Trop 0.07. Initial CT head without acute finding or seizure focus. Was extubated 2/10. Spiked fever to 101.49F 2/10 and started on Zosyn (2/10-2/11) for aspiration coverage, tracheal aspirate taken. Leukocytosis now normalized. Neurology following this admission, believes possibly due to dementia, now signed off, will follow outpatient. On Aricept 5mg  at home. Remote h/o seizures as a child but none as an adult. With slight metabolic derangements on admission, unclear if due to seizures or precipating cause of seizures as MRI brain without structural abnormality. Did spike fever overnight but not sustained. Transferred out of stepdown yesterday. - continue maintenance Keppra dosing - 500mg  BID - f/u tracheal aspirate - seizure precautions - neurology following, now signed off - MRI brain - diet - whole meds with puree - PT/OT - HHPT - cardiac monitoring - continuous pulse ox - strict I&Os  Type 2  Diabetes Last A1c 6.2 02/2017. A1c 10.0 this admission. Glucose 500 on admission. Had need for insulin gtt during ICU stay. Now managed on Lantus and mSSI. Per home med list, previously on Metformin 1000mg  BID but due to skin color changes, was discontinued. No other medications or insulin for home management. CBG 154 this am. 11u Novolog received last 24 hours. - Lantus 15u daily qhs - mSSI with qhs correction  Hypertension Stable and at goal. 141/70 this am. Takes metoprolol 25mg  BID at home.   - holding home meds  HLD Last lipid panel wnl 05/2016. Not currently on a statin per husband's preference on chart review.  FEN/GI: whole meds with puree PPx: heparin, SCDs  Disposition: continue inpatient management for seizure  Subjective:  Patient feeling well this morning. Denies pain or trouble breathing.  Objective: Temp:  [97.8 F (36.6 C)-101 F (38.3 C)] 97.8 F (36.6 C) (02/13 0639) Pulse Rate:  [81-102] 81 (02/13 0639) Resp:  [19-23] 19 (02/13 0639) BP: (98-160)/(54-99) 141/70 (02/13 0639) SpO2:  [97 %-99 %] 98 % (02/13 0639) Weight:  [216 lb 7.9 oz (98.2 kg)] 216 lb 7.9 oz (98.2 kg) (02/13 0700) Physical Exam: General: pleasant female lying in bed, in NAD Cardiovascular: RRR, no murmur/rubs/gallops Respiratory: CTAB, no wheezes/rales/rhonchi Abdomen: soft, obese abdomen, +BS, nontender Extremities: warm and well perfused, SCDs in place  Laboratory: Recent Labs  Lab 06/05/17 0745 06/05/17 0811 06/06/17 0639 06/07/17 0713  WBC 15.1*  --  11.7* 9.3  HGB 12.7 15.0 12.8 11.5*  HCT 41.0 44.0 40.1 36.8  PLT 341  --  254 235   Recent Labs  Lab 06/05/17 0745  06/06/17 0639 06/07/17 0905 06/08/17 0355  NA 137   < > 138 138 139  K 4.0   < > 3.3* 3.0* 4.0  CL 101   < > 104 106 106  CO2 12*   < > 20* 22 17*  BUN 7   < > 7 5* 7  CREATININE 1.09*   < > 0.78 0.77 0.78  CALCIUM 9.0   < > 9.2 8.4* 8.6*  PROT 7.7  --   --   --   --   BILITOT 0.8  --   --   --   --    ALKPHOS 109  --   --   --   --   ALT 30  --   --   --   --   AST 48*  --   --   --   --   GLUCOSE 500*   < > 133* 192* 163*   < > = values in this interval not displayed.   Imaging/Diagnostic Tests: Mr Brain Wo Contrast  Result Date: 06/08/2017 CLINICAL DATA:  New onset seizures.  History of dementia. EXAM: MRI HEAD WITHOUT CONTRAST TECHNIQUE: Multiplanar, multiecho pulse sequences of the brain and surrounding structures were obtained without intravenous contrast. COMPARISON:  Head CT 06/05/2017 FINDINGS: The study is mildly motion degraded. Brain: There is no evidence of acute infarct, mass, midline shift, or extra-axial fluid collection. A partially empty sella is incidentally noted. There is a chronic microhemorrhage in the posterior left temporal lobe. There is asymmetric left mesial temporal lobe volume loss, primarily at the level of the uncus and possibly involving the hippocampal head. No gross hippocampal T2 signal abnormality is identified. Scattered small foci of cerebral white matter T2 hyperintensity bilaterally are nonspecific but compatible with mild chronic small vessel ischemic disease. Chronic lacunar infarcts are noted in the left thalamus and left paramedian pons. Vascular: Major intracranial vascular flow voids are preserved. Skull and upper cervical spine: No suspicious marrow lesion. Hyperostosis frontalis interna. Sinuses/Orbits: Unremarkable orbits. Minimal mucosal thickening in the paranasal sinuses. No significant mastoid fluid. 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 IMPRESSION: 1. No acute intracranial abnormality. 2. Asymmetric mesial left temporal lobe volume loss. 3. Mild chronic small vessel ischemic disease. Electronically Signed   By: Sebastian Ache M.D.   On: 06/08/2017 12:07   Ellwood Dense, DO 06/09/2017, 9:39 AM PGY-1, [facility] FPTS Intern pager: [phone], text pages welcome

## 2017-06-10 ENCOUNTER — Encounter: Payer: Self-pay | Admitting: Family Medicine

## 2017-06-10 NOTE — Discharge Summary (Signed)
Family Medicine Teaching [facility] Discharge Summary  Patient name: Paula Reed Medical record number: [id number] Date of birth: 21-Sep-1945 Age: 72 y.o. Gender: female Date of Admission: 06/05/2017  Date of Discharge: 06/09/2017 Admitting Physician: Elayne Snare, MD  Primary Care Provider: Latrelle Dodrill, MD Consultants: CCM, Neurology  Indication for Hospitalization: New-onset seizure  Discharge Diagnoses/Problem List:  New-onset seizure Dementia Type 2 Diabetes, stable Hypertension, stable Hyperlipidemia, stable  Disposition: Home  Discharge Condition: Improved  Discharge Exam:  General: pleasant female lying in bed, in NAD Cardiovascular: RRR, no murmur/rubs/gallops Respiratory: CTAB, no wheezes/rales/rhonchi Abdomen: soft, obese abdomen, +BS, nontender Extremities: warm and well perfused, SCDs in place  Brief Hospital Course:  Paula Reed is a 72yo F with PMH significant for Type 2 Diabetes, HLD, dementia, HTN, polio with no residual weakness, remote h/o childhood seizure who presented in status epilepticus on 2/9 and was intubated for airway protection. Last known normal was the night prior. Seizure witnessed by husband who called EMS. EMS gave a dose of benzodiazepine en route. She then received a loading dose of Keppra in the ED and intubated for airway protection. Admitting labs notable for glucose 500, Cr 1.09, AST slightly elevated at 48, istat trop 0.07, WBC 15.1, EtOH negative, UDS +benzo (given via EMS). EKG sinus tachycardia. U/A within normal limits. CT Head without acute finding and no seizure focus noted. Neurology consulted in the ED and followed throughout admission who believed presenting seizure was due to patient's underlying dementia. Admitted to the ICU given intubation where she was continued on maintenance dosing of Keppra.  She was extubated on 2/10 without complication. She did however spike a fever to 101.19F the night of 2/10.  Patient was started on Zosyn and maintained for approximately 24 hours but discontinued after clinical improvement. Patient transferred to [facility] 2/12. MRI Brain obtained 2/12 with no acute abnormality, did show mild chronic small vessel ischemic disease and asymmetric temporal atrophy. Patient continued on maintenance Keppra without further seizures.  Patient also with history of diabetes, A1c this admission 10.0. Patient's Metformin discontinued November 2018 due to skin hyperpigmentation and A1c at that time 6.2. Patient's glucose on admission 500. Unlikely that hyperglycemia precipitated seizure, however with underlying dementia patient has less reserve. Patient managed on 15u Lantus and sliding scale insulin with decent control of blood glucose. Will be discharged with Lantus with the goal to titrate to ideal diabetic regimen outpatient. Patient's Metoprolol was held and blood pressure remained stable without intervention, therefore was not continued on discharge. Consider restarting if needed.  Issues for Follow Up:  1. Medication Changes: 1. Started Keppra 500mg  BID 2. Started Lantus 21u daily, titrate regimen as needed 3. Stopped Metoprolol tartrate 25mg  BID 4. Continued aspirin 81mg , Aricept 5mg  2. Patient will follow up with Neurology 2 weeks after discharge. 3. Patient will receive HHPT.  Significant Procedures: Intubation  Significant Labs and Imaging:  Recent Labs  Lab 06/06/17 0639 06/07/17 0713 06/09/17 0941  WBC 11.7* 9.3 8.7  HGB 12.8 11.5* 12.2  HCT 40.1 36.8 38.3  PLT 254 235 264   Recent Labs  Lab 06/05/17 0745 06/05/17 0811 06/05/17 1443 06/06/17 0639 06/06/17 1208 06/07/17 0905 06/07/17 1650 06/08/17 0355  NA 137 138 136 138  --  138  --  139  K 4.0 4.0 4.3 3.3*  --  3.0*  --  4.0  CL 101 104 101 104  --  106  --  106  CO2 12*  --  19*  20*  --  22  --  17*  GLUCOSE 500* 474* 412* 133*  --  192*  --  163*  BUN 7 6 7 7   --  5*  --  7  CREATININE 1.09* 0.80  0.84 0.78  --  0.77  --  0.78  CALCIUM 9.0  --  9.3 9.2  --  8.4*  --  8.6*  MG  --   --  2.3  --  2.1 1.8 1.8  --   PHOS  --   --  2.7  --  2.8 1.8* 2.2*  --   ALKPHOS 109  --   --   --   --   --   --   --   AST 48*  --   --   --   --   --   --   --   ALT 30  --   --   --   --   --   --   --   ALBUMIN 3.7  --   --   --   --   --   --   --    MRI Brain 2/12  IMPRESSION: 1. No acute intracranial abnormality. 2. Asymmetric mesial left temporal lobe volume loss. 3. Mild chronic small vessel ischemic disease.  CT Head 2/9 FINDINGS: Brain: No evidence of acute infarction, hemorrhage, hydrocephalus, extra-axial collection or mass lesion/mass effect. Partially empty sella, incidental. Asymmetric volume loss in the medial left temporal lobe. No historical indication of memory loss to imply semantic dementia. Vascular: No hyperdense vessel. Skull: No acute or aggressive finding. Sinuses/Orbits: Nasopharyngeal fluid in the setting of intubation.Increased retro-orbital fat with proptosis. No acute finding. IMPRESSION: 1. No acute finding. 2. No definite seizure focus. Asymmetric medial left temporal lobe volume loss.  Results/Tests Pending at Time of Discharge: None  Discharge Medications:  Allergies as of 06/09/2017      Reactions   Chlorthalidone Rash   Per [facility] records, pt may have had a rash rxn to chlorthalidone      Medication List    STOP taking these medications   metoprolol tartrate 25 MG tablet Commonly known as:  LOPRESSOR     TAKE these medications   aspirin EC 81 MG tablet Take 81 mg by mouth daily.   donepezil 5 MG tablet Commonly known as:  ARICEPT Take 5 mg by mouth at bedtime.   insulin glargine 100 UNIT/ML injection Commonly known as:  LANTUS Inject 0.21 mLs (21 Units total) into the skin at bedtime.   levETIRAcetam 500 MG tablet Commonly known as:  KEPPRA Take 1 tablet (500 mg total) by mouth 2 (two) times daily.       Discharge  Instructions: Please refer to Patient Instructions section of EMR for full details.  Patient was counseled important signs and symptoms that should prompt return to medical care, changes in medications, dietary instructions, activity restrictions, and follow up appointments.   Follow-Up Appointments: Follow-up Information    Latrelle Dodrill, MD Follow up on 06/18/2017.   Specialty:  Family Medicine Why:  @ 9:00am Contact information: [address] [place] [address] [phone]        [facility] Follow up.   Specialty:  Home Health Services Why:  Home health services arranged, office will call and set up home visits Contact information: [address] [place] [address] [phone]           Ellwood Dense,  DO 06/10/2017, 2:21 PM PGY-1, [facility]

## 2017-06-18 ENCOUNTER — Inpatient Hospital Stay: Payer: Medicare Other | Admitting: Family Medicine

## 2017-06-25 ENCOUNTER — Ambulatory Visit (INDEPENDENT_AMBULATORY_CARE_PROVIDER_SITE_OTHER): Payer: Medicare Other | Admitting: Family Medicine

## 2017-06-25 ENCOUNTER — Encounter: Payer: Self-pay | Admitting: Family Medicine

## 2017-06-25 ENCOUNTER — Ambulatory Visit (HOSPITAL_COMMUNITY)
Admission: RE | Admit: 2017-06-25 | Discharge: 2017-06-25 | Disposition: A | Discharge status: Home / Self Care | Payer: Medicare Other | Source: Ambulatory Visit | Attending: Family | Admitting: Family

## 2017-06-25 ENCOUNTER — Other Ambulatory Visit: Payer: Self-pay

## 2017-06-25 VITALS — BP 154/82 | HR 80 | Temp 98.1°F | Ht 68.0 in | Wt 213.0 lb

## 2017-06-25 DIAGNOSIS — Z1211 Encounter for screening for malignant neoplasm of colon: Secondary | ICD-10-CM

## 2017-06-25 DIAGNOSIS — Z794 Long term (current) use of insulin: Secondary | ICD-10-CM

## 2017-06-25 DIAGNOSIS — I10 Essential (primary) hypertension: Secondary | ICD-10-CM | POA: Diagnosis not present

## 2017-06-25 DIAGNOSIS — R21 Rash and other nonspecific skin eruption: Secondary | ICD-10-CM

## 2017-06-25 DIAGNOSIS — Z1212 Encounter for screening for malignant neoplasm of rectum: Secondary | ICD-10-CM

## 2017-06-25 DIAGNOSIS — E119 Type 2 diabetes mellitus without complications: Secondary | ICD-10-CM

## 2017-06-25 DIAGNOSIS — M79604 Pain in right leg: Secondary | ICD-10-CM

## 2017-06-25 DIAGNOSIS — R569 Unspecified convulsions: Secondary | ICD-10-CM | POA: Diagnosis not present

## 2017-06-25 MED ORDER — TRIAMCINOLONE ACETONIDE 0.1 % EX CREA: 1.0000 "application " | TOPICAL_CREAM | Freq: Two times a day (BID) | CUTANEOUS | 1 refills | Status: DC | PRN

## 2017-06-25 MED ORDER — ONETOUCH DELICA LANCETS 33G MISC: 11 refills | Status: DC

## 2017-06-25 MED ORDER — ONETOUCH VERIO W/DEVICE KIT: 1.0000 | PACK | Freq: Every day | 0 refills | Status: DC

## 2017-06-25 MED ORDER — ONETOUCH DELICA LANCING DEV MISC: 0 refills | Status: DC

## 2017-06-25 MED ORDER — GLUCOSE BLOOD VI STRP: ORAL_STRIP | 11 refills | Status: DC

## 2017-06-25 NOTE — Patient Instructions (Signed)
Referring to neurology (Dr. Anne HahnWillis) to follow up on seizures. Stay on current medications  Sent in meter, strips, and lancets  Refilled triamcinolone cream  Ordered Cologuard - they will send stool collection supplies to your house.  Checking ultrasound of R leg to be sure no blood clot.  Be well, Dr. Pollie MeyerMcIntyre

## 2017-06-25 NOTE — Progress Notes (Signed)
  HPI:  Paula Reed presents for hospital follow up. Patient was hospitalized from 06/05/17 to 06/09/17 with new onset seizure.   Patient now reports she is doing well. Is accompanied by her husband who provides much of the history (patient with underlying dementia).  Seizures - no seizures since hospital discharge. Taking keppra 500mg  twice daily.  Does not have a neurology appointment yet. Husband would prefer she see Dr. Anne HahnWillis since he sees Dr. Anne HahnWillis himself.  R leg pain - notices it especially at night, since discharge from hospital. Located in posterior right calf. No swelling or shortness of breath.   Diabetes - was discharged on lantus 21 units at night. Not checking sugars as does not have a meter. Denies symptoms of hypoglycemia.  Home health physical therapy came to the house and worked with her but felt she was doing well enough to not need continued follow up with them.  Hypertension - on discharge from hospital was told to stop metoprolol 25mg  twice daily. No chest pain or shortness of breath. Husband worried about her blood pressure being elevated today and would like to restart metoprolol.   Rash - doing well. Needs refill of triamcinolone.  ROS: See HPI.  PMFSH: history of type 2 diabetes, seizure, hypertension, alzheimers, rash, hyperlipidemia  PHYSICAL EXAM: BP (!) 154/82   Pulse 80   Temp 98.1 F (36.7 C) (Oral)   Ht 5\' 8"  (1.727 m)   Wt 213 lb (96.6 kg)   SpO2 96%   BMI 32.39 kg/m  Gen: no acute distress, pleasant, cooperative HEENT: normocephalic, atraumatic, moist mucous membranes  Heart: regular rate and rhythm, no murmur Lungs: clear to auscultation bilaterally, normal work of breathing  Neuro: alert, grossly nonfocal, speech normal Ext: No appreciable lower extremity edema bilaterally. RLE with posterior calf tenderness and ?palpable cord vs prominent muscle. Negative homans bilaterally. No warmth or erythema. Skin: hyperpigmented areas on arms  consistent with areas of prior rash. No active erythematous lesions noted.  ASSESSMENT/PLAN:  Seizures (HCC) No seizures since discharge. Continue keppra 500mg  twice daily. Referral to neuro placed since patient does not yet have follow up visit scheduled with them.  Diabetes mellitus, type II Discharged from hospital on lantus, but does not have meter or testing supplies at home. Husband feels comfortable testing sugars without additional teaching. rx sent in. No signs/sx of hypoglycemia. Instructed to dose lantus in AM, test fasting sugar daily, and call in several weeks with sugar readings to adjust lantus. Normal foot exam today.  Rash and nonspecific skin eruption Doing well. Refill triamcinolone.  Hypertension Blood pressure up slightly today. Restart metoprolol 25mg  twice daily.   RLE pain - with pain, recent hospitalization and ?palpable cord, I think urgent RLE doppler is warranted to rule out DVT. Ordered & scheduled for this afternoon.  Health maintenance: - cologuard ordered - updated foot exam today  FOLLOW UP: Follow up in 3 mos for above issues  GrenadaBrittany J. Pollie MeyerMcIntyre, MD [facility]

## 2017-06-28 DIAGNOSIS — R569 Unspecified convulsions: Secondary | ICD-10-CM | POA: Insufficient documentation

## 2017-06-28 NOTE — Assessment & Plan Note (Signed)
Doing well. Refill triamcinolone.

## 2017-06-28 NOTE — Assessment & Plan Note (Addendum)
Discharged from hospital on lantus, but does not have meter or testing supplies at home. Husband feels comfortable testing sugars without additional teaching. rx sent in. No signs/sx of hypoglycemia. Instructed to dose lantus in AM, test fasting sugar daily, and call in several weeks with sugar readings to adjust lantus. Normal foot exam today.

## 2017-06-28 NOTE — Assessment & Plan Note (Signed)
Blood pressure up slightly today. Restart metoprolol 25mg  twice daily.

## 2017-06-28 NOTE — Assessment & Plan Note (Signed)
No seizures since discharge. Continue keppra 500mg  twice daily. Referral to neuro placed since patient does not yet have follow up visit scheduled with them.

## 2017-07-07 ENCOUNTER — Other Ambulatory Visit: Payer: Self-pay | Admitting: Family Medicine

## 2017-07-13 ENCOUNTER — Other Ambulatory Visit: Payer: Self-pay | Admitting: Family Medicine

## 2017-07-20 ENCOUNTER — Telehealth: Payer: Self-pay | Admitting: Family Medicine

## 2017-07-20 NOTE — Telephone Encounter (Signed)
Pt's husband came in office requesting a Rx for Insulin and needles for the pt, asking if MD can call him too. Last DOS 06-25-17. Best phone # to call is [phone].

## 2017-07-23 MED ORDER — "INSULIN SYRINGE 31G X 5/16"" 0.3 ML MISC": 5 refills | Status: DC

## 2017-07-23 NOTE — Telephone Encounter (Signed)
Called husband and spoke with him. He just needed syringes called in for administering lantus. Sent rx in. Husband appreciative.  Latrelle DodrillBrittany J Micah Barnier, MD

## 2017-07-26 ENCOUNTER — Telehealth: Payer: Self-pay | Admitting: Family Medicine

## 2017-07-26 MED ORDER — "INSULIN SYRINGE 31G X 5/16"" 0.3 ML MISC": 5 refills | Status: DC

## 2017-07-26 NOTE — Telephone Encounter (Signed)
Done. Please inform husband. Latrelle DodrillBrittany J McIntyre, MD

## 2017-07-26 NOTE — Telephone Encounter (Signed)
Patient's husband came by office stated the RX for the insulin syringes should have been called into [facility] on [address], not [employer]. Please let patient know when this is done. [phone]

## 2017-07-27 NOTE — Telephone Encounter (Signed)
Attempted to call pt husband no answer/voicemail set up. Will try again later. Deseree Bruna PotterBlount, CMA

## 2017-07-31 ENCOUNTER — Other Ambulatory Visit: Payer: Self-pay | Admitting: Family Medicine

## 2017-08-24 ENCOUNTER — Other Ambulatory Visit: Payer: Self-pay | Admitting: Family Medicine

## 2017-08-26 ENCOUNTER — Other Ambulatory Visit: Payer: Self-pay | Admitting: Family Medicine

## 2017-09-07 ENCOUNTER — Encounter: Payer: Self-pay | Admitting: Neurology

## 2017-09-07 ENCOUNTER — Ambulatory Visit (INDEPENDENT_AMBULATORY_CARE_PROVIDER_SITE_OTHER): Payer: Medicare Other | Admitting: Neurology

## 2017-09-07 VITALS — BP 186/78 | HR 56 | Ht 68.0 in | Wt 215.5 lb

## 2017-09-07 DIAGNOSIS — F0280 Dementia in other diseases classified elsewhere without behavioral disturbance: Secondary | ICD-10-CM | POA: Diagnosis not present

## 2017-09-07 DIAGNOSIS — R569 Unspecified convulsions: Secondary | ICD-10-CM

## 2017-09-07 DIAGNOSIS — G309 Alzheimer's disease, unspecified: Secondary | ICD-10-CM

## 2017-09-07 NOTE — Progress Notes (Signed)
Reason for visit: Seizures  Referring physician: [name] is a 72 y.o. female  History of present illness:  Paula Reed is a 72 year old right-handed black female with a history of diabetes, obesity, and dementia.  The patient has been living at home with her husband, she was admitted to the hospital on 05 June 2017.  The patient was noted to have onset of seizures early in the morning while in bed.  The patient is on insulin, her blood sugars have not been monitored at all.  The blood sugar on admission was 500, the hemoglobin A1c was 10.  The patient was in status epilepticus requiring an ICU admission.  The EEG study done did not show active seizures, MRI of the brain did not show acute brain changes.  The patient has been treated with Keppra, she has not had any recurring seizures.  She reports no focal numbness or weakness of the face, arms, legs.  She does not have any issues controlling the bowels or bladder, she denies any balance issues or problems with falling.  The patient sleeps fairly well, she does snore at night, she takes a nap during the daytime.  There is a history of a single seizure event that occurred when she was a child, but she was never treated for epilepsy growing up.  She is on Aricept, currently on a 5 mg daily dose.  She is sent to this office for an evaluation.  Past Medical History:  Diagnosis Date  . Allergic rhinitis   . Diabetes ([place])   . Diabetes mellitus without complication ([place])   . Hemorrhoids   . Hyperlipidemia   . Hypertension   . Probable Alzheimer's dementia without behavioral disturbance 09/15/2015   May 2017 [facility] score 8/30   . Seizures ([place])     Past Surgical History:  Procedure Laterality Date  . carpel tunnel release    . ECTOPIC PREGNANCY SURGERY      Family History  Problem Relation Age of Onset  . Heart disease Father   . Hypertension Father   . Hypertension Mother   . Colon cancer Unknown        aunt     Social history:  reports that she has quit smoking. She has never used smokeless tobacco. She reports that she does not drink alcohol or use drugs.  Medications:  Prior to Admission medications   Medication Sig Start Date End Date Taking? Authorizing Provider  aspirin 81 MG tablet Take 81 mg by mouth daily. Reported on 10/10/2015   Yes [provider]  donepezil (ARICEPT) 5 MG tablet TAKE 1 TABLET (5 MG TOTAL) AT BEDTIME BY MOUTH. 08/24/17  Yes Alveda Reasons, MD  glucose blood [facility] VERIO) test strip Check sugar twice daily 06/25/17  Yes Leeanne Rio, MD  Insulin Syringe-Needle U-100 (INSULIN SYRINGE .3CC/31GX5/16") 31G X 5/16" 0.3 ML MISC Use to inject lantus daily 07/26/17  Yes Leeanne Rio, MD  Lancet Devices (ONE TOUCH DELICA LANCING DEV) MISC Check sugar twice daily 06/25/17  Yes Leeanne Rio, MD  LANTUS 100 UNIT/ML injection INJECT 0.21 MLS (21 UNITS TOTAL) INTO THE SKIN AT BEDTIME. 07/14/17  Yes Leeanne Rio, MD  levETIRAcetam (KEPPRA) 500 MG tablet TAKE 1 TABLET BY MOUTH TWICE A DAY 08/27/17  Yes Alveda Reasons, MD  metoprolol tartrate (LOPRESSOR) 25 MG tablet TAKE 1 TABLET (25 MG TOTAL) BY MOUTH 2 (TWO) TIMES DAILY. 04/09/17  Yes Leeanne Rio,  MD  [facility] DELICA LANCETS 46N MISC Check sugar twice daily 06/25/17  Yes Leeanne Rio, MD  triamcinolone cream (KENALOG) 0.1 % Apply 1 application topically 2 (two) times daily as needed. 06/25/17  Yes Leeanne Rio, MD  aspirin EC 81 MG tablet Take 81 mg by mouth daily.    [provider]  Blood Glucose Monitoring Suppl (ONETOUCH VERIO) w/Device KIT 1 kit by Does not apply route daily. Check sugar twice daily 06/25/17   Leeanne Rio, MD      Allergies  Allergen Reactions  . Chlorthalidone Rash    Per [facility] records, pt may have had a rash rxn to chlorthalidone    ROS:  Out of a complete 14 system review of symptoms, the patient complains only of the  following symptoms, and all other reviewed systems are negative.  Skin rash, itching Snoring Allergies, runny nose Headache, seizure Memory disturbance  Blood pressure (!) 186/78, pulse (!) 56, height 5' 8"  (1.727 m), weight 215 lb 8 oz (97.8 kg).  Physical Exam  General: The patient is alert and cooperative at the time of the examination.  The patient is markedly obese.  Eyes: Pupils are equal, round, and reactive to light. Discs are flat bilaterally.  Neck: The neck is supple, no carotid bruits are noted.  Respiratory: The respiratory examination is clear.  Cardiovascular: The cardiovascular examination reveals a regular rate and rhythm, no obvious murmurs or rubs are noted.  Skin: Extremities are without significant edema.  Neurologic Exam  Mental status: The patient is alert and oriented x 3 at the time of the examination. The patient has apparent normal recent and remote memory, with an apparently normal attention span and concentration ability.  Cranial nerves: Facial symmetry is present. There is good sensation of the face to pinprick and soft touch bilaterally. The strength of the facial muscles and the muscles to head turning and shoulder shrug are normal bilaterally. Speech is well enunciated, no aphasia or dysarthria is noted. Extraocular movements are full. Visual fields are full. The tongue is midline, and the patient has symmetric elevation of the soft palate. No obvious hearing deficits are noted.  Motor: The motor testing reveals 5 over 5 strength of all 4 extremities. Good symmetric motor tone is noted throughout.  Sensory: Sensory testing is intact to pinprick, soft touch, vibration sensation, and position sense on all 4 extremities. No evidence of extinction is noted.  Coordination: Cerebellar testing reveals good finger-nose-finger and heel-to-shin bilaterally.  Some apraxia with use of the lower extremities is seen.  Gait and station: Gait is normal. Tandem  gait was not tested. Romberg is negative. No drift is seen.  Reflexes: Deep tendon reflexes are symmetric, but are depressed bilaterally. Toes are downgoing bilaterally.   MRI brain 06/08/17:  IMPRESSION: 1. No acute intracranial abnormality. 2. Asymmetric mesial left temporal lobe volume loss. 3. Mild chronic small vessel ischemic disease.    EEG 06/05/17:  EEG Abnormalities: 1) generalized irregular slow activity   Clinical Interpretation: This EEG is consistent with a generalized nonspecific cerebral dysfunction as can be seen in a sedated state.    Assessment/Plan:  1.  New onset seizure, status epilepticus  2.  Diabetes  3.  Memory disturbance  The patient is on low-dose Aricept, she probably should not be increased on a dose any higher than 5 mg.  The patient has not had any recurring seizures since coming out of the hospital in February 2019.  She remains on Keppra  500 mg twice daily, she is tolerating the medication well.  She is to continue this medication and follow-up in about 6 months.  They are to let me know if she has another seizure.  I have recommended that they monitor the blood glucose levels more closely, extremely high or low blood sugars may be a potential source of seizures.  Paula Alexanders MD 09/07/2017 2:07 PM  [facility] [address] [place], [place] [address]  Phone [phone] Fax [phone]

## 2017-09-18 ENCOUNTER — Other Ambulatory Visit: Payer: Self-pay | Admitting: Family Medicine

## 2017-09-29 ENCOUNTER — Other Ambulatory Visit: Payer: Self-pay | Admitting: Family Medicine

## 2017-10-24 ENCOUNTER — Other Ambulatory Visit: Payer: Self-pay | Admitting: Family Medicine

## 2017-11-16 ENCOUNTER — Ambulatory Visit (INDEPENDENT_AMBULATORY_CARE_PROVIDER_SITE_OTHER): Payer: Medicare Other | Admitting: Family Medicine

## 2017-11-16 VITALS — BP 140/70 | HR 55 | Temp 98.6°F | Wt 217.2 lb

## 2017-11-16 DIAGNOSIS — I10 Essential (primary) hypertension: Secondary | ICD-10-CM

## 2017-11-16 DIAGNOSIS — Z794 Long term (current) use of insulin: Secondary | ICD-10-CM | POA: Diagnosis not present

## 2017-11-16 DIAGNOSIS — E119 Type 2 diabetes mellitus without complications: Secondary | ICD-10-CM | POA: Diagnosis not present

## 2017-11-16 DIAGNOSIS — G309 Alzheimer's disease, unspecified: Secondary | ICD-10-CM

## 2017-11-16 DIAGNOSIS — R21 Rash and other nonspecific skin eruption: Secondary | ICD-10-CM | POA: Diagnosis not present

## 2017-11-16 DIAGNOSIS — F0280 Dementia in other diseases classified elsewhere without behavioral disturbance: Secondary | ICD-10-CM

## 2017-11-16 LAB — POCT GLYCOSYLATED HEMOGLOBIN (HGB A1C): HbA1c, POC (controlled diabetic range): 6.3 % (ref 0.0–7.0)

## 2017-11-16 LAB — GLUCOSE, POCT (MANUAL RESULT ENTRY): POC Glucose: 173 mg/dl — AB (ref 70–99)

## 2017-11-16 MED ORDER — INSULIN GLARGINE 100 UNIT/ML ~~LOC~~ SOLN: SUBCUTANEOUS | 2 refills | Status: DC

## 2017-11-16 MED ORDER — TRIAMCINOLONE ACETONIDE 0.1 % EX CREA: 1.0000 "application " | TOPICAL_CREAM | Freq: Two times a day (BID) | CUTANEOUS | 1 refills | Status: DC | PRN

## 2017-11-16 NOTE — Patient Instructions (Addendum)
Stay on current medications Decrease lantus to 18 units daily Follow up in 4 months, sooner if needed  Be well, Dr. Pollie MeyerMcIntyre

## 2017-11-16 NOTE — Progress Notes (Signed)
Date of Visit: 11/16/2017   HPI:   Patient presents for routine follow up.  Dementia - taking aricept 5mg  daily. Husband accompanies her to this visit. She is doing well overall. No safety concerns. No wandering or behavioral issues. Husband has noticed memory getting slightly worse over time. She is constantly accompanied either by her husband or sister in law.  Diabetes - taking insulin 21 units daily. Not able to check sugars at home as meter seems to not be working. No signs or symptoms of low blood sugars. No chest pain or shortness of breath. Husband would like her sugar checked today.  Hypertension - taking metoprolol 25mg  twice daily. Tolerating well.  Rash on arms - chronically stable. Needs triamcinolone refilled.  ROS: See HPI.  PMFSH: history of type 2 diabetes, hypertension, dementia  PHYSICAL EXAM: BP 140/70 (BP Location: Left Arm, Patient Position: Sitting, Cuff Size: Normal)   Pulse (!) 55   Temp 98.6 F (37 C) (Oral)   Wt 217 lb 3.2 oz (98.5 kg)   SpO2 99%   BMI 33.03 kg/m  Gen: no acute distress, pleasant, cooperative, well appearing HEENT: normocephalic, atraumatic moist mucous membranes  Heart: regular rate and rhythm Lungs: clear to auscultation bilaterally, normal work of breathing  Neuro: alert, grossly nonfocal, speech normal Ext: mild rash present on arms  ASSESSMENT/PLAN:  Health maintenance:  -due for colonoscopy, but given dementia will hold off on discussing at this time. Address at next visit.  Hypertension At goal, continue current medications.   Probable Alzheimer's dementia without behavioral disturbance Overall stable. Continue to monitor for safety/behavioral concerns.  Diabetes mellitus, type II A1c 6.3 today, too well controlled for patient with dementia. Decrease lantus to 18 units daily. Advised husband to [facility], alternatively he can touch  base with their retail pharmacist. Follow up in 4 months, sooner if needed.  Rash and nonspecific skin eruption Stable. Refill triamcinolone.  FOLLOW UP: Follow up in 4 mos for above issues.  [place]adaBrittany J. Pollie MeyerMcIntyre, MD [facility]

## 2017-11-18 ENCOUNTER — Ambulatory Visit: Payer: Medicare Other | Admitting: Pharmacist

## 2017-11-22 NOTE — Assessment & Plan Note (Signed)
Stable. Refill triamcinolone. 

## 2017-11-22 NOTE — Assessment & Plan Note (Signed)
Overall stable. Continue to monitor for safety/behavioral concerns.

## 2017-11-22 NOTE — Assessment & Plan Note (Signed)
A1c 6.3 today, too well controlled for patient with dementia. Decrease lantus to 18 units daily. Advised husband to schedule appointment in pharmacy [facility] for help with the meter, alternatively he can touch base with their retail pharmacist. Follow up in 4 months, sooner if needed.

## 2017-11-22 NOTE — Assessment & Plan Note (Signed)
At goal, continue current medications

## 2017-11-25 ENCOUNTER — Other Ambulatory Visit: Payer: Self-pay | Admitting: Family Medicine

## 2017-12-17 ENCOUNTER — Other Ambulatory Visit: Payer: Self-pay | Admitting: Family Medicine

## 2018-03-01 ENCOUNTER — Encounter: Payer: Self-pay | Admitting: Family Medicine

## 2018-03-01 ENCOUNTER — Other Ambulatory Visit: Payer: Self-pay

## 2018-03-01 ENCOUNTER — Ambulatory Visit (INDEPENDENT_AMBULATORY_CARE_PROVIDER_SITE_OTHER): Payer: Medicare Other | Admitting: Family Medicine

## 2018-03-01 VITALS — BP 130/68 | HR 72 | Temp 98.3°F | Ht 68.0 in | Wt 222.8 lb

## 2018-03-01 DIAGNOSIS — Z794 Long term (current) use of insulin: Secondary | ICD-10-CM

## 2018-03-01 DIAGNOSIS — R21 Rash and other nonspecific skin eruption: Secondary | ICD-10-CM

## 2018-03-01 DIAGNOSIS — E119 Type 2 diabetes mellitus without complications: Secondary | ICD-10-CM | POA: Diagnosis not present

## 2018-03-01 LAB — POCT SKIN KOH: Skin KOH, POC: NEGATIVE

## 2018-03-01 LAB — POCT GLYCOSYLATED HEMOGLOBIN (HGB A1C): HbA1c, POC (controlled diabetic range): 7.4 % — AB (ref 0.0–7.0)

## 2018-03-01 MED ORDER — TRIAMCINOLONE ACETONIDE 0.5 % EX OINT: 1.0000 "application " | TOPICAL_OINTMENT | Freq: Two times a day (BID) | CUTANEOUS | 2 refills | Status: DC

## 2018-03-01 NOTE — Assessment & Plan Note (Signed)
Pictured above. KOH with no hyphae. Will treat with moderate intensity steroid cream, triamcinolone 0.5%. If this does not improve, patient to return for possible biopsy.

## 2018-03-01 NOTE — Patient Instructions (Addendum)
Thank you for coming to see me today. It was a pleasure! Today we talked about:   Your rash on your back. It does not appear to be fungal. I suggest using the triamcinolone I sent to your pharmacy on the area. If it does not improve in a few weeks, then please come back, as we may need to do more of a work up.  Please follow-up with your regular doctor as needed.  If you have any questions or concerns, please do not hesitate to call the office at [phone].  Take Care,   [place] Dashawn Bartnick, DO

## 2018-03-01 NOTE — Assessment & Plan Note (Signed)
Continue current regimen with well-controlled A1c at 7.4 with goal <7.5.

## 2018-03-01 NOTE — Progress Notes (Signed)
  Subjective:    Patient ID: Paula Reed, female    DOB: Feb 16, 1946, 72 y.o.   MRN: [id number]   CC: Rash on back  HPI: Rash on back: Patient reports that she has had this rash on her back for many months.  Has reports that it is dark areas that are very itchy toward patient.  Patient reports that the rash does not hurt.  Patient denies any new medication changes.  Patient denies any recent fevers.  Patient not on any immunosuppressants.  Diabetes:  Last A1c 10.0 on 06/07/17 Taking medications:  Lantus 18 units On Aspirin Last foot exam: up to date ROS: denies dizziness, diaphoresis, LOC, polyuria, polydipsia   Smoking status reviewed  ROS: 10 point ROS is otherwise negative, except as mentioned in HPI  Patient Active Problem List   Diagnosis Date Noted  . Seizures (HCC) 06/28/2017  . Hyperglycemia   . Status epilepticus (HCC) 06/05/2017  . Probable Alzheimer's dementia without behavioral disturbance 09/15/2015  . Rash and nonspecific skin eruption 12/18/2014  . Rash 09/20/2014  . Bruit of right carotid artery 03/26/2014  . Back pain 07/06/2013  . Trigger finger 11/03/2012  . Heart murmur 04/22/2012  . Diabetes mellitus, type II (HCC) 02/02/2012  . Hypertension 02/02/2012  . Hyperlipidemia 02/02/2012  . Routine adult health maintenance 02/02/2012     Objective:  BP 130/68   Pulse 72   Temp 98.3 F (36.8 C) (Oral)   Ht 5\' 8"  (1.727 m)   Wt 222 lb 12.8 oz (101.1 kg)   SpO2 97%   BMI 33.88 kg/m  Vitals and nursing note reviewed  General: NAD, pleasant Respiratory: normal effort Extremities: no edema or cyanosis. WWP. Skin: warm, rash pictured below Neuro: alert and oriented, no focal deficits Psych: normal affect      KOH with no hyphae Assessment & Plan:   Rash and nonspecific skin eruption Pictured above. KOH with no hyphae. Will treat with moderate intensity steroid cream, triamcinolone 0.5%. If this does not improve, patient to return for  possible biopsy.   Diabetes mellitus, type II Continue current regimen with well-controlled A1c at 7.4 with goal <7.5.   [place] Geordie Nooney, DO Family Medicine Resident PGY-2

## 2018-03-16 NOTE — Progress Notes (Signed)
[facility]  PATIENT: Paula Reed DOB: 11/27/1945   REASON FOR VISIT: Follow-up for seizure disorder HISTORY FROM: Patient and husband   HISTORY OF PRESENT ILLNESS:UPDATE 11/21/2019CM Paula Reed, 72 year old female returns for follow-up with history of dementia obesity diabetes and new onset seizure disorder after hospital admission in Feb 2019  blood sugar greater than 500. She is currently on Keppra 500 mg twice daily without further seizure events.  Most recent hemoglobin A1c this month was 7.4 she denies any numbness or weakness, no issues controlling bowel or bladder, no falls she is currently on Aricept 5 mg for dementia.  She returns for reevaluation   5/14/19KWMs. Reed is a 72 year old right-handed black female with a history of diabetes, obesity, and dementia.  The patient has been living at home with her husband, she was admitted to the hospital on 05 June 2017.  The patient was noted to have onset of seizures early in the morning while in bed.  The patient is on insulin, her blood sugars have not been monitored at all.  The blood sugar on admission was 500, the hemoglobin A1c was 10.  The patient was in status epilepticus requiring an ICU admission.  The EEG study done did not show active seizures, MRI of the brain did not show acute brain changes.  The patient has been treated with Keppra, she has not had any recurring seizures.  She reports no focal numbness or weakness of the face, arms, legs.  She does not have any issues controlling the bowels or bladder, she denies any balance issues or problems with falling.  The patient sleeps fairly well, she does snore at night, she takes a nap during the daytime.  There is a history of a single seizure event that occurred when she was a child, but she was never treated for epilepsy growing up.  She is on Aricept, currently on a 5 mg daily dose.  She is sent to this office for an evaluation.   REVIEW OF  SYSTEMS: Full 14 system review of systems performed and notable only for those listed, all others are neg:  Constitutional: neg  Cardiovascular: neg Ear/Nose/Throat: neg  Skin: neg Eyes: neg Respiratory: neg Gastroitestinal: neg  Hematology/Lymphatic: neg  Endocrine: neg Musculoskeletal:neg Allergy/Immunology: neg Neurological: Seizure disorder, history of dementia Psychiatric: neg Sleep : neg   ALLERGIES: Allergies  Allergen Reactions  . Chlorthalidone Rash    Per [facility] records, pt may have had a rash rxn to chlorthalidone    HOME MEDICATIONS: Outpatient Medications Prior to Visit  Medication Sig Dispense Refill  . aspirin 81 MG tablet Take 81 mg by mouth daily. Reported on 10/10/2015    . Blood Glucose Monitoring Suppl (ONETOUCH VERIO) w/Device KIT 1 kit by Does not apply route daily. Check sugar twice daily 1 kit 0  . donepezil (ARICEPT) 5 MG tablet TAKE 1 TABLET (5 MG TOTAL) AT BEDTIME BY MOUTH. 30 tablet 5  . glucose blood (ONETOUCH VERIO) test strip Check sugar twice daily 200 each 11  . insulin glargine (LANTUS) 100 UNIT/ML injection INJECT 0.18 MLS (18 UNITS TOTAL) INTO THE SKIN AT BEDTIME. 10 mL 2  . Insulin Syringe-Needle U-100 (INSULIN SYRINGE .3CC/31GX5/16") 31G X 5/16" 0.3 ML MISC Use to inject lantus daily 100 each 5  . Lancet Devices (ONE TOUCH DELICA LANCING DEV) MISC Check sugar twice daily 1 each 0  . levETIRAcetam (KEPPRA) 500 MG tablet TAKE 1 TABLET BY MOUTH TWICE A DAY 180 tablet 1  .  metoprolol tartrate (LOPRESSOR) 25 MG tablet TAKE 1 TABLET (25 MG TOTAL) BY MOUTH 2 (TWO) TIMES DAILY. 180 tablet 1  . ONETOUCH DELICA LANCETS 00T MISC Check sugar twice daily 200 each 11  . triamcinolone cream (KENALOG) 0.1 % Apply 1 application topically 2 (two) times daily as needed. 80 g 1  . triamcinolone ointment (KENALOG) 0.5 % Apply 1 application topically 2 (two) times daily. Apply to area on back. 30 g 2   No facility-administered medications prior to visit.       PAST MEDICAL HISTORY: Past Medical History:  Diagnosis Date  . Allergic rhinitis   . Diabetes ([place])   . Diabetes mellitus without complication ([place])   . Hemorrhoids   . Hyperlipidemia   . Hypertension   . Probable Alzheimer's dementia without behavioral disturbance 09/15/2015   May 2017 [facility] score 8/30   . Seizures ([place])     PAST SURGICAL HISTORY: Past Surgical History:  Procedure Laterality Date  . carpel tunnel release    . ECTOPIC PREGNANCY SURGERY      FAMILY HISTORY: Family History  Problem Relation Age of Onset  . Heart disease Father   . Hypertension Father   . Hypertension Mother   . Colon cancer Unknown        aunt    SOCIAL HISTORY: Social History   Socioeconomic History  . Marital status: Married    Spouse name: Eduard Clos  . Number of children: 5  . Years of education: 69  . Highest education level: Not on file  Occupational History  . Occupation: Retired- Scientist, clinical (histocompatibility and immunogenetics): UNEMPLOYED  Social Needs  . Financial resource strain: Not on file  . Food insecurity:    Worry: Not on file    Inability: Not on file  . Transportation needs:    Medical: Not on file    Non-medical: Not on file  Tobacco Use  . Smoking status: Former Research scientist (life sciences)  . Smokeless tobacco: Never Used  Substance and Sexual Activity  . Alcohol use: No    Frequency: Never  . Drug use: No  . Sexual activity: Not on file  Lifestyle  . Physical activity:    Days per week: Not on file    Minutes per session: Not on file  . Stress: Not on file  Relationships  . Social connections:    Talks on phone: Not on file    Gets together: Not on file    Attends religious service: Not on file    Active member of club or organization: Not on file    Attends meetings of clubs or organizations: Not on file    Relationship status: Not on file  . Intimate partner violence:    Fear of current or ex partner: Not on file    Emotionally abused: Not on file    Physically abused: Not on file     Forced sexual activity: Not on file  Other Topics Concern  . Not on file  Social History Narrative   Merged History Encounter        Feels safe in relationship. Negative depression screen on 02/02/12.       Health Care POA:    Emergency Contact: husband, Eduard Clos [phone]   End of Life Plan: gave AD information 4/15   Who lives with you: husband and 3 foster girls ages 53, 91, 66(07/2013)   Any pets: none   Diet: Pt has a varied diet of protein, starch and vegetables.   Exercise:  Pt does not have regular exercise routine.    Seatbelts: Pt reports wearing seatbelt when in vehicles.    Hobbies: walking, eating out   Caffeine use: 1 cup coffee every morning   Diet-cola sometimes   Right handed     PHYSICAL EXAM  Vitals:   03/17/18 1420  BP: (!) 173/84  Pulse: 61  Weight: 223 lb 3.2 oz (101.2 kg)  Height: _0  (1.727 m)   Body mass index is 33.94 kg/m.  Generalized: Well developed, obese female in no acute distress  Head: normocephalic and atraumatic,. Oropharynx benign  Neck: Supple, no carotid bruits  Cardiac: Regular rate rhythm, no murmur  Musculoskeletal: No deformity   Neurological examination   Mentation: Alert oriented to time, place, history taking. Attention span and concentration appropriate. Follows all commands speech and language fluent.   Cranial nerve II-XII: Pupils were equal round reactive to light extraocular movements were full, visual field were full on confrontational test. Facial sensation and strength were normal. hearing was intact to finger rubbing bilaterally. Uvula tongue midline. head turning and shoulder shrug were normal and symmetric.Tongue protrusion into cheek strength was normal. Motor: normal bulk and tone, full strength in the BUE, BLE, Sensory: normal and symmetric to light touch, pinprick, and  Vibration, in the upper and lower extremities Coordination: finger-nose-finger, heel-to-shin bilaterally, some apraxia with the use of her lower  extremities  Reflexes: Symmetric but depressed bilaterally, plantar responses were flexor bilaterally. Gait and Station: Rising up from seated position without assistance, normal stance,  moderate stride, good arm swing, smooth turning, able to perform tiptoe, and heel walking without difficulty. Tandem gait is mildly unsteady.  Romberg is negative  DIAGNOSTIC DATA (LABS, IMAGING, TESTING) - I reviewed patient records, labs, notes, testing and imaging myself where available.  Lab Results  Component Value Date   WBC 8.7 06/09/2017   HGB 12.2 06/09/2017   HCT 38.3 06/09/2017   MCV 90.3 06/09/2017   PLT 264 06/09/2017      Component Value Date/Time   NA 139 06/08/2017 0355   NA 134 06/11/2016 1611   K 4.0 06/08/2017 0355   CL 106 06/08/2017 0355   CO2 17 (L) 06/08/2017 0355   GLUCOSE 163 (H) 06/08/2017 0355   BUN 7 06/08/2017 0355   BUN 13 06/11/2016 1611   CREATININE 0.78 06/08/2017 0355   CREATININE 0.62 09/12/2015 1025   CALCIUM 8.6 (L) 06/08/2017 0355   PROT 7.7 06/05/2017 0745   PROT 7.5 06/04/2016 1125   ALBUMIN 3.7 06/05/2017 0745   ALBUMIN 4.1 06/04/2016 1125   AST 48 (H) 06/05/2017 0745   ALT 30 06/05/2017 0745   ALKPHOS 109 06/05/2017 0745   BILITOT 0.8 06/05/2017 0745   BILITOT 0.7 06/04/2016 1125   GFRNONAA >60 06/08/2017 0355   GFRNONAA >89 09/12/2015 1025   GFRAA >60 06/08/2017 0355   GFRAA >89 09/12/2015 1025   Lab Results  Component Value Date   CHOL 127 06/04/2016   HDL 45 06/04/2016   LDLCALC 62 06/04/2016   TRIG 99 06/04/2016   CHOLHDL 2.8 06/04/2016   Lab Results  Component Value Date   HGBA1C 7.4 (A) 03/01/2018   Lab Results  Component Value Date   VITAMINB12 285 09/12/2015   Lab Results  Component Value Date   TSH 1.89 09/12/2015      ASSESSMENT AND PLAN  72 y.o. year old female with new onset seizure disorder, diabetes and memory disturbance.  The patient is on low-dose Aricept which should  not be increased due to her seizure  disorder.  Most recent hemoglobin A1c 7.4.  No further tseizure activity now on Keppra 500 twice daily tolerating without side effects.    Continue Keppra 500 mg twice daily Call for any seizure activity Hemoglobin A1c improved at 7.4 F/U 6 to 8 months Dennie Bible, [facility], [facility], APRN  [facility] [address], [place], [place] [address] [phone]

## 2018-03-17 ENCOUNTER — Encounter: Payer: Self-pay | Admitting: Nurse Practitioner

## 2018-03-17 ENCOUNTER — Ambulatory Visit (INDEPENDENT_AMBULATORY_CARE_PROVIDER_SITE_OTHER): Payer: Medicare Other | Admitting: Nurse Practitioner

## 2018-03-17 VITALS — BP 173/84 | HR 61 | Ht 68.0 in | Wt 223.2 lb

## 2018-03-17 DIAGNOSIS — R569 Unspecified convulsions: Secondary | ICD-10-CM | POA: Diagnosis not present

## 2018-03-17 NOTE — Progress Notes (Signed)
I have read the note, and I agree with the clinical assessment and plan.  Paula Reed   

## 2018-03-17 NOTE — Patient Instructions (Addendum)
Continue Keppra 500 mg twice daily Call for any seizure activity Hemoglobin A1c improved at 7.4 F/U 6 to 8 months

## 2018-04-02 ENCOUNTER — Other Ambulatory Visit: Payer: Self-pay | Admitting: Family Medicine

## 2018-04-09 ENCOUNTER — Other Ambulatory Visit: Payer: Self-pay | Admitting: Family Medicine

## 2018-04-12 ENCOUNTER — Other Ambulatory Visit: Payer: Self-pay | Admitting: Family Medicine

## 2018-05-10 ENCOUNTER — Other Ambulatory Visit: Payer: Self-pay

## 2018-05-10 ENCOUNTER — Ambulatory Visit
Admission: RE | Admit: 2018-05-10 | Discharge: 2018-05-10 | Disposition: A | Discharge status: Home / Self Care | Payer: Medicare Other | Source: Ambulatory Visit | Attending: Family Medicine | Admitting: Family Medicine

## 2018-05-10 ENCOUNTER — Encounter: Payer: Self-pay | Admitting: Family Medicine

## 2018-05-10 ENCOUNTER — Ambulatory Visit (INDEPENDENT_AMBULATORY_CARE_PROVIDER_SITE_OTHER): Payer: Medicare Other | Admitting: Family Medicine

## 2018-05-10 VITALS — BP 134/78 | HR 87 | Temp 97.9°F | Ht 68.0 in | Wt 226.2 lb

## 2018-05-10 DIAGNOSIS — E119 Type 2 diabetes mellitus without complications: Secondary | ICD-10-CM | POA: Diagnosis not present

## 2018-05-10 DIAGNOSIS — M79645 Pain in left finger(s): Secondary | ICD-10-CM | POA: Diagnosis not present

## 2018-05-10 DIAGNOSIS — J309 Allergic rhinitis, unspecified: Secondary | ICD-10-CM

## 2018-05-10 DIAGNOSIS — R21 Rash and other nonspecific skin eruption: Secondary | ICD-10-CM | POA: Diagnosis not present

## 2018-05-10 DIAGNOSIS — Z794 Long term (current) use of insulin: Secondary | ICD-10-CM

## 2018-05-10 MED ORDER — CETIRIZINE HCL 5 MG PO TABS: 5.0000 mg | ORAL_TABLET | Freq: Every day | ORAL | 1 refills | Status: DC

## 2018-05-10 NOTE — Progress Notes (Signed)
Date of Visit: 05/10/2018   HPI:  Patient presents for routine follow up. She is accompanied by her husband, who provides the history as patient has dementia.  Diabetes - currently taking lantus 18 units at bedtime. Not checking sugars as husband still does not know how to work meter. He is agreeable to scheduling in pharmacy clinic for teaching. Does not have meter with him today. Husband has noticed she's gained weight. Not as active, sleeps more often.  Rash on back - healed with triamcinolone. Has some hyperpigmentation afterward as a result.  L middle finger - has had swelling and pain in the middle phalanx of the left third finger for over 1 month. Does not recall specifically injuring it.   Cough -has had cough for about a month. No fevers. Notices cough mostly when she's hot. Not related to eating. No wheezing, shortness of breath, or chest pain. Does have a lot of nasal congestion and drainage.  ROS: See HPI.  PMFSH: history of type 2 diabetes, hypertension, alzheimers, hyperlipidemia, seizures  PHYSICAL EXAM: BP 134/78   Pulse 87   Temp 97.9 F (36.6 C) (Oral)   Ht 5\' 8"  (1.727 m)   Wt 226 lb 3.2 oz (102.6 kg)   SpO2 97%   BMI 34.39 kg/m  Gen: no acute distress, pleasant, cooperative HEENT: normocephalic, atraumatic, moist mucous membranes  Heart: regular rate and rhythm, no murmur Lungs: clear to auscultation bilaterally, normal work of breathing  Neuro: alert, speech normal Ext: No appreciable lower extremity edema bilaterally. L third digit with no appreciable swelling, warmth or erythema but is tender over the middle phalanx. Limited ROM due to pain.  ASSESSMENT/PLAN:  Health maintenance:  -will request records for eye exam  Diabetes mellitus, type II Schedule in pharmacy clinic for meter teaching Follow up in 1 month for A1c Advised husband weight gain likely related to decreased activity with dementia  Allergic rhinitis Suspect chronic cough is related to  allergies given concurrent nasal congestion/drainage. Start zyrtec 5mg  daily.  Rash and nonspecific skin eruption Rash improved after triamcinolone.  Finger pain - unclear etiology. Check xray to eval for possible fracture or bony lesion.  FOLLOW UP: Follow up in 1 mo for A1c  [place] J. Pollie Meyer, MD [facility]

## 2018-05-10 NOTE — Patient Instructions (Addendum)
Go get xray of finger  Schedule appointment in our pharmacy clinic for diabetic meter teaching  Start zyrtec for cough - sent this in for you  Follow up in 1 month with me for next A1c  Be well, Dr. Pollie Meyer

## 2018-05-12 DIAGNOSIS — J309 Allergic rhinitis, unspecified: Secondary | ICD-10-CM | POA: Insufficient documentation

## 2018-05-12 NOTE — Assessment & Plan Note (Signed)
Suspect chronic cough is related to allergies given concurrent nasal congestion/drainage. Start zyrtec 5mg  daily.

## 2018-05-12 NOTE — Assessment & Plan Note (Addendum)
Schedule in pharmacy clinic for meter teaching Follow up in 1 month for A1c Advised husband weight gain likely related to decreased activity with dementia

## 2018-05-12 NOTE — Assessment & Plan Note (Signed)
Rash improved after triamcinolone.

## 2018-05-16 ENCOUNTER — Telehealth: Payer: Self-pay | Admitting: Family Medicine

## 2018-05-16 NOTE — Telephone Encounter (Signed)
Called patient's husband to discuss finger xray result. Showed small possible tiny erosion in proximal middle phalanx of finger. Will do trial of finger splint (over the counter) for 2 weeks to rest the finger. If still hurting after 2 weeks patient will follow up. Husband appreciative and agreeable to this plan.  Latrelle DodrillBrittany J Shameeka Silliman, MD

## 2018-05-31 ENCOUNTER — Emergency Department (HOSPITAL_COMMUNITY): Payer: Medicare Other

## 2018-05-31 ENCOUNTER — Inpatient Hospital Stay (HOSPITAL_COMMUNITY)
Admission: EM | Admit: 2018-05-31 | Discharge: 2018-06-08 | DRG: 871 | Disposition: A | Discharge status: Skilled Nursing Facility | Payer: Medicare Other | Attending: Internal Medicine | Admitting: Internal Medicine

## 2018-05-31 ENCOUNTER — Encounter (HOSPITAL_COMMUNITY): Payer: Self-pay | Admitting: Emergency Medicine

## 2018-05-31 DIAGNOSIS — Z79899 Other long term (current) drug therapy: Secondary | ICD-10-CM

## 2018-05-31 DIAGNOSIS — G40909 Epilepsy, unspecified, not intractable, without status epilepticus: Secondary | ICD-10-CM | POA: Diagnosis present

## 2018-05-31 DIAGNOSIS — G309 Alzheimer's disease, unspecified: Secondary | ICD-10-CM | POA: Diagnosis present

## 2018-05-31 DIAGNOSIS — N390 Urinary tract infection, site not specified: Secondary | ICD-10-CM | POA: Diagnosis present

## 2018-05-31 DIAGNOSIS — I10 Essential (primary) hypertension: Secondary | ICD-10-CM | POA: Diagnosis present

## 2018-05-31 DIAGNOSIS — Y92009 Unspecified place in unspecified non-institutional (private) residence as the place of occurrence of the external cause: Secondary | ICD-10-CM

## 2018-05-31 DIAGNOSIS — R7881 Bacteremia: Secondary | ICD-10-CM

## 2018-05-31 DIAGNOSIS — F0280 Dementia in other diseases classified elsewhere without behavioral disturbance: Secondary | ICD-10-CM | POA: Diagnosis present

## 2018-05-31 DIAGNOSIS — Z800 Family history of malignant neoplasm of digestive organs: Secondary | ICD-10-CM

## 2018-05-31 DIAGNOSIS — N3001 Acute cystitis with hematuria: Secondary | ICD-10-CM

## 2018-05-31 DIAGNOSIS — R569 Unspecified convulsions: Secondary | ICD-10-CM

## 2018-05-31 DIAGNOSIS — S8001XA Contusion of right knee, initial encounter: Secondary | ICD-10-CM | POA: Diagnosis present

## 2018-05-31 DIAGNOSIS — E119 Type 2 diabetes mellitus without complications: Secondary | ICD-10-CM

## 2018-05-31 DIAGNOSIS — Z87891 Personal history of nicotine dependence: Secondary | ICD-10-CM

## 2018-05-31 DIAGNOSIS — R29702 NIHSS score 2: Secondary | ICD-10-CM | POA: Diagnosis present

## 2018-05-31 DIAGNOSIS — R9431 Abnormal electrocardiogram [ECG] [EKG]: Secondary | ICD-10-CM | POA: Diagnosis present

## 2018-05-31 DIAGNOSIS — E785 Hyperlipidemia, unspecified: Secondary | ICD-10-CM | POA: Diagnosis present

## 2018-05-31 DIAGNOSIS — R296 Repeated falls: Secondary | ICD-10-CM | POA: Diagnosis present

## 2018-05-31 DIAGNOSIS — E1151 Type 2 diabetes mellitus with diabetic peripheral angiopathy without gangrene: Secondary | ICD-10-CM | POA: Diagnosis present

## 2018-05-31 DIAGNOSIS — I6522 Occlusion and stenosis of left carotid artery: Secondary | ICD-10-CM | POA: Diagnosis present

## 2018-05-31 DIAGNOSIS — Z8249 Family history of ischemic heart disease and other diseases of the circulatory system: Secondary | ICD-10-CM

## 2018-05-31 DIAGNOSIS — A4102 Sepsis due to Methicillin resistant Staphylococcus aureus: Principal | ICD-10-CM | POA: Diagnosis present

## 2018-05-31 DIAGNOSIS — I639 Cerebral infarction, unspecified: Secondary | ICD-10-CM | POA: Diagnosis present

## 2018-05-31 DIAGNOSIS — R531 Weakness: Secondary | ICD-10-CM | POA: Diagnosis not present

## 2018-05-31 DIAGNOSIS — Z794 Long term (current) use of insulin: Secondary | ICD-10-CM

## 2018-05-31 DIAGNOSIS — R0902 Hypoxemia: Secondary | ICD-10-CM | POA: Diagnosis present

## 2018-05-31 DIAGNOSIS — W19XXXA Unspecified fall, initial encounter: Secondary | ICD-10-CM | POA: Diagnosis present

## 2018-05-31 DIAGNOSIS — J101 Influenza due to other identified influenza virus with other respiratory manifestations: Secondary | ICD-10-CM | POA: Diagnosis present

## 2018-05-31 DIAGNOSIS — A419 Sepsis, unspecified organism: Secondary | ICD-10-CM

## 2018-05-31 DIAGNOSIS — Z888 Allergy status to other drugs, medicaments and biological substances status: Secondary | ICD-10-CM

## 2018-05-31 DIAGNOSIS — S8002XA Contusion of left knee, initial encounter: Secondary | ICD-10-CM | POA: Diagnosis present

## 2018-05-31 DIAGNOSIS — B9562 Methicillin resistant Staphylococcus aureus infection as the cause of diseases classified elsewhere: Secondary | ICD-10-CM | POA: Diagnosis present

## 2018-05-31 DIAGNOSIS — Z7982 Long term (current) use of aspirin: Secondary | ICD-10-CM

## 2018-05-31 LAB — COMPREHENSIVE METABOLIC PANEL
ALT: 22 U/L (ref 0–44)
AST: 41 U/L (ref 15–41)
Albumin: 4 g/dL (ref 3.5–5.0)
Alkaline Phosphatase: 81 U/L (ref 38–126)
Anion gap: 11 (ref 5–15)
BUN: 15 mg/dL (ref 8–23)
CO2: 22 mmol/L (ref 22–32)
Calcium: 9.1 mg/dL (ref 8.9–10.3)
Chloride: 100 mmol/L (ref 98–111)
Creatinine, Ser: 1.17 mg/dL — ABNORMAL HIGH (ref 0.44–1.00)
GFR calc Af Amer: 54 mL/min — ABNORMAL LOW (ref >60)
GFR calc non Af Amer: 47 mL/min — ABNORMAL LOW (ref >60)
Glucose, Bld: 273 mg/dL — ABNORMAL HIGH (ref 70–99)
Potassium: 3.5 mmol/L (ref 3.5–5.1)
Sodium: 133 mmol/L — ABNORMAL LOW (ref 135–145)
Total Bilirubin: 1.1 mg/dL (ref 0.3–1.2)
Total Protein: 8 g/dL (ref 6.5–8.1)

## 2018-05-31 LAB — CBC WITH DIFFERENTIAL/PLATELET
Abs Immature Granulocytes: 0.13 10*3/uL — ABNORMAL HIGH (ref 0.00–0.07)
Basophils Absolute: 0 10*3/uL (ref 0.0–0.1)
Basophils Relative: 0 %
Eosinophils Absolute: 0.1 10*3/uL (ref 0.0–0.5)
Eosinophils Relative: 1 %
HCT: 41.1 % (ref 36.0–46.0)
Hemoglobin: 12.9 g/dL (ref 12.0–15.0)
Immature Granulocytes: 1 %
Lymphocytes Relative: 4 %
Lymphs Abs: 0.5 10*3/uL — ABNORMAL LOW (ref 0.7–4.0)
MCH: 28.1 pg (ref 26.0–34.0)
MCHC: 31.4 g/dL (ref 30.0–36.0)
MCV: 89.5 fL (ref 80.0–100.0)
Monocytes Absolute: 1 10*3/uL (ref 0.1–1.0)
Monocytes Relative: 8 %
Neutro Abs: 11.2 10*3/uL — ABNORMAL HIGH (ref 1.7–7.7)
Neutrophils Relative %: 86 %
Platelets: 268 10*3/uL (ref 150–400)
RBC: 4.59 MIL/uL (ref 3.87–5.11)
RDW: 13 % (ref 11.5–15.5)
WBC: 13 10*3/uL — ABNORMAL HIGH (ref 4.0–10.5)
nRBC: 0 % (ref 0.0–0.2)

## 2018-05-31 LAB — URINALYSIS, ROUTINE W REFLEX MICROSCOPIC
Bilirubin Urine: NEGATIVE
Glucose, UA: 50 mg/dL — AB
Ketones, ur: 5 mg/dL — AB
Leukocytes, UA: NEGATIVE
Nitrite: NEGATIVE
Protein, ur: >=300 mg/dL — AB
Specific Gravity, Urine: 1.025 (ref 1.005–1.030)
pH: 5 (ref 5.0–8.0)

## 2018-05-31 LAB — LACTIC ACID, PLASMA
Lactic Acid, Venous: 2.1 mmol/L (ref 0.5–1.9)
Lactic Acid, Venous: 1.8 mmol/L (ref 0.5–1.9)

## 2018-05-31 LAB — TROPONIN I: Troponin I: 0.03 ng/mL (ref <0.03)

## 2018-05-31 MED ORDER — SODIUM CHLORIDE 0.9 % IV SOLN
1.0000 g | Freq: Once | INTRAVENOUS | Status: AC
  Administered 2018-05-31: 1 g via INTRAVENOUS
  Filled 2018-05-31: qty 10

## 2018-05-31 MED ORDER — SODIUM CHLORIDE 0.9 % IV BOLUS
1000.0000 mL | Freq: Once | INTRAVENOUS | Status: AC
  Administered 2018-06-01: 1000 mL via INTRAVENOUS

## 2018-05-31 MED ORDER — SODIUM CHLORIDE 0.9 % IV SOLN
500.0000 mg | Freq: Once | INTRAVENOUS | Status: AC
  Administered 2018-06-01: 500 mg via INTRAVENOUS
  Filled 2018-05-31: qty 500

## 2018-05-31 MED ORDER — SODIUM CHLORIDE 0.9 % IV BOLUS
1000.0000 mL | Freq: Once | INTRAVENOUS | Status: AC
  Administered 2018-05-31: 1000 mL via INTRAVENOUS

## 2018-05-31 NOTE — ED Notes (Signed)
Date and time results received: 05/31/18 2233 (use smartphrase ".now" to insert current time)  Test:  Lactic acid  Critical Value: 2.1  Name of Provider Notified: Mesner  Orders Received? Or Actions Taken?: Provider made aware

## 2018-05-31 NOTE — ED Notes (Signed)
Date and time results received: 05/31/18 2232 (use smartphrase ".now" to insert current time)  Test: Troponin Critical Value: 0.03  Name of Provider Notified: Mesner  Orders Received? Or Actions Taken?: MD made aware

## 2018-05-31 NOTE — ED Provider Notes (Signed)
Emergency Department Provider Note   I have reviewed the triage vital signs and the nursing notes.   HISTORY  Chief Complaint Fall and Weakness   HPI Paula Reed is a 73 y.o. female with multiple medical problems as documented below the presents to the emergency department today secondary to weakness.  Initially she showed up with EMS and subsequently her husband showed up as well.  They state that the patient's been abnormally weak today with increased urination.  She has had a slight decrease in her mental status but generally speaking is at her demented self.  She had a couple falls secondary to the weakness but no focal weakness anywhere that he is noticed.  Skin felt hot but no fevers that he knows of.  400 cc with EMS.    Level 5 caveat secondary to dementia. Past Medical History:  Diagnosis Date  . Allergic rhinitis   . Diabetes (HCC)   . Diabetes mellitus without complication (HCC)   . Hemorrhoids   . Hyperlipidemia   . Hypertension   . Probable Alzheimer's dementia without behavioral disturbance 09/15/2015   May 2017 [facility] score 8/30   . Seizures [facility])     Patient Active Problem List   Diagnosis Date Noted  . Generalized weakness 06/01/2018  . Allergic rhinitis 05/12/2018  . Seizures (HCC) 06/28/2017  . Hyperglycemia   . Status epilepticus (HCC) 06/05/2017  . Probable Alzheimer's dementia without behavioral disturbance 09/15/2015  . Rash and nonspecific skin eruption 12/18/2014  . Rash 09/20/2014  . Bruit of right carotid artery 03/26/2014  . Back pain 07/06/2013  . Trigger finger 11/03/2012  . Heart murmur 04/22/2012  . Diabetes mellitus, type II (HCC) 02/02/2012  . Hypertension 02/02/2012  . Hyperlipidemia 02/02/2012  . Routine adult health maintenance 02/02/2012    Past Surgical History:  Procedure Laterality Date  . carpel tunnel release    . ECTOPIC PREGNANCY SURGERY      Current Outpatient Rx  . Order #: [id number] Class: Historical Med  .  Order #: [id number] Class: Historical Med  . Order #: [id number] Class: Normal  . Order #: [id number] Class: Normal  . Order #: [id number] Class: Normal  . Order #: [id number] Class: Normal  . Order #: [id number] Class: Normal  . Order #: [id number] Class: Normal  . Order #: [id number] Class: Normal  . Order #: [id number] Class: Normal  . Order #: [id number] Class: Normal  . Order #: [id number] Class: Normal  . Order #: [id number] Class: Normal  . Order #: [id number] Class: Normal    Allergies Chlorthalidone  Family History  Problem Relation Age of Onset  . Heart disease Father   . Hypertension Father   . Hypertension Mother   . Colon cancer Other        aunt    Social History Social History   Tobacco Use  . Smoking status: Former Games developer  . Smokeless tobacco: Never Used  Substance Use Topics  . Alcohol use: No    Frequency: Never  . Drug use: No    Review of Systems  Level V Caveat Secondary to Dementia ____________________________________________   PHYSICAL EXAM:  VITAL SIGNS: ED Triage Vitals  Enc Vitals Group     BP 05/31/18 2051 (!) 147/66     Pulse Rate 05/31/18 2051 (!) 103     Resp 05/31/18 2051 16     Temp 05/31/18 2051 99.2 F (37.3 C)     Temp Source 05/31/18 2051 Oral     SpO2 05/31/18 2039 95 %    Constitutional: Alert and  oriented. Well appearing and in no acute distress. Eyes: Conjunctivae are normal. PERRL. EOMI. Head: Atraumatic. Nose: No congestion/rhinnorhea. Mouth/Throat: Mucous membranes are dry.  Oropharynx non-erythematous. Neck: No stridor.  No meningeal signs.   Cardiovascular: tachycardi rate, regular rhythm. Good peripheral circulation. Grossly normal heart sounds.   Respiratory: tachypneic respiratory effort.  No retractions. Lungs CTAB. Mild hypoxia - 91% on RA at rest Gastrointestinal: Soft and nontender. No distention.  Musculoskeletal: No lower extremity tenderness nor edema. No gross deformities of extremities. Neurologic:  Normal speech and  language. No gross focal neurologic deficits are appreciated.  Skin:  Skin is warm, dry and intact. No rash noted.  ____________________________________________   LABS (all labs ordered are listed, but only abnormal results are displayed)  Labs Reviewed  LACTIC ACID, PLASMA - Abnormal; Notable for the following components:      Result Value   Lactic Acid, Venous 2.1 (*)    All other components within normal limits  COMPREHENSIVE METABOLIC PANEL - Abnormal; Notable for the following components:   Sodium 133 (*)    Glucose, Bld 273 (*)    Creatinine, Ser 1.17 (*)    GFR calc non Af Amer 47 (*)    GFR calc Af Amer 54 (*)    All other components within normal limits  CBC WITH DIFFERENTIAL/PLATELET - Abnormal; Notable for the following components:   WBC 13.0 (*)    Neutro Abs 11.2 (*)    Lymphs Abs 0.5 (*)    Abs Immature Granulocytes 0.13 (*)    All other components within normal limits  URINALYSIS, ROUTINE W REFLEX MICROSCOPIC - Abnormal; Notable for the following components:   APPearance HAZY (*)    Glucose, UA 50 (*)    Hgb urine dipstick SMALL (*)    Ketones, ur 5 (*)    Protein, ur >=300 (*)    Bacteria, UA RARE (*)    Crystals PRESENT (*)    All other components within normal limits  TROPONIN I - Abnormal; Notable for the following components:   Troponin I 0.03 (*)    All other components within normal limits  CULTURE, BLOOD (ROUTINE X 2)  CULTURE, BLOOD (ROUTINE X 2)  LACTIC ACID, PLASMA  LEVETIRACETAM LEVEL  INFLUENZA PANEL BY PCR (TYPE A & B)   ____________________________________________  EKG   EKG Interpretation  Date/Time:  Tuesday May 31 2018 21:24:44 EST Ventricular Rate:  96 PR Interval:    QRS Duration: 102 QT Interval:  415 QTC Calculation: 525 R Axis:   9 Text Interpretation:  Sinus rhythm Prolonged QT interval TWI in III somewhat new, minor ST elevation in III without contiguous leads or reciprocal changes Confirmed by Marily MemosMesner, Matsue Strom [phone]([id number])  on 05/31/2018 9:34:26 PM       ____________________________________________  RADIOLOGY  Ct Head Wo Contrast  Result Date: 05/31/2018 CLINICAL DATA:  Status post fall twice today. EXAM: CT HEAD WITHOUT CONTRAST TECHNIQUE: Contiguous axial images were obtained from the base of the skull through the vertex without intravenous contrast. COMPARISON:  None. FINDINGS: Brain: No evidence of acute infarction, hemorrhage, hydrocephalus, extra-axial collection or mass lesion/mass effect. Vascular: No hyperdense vessel or unexpected calcification. Skull: No osseous abnormality. Sinuses/Orbits: Visualized paranasal sinuses are clear. Visualized mastoid sinuses are clear. Visualized orbits demonstrate no focal abnormality. Other: None IMPRESSION: No acute intracranial pathology. Electronically Signed   By: Elige KoHetal  Patel   On: 05/31/2018 22:19   Dg Chest Portable 1 View  Result Date: 06/01/2018 CLINICAL DATA:  Hypoxia EXAM: PORTABLE  CHEST 1 VIEW COMPARISON:  12/26/2014 FINDINGS: The heart size and mediastinal contours are within normal limits. Both lungs are clear. The visualized skeletal structures are unremarkable. IMPRESSION: No active disease. Electronically Signed   By: Elige KoHetal  Patel   On: 06/01/2018 00:34    ____________________________________________   PROCEDURES  Procedure(s) performed:   Procedures   ____________________________________________   INITIAL IMPRESSION / ASSESSMENT AND PLAN / ED COURSE  Suspect UTI versus pneumonia with her low oxygen.  We will get a chest x-ray, urinalysis rectal temperature.  Start with some fluids.    Clinical Course as of Jun 01 116  Tue May 31, 2018  2324 Likely demand.  Troponin I(!!): 0.03 [JM]  2324 Barely elevated. Fluids given. Abx given.   Lactic Acid, Venous(!!): 2.1 [JM]  2324 noted  WBC(!): 13.0 [JM]  2324 Elevated. Fluids givne.   Creatinine(!): 1.17 [JM]    Clinical Course User Index [JM] Amisadai Woodford, Barbara CowerJason, MD    Suspect UTI with  bacteria, wbc, culture sent. Already on appropriate antibiotics.   Technically early sepsis in older person, and weakness so will admit for observation.  Pertinent labs & imaging results that were available during my care of the patient were reviewed by me and considered in my medical decision making (see chart for details).  ____________________________________________  FINAL CLINICAL IMPRESSION(S) / ED DIAGNOSES  Final diagnoses:  None     MEDICATIONS GIVEN DURING THIS VISIT:  Medications  azithromycin (ZITHROMAX) 500 mg in sodium chloride 0.9 % 250 mL IVPB (has no administration in time range)  sodium chloride 0.9 % bolus 1,000 mL (0 mLs Intravenous Stopped 05/31/18 2348)  cefTRIAXone (ROCEPHIN) 1 g in sodium chloride 0.9 % 100 mL IVPB (0 g Intravenous Stopped 06/01/18 0103)  sodium chloride 0.9 % bolus 1,000 mL (1,000 mLs Intravenous New Bag/Given 06/01/18 0008)     NEW OUTPATIENT MEDICATIONS STARTED DURING THIS VISIT:  New Prescriptions   No medications on file    Note:  This note was prepared with assistance of Dragon voice recognition software. Occasional wrong-word or sound-a-like substitutions may have occurred due to the inherent limitations of voice recognition software.   Bairon Klemann, Barbara CowerJason, MD 06/01/18 [phone]

## 2018-05-31 NOTE — ED Notes (Signed)
In CT

## 2018-05-31 NOTE — ED Notes (Signed)
Bed: AL93 Expected date:  Expected time:  Means of arrival:  Comments: 73 yr old fatigue, fall

## 2018-05-31 NOTE — ED Notes (Signed)
Pt on purewick. Waiting for urine specimen.

## 2018-05-31 NOTE — ED Triage Notes (Signed)
Pt comes to ed via home, at baseline dementia not alert to event or time. Family says she has fallen twice to day, superficial skin tears on both knee's and increased fatigue and weakness.  Sam splint on neck.  V/s 159/86, pluse 105, cbg 274, rr24 on 2 liters. Hx of HTN, diabetes and dementia. Fell from out here bed ( est 3 to 4 ft ) per ems.  possible UTI with urine changes.

## 2018-06-01 ENCOUNTER — Emergency Department (HOSPITAL_COMMUNITY): Payer: Medicare Other

## 2018-06-01 ENCOUNTER — Other Ambulatory Visit: Payer: Self-pay

## 2018-06-01 DIAGNOSIS — Z87891 Personal history of nicotine dependence: Secondary | ICD-10-CM

## 2018-06-01 DIAGNOSIS — M79645 Pain in left finger(s): Secondary | ICD-10-CM

## 2018-06-01 DIAGNOSIS — Z7982 Long term (current) use of aspirin: Secondary | ICD-10-CM | POA: Diagnosis not present

## 2018-06-01 DIAGNOSIS — Z888 Allergy status to other drugs, medicaments and biological substances status: Secondary | ICD-10-CM | POA: Diagnosis not present

## 2018-06-01 DIAGNOSIS — A4102 Sepsis due to Methicillin resistant Staphylococcus aureus: Secondary | ICD-10-CM | POA: Diagnosis present

## 2018-06-01 DIAGNOSIS — S0083XA Contusion of other part of head, initial encounter: Secondary | ICD-10-CM

## 2018-06-01 DIAGNOSIS — I6522 Occlusion and stenosis of left carotid artery: Secondary | ICD-10-CM | POA: Diagnosis present

## 2018-06-01 DIAGNOSIS — S8002XA Contusion of left knee, initial encounter: Secondary | ICD-10-CM

## 2018-06-01 DIAGNOSIS — R296 Repeated falls: Secondary | ICD-10-CM | POA: Diagnosis present

## 2018-06-01 DIAGNOSIS — I639 Cerebral infarction, unspecified: Secondary | ICD-10-CM | POA: Diagnosis present

## 2018-06-01 DIAGNOSIS — B9562 Methicillin resistant Staphylococcus aureus infection as the cause of diseases classified elsewhere: Secondary | ICD-10-CM | POA: Diagnosis present

## 2018-06-01 DIAGNOSIS — R10816 Epigastric abdominal tenderness: Secondary | ICD-10-CM | POA: Diagnosis not present

## 2018-06-01 DIAGNOSIS — G309 Alzheimer's disease, unspecified: Secondary | ICD-10-CM | POA: Diagnosis present

## 2018-06-01 DIAGNOSIS — W19XXXA Unspecified fall, initial encounter: Secondary | ICD-10-CM

## 2018-06-01 DIAGNOSIS — Y92009 Unspecified place in unspecified non-institutional (private) residence as the place of occurrence of the external cause: Secondary | ICD-10-CM | POA: Diagnosis not present

## 2018-06-01 DIAGNOSIS — R0902 Hypoxemia: Secondary | ICD-10-CM | POA: Diagnosis present

## 2018-06-01 DIAGNOSIS — N390 Urinary tract infection, site not specified: Secondary | ICD-10-CM | POA: Diagnosis present

## 2018-06-01 DIAGNOSIS — N3001 Acute cystitis with hematuria: Secondary | ICD-10-CM

## 2018-06-01 DIAGNOSIS — I10 Essential (primary) hypertension: Secondary | ICD-10-CM | POA: Diagnosis present

## 2018-06-01 DIAGNOSIS — G40909 Epilepsy, unspecified, not intractable, without status epilepticus: Secondary | ICD-10-CM

## 2018-06-01 DIAGNOSIS — S8001XA Contusion of right knee, initial encounter: Secondary | ICD-10-CM

## 2018-06-01 DIAGNOSIS — E119 Type 2 diabetes mellitus without complications: Secondary | ICD-10-CM

## 2018-06-01 DIAGNOSIS — J101 Influenza due to other identified influenza virus with other respiratory manifestations: Secondary | ICD-10-CM | POA: Diagnosis present

## 2018-06-01 DIAGNOSIS — R7881 Bacteremia: Secondary | ICD-10-CM | POA: Diagnosis not present

## 2018-06-01 DIAGNOSIS — Z800 Family history of malignant neoplasm of digestive organs: Secondary | ICD-10-CM | POA: Diagnosis not present

## 2018-06-01 DIAGNOSIS — A419 Sepsis, unspecified organism: Secondary | ICD-10-CM | POA: Diagnosis not present

## 2018-06-01 DIAGNOSIS — R531 Weakness: Secondary | ICD-10-CM | POA: Diagnosis present

## 2018-06-01 DIAGNOSIS — E1100 Type 2 diabetes mellitus with hyperosmolarity without nonketotic hyperglycemic-hyperosmolar coma (NKHHC): Secondary | ICD-10-CM | POA: Diagnosis not present

## 2018-06-01 DIAGNOSIS — F0390 Unspecified dementia without behavioral disturbance: Secondary | ICD-10-CM

## 2018-06-01 DIAGNOSIS — R9431 Abnormal electrocardiogram [ECG] [EKG]: Secondary | ICD-10-CM | POA: Diagnosis present

## 2018-06-01 DIAGNOSIS — Z79899 Other long term (current) drug therapy: Secondary | ICD-10-CM | POA: Diagnosis not present

## 2018-06-01 DIAGNOSIS — R29702 NIHSS score 2: Secondary | ICD-10-CM | POA: Diagnosis present

## 2018-06-01 DIAGNOSIS — Z794 Long term (current) use of insulin: Secondary | ICD-10-CM | POA: Diagnosis not present

## 2018-06-01 DIAGNOSIS — E785 Hyperlipidemia, unspecified: Secondary | ICD-10-CM | POA: Diagnosis present

## 2018-06-01 DIAGNOSIS — F0280 Dementia in other diseases classified elsewhere without behavioral disturbance: Secondary | ICD-10-CM | POA: Diagnosis not present

## 2018-06-01 DIAGNOSIS — R10811 Right upper quadrant abdominal tenderness: Secondary | ICD-10-CM

## 2018-06-01 DIAGNOSIS — E1151 Type 2 diabetes mellitus with diabetic peripheral angiopathy without gangrene: Secondary | ICD-10-CM | POA: Diagnosis present

## 2018-06-01 DIAGNOSIS — Z8249 Family history of ischemic heart disease and other diseases of the circulatory system: Secondary | ICD-10-CM | POA: Diagnosis not present

## 2018-06-01 DIAGNOSIS — G300 Alzheimer's disease with early onset: Secondary | ICD-10-CM | POA: Diagnosis not present

## 2018-06-01 LAB — BLOOD CULTURE ID PANEL (REFLEXED)

## 2018-06-01 LAB — COMPREHENSIVE METABOLIC PANEL
ALT: 23 U/L (ref 0–44)
AST: 59 U/L — ABNORMAL HIGH (ref 15–41)
Albumin: 3.4 g/dL — ABNORMAL LOW (ref 3.5–5.0)
Alkaline Phosphatase: 73 U/L (ref 38–126)
Anion gap: 10 (ref 5–15)
BUN: 10 mg/dL (ref 8–23)
CO2: 23 mmol/L (ref 22–32)
Calcium: 8.4 mg/dL — ABNORMAL LOW (ref 8.9–10.3)
Chloride: 102 mmol/L (ref 98–111)
Creatinine, Ser: 0.7 mg/dL (ref 0.44–1.00)
GFR calc Af Amer: >60 mL/min (ref >60)
GFR calc non Af Amer: >60 mL/min (ref >60)
Glucose, Bld: 217 mg/dL — ABNORMAL HIGH (ref 70–99)
Potassium: 3.2 mmol/L — ABNORMAL LOW (ref 3.5–5.1)
Sodium: 135 mmol/L (ref 135–145)
Total Bilirubin: 0.5 mg/dL (ref 0.3–1.2)
Total Protein: 7 g/dL (ref 6.5–8.1)

## 2018-06-01 LAB — INFLUENZA PANEL BY PCR (TYPE A & B)
Influenza A By PCR: POSITIVE — AB
Influenza B By PCR: NEGATIVE

## 2018-06-01 LAB — GLUCOSE, CAPILLARY
Glucose-Capillary: 242 mg/dL — ABNORMAL HIGH (ref 70–99)
Glucose-Capillary: 194 mg/dL — ABNORMAL HIGH (ref 70–99)
Glucose-Capillary: 200 mg/dL — ABNORMAL HIGH (ref 70–99)
Glucose-Capillary: 135 mg/dL — ABNORMAL HIGH (ref 70–99)
Glucose-Capillary: 109 mg/dL — ABNORMAL HIGH (ref 70–99)

## 2018-06-01 LAB — TROPONIN I
Troponin I: 0.1 ng/mL (ref <0.03)
Troponin I: 0.06 ng/mL (ref <0.03)

## 2018-06-01 LAB — CBC
HCT: 39.6 % (ref 36.0–46.0)
Hemoglobin: 11.9 g/dL — ABNORMAL LOW (ref 12.0–15.0)
MCH: 27.9 pg (ref 26.0–34.0)
MCHC: 30.1 g/dL (ref 30.0–36.0)
MCV: 93 fL (ref 80.0–100.0)
Platelets: 224 10*3/uL (ref 150–400)
RBC: 4.26 MIL/uL (ref 3.87–5.11)
RDW: 13.2 % (ref 11.5–15.5)
WBC: 7.4 10*3/uL (ref 4.0–10.5)
nRBC: 0 % (ref 0.0–0.2)

## 2018-06-01 LAB — HEMOGLOBIN A1C
Hgb A1c MFr Bld: 9 % — ABNORMAL HIGH (ref 4.8–5.6)
Mean Plasma Glucose: 211.6 mg/dL

## 2018-06-01 LAB — MAGNESIUM: Magnesium: 1.8 mg/dL (ref 1.7–2.4)

## 2018-06-01 MED ORDER — METOPROLOL TARTRATE 25 MG PO TABS
25.0000 mg | ORAL_TABLET | Freq: Two times a day (BID) | ORAL | Status: DC
  Administered 2018-06-01 – 2018-06-08 (×15): 25 mg via ORAL
  Filled 2018-06-01 (×16): qty 1

## 2018-06-01 MED ORDER — ALBUTEROL SULFATE (2.5 MG/3ML) 0.083% IN NEBU: 2.5000 mg | INHALATION_SOLUTION | RESPIRATORY_TRACT | Status: DC | PRN

## 2018-06-01 MED ORDER — ASPIRIN EC 81 MG PO TBEC
81.0000 mg | DELAYED_RELEASE_TABLET | Freq: Every day | ORAL | Status: DC
  Administered 2018-06-01 – 2018-06-02 (×2): 81 mg via ORAL
  Filled 2018-06-01 (×2): qty 1

## 2018-06-01 MED ORDER — ACETAMINOPHEN 650 MG RE SUPP: 650.0000 mg | Freq: Four times a day (QID) | RECTAL | Status: DC | PRN

## 2018-06-01 MED ORDER — SODIUM CHLORIDE 0.9% FLUSH: 10.0000 mL | INTRAVENOUS | Status: DC | PRN

## 2018-06-01 MED ORDER — SODIUM CHLORIDE 0.9 % IV SOLN
INTRAVENOUS | Status: DC
  Administered 2018-06-01: 17:00:00 via INTRAVENOUS
  Filled 2018-06-01 (×4): qty 1000

## 2018-06-01 MED ORDER — LEVETIRACETAM 500 MG PO TABS
500.0000 mg | ORAL_TABLET | Freq: Two times a day (BID) | ORAL | Status: DC
  Administered 2018-06-01 – 2018-06-08 (×16): 500 mg via ORAL
  Filled 2018-06-01 (×16): qty 1

## 2018-06-01 MED ORDER — ENOXAPARIN SODIUM 40 MG/0.4ML ~~LOC~~ SOLN
40.0000 mg | Freq: Every day | SUBCUTANEOUS | Status: DC
  Administered 2018-06-01 – 2018-06-08 (×8): 40 mg via SUBCUTANEOUS
  Filled 2018-06-01 (×8): qty 0.4

## 2018-06-01 MED ORDER — ONDANSETRON HCL 4 MG/2ML IJ SOLN: 4.0000 mg | Freq: Four times a day (QID) | INTRAMUSCULAR | Status: DC | PRN

## 2018-06-01 MED ORDER — INSULIN ASPART 100 UNIT/ML ~~LOC~~ SOLN
0.0000 [IU] | Freq: Three times a day (TID) | SUBCUTANEOUS | Status: DC
  Administered 2018-06-01 (×2): 2 [IU] via SUBCUTANEOUS
  Administered 2018-06-01 – 2018-06-03 (×4): 1 [IU] via SUBCUTANEOUS
  Administered 2018-06-04: 2 [IU] via SUBCUTANEOUS
  Administered 2018-06-04 – 2018-06-05 (×3): 1 [IU] via SUBCUTANEOUS
  Administered 2018-06-05: 3 [IU] via SUBCUTANEOUS
  Administered 2018-06-06 – 2018-06-08 (×5): 1 [IU] via SUBCUTANEOUS

## 2018-06-01 MED ORDER — INSULIN GLARGINE 100 UNIT/ML ~~LOC~~ SOLN: 18.0000 [IU] | Freq: Every day | SUBCUTANEOUS | Status: DC

## 2018-06-01 MED ORDER — SODIUM CHLORIDE 0.9 % IV SOLN
500.0000 mg | INTRAVENOUS | Status: DC
  Filled 2018-06-01: qty 500

## 2018-06-01 MED ORDER — OSELTAMIVIR PHOSPHATE 75 MG PO CAPS
75.0000 mg | ORAL_CAPSULE | Freq: Two times a day (BID) | ORAL | Status: AC
  Administered 2018-06-01 – 2018-06-05 (×10): 75 mg via ORAL
  Filled 2018-06-01 (×10): qty 1

## 2018-06-01 MED ORDER — VANCOMYCIN HCL 10 G IV SOLR
1250.0000 mg | INTRAVENOUS | Status: AC
  Administered 2018-06-02 – 2018-06-07 (×6): 1250 mg via INTRAVENOUS
  Filled 2018-06-01 (×6): qty 1250

## 2018-06-01 MED ORDER — DONEPEZIL HCL 5 MG PO TABS
5.0000 mg | ORAL_TABLET | Freq: Every day | ORAL | Status: DC
  Administered 2018-06-01 – 2018-06-07 (×7): 5 mg via ORAL
  Filled 2018-06-01 (×7): qty 1

## 2018-06-01 MED ORDER — GUAIFENESIN ER 600 MG PO TB12
600.0000 mg | ORAL_TABLET | Freq: Two times a day (BID) | ORAL | Status: DC
  Administered 2018-06-01 – 2018-06-08 (×16): 600 mg via ORAL
  Filled 2018-06-01 (×16): qty 1

## 2018-06-01 MED ORDER — ONDANSETRON HCL 4 MG PO TABS: 4.0000 mg | ORAL_TABLET | Freq: Four times a day (QID) | ORAL | Status: DC | PRN

## 2018-06-01 MED ORDER — ACETAMINOPHEN 325 MG PO TABS
650.0000 mg | ORAL_TABLET | Freq: Four times a day (QID) | ORAL | Status: DC | PRN
  Administered 2018-06-04: 650 mg via ORAL
  Filled 2018-06-01: qty 2

## 2018-06-01 MED ORDER — INSULIN GLARGINE 100 UNIT/ML ~~LOC~~ SOLN
18.0000 [IU] | Freq: Every day | SUBCUTANEOUS | Status: DC
  Administered 2018-06-01 – 2018-06-08 (×8): 18 [IU] via SUBCUTANEOUS
  Filled 2018-06-01 (×8): qty 0.18

## 2018-06-01 MED ORDER — SODIUM CHLORIDE 0.9 % IV SOLN
1.0000 g | INTRAVENOUS | Status: DC
  Filled 2018-06-01: qty 10

## 2018-06-01 MED ORDER — SODIUM CHLORIDE 0.9 % IV SOLN
INTRAVENOUS | Status: DC | PRN
  Administered 2018-06-01: 500 mL via INTRAVENOUS

## 2018-06-01 MED ORDER — LORATADINE 10 MG PO TABS
10.0000 mg | ORAL_TABLET | Freq: Every day | ORAL | Status: DC
  Administered 2018-06-01 – 2018-06-08 (×8): 10 mg via ORAL
  Filled 2018-06-01 (×8): qty 1

## 2018-06-01 MED ORDER — VANCOMYCIN HCL 10 G IV SOLR
2000.0000 mg | INTRAVENOUS | Status: AC
  Administered 2018-06-01: 2000 mg via INTRAVENOUS
  Filled 2018-06-01: qty 2000
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
## 2018-06-01 NOTE — H&P (Addendum)
TRH H&P    Patient Demographics:    Paula Reed, is a 73 y.o. female  MRN: [id number]  DOB - 12-Jun-1945  Admit Date - 05/31/2018  Referring MD/NP/PA: Kennon Holter  Outpatient Primary MD for the patient is Leeanne Rio, MD  Patient coming from: Home  Chief complaint-fall   HPI:    Paula Reed  is a 73 y.o. female, with history of dementia, hyperlipidemia, hypertension, diabetes mellitus type 2, seizure disorder came to hospital after patient fell at home.  Patient is a poor historian, she denies any complaints.  No chest pain or shortness of breath.  No nausea vomiting or diarrhea.  No abdominal pain.  No dysuria urgency or frequency of urination. In the ED patient was found to be febrile with a temp of 101.9.  She had mildly abnormal UA and started on ceftriaxone and Zithromax for possible UTI and questionable pneumonia.  Her O2 sats were 91% on room air. Influenza PCR came back positive for influenza A     Review of systems:    In addition to the HPI above,    All other systems reviewed and are negative.    Past History of the following :    Past Medical History:  Diagnosis Date  . Allergic rhinitis   . Diabetes ([place])   . Diabetes mellitus without complication ([place])   . Hemorrhoids   . Hyperlipidemia   . Hypertension   . Probable Alzheimer's dementia without behavioral disturbance 09/15/2015   May 2017 [facility] score 8/30   . Seizures ([place])       Past Surgical History:  Procedure Laterality Date  . carpel tunnel release    . ECTOPIC PREGNANCY SURGERY        Social History:      Social History   Tobacco Use  . Smoking status: Former Research scientist (life sciences)  . Smokeless tobacco: Never Used  Substance Use Topics  . Alcohol use: No    Frequency: Never       Family History :     Family History  Problem Relation Age of Onset  . Heart disease Father   . Hypertension Father   .  Hypertension Mother   . Colon cancer Other        aunt      Home Medications:   Prior to Admission medications   Medication Sig Start Date End Date Taking? Authorizing Provider  aspirin 81 MG tablet Take 81 mg by mouth daily. Reported on 10/10/2015   Yes [provider]  Dextromethorphan HBr (COUGH RELIEF PO) Take 10 mLs by mouth daily as needed (cough).   Yes [provider]  insulin glargine (LANTUS) 100 UNIT/ML injection INJECT 18 UNITS INTO THE SKIN AT BEDTIME. 04/13/18  Yes Leeanne Rio, MD  levETIRAcetam (KEPPRA) 500 MG tablet TAKE 1 TABLET BY MOUTH TWICE A DAY 12/17/17  Yes Alveda Reasons, MD  metoprolol tartrate (LOPRESSOR) 25 MG tablet TAKE 1 TABLET BY MOUTH TWICE A DAY 04/07/18  Yes Leeanne Rio, MD  triamcinolone ointment (KENALOG)  0.5 % Apply 1 application topically 2 (two) times daily. Apply to area on back. 03/01/18  Yes Enid Derry, [place], DO  Blood Glucose Monitoring Suppl (ONETOUCH VERIO) w/Device KIT 1 kit by Does not apply route daily. Check sugar twice daily 06/25/17   Leeanne Rio, MD  cetirizine (ZYRTEC) 5 MG tablet Take 1 tablet (5 mg total) by mouth daily. 05/10/18   Leeanne Rio, MD  donepezil (ARICEPT) 5 MG tablet TAKE 1 TABLET (5 MG TOTAL) AT BEDTIME BY MOUTH. 04/13/18   Leeanne Rio, MD  glucose blood [facility] VERIO) test strip Check sugar twice daily 06/25/17   Leeanne Rio, MD  Insulin Syringe-Needle U-100 (INSULIN SYRINGE .3CC/31GX5/16") 31G X 5/16" 0.3 ML MISC Use to inject lantus daily 07/26/17   Leeanne Rio, MD  Lancet Devices (ONE TOUCH DELICA LANCING DEV) MISC Check sugar twice daily 06/25/17   Leeanne Rio, MD  [facility] DELICA LANCETS 61P MISC Check sugar twice daily 06/25/17   Leeanne Rio, MD  triamcinolone cream (KENALOG) 0.1 % Apply 1 application topically 2 (two) times daily as needed. Patient not taking: Reported on 05/31/2018 11/16/17   Leeanne Rio, MD      Allergies:     Allergies  Allergen Reactions  . Chlorthalidone Rash    Per [facility] records, pt may have had a rash rxn to chlorthalidone     Physical Exam:   Vitals  Blood pressure (!) 163/66, pulse 82, temperature 98.4 F (36.9 C), temperature source Oral, resp. rate 20, SpO2 100 %.  1.  General: Appears in no acute distress  2. Psychiatric: Alert, oriented x3  3. Neurologic: Cranial nerves II to XII grossly intact, motor strength 5/5 in all extremities  4. HEENMT:  Atraumatic normocephalic, extraocular muscles intact.  Oral mucosa is pink and moist.  5. Respiratory : Clear to auscultation bilaterally, no wheezing or crackles.  6. Cardiovascular : S1-S2, regular, no murmur auscultated  7. Gastrointestinal:  Abdomen is soft, nontender, no organomegaly      Data Review:    CBC Recent Labs  Lab 05/31/18 2140  WBC 13.0*  HGB 12.9  HCT 41.1  PLT 268  MCV 89.5  MCH 28.1  MCHC 31.4  RDW 13.0  LYMPHSABS 0.5*  MONOABS 1.0  EOSABS 0.1  BASOSABS 0.0   ------------------------------------------------------------------------------------------------------------------  Results for orders placed or performed during the hospital encounter of 05/31/18 (from the past 48 hour(s))  Lactic acid, plasma     Status: Abnormal   Collection Time: 05/31/18  9:40 PM  Result Value Ref Range   Lactic Acid, Venous 2.1 (HH) 0.5 - 1.9 mmol/L    Comment: CRITICAL RESULT CALLED TO, READ BACK BY AND VERIFIED WITHAmalia Greenhouse RN 2232 05/31/18 A NAVARRO Performed at [facility], [place] [address] [place], [place] [address]   Comprehensive metabolic panel     Status: Abnormal   Collection Time: 05/31/18  9:40 PM  Result Value Ref Range   Sodium 133 (L) 135 - 145 mmol/L   Potassium 3.5 3.5 - 5.1 mmol/L   Chloride 100 98 - 111 mmol/L   CO2 22 22 - 32 mmol/L   Glucose, Bld 273 (H) 70 - 99 mg/dL   BUN 15 8 - 23 mg/dL   Creatinine, Ser 1.17 (H) 0.44 - 1.00  mg/dL   Calcium 9.1 8.9 - 10.3 mg/dL   Total Protein 8.0 6.5 - 8.1 g/dL   Albumin 4.0 3.5 - 5.0 g/dL   AST  41 15 - 41 U/L   ALT 22 0 - 44 U/L   Alkaline Phosphatase 81 38 - 126 U/L   Total Bilirubin 1.1 0.3 - 1.2 mg/dL   GFR calc non Af Amer 47 (L) >60 mL/min   GFR calc Af Amer 54 (L) >60 mL/min   Anion gap 11 5 - 15    Comment: Performed at [facility], [place] [address], [place], [place] [address]  CBC WITH DIFFERENTIAL     Status: Abnormal   Collection Time: 05/31/18  9:40 PM  Result Value Ref Range   WBC 13.0 (H) 4.0 - 10.5 K/uL   RBC 4.59 3.87 - 5.11 MIL/uL   Hemoglobin 12.9 12.0 - 15.0 g/dL   HCT 41.1 36.0 - 46.0 %   MCV 89.5 80.0 - 100.0 fL   MCH 28.1 26.0 - 34.0 pg   MCHC 31.4 30.0 - 36.0 g/dL   RDW 13.0 11.5 - 15.5 %   Platelets 268 150 - 400 K/uL   nRBC 0.0 0.0 - 0.2 %   Neutrophils Relative % 86 %   Neutro Abs 11.2 (H) 1.7 - 7.7 K/uL   Lymphocytes Relative 4 %   Lymphs Abs 0.5 (L) 0.7 - 4.0 K/uL   Monocytes Relative 8 %   Monocytes Absolute 1.0 0.1 - 1.0 K/uL   Eosinophils Relative 1 %   Eosinophils Absolute 0.1 0.0 - 0.5 K/uL   Basophils Relative 0 %   Basophils Absolute 0.0 0.0 - 0.1 K/uL   Immature Granulocytes 1 %   Abs Immature Granulocytes 0.13 (H) 0.00 - 0.07 K/uL    Comment: Performed at [facility], [place] [address], [place], [place] [address]  Troponin I -     Status: Abnormal   Collection Time: 05/31/18  9:40 PM  Result Value Ref Range   Troponin I 0.03 (HH) <0.03 ng/mL    Comment: CRITICAL RESULT CALLED TO, READ BACK BY AND VERIFIED WITHAmalia Greenhouse RN 2232 05/31/18 A NAVARRO Performed at [facility], [place] [address] [place], [place] [address]   Urinalysis, Routine w reflex microscopic     Status: Abnormal   Collection Time: 05/31/18 11:17 PM  Result Value Ref Range   Color, Urine YELLOW YELLOW   APPearance HAZY (A) CLEAR   Specific Gravity, Urine 1.025 1.005 - 1.030   pH 5.0 5.0 - 8.0    Glucose, UA 50 (A) NEGATIVE mg/dL   Hgb urine dipstick SMALL (A) NEGATIVE   Bilirubin Urine NEGATIVE NEGATIVE   Ketones, ur 5 (A) NEGATIVE mg/dL   Protein, ur >=300 (A) NEGATIVE mg/dL   Nitrite NEGATIVE NEGATIVE   Leukocytes, UA NEGATIVE NEGATIVE   RBC / HPF 0-5 0 - 5 RBC/hpf   WBC, UA 11-20 0 - 5 WBC/hpf   Bacteria, UA RARE (A) NONE SEEN   Squamous Epithelial / LPF 0-5 0 - 5   Mucus PRESENT    Hyaline Casts, UA PRESENT    Amorphous Crystal PRESENT    Crystals PRESENT (A) NEGATIVE    Comment: Performed at [facility], [place] [address], [place], [place] [address]  Lactic acid, plasma     Status: None   Collection Time: 05/31/18 11:28 PM  Result Value Ref Range   Lactic Acid, Venous 1.8 0.5 - 1.9 mmol/L    Comment: Performed at [facility], [place] [address], [place], [place] [address]  Influenza panel by PCR (type A & B)  Status: Abnormal   Collection Time: 06/01/18  1:11 AM  Result Value Ref Range   Influenza A By PCR POSITIVE (A) NEGATIVE   Influenza B By PCR NEGATIVE NEGATIVE    Comment: (NOTE) The Xpert Xpress Flu assay is intended as an aid in the diagnosis of  influenza and should not be used as a sole basis for treatment.  This  assay is FDA approved for nasopharyngeal swab specimens only. Nasal  washings and aspirates are unacceptable for Xpert Xpress Flu testing. Performed at [facility], [place] [address] [place], [place] [address]     Chemistries  Recent Labs  Lab 05/31/18 2140  NA 133*  K 3.5  CL 100  CO2 22  GLUCOSE 273*  BUN 15  CREATININE 1.17*  CALCIUM 9.1  AST 41  ALT 22  ALKPHOS 81  BILITOT 1.1   ------------------------------------------------------------------------------------------------------------------  ------------------------------------------------------------------------------------------------------------------ GFR: CrCl cannot be calculated (Unknown ideal weight.). Liver  Function Tests: Recent Labs  Lab 05/31/18 2140  AST 41  ALT 22  ALKPHOS 81  BILITOT 1.1  PROT 8.0  ALBUMIN 4.0   No results for input(s): LIPASE, AMYLASE in the last 168 hours. No results for input(s): AMMONIA in the last 168 hours. Coagulation Profile: No results for input(s): INR, PROTIME in the last 168 hours. Cardiac Enzymes: Recent Labs  Lab 05/31/18 2140  TROPONINI 0.03*   BNP (last 3 results) No results for input(s): PROBNP in the last 8760 hours. HbA1C: No results for input(s): HGBA1C in the last 72 hours. CBG: No results for input(s): GLUCAP in the last 168 hours. Lipid Profile: No results for input(s): CHOL, HDL, LDLCALC, TRIG, CHOLHDL, LDLDIRECT in the last 72 hours. Thyroid Function Tests: No results for input(s): TSH, T4TOTAL, FREET4, T3FREE, THYROIDAB in the last 72 hours. Anemia Panel: No results for input(s): VITAMINB12, FOLATE, FERRITIN, TIBC, IRON, RETICCTPCT in the last 72 hours.  --------------------------------------------------------------------------------------------------------------- Urine analysis:    Component Value Date/Time   COLORURINE YELLOW 05/31/2018 2317   APPEARANCEUR HAZY (A) 05/31/2018 2317   LABSPEC 1.025 05/31/2018 2317   PHURINE 5.0 05/31/2018 2317   GLUCOSEU 50 (A) 05/31/2018 2317   HGBUR SMALL (A) 05/31/2018 2317   BILIRUBINUR NEGATIVE 05/31/2018 2317   BILIRUBINUR small 06/11/2016 1421   KETONESUR 5 (A) 05/31/2018 2317   PROTEINUR >=300 (A) 05/31/2018 2317   UROBILINOGEN >=8.0 06/11/2016 1421   NITRITE NEGATIVE 05/31/2018 2317   LEUKOCYTESUR NEGATIVE 05/31/2018 2317      Imaging Results:    Ct Head Wo Contrast  Result Date: 05/31/2018 CLINICAL DATA:  Status post fall twice today. EXAM: CT HEAD WITHOUT CONTRAST TECHNIQUE: Contiguous axial images were obtained from the base of the skull through the vertex without intravenous contrast. COMPARISON:  None. FINDINGS: Brain: No evidence of acute infarction, hemorrhage,  hydrocephalus, extra-axial collection or mass lesion/mass effect. Vascular: No hyperdense vessel or unexpected calcification. Skull: No osseous abnormality. Sinuses/Orbits: Visualized paranasal sinuses are clear. Visualized mastoid sinuses are clear. Visualized orbits demonstrate no focal abnormality. Other: None IMPRESSION: No acute intracranial pathology. Electronically Signed   By: Kathreen Devoid   On: 05/31/2018 22:19   Dg Chest Portable 1 View  Result Date: 06/01/2018 CLINICAL DATA:  Hypoxia EXAM: PORTABLE CHEST 1 VIEW COMPARISON:  12/26/2014 FINDINGS: The heart size and mediastinal contours are within normal limits. Both lungs are clear. The visualized skeletal structures are unremarkable. IMPRESSION: No active disease. Electronically Signed   By: Kathreen Devoid   On: 06/01/2018 00:34    My  personal review of EKG: Rhythm NSR, prolonged QTc interval 525   Assessment & Plan:    Active Problems:   Generalized weakness   Influenza A   1. Influenza-patient presents with fever, influenza PCR positive for influenza A, patient empirically started on antibiotics to prevent superimposed infection.  Will continue with ceftriaxone and Zithromax.  Start Tamiflu 75 mg p.o. twice daily.  Follow blood culture results.  2. ? UTI-patient has mildly abnormal UA, denies any symptoms.  Started on antibiotics as above.  Will obtain urine culture.  3. Diabetes mellitus type 2-continue with Lantus 18 units subcu daily, will start sliding scale insulin with NovoLog.  4. Dementia-no behavior  disturbance, continue Aricept.  5. History of seizures-continue Keppra.  6. Prolonged QTc interval-QTC is prolonged to 525, will start cardiac monitoring.  Check serum magnesium level.    DVT Prophylaxis-   Lovenox  AM Labs Ordered, also please review Full Orders  Family Communication: Admission, patients condition and plan of care including tests being ordered have been discussed with the patient  who indicate  understanding and agree with the plan and Code Status.  Code Status: Full code  Admission status: Inpatient: Based on patients clinical presentation and evaluation of above clinical data, I have made determination that patient meets Inpatient criteria at this time.  Time spent in minutes : 60 minutes   Oswald Hillock M.D on 06/01/2018 at 3:26 AM

## 2018-06-01 NOTE — Progress Notes (Signed)
Patient seen and examined  73 y.o. female, with history of dementia, hyperlipidemia, hypertension, diabetes mellitus type 2, seizure disorder came to hospital after patient fell at home.   In the ED patient was found to be febrile with a temp of 101.9.  She had mildly abnormal UA and started on ceftriaxone and Zithromax for possible UTI and questionable pneumonia.  Her O2 sats were 91% on room air. Influenza PCR came back positive for influenza A  Assessment and plan 1. Influenza-patient presents with fever, influenza PCR positive for influenza A, patient empirically started on antibiotics to prevent superimposed infection.  Will continue with ceftriaxone and Zithromax.  on Tamiflu 75 mg p.o. twice daily.  Follow blood culture . Previous MRSA PCR negative  .continues to be hypoxic and dyspneic, continue as inpatient  2. ? UTI-patient has mildly abnormal UA, denies any symptoms.  Started on antibiotics as above.   follow urine culture.  3. Diabetes mellitus type 2-continue with Lantus 18 units subcu daily,  Continue sliding scale insulin with NovoLog.  4. Dementia-no behavior  disturbance, continue Aricept.  5. History of seizures-continue Keppra.  6. Prolonged QTc interval-QTC is prolonged to 525, will start cardiac monitoring.  Check serum magnesium level.   7. bruising-patient noted to have bruising on her bilateral knees and lips. She is unable to provide any history regarding falls. Will obtain social work consult to ensure that she is in a safe living environment

## 2018-06-01 NOTE — Progress Notes (Signed)
PHARMACY - PHYSICIAN COMMUNICATION CRITICAL VALUE ALERT - BLOOD CULTURE IDENTIFICATION (BCID)  Paula Reed is an 73 y.o. female  female presented to the ED on 05/31/2018 s/p fall and c/o weakness.  She was started on ceftriaxone and azithromycin for suspected UTI and PNA. BCID from BCx from 2/4 now back with staph species and staph aureus.   Name of physician (or Provider) Contacted: Dr. Susie Cassette  Current antibiotics: ceftriaxone and azithromycin  Changes to prescribed antibiotics recommended:  Add vancomycin to abx regimen  Results for orders placed or performed during the hospital encounter of 05/31/18  Blood Culture ID Panel (Reflexed) (Collected: 05/31/2018  9:59 PM)  Result Value Ref Range   Enterococcus species NOT DETECTED NOT DETECTED   Listeria monocytogenes NOT DETECTED NOT DETECTED   Staphylococcus species DETECTED (A) NOT DETECTED   Staphylococcus aureus (BCID) DETECTED (A) NOT DETECTED   Methicillin resistance DETECTED (A) NOT DETECTED   Streptococcus species NOT DETECTED NOT DETECTED   Streptococcus agalactiae NOT DETECTED NOT DETECTED   Streptococcus pneumoniae NOT DETECTED NOT DETECTED   Streptococcus pyogenes NOT DETECTED NOT DETECTED   Acinetobacter baumannii NOT DETECTED NOT DETECTED   Enterobacteriaceae species NOT DETECTED NOT DETECTED   Enterobacter cloacae complex NOT DETECTED NOT DETECTED   Escherichia coli NOT DETECTED NOT DETECTED   Klebsiella oxytoca NOT DETECTED NOT DETECTED   Klebsiella pneumoniae NOT DETECTED NOT DETECTED   Proteus species NOT DETECTED NOT DETECTED   Serratia marcescens NOT DETECTED NOT DETECTED   Haemophilus influenzae NOT DETECTED NOT DETECTED   Neisseria meningitidis NOT DETECTED NOT DETECTED   Pseudomonas aeruginosa NOT DETECTED NOT DETECTED   Candida albicans NOT DETECTED NOT DETECTED   Candida glabrata NOT DETECTED NOT DETECTED   Candida krusei NOT DETECTED NOT DETECTED   Candida parapsilosis NOT DETECTED NOT DETECTED   Candida  tropicalis NOT DETECTED NOT DETECTED    [name] 06/01/2018  4:57 PM

## 2018-06-01 NOTE — Progress Notes (Signed)
Pharmacy Antibiotic Note  Paula Reed is a 73 y.o. female presented to the ED on 05/31/2018 s/p fall and c/o weakness.  She was started on ceftriaxone and azithromycin for suspected UTI and PNA. One of four bcx bottles from 2/4 came back with GPC in clusters with BCID positive for staph species and staph aureus (MR resistance detected).  To start vancomycin for bacteremia.  Plan: - vancomycin 2000 mg IV x1, then 1250 mg IV q24h (for est AUC 543) - ceftriaxone 1gm IV q24h and azithromycin 500 mg IV q24h per MD - monitor renal function - f/u cultures  ____________________________________  Height: 5\' 8"  (172.7 cm) Weight: 221 lb 12.5 oz (100.6 kg) IBW/kg (Calculated) : 63.9  Temp (24hrs), Avg:99.2 F (37.3 C), Min:97.3 F (36.3 C), Max:101.9 F (38.8 C)  Recent Labs  Lab 05/31/18 2140 05/31/18 2328 06/01/18 0911  WBC 13.0*  --  7.4  CREATININE 1.17*  --  0.70  LATICACIDVEN 2.1* 1.8  --     Estimated Creatinine Clearance: 80 mL/min (by C-G formula based on SCr of 0.7 mg/dL).    Allergies  Allergen Reactions  . Chlorthalidone Rash    Per [facility] records, pt may have had a rash rxn to chlorthalidone   Antimicrobials this admission:  2/4 azithro>> 2/4 CTX>> 2/5 vanc>>   Microbiology results:  2/4 BCx x2: 1/4 GPC in clusters (BCID=staph species and staph aureus (MR dectected) 2/5 UCx:  2/5 influ A pos   Thank you for allowing pharmacy to be a part of this patient's care.  [name] 06/01/2018 4:43 PM

## 2018-06-01 NOTE — Progress Notes (Signed)
PT Cancellation Note  Patient Details Name: Paula Reed MRN: [id number] DOB: 11-Aug-1945   Cancelled Treatment:    Reason Eval/Treat Not Completed: Fatigue/lethargy limiting ability to participate;Medical issues which prohibited therapy(Per RN, pt is fatigued at this time. Also, pt with uptrending troponins from 0.03 to 0.10 this am, RN paged MD to determine if safe to mobilize but have not heard back. PT to check back as schedule allows.)   Nicola Police, PT Acute Rehabilitation Services Pager [phone]  Office [phone]    Paula Reed 06/01/2018, 1:50 PM

## 2018-06-01 NOTE — Consult Note (Signed)
[facility]    Date of Admission:  05/31/2018    Day 2 ceftriaxone        Day 2 azithromycin        Day 2 oseltamivir        Day 1 vancomycin       Reason for Consult: Automatic consultation for MRSA bacteremia     Assessment: Paula Reed has influenza A complicated by superimposed MRSA bacteremia.  I do not see clear evidence of pneumonia by exam or chest x-ray but I would not be surprised if this developed over the next 24 to 48 hours.  Paula Reed recently had an x-ray of her left hand because of pain in her left long finger.  I do not see any evidence of infection there.  Plan: 1. Continue oseltamivir and vancomycin 2. Discontinue ceftriaxone and azithromycin 3. Droplet and contact precautions 4. Repeat blood cultures in the morning 5. Transthoracic echocardiogram  Principal Problem:   MRSA bacteremia Active Problems:   Influenza A   Diabetes mellitus, type II (HCC)   Hypertension   Hyperlipidemia   Probable Alzheimer's dementia without behavioral disturbance   Seizures (HCC)   Scheduled Meds: . aspirin EC  81 mg Oral Daily  . donepezil  5 mg Oral QHS  . enoxaparin (LOVENOX) injection  40 mg Subcutaneous Daily  . guaiFENesin  600 mg Oral BID  . insulin aspart  0-9 Units Subcutaneous TID WC  . insulin glargine  18 Units Subcutaneous Daily  . levETIRAcetam  500 mg Oral BID  . loratadine  10 mg Oral Daily  . metoprolol tartrate  25 mg Oral BID  . oseltamivir  75 mg Oral BID   Continuous Infusions: . azithromycin    . cefTRIAXone (ROCEPHIN)  IV    . 0.9 % sodium chloride with kcl 50 mL/hr at 06/01/18 1718  . [START ON 06/02/2018] vancomycin    . vancomycin     PRN Meds:.acetaminophen **OR** acetaminophen, albuterol, ondansetron **OR** ondansetron (ZOFRAN) IV, sodium chloride flush  HPI: Paula Reed is a 73 y.o. female with dementia, diabetes, hypertension and a seizure disorder.  Paula Reed was admitted last night after a fall at home.  Paula Reed was  found to be febrile.  There was concern about possible pneumonia.  Paula Reed was started on empiric ceftriaxone and azithromycin.  Her influenza A PCR was positive and Paula Reed was started on oseltamivir.  Today 1 of 2 admission blood cultures is reported to be growing MRSA.  All Paula Reed can tell me is that Paula Reed thinks Paula Reed has had a cold recently.   Review of Systems: Review of Systems  Unable to perform ROS: Mental acuity    Past Medical History:  Diagnosis Date  . Allergic rhinitis   . Diabetes (HCC)   . Diabetes mellitus without complication (HCC)   . Hemorrhoids   . Hyperlipidemia   . Hypertension   . 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 Seizures (HCC)     Social History   Tobacco Use  . Smoking status: Former Games developermoker  . Smokeless tobacco: Never Used  Substance Use Topics  . Alcohol use: No    Frequency: Never  . Drug use: No    Family History  Problem Relation Age of Onset  . Heart disease Father   . Hypertension Father   . Hypertension Mother   . Colon cancer Other  aunt   Allergies  Allergen Reactions  . Chlorthalidone Rash    Per [facility] records, pt may have had a rash rxn to chlorthalidone    OBJECTIVE: Blood pressure (!) 168/68, pulse 66, temperature 99.3 F (37.4 C), temperature source Oral, resp. rate (!) 25, height 5\' 8"  (1.727 m), weight 100.6 kg, SpO2 100 %.  Physical Exam Constitutional:      Comments: Paula Reed is resting quietly in bed.  Paula Reed appears comfortable.  Eyes:     Conjunctiva/sclera: Conjunctivae normal.  Cardiovascular:     Rate and Rhythm: Normal rate and regular rhythm.     Heart sounds: No murmur.     Comments: Distant heart sounds. Pulmonary:     Effort: Pulmonary effort is normal.     Breath sounds: Normal breath sounds.  Abdominal:     Palpations: Abdomen is soft.     Tenderness: There is abdominal tenderness.     Comments: Some tenderness with palpation in the right  upper quadrant and epigastrium.  No masses palpable.  Musculoskeletal:        General: No swelling or tenderness.  Skin:    Comments: Paula Reed has some hyperpigmentation/bruising over the left side of her forehead.  He has bruising over both knees.     Lab Results Lab Results  Component Value Date   WBC 7.4 06/01/2018   HGB 11.9 (L) 06/01/2018   HCT 39.6 06/01/2018   MCV 93.0 06/01/2018   PLT 224 06/01/2018    Lab Results  Component Value Date   CREATININE 0.70 06/01/2018   BUN 10 06/01/2018   NA 135 06/01/2018   K 3.2 (L) 06/01/2018   CL 102 06/01/2018   CO2 23 06/01/2018    Lab Results  Component Value Date   ALT 23 06/01/2018   AST 59 (H) 06/01/2018   ALKPHOS 73 06/01/2018   BILITOT 0.5 06/01/2018     Microbiology: Recent Results (from the past 240 hour(s))  Blood Culture (routine x 2)     Status: None (Preliminary result)   Collection Time: 05/31/18  9:41 PM  Result Value Ref Range Status   Specimen Description   Final    BLOOD RIGHT ANTECUBITAL Performed at [facility], [address], [place], [place] [address]    Special Requests   Final    BOTTLES DRAWN AEROBIC AND ANAEROBIC Blood Culture adequate volume Performed at [facility], [address] [place], [place] [address]    Culture   Final    NO GROWTH < 24 HOURS Performed at [facility], [address] [place], [place] [address]    Report Status PENDING  Incomplete  Blood Culture (routine x 2)     Status: None (Preliminary result)   Collection Time: 05/31/18  9:59 PM  Result Value Ref Range Status   Specimen Description   Final    BLOOD LEFT HAND Performed at [facility], [address], [place], [place] [address]    Special Requests   Final    BOTTLES DRAWN AEROBIC AND ANAEROBIC Blood Culture adequate volume Performed at [facility], [address], [place], [place] [address]    Culture  Setup Time   Final     GRAM POSITIVE COCCI IN CLUSTERS AEROBIC BOTTLE ONLY Organism ID to follow CRITICAL RESULT CALLED TO, READ BACK BY AND VERIFIED WITH: Jodi Marble PharmD 16:30 06/01/18 ([email or web address]) Performed at [facility], [address], [place], [place] [address]  Culture GRAM POSITIVE COCCI  Final   Report Status PENDING  Incomplete  Blood Culture ID Panel (Reflexed)     Status: Abnormal   Collection Time: 05/31/18  9:59 PM  Result Value Ref Range Status   Enterococcus species NOT DETECTED NOT DETECTED Final   Listeria monocytogenes NOT DETECTED NOT DETECTED Final   Staphylococcus species DETECTED (A) NOT DETECTED Final    Comment: CRITICAL RESULT CALLED TO, READ BACK BY AND VERIFIED WITH: Jodi Marble PharmD 16:30 06/01/18 ([email or web address])    Staphylococcus aureus (BCID) DETECTED (A) NOT DETECTED Final    Comment: Methicillin (oxacillin)-resistant Staphylococcus aureus (MRSA). MRSA is predictably resistant to beta-lactam antibiotics (except ceftaroline). Preferred therapy is vancomycin unless clinically contraindicated. Patient requires contact precautions if  hospitalized. CRITICAL RESULT CALLED TO, READ BACK BY AND VERIFIED WITH: Jodi Marble PharmD 16:30 06/01/18 ([email or web address])    Methicillin resistance DETECTED (A) NOT DETECTED Final    Comment: RESULT CALLED TO, READ BACK BY AND VERIFIED WITH: Jodi Marble PharmD 16:30 06/01/18 ([email or web address])    Streptococcus species NOT DETECTED NOT DETECTED Final   Streptococcus agalactiae NOT DETECTED NOT DETECTED Final   Streptococcus pneumoniae NOT DETECTED NOT DETECTED Final   Streptococcus pyogenes NOT DETECTED NOT DETECTED Final   Acinetobacter baumannii NOT DETECTED NOT DETECTED Final   Enterobacteriaceae species NOT DETECTED NOT DETECTED Final   Enterobacter cloacae complex NOT DETECTED NOT DETECTED Final   Escherichia coli NOT DETECTED NOT DETECTED Final   Klebsiella oxytoca NOT DETECTED NOT DETECTED Final   Klebsiella pneumoniae NOT DETECTED NOT DETECTED  Final   Proteus species NOT DETECTED NOT DETECTED Final   Serratia marcescens NOT DETECTED NOT DETECTED Final   Haemophilus influenzae NOT DETECTED NOT DETECTED Final   Neisseria meningitidis NOT DETECTED NOT DETECTED Final   Pseudomonas aeruginosa NOT DETECTED NOT DETECTED Final   Candida albicans NOT DETECTED NOT DETECTED Final   Candida glabrata NOT DETECTED NOT DETECTED Final   Candida krusei NOT DETECTED NOT DETECTED Final   Candida parapsilosis NOT DETECTED NOT DETECTED Final   Candida tropicalis NOT DETECTED NOT DETECTED Final    Comment: Performed at [facility], [address] [place], [place] [address]    Cliffton Asters, MD [facility] [phone] pager   [phone] cell 06/01/2018, 5:30 PM

## 2018-06-01 NOTE — ED Notes (Signed)
ED TO INPATIENT HANDOFF REPORT  Name/Age/Gender Paula Reed 73 y.o. female  Code Status Code Status History    Date Active Date Inactive Code Status Order ID Comments User Context   06/05/2017 1029 06/09/2017 1904 Full Code [id number]  Elayne Snare, MD ED      Home/SNF/Other Home  Chief Complaint weakness  Level of Care/Admitting Diagnosis ED Disposition    ED Disposition Condition Comment   Admit  Hospital Area: [facility] [[id number]]  Level of Care: Med-Surg [16]  Diagnosis: Generalized weakness [202334]  Admitting Physician: Meredeth Ide [[id number]]  Attending Physician: Meredeth Ide [[id number]]  PT Class (Do Not Modify): Observation [104]  PT Acc Code (Do Not Modify): Observation [10022]       Medical History Past Medical History:  Diagnosis Date  . Allergic rhinitis   . Diabetes (HCC)   . Diabetes mellitus without complication (HCC)   . Hemorrhoids   . Hyperlipidemia   . Hypertension   . 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 Seizures (HCC)     Allergies Allergies  Allergen Reactions  . Chlorthalidone Rash    Per [facility] records, pt may have had a rash rxn to chlorthalidone    IV Location/Drains/Wounds Patient Lines/Drains/Airways Status   Active Line/Drains/Airways    Name:   Placement date:   Placement time:   Site:   Days:   Peripheral IV 05/31/18 Left Hand   05/31/18    2218    Hand   1          Labs/Imaging Results for orders placed or performed during the hospital encounter of 05/31/18 (from the past 48 hour(s))  Lactic acid, plasma     Status: Abnormal   Collection Time: 05/31/18  9:40 PM  Result Value Ref Range   Lactic Acid, Venous 2.1 (HH) 0.5 - 1.9 mmol/L    Comment: CRITICAL RESULT CALLED TO, READ BACK BY AND VERIFIED WITHDory Horn RN 2232 05/31/18 A NAVARRO Performed at [facility], [address] [place], [place]  [address]   Comprehensive metabolic panel     Status: Abnormal   Collection Time: 05/31/18  9:40 PM  Result Value Ref Range   Sodium 133 (L) 135 - 145 mmol/L   Potassium 3.5 3.5 - 5.1 mmol/L   Chloride 100 98 - 111 mmol/L   CO2 22 22 - 32 mmol/L   Glucose, Bld 273 (H) 70 - 99 mg/dL   BUN 15 8 - 23 mg/dL   Creatinine, Ser 1.68 (H) 0.44 - 1.00 mg/dL   Calcium 9.1 8.9 - 37.2 mg/dL   Total Protein 8.0 6.5 - 8.1 g/dL   Albumin 4.0 3.5 - 5.0 g/dL   AST 41 15 - 41 U/L   ALT 22 0 - 44 U/L   Alkaline Phosphatase 81 38 - 126 U/L   Total Bilirubin 1.1 0.3 - 1.2 mg/dL   GFR calc non Af Amer 47 (L) >60 mL/min   GFR calc Af Amer 54 (L) >60 mL/min   Anion gap 11 5 - 15    Comment: Performed at [facility], [address] [place], [place] [address]  CBC WITH DIFFERENTIAL     Status: Abnormal   Collection Time: 05/31/18  9:40 PM  Result Value Ref Range   WBC 13.0 (H) 4.0 - 10.5 K/uL   RBC 4.59 3.87 - 5.11 MIL/uL   Hemoglobin 12.9  12.0 - 15.0 g/dL   HCT 26.9 48.5 - 46.2 %   MCV 89.5 80.0 - 100.0 fL   MCH 28.1 26.0 - 34.0 pg   MCHC 31.4 30.0 - 36.0 g/dL   RDW 70.3 50.0 - 93.8 %   Platelets 268 150 - 400 K/uL   nRBC 0.0 0.0 - 0.2 %   Neutrophils Relative % 86 %   Neutro Abs 11.2 (H) 1.7 - 7.7 K/uL   Lymphocytes Relative 4 %   Lymphs Abs 0.5 (L) 0.7 - 4.0 K/uL   Monocytes Relative 8 %   Monocytes Absolute 1.0 0.1 - 1.0 K/uL   Eosinophils Relative 1 %   Eosinophils Absolute 0.1 0.0 - 0.5 K/uL   Basophils Relative 0 %   Basophils Absolute 0.0 0.0 - 0.1 K/uL   Immature Granulocytes 1 %   Abs Immature Granulocytes 0.13 (H) 0.00 - 0.07 K/uL    Comment: Performed at [facility], [address] [place], [place] [address]  Troponin I -     Status: Abnormal   Collection Time: 05/31/18  9:40 PM  Result Value Ref Range   Troponin I 0.03 (HH) <0.03 ng/mL    Comment: CRITICAL RESULT CALLED TO, READ BACK BY AND VERIFIED WITHDory Horn RN 2232 05/31/18 A  NAVARRO Performed at [facility], [address], [place], [place] [address]   Urinalysis, Routine w reflex microscopic     Status: Abnormal   Collection Time: 05/31/18 11:17 PM  Result Value Ref Range   Color, Urine YELLOW YELLOW   APPearance HAZY (A) CLEAR   Specific Gravity, Urine 1.025 1.005 - 1.030   pH 5.0 5.0 - 8.0   Glucose, UA 50 (A) NEGATIVE mg/dL   Hgb urine dipstick SMALL (A) NEGATIVE   Bilirubin Urine NEGATIVE NEGATIVE   Ketones, ur 5 (A) NEGATIVE mg/dL   Protein, ur >=678 (A) NEGATIVE mg/dL   Nitrite NEGATIVE NEGATIVE   Leukocytes, UA NEGATIVE NEGATIVE   RBC / HPF 0-5 0 - 5 RBC/hpf   WBC, UA 11-20 0 - 5 WBC/hpf   Bacteria, UA RARE (A) NONE SEEN   Squamous Epithelial / LPF 0-5 0 - 5   Mucus PRESENT    Hyaline Casts, UA PRESENT    Amorphous Crystal PRESENT    Crystals PRESENT (A) NEGATIVE    Comment: Performed at [facility], [address], [place], [place] [address]  Lactic acid, plasma     Status: None   Collection Time: 05/31/18 11:28 PM  Result Value Ref Range   Lactic Acid, Venous 1.8 0.5 - 1.9 mmol/L    Comment: Performed at [facility], [address], [place], [place] [address]  Influenza panel by PCR (type A & B)     Status: Abnormal   Collection Time: 06/01/18  1:11 AM  Result Value Ref Range   Influenza A By PCR POSITIVE (A) NEGATIVE   Influenza B By PCR NEGATIVE NEGATIVE    Comment: (NOTE) The Xpert Xpress Flu assay is intended as an aid in the diagnosis of  influenza and should not be used as a sole basis for treatment.  This  assay is FDA approved for nasopharyngeal swab specimens only. Nasal  washings and aspirates are unacceptable for Xpert Xpress Flu testing. Performed at [facility], [address], [place], [place] [address]    Ct Head Wo Contrast  Result Date: 05/31/2018 CLINICAL DATA:  Status post fall twice today.  EXAM: CT HEAD WITHOUT CONTRAST TECHNIQUE:  Contiguous axial images were obtained from the base of the skull through the vertex without intravenous contrast. COMPARISON:  None. FINDINGS: Brain: No evidence of acute infarction, hemorrhage, hydrocephalus, extra-axial collection or mass lesion/mass effect. Vascular: No hyperdense vessel or unexpected calcification. Skull: No osseous abnormality. Sinuses/Orbits: Visualized paranasal sinuses are clear. Visualized mastoid sinuses are clear. Visualized orbits demonstrate no focal abnormality. Other: None IMPRESSION: No acute intracranial pathology. Electronically Signed   By: Elige KoHetal  Patel   On: 05/31/2018 22:19   Dg Chest Portable 1 View  Result Date: 06/01/2018 CLINICAL DATA:  Hypoxia EXAM: PORTABLE CHEST 1 VIEW COMPARISON:  12/26/2014 FINDINGS: The heart size and mediastinal contours are within normal limits. Both lungs are clear. The visualized skeletal structures are unremarkable. IMPRESSION: No active disease. Electronically Signed   By: Elige KoHetal  Patel   On: 06/01/2018 00:34   EKG Interpretation  Date/Time:  Tuesday May 31 2018 21:24:44 EST Ventricular Rate:  96 PR Interval:    QRS Duration: 102 QT Interval:  415 QTC Calculation: 525 R Axis:   9 Text Interpretation:  Sinus rhythm Prolonged QT interval TWI in III somewhat new, minor ST elevation in III without contiguous leads or reciprocal changes Confirmed by Marily MemosMesner, Jason [phone]([id number]) on 05/31/2018 9:34:26 PM   Pending Labs Unresulted Labs (From admission, onward)    Start     Ordered   05/31/18 2102  Levetiracetam level  Once,   R     05/31/18 2102   05/31/18 2101  Blood Culture (routine x 2)  BLOOD CULTURE X 2,   STAT     05/31/18 2101          Vitals/Pain Today's Vitals   05/31/18 2158 05/31/18 2221 05/31/18 2339 06/01/18 0100  BP:  (!) 156/75 (!) 157/71 (!) 152/62  Pulse: (!) 102 88 83 81  Resp:  17 18 18   Temp: (!) 101.9 F (38.8 C)     TempSrc: Rectal     SpO2:  98% 98% 99%  PainSc:        Isolation Precautions No  active isolations  Medications Medications  azithromycin (ZITHROMAX) 500 mg in sodium chloride 0.9 % 250 mL IVPB (500 mg Intravenous New Bag/Given 06/01/18 0137)  sodium chloride 0.9 % bolus 1,000 mL (0 mLs Intravenous Stopped 05/31/18 2348)  cefTRIAXone (ROCEPHIN) 1 g in sodium chloride 0.9 % 100 mL IVPB (0 g Intravenous Stopped 06/01/18 0103)  sodium chloride 0.9 % bolus 1,000 mL (0 mLs Intravenous Stopped 06/01/18 0136)    Mobility walks with person assist ( but falling lately )

## 2018-06-02 ENCOUNTER — Inpatient Hospital Stay (HOSPITAL_COMMUNITY): Payer: Medicare Other

## 2018-06-02 ENCOUNTER — Encounter (HOSPITAL_COMMUNITY): Payer: Self-pay

## 2018-06-02 DIAGNOSIS — R7881 Bacteremia: Secondary | ICD-10-CM

## 2018-06-02 DIAGNOSIS — F0280 Dementia in other diseases classified elsewhere without behavioral disturbance: Secondary | ICD-10-CM

## 2018-06-02 DIAGNOSIS — E1100 Type 2 diabetes mellitus with hyperosmolarity without nonketotic hyperglycemic-hyperosmolar coma (NKHHC): Secondary | ICD-10-CM

## 2018-06-02 DIAGNOSIS — G300 Alzheimer's disease with early onset: Secondary | ICD-10-CM

## 2018-06-02 LAB — BASIC METABOLIC PANEL
Anion gap: 7 (ref 5–15)
BUN: 9 mg/dL (ref 8–23)
CO2: 24 mmol/L (ref 22–32)
Calcium: 8.2 mg/dL — ABNORMAL LOW (ref 8.9–10.3)
Chloride: 104 mmol/L (ref 98–111)
Creatinine, Ser: 0.72 mg/dL (ref 0.44–1.00)
GFR calc Af Amer: >60 mL/min (ref >60)
GFR calc non Af Amer: >60 mL/min (ref >60)
Glucose, Bld: 139 mg/dL — ABNORMAL HIGH (ref 70–99)
Potassium: 3.5 mmol/L (ref 3.5–5.1)
Sodium: 135 mmol/L (ref 135–145)

## 2018-06-02 LAB — GLUCOSE, CAPILLARY
Glucose-Capillary: 120 mg/dL — ABNORMAL HIGH (ref 70–99)
Glucose-Capillary: 143 mg/dL — ABNORMAL HIGH (ref 70–99)
Glucose-Capillary: 133 mg/dL — ABNORMAL HIGH (ref 70–99)
Glucose-Capillary: 166 mg/dL — ABNORMAL HIGH (ref 70–99)

## 2018-06-02 LAB — ECHOCARDIOGRAM COMPLETE
Height: 68 in
Weight: 3548.52 oz

## 2018-06-02 LAB — URINE CULTURE

## 2018-06-02 MED ORDER — SACCHAROMYCES BOULARDII 250 MG PO CAPS
250.0000 mg | ORAL_CAPSULE | Freq: Two times a day (BID) | ORAL | Status: DC
  Administered 2018-06-02 – 2018-06-08 (×13): 250 mg via ORAL
  Filled 2018-06-02 (×13): qty 1

## 2018-06-02 MED ORDER — MAGNESIUM OXIDE 400 (241.3 MG) MG PO TABS
400.0000 mg | ORAL_TABLET | Freq: Every day | ORAL | Status: DC
  Administered 2018-06-02 – 2018-06-08 (×7): 400 mg via ORAL
  Filled 2018-06-02 (×7): qty 1

## 2018-06-02 MED ORDER — STROKE: EARLY STAGES OF RECOVERY BOOK
Freq: Once | Status: AC
  Administered 2018-06-03: 1
  Filled 2018-06-02: qty 1

## 2018-06-02 MED ORDER — POTASSIUM CHLORIDE CRYS ER 20 MEQ PO TBCR
40.0000 meq | EXTENDED_RELEASE_TABLET | Freq: Once | ORAL | Status: AC
  Administered 2018-06-02: 40 meq via ORAL
  Filled 2018-06-02: qty 2

## 2018-06-02 NOTE — Progress Notes (Addendum)
[facility] PROGRESS NOTE  Paula Reed UYQ:[id number] DOB: Dec 16, 1945 DOA: 05/31/2018   PCP: Latrelle DodrillMcIntyre, Brittany J, MD     Assessment/Plan: Principal Problem:   MRSA bacteremia Active Problems:   Diabetes mellitus, type II (HCC)   Hypertension   Hyperlipidemia   Probable Alzheimer's dementia without behavioral disturbance   Seizures (HCC)   Influenza A    73 y.o.female,with history of dementia, hyperlipidemia, hypertension, diabetes mellitus type 2, seizure disorder came to hospital after patient fell at home.   In the ED patient was found to be febrile with a temp of 101.9. She had mildly abnormal UA and started on ceftriaxone and Zithromax for possible UTI and questionable pneumonia. Her O2 sats were 91% on room air. Influenza PCR came back positive for influenza A  Assessment and plan 1. Influenza-patient presents with fever, influenza PCR positive for influenza A, patient empirically started on antibiotics to prevent superimposed infection but ID felt she has no evidence off pneumonia on chest x-ray and clinically. discontinued ceftriaxone and Zithromax. continueon Tamiflu 75 mg p.o. twice daily.  blood culture positive for gram-positive cocci, suspected MRSA.2-D echo to rule out vegetations. Patient started on vancomycin IV .ID following. Previously  MRSA PCR negative  .continues to be hypoxic and dyspneic on RA , continue as inpatient  2. ? UTI-patient has mildly abnormal UA, denies any symptoms.     follow urine culture.  3. Diabetes mellitus type 2-continue with Lantus 18 units subcu daily,  Continue sliding scale insulin with NovoLog.check hemoglobin A1c  4. Dementia-no behavior disturbance, continue Aricept. MRI of the brain to rule out CVA as the patient has been 2 recent falls  5. History of seizures-continue Keppra.  6. Prolonged QTc interval-QTC is prolonged to 525, will start cardiac monitoring. Magnesium 1.8   7. bruising-patient  noted to have bruising on her bilateral knees and lips. She is unable to provide any history regarding falls. Will obtain social work consult to ensure that she is in a safe living environment   8. Abnormal troponin-suspect secondary to demand ischemia in the setting of influenza/acute bronchitis. 2-D echo to rule out wall motion abnormalities.    DVT prophylaxsis Lovenox  Code Status:  Full code    Family Communication: Discussed in detail with the patient /son/husband, all imaging results, lab results explained to the patient   Disposition Plan: very unstable will need SNF       Consultants:  Infectious disease  Procedures:  none  Antibiotics: Anti-infectives (From admission, onward)   Start     Dose/Rate Route Frequency Ordered Stop   06/02/18 1700  vancomycin (VANCOCIN) 1,250 mg in sodium chloride 0.9 % 250 mL IVPB     1,250 mg 166.7 mL/hr over 90 Minutes Intravenous Every 24 hours 06/01/18 1655     06/01/18 1930  azithromycin (ZITHROMAX) 500 mg in sodium chloride 0.9 % 250 mL IVPB  Status:  Discontinued     500 mg 250 mL/hr over 60 Minutes Intravenous Every 24 hours 06/01/18 0335 06/01/18 1737   06/01/18 1800  cefTRIAXone (ROCEPHIN) 1 g in sodium chloride 0.9 % 100 mL IVPB  Status:  Discontinued     1 g 200 mL/hr over 30 Minutes Intravenous Every 24 hours 06/01/18 0335 06/01/18 1737   06/01/18 1700  vancomycin (VANCOCIN) 2,000 mg in sodium chloride 0.9 % 500 mL IVPB     2,000 mg 250 mL/hr over 120 Minutes Intravenous NOW 06/01/18 1655 06/01/18 2014   06/01/18  0345  oseltamivir (TAMIFLU) capsule 75 mg     75 mg Oral 2 times daily 06/01/18 0335 06/05/18 2159   05/31/18 2300  cefTRIAXone (ROCEPHIN) 1 g in sodium chloride 0.9 % 100 mL IVPB     1 g 200 mL/hr over 30 Minutes Intravenous  Once 05/31/18 2258 06/01/18 0103   05/31/18 2300  azithromycin (ZITHROMAX) 500 mg in sodium chloride 0.9 % 250 mL IVPB     500 mg 250 mL/hr over 60 Minutes Intravenous  Once 05/31/18  2258 06/01/18 0237         HPI/Subjective: Patient slightly more awake and alert and able to also questions and knows that she lives with her husband, complaining of bilateral lower extremity pain likely from the fall, she has not yet ambulated and will need physical therapy evaluation I have discussed in detail with the patient's son and niece by the bedside  Objective: Vitals:   06/01/18 1026 06/01/18 1403 06/01/18 2149 06/02/18 0334  BP: (!) 155/67 (!) 168/68 (!) 162/72 (!) 166/56  Pulse: 74 66 75 61  Resp: 16 (!) 25 18 20   Temp:  99.3 F (37.4 C) 99.3 F (37.4 C) 99 F (37.2 C)  TempSrc:  Oral Oral Oral  SpO2: 100% 100% 99% 92%  Weight:      Height:        Intake/Output Summary (Last 24 hours) at 06/02/2018 0849 Last data filed at 06/02/2018 0600 Gross per 24 hour  Intake 1024.21 ml  Output 750 ml  Net 274.21 ml    Exam:  Examination:  General exam: Appears calm and comfortable  Respiratory system: Clear to auscultation. Respiratory effort normal. Cardiovascular system: S1 & S2 heard, RRR. No JVD, murmurs, rubs, gallops or clicks. No pedal edema. Gastrointestinal system: Abdomen is nondistended, soft and nontender. No organomegaly or masses felt. Normal bowel sounds heard. Central nervous system: Alert  And oriented to self. No focal neurological deficits. Extremities: Symmetric 5 x 5 power. Skin: No rashes, lesions or ulcers Psychiatry: impaired    Data Reviewed: I have personally reviewed following labs and imaging studies  Micro Results Recent Results (from the past 240 hour(s))  Blood Culture (routine x 2)     Status: None (Preliminary result)   Collection Time: 05/31/18  9:41 PM  Result Value Ref Range Status   Specimen Description   Final    BLOOD RIGHT ANTECUBITAL Performed at [facility], [address] [place], [place] [address]    Special Requests   Final    BOTTLES DRAWN AEROBIC AND ANAEROBIC Blood Culture adequate  volume Performed at [facility], [address] [place], [place] [address]    Culture   Final    NO GROWTH < 24 HOURS Performed at [facility], [address], [place], [place] [address]    Report Status PENDING  Incomplete  Blood Culture (routine x 2)     Status: None (Preliminary result)   Collection Time: 05/31/18  9:59 PM  Result Value Ref Range Status   Specimen Description   Final    BLOOD LEFT HAND Performed at [facility], [address], [place], [place] [address]    Special Requests   Final    BOTTLES DRAWN AEROBIC AND ANAEROBIC Blood Culture adequate volume Performed at [facility], [address] [place], [place] [address]    Culture  Setup Time   Final    GRAM POSITIVE COCCI IN CLUSTERS IN  BOTH AEROBIC AND ANAEROBIC BOTTLES Organism ID to follow CRITICAL RESULT CALLED TO, READ BACK BY AND VERIFIED WITH: Jodi Marble PharmD 16:30 06/01/18 ([email or web address]) Performed at [facility], [address] [place], [place] [address]    Culture GRAM POSITIVE COCCI  Final   Report Status PENDING  Incomplete  Blood Culture ID Panel (Reflexed)     Status: Abnormal   Collection Time: 05/31/18  9:59 PM  Result Value Ref Range Status   Enterococcus species NOT DETECTED NOT DETECTED Final   Listeria monocytogenes NOT DETECTED NOT DETECTED Final   Staphylococcus species DETECTED (A) NOT DETECTED Final    Comment: CRITICAL RESULT CALLED TO, READ BACK BY AND VERIFIED WITH: Jodi Marble PharmD 16:30 06/01/18 ([email or web address])    Staphylococcus aureus (BCID) DETECTED (A) NOT DETECTED Final    Comment: Methicillin (oxacillin)-resistant Staphylococcus aureus (MRSA). MRSA is predictably resistant to beta-lactam antibiotics (except ceftaroline). Preferred therapy is vancomycin unless clinically contraindicated. Patient requires contact precautions if  hospitalized. CRITICAL RESULT CALLED TO, READ BACK BY AND VERIFIED WITH: Jodi Marble PharmD 16:30 06/01/18 ([email or web address])    Methicillin resistance DETECTED (A) NOT DETECTED Final    Comment: RESULT CALLED TO, READ BACK BY AND VERIFIED WITH: Jodi Marble PharmD 16:30 06/01/18 ([email or web address])    Streptococcus species NOT DETECTED NOT DETECTED Final   Streptococcus agalactiae NOT DETECTED NOT DETECTED Final   Streptococcus pneumoniae NOT DETECTED NOT DETECTED Final   Streptococcus pyogenes NOT DETECTED NOT DETECTED Final   Acinetobacter baumannii NOT DETECTED NOT DETECTED Final   Enterobacteriaceae species NOT DETECTED NOT DETECTED Final   Enterobacter cloacae complex NOT DETECTED NOT DETECTED Final   Escherichia coli NOT DETECTED NOT DETECTED Final   Klebsiella oxytoca NOT DETECTED NOT DETECTED Final   Klebsiella pneumoniae NOT DETECTED NOT DETECTED Final   Proteus species NOT DETECTED NOT DETECTED Final   Serratia marcescens NOT DETECTED NOT DETECTED Final   Haemophilus influenzae NOT DETECTED NOT DETECTED Final   Neisseria meningitidis NOT DETECTED NOT DETECTED Final   Pseudomonas aeruginosa NOT DETECTED NOT DETECTED Final   Candida albicans NOT DETECTED NOT DETECTED Final   Candida glabrata NOT DETECTED NOT DETECTED Final   Candida krusei NOT DETECTED NOT DETECTED Final   Candida parapsilosis NOT DETECTED NOT DETECTED Final   Candida tropicalis NOT DETECTED NOT DETECTED Final    Comment: Performed at [facility], [address] [place], [place] [address]    Radiology Reports Ct Head Wo Contrast  Result Date: 05/31/2018 CLINICAL DATA:  Status post fall twice today. EXAM: CT HEAD WITHOUT CONTRAST TECHNIQUE: Contiguous axial images were obtained from the base of the skull through the vertex without intravenous contrast. COMPARISON:  None. FINDINGS: Brain: No evidence of acute infarction, hemorrhage, hydrocephalus, extra-axial collection or mass lesion/mass effect. Vascular: No hyperdense vessel or unexpected calcification. Skull: No osseous abnormality.  Sinuses/Orbits: Visualized paranasal sinuses are clear. Visualized mastoid sinuses are clear. Visualized orbits demonstrate no focal abnormality. Other: None IMPRESSION: No acute intracranial pathology. Electronically Signed   By: Elige Ko   On: 05/31/2018 22:19   Dg Chest Portable 1 View  Result Date: 06/01/2018 CLINICAL DATA:  Hypoxia EXAM: PORTABLE CHEST 1 VIEW COMPARISON:  12/26/2014 FINDINGS: The heart size and mediastinal contours are within normal limits. Both lungs are clear. The visualized skeletal structures are unremarkable. IMPRESSION: No active disease. Electronically Signed   By: Elige Ko   On: 06/01/2018 00:34   Dg Finger Middle Left  Result Date: 05/10/2018 CLINICAL DATA:  73 year old female with pain third proximal interphalangeal joint space for the past week. No injury. Initial encounter. EXAM: LEFT MIDDLE FINGER 2+V COMPARISON:  None. FINDINGS: Question tiny erosion ulnar aspect left third middle phalanx proximal aspect. Otherwise negative plain film exam of the left third finger. IMPRESSION: Question tiny erosion ulnar aspect left third middle phalanx proximal aspect. Otherwise negative. Electronically Signed   By: Lacy DuverneySteven  Olson M.D.   On: 05/10/2018 17:50     CBC Recent Labs  Lab 05/31/18 2140 06/01/18 0911  WBC 13.0* 7.4  HGB 12.9 11.9*  HCT 41.1 39.6  PLT 268 224  MCV 89.5 93.0  MCH 28.1 27.9  MCHC 31.4 30.1  RDW 13.0 13.2  LYMPHSABS 0.5*  --   MONOABS 1.0  --   EOSABS 0.1  --   BASOSABS 0.0  --     Chemistries  Recent Labs  Lab 05/31/18 2140 06/01/18 0746 06/01/18 0911 06/02/18 0347  NA 133*  --  135 135  K 3.5  --  3.2* 3.5  CL 100  --  102 104  CO2 22  --  23 24  GLUCOSE 273*  --  217* 139*  BUN 15  --  10 9  CREATININE 1.17*  --  0.70 0.72  CALCIUM 9.1  --  8.4* 8.2*  MG  --  1.8  --   --   AST 41  --  59*  --   ALT 22  --  23  --   ALKPHOS 81  --  73  --   BILITOT 1.1  --  0.5  --     ------------------------------------------------------------------------------------------------------------------ estimated creatinine clearance is 80 mL/min (by C-G formula based on SCr of 0.72 mg/dL). ------------------------------------------------------------------------------------------------------------------ Recent Labs    06/01/18 0911  HGBA1C 9.0*   ------------------------------------------------------------------------------------------------------------------ No results for input(s): CHOL, HDL, LDLCALC, TRIG, CHOLHDL, LDLDIRECT in the last 72 hours. ------------------------------------------------------------------------------------------------------------------ No results for input(s): TSH, T4TOTAL, T3FREE, THYROIDAB in the last 72 hours.  Invalid input(s): FREET3 ------------------------------------------------------------------------------------------------------------------ No results for input(s): VITAMINB12, FOLATE, FERRITIN, TIBC, IRON, RETICCTPCT in the last 72 hours.  Coagulation profile No results for input(s): INR, PROTIME in the last 168 hours.  No results for input(s): DDIMER in the last 72 hours.  Cardiac Enzymes Recent Labs  Lab 05/31/18 2140 06/01/18 0746 06/01/18 1400  TROPONINI 0.03* 0.10* 0.06*   ------------------------------------------------------------------------------------------------------------------ Invalid input(s): POCBNP   CBG: Recent Labs  Lab 06/01/18 0753 06/01/18 1142 06/01/18 1644 06/01/18 2209 06/02/18 0802  GLUCAP 194* 200* 135* 109* 120*       Studies: Ct Head Wo Contrast  Result Date: 05/31/2018 CLINICAL DATA:  Status post fall twice today. EXAM: CT HEAD WITHOUT CONTRAST TECHNIQUE: Contiguous axial images were obtained from the base of the skull through the vertex without intravenous contrast. COMPARISON:  None. FINDINGS: Brain: No evidence of acute infarction, hemorrhage, hydrocephalus, extra-axial  collection or mass lesion/mass effect. Vascular: No hyperdense vessel or unexpected calcification. Skull: No osseous abnormality. Sinuses/Orbits: Visualized paranasal sinuses are clear. Visualized mastoid sinuses are clear. Visualized orbits demonstrate no focal abnormality. Other: None IMPRESSION: No acute intracranial pathology. Electronically Signed   By: Elige KoHetal  Patel   On: 05/31/2018 22:19   Dg Chest Portable 1 View  Result Date: 06/01/2018 CLINICAL DATA:  Hypoxia EXAM: PORTABLE CHEST 1 VIEW COMPARISON:  12/26/2014 FINDINGS: The heart size and mediastinal contours are within normal limits. Both lungs are clear. The visualized skeletal structures are unremarkable. IMPRESSION: No active disease. Electronically Signed   By: Alan RipperHetal  Patel   On: 06/01/2018 00:34      Lab Results  Component Value Date   HGBA1C 9.0 (H) 06/01/2018   HGBA1C 7.4 (A) 03/01/2018   HGBA1C 6.3 11/16/2017   Lab Results  Component Value Date   MICROALBUR 0.96 07/15/2010   LDLCALC 62 06/04/2016   CREATININE 0.72 06/02/2018       Scheduled Meds: . aspirin EC  81 mg Oral Daily  . donepezil  5 mg Oral QHS  . enoxaparin (LOVENOX) injection  40 mg Subcutaneous Daily  . guaiFENesin  600 mg Oral BID  . insulin aspart  0-9 Units Subcutaneous TID WC  . insulin glargine  18 Units Subcutaneous Daily  . levETIRAcetam  500 mg Oral BID  . loratadine  10 mg Oral Daily  . metoprolol tartrate  25 mg Oral BID  . oseltamivir  75 mg Oral BID   Continuous Infusions: . 0.9 % sodium chloride with kcl 50 mL/hr at 06/01/18 1718  . vancomycin       LOS: 1 day    Time spent: >30 MINS    Richarda Overlie  [facility] Pager [phone]. If 7PM-7AM, please contact night-coverage at [email or web address], password [facility] 06/02/2018, 8:49 AM  LOS: 1 day

## 2018-06-02 NOTE — Progress Notes (Signed)
Patient and Spouse agreeable to SNF-[facility]. Insurance Authorization started at [facility].   Vivi Barrack, [place], MSW Clinical Social Worker  [phone] 06/02/2018  3:09 PM

## 2018-06-02 NOTE — Progress Notes (Signed)
Patient ID: RENLEIGH MEDIC, female   DOB: [birth year], 73 y.o.   MRN: [id number]         [facility]  Date of Admission:  05/31/2018           Day 3 oseltamivir        Day 2 vancomycin ASSESSMENT: She has influenza A complicated by transient MRSA bacteremia.  She is improving on antimicrobial therapy.  Repeat blood cultures were drawn this morning.  She needs 2 more days of oseltamivir.  Optimal duration of IV vancomycin will depend on results of repeat cultures and echocardiography.  Will hold off on empiric placement until we know repeat blood cultures are negative.  PLAN: 1. Continue current antimicrobial therapy 2. Await results of repeat blood cultures and echocardiography  Principal Problem:   MRSA bacteremia Active Problems:   Influenza A   Diabetes mellitus, type II (HCC)   Hypertension   Hyperlipidemia   Probable Alzheimer's dementia without behavioral disturbance   Seizures (HCC)   Scheduled Meds: . aspirin EC  81 mg Oral Daily  . donepezil  5 mg Oral QHS  . enoxaparin (LOVENOX) injection  40 mg Subcutaneous Daily  . guaiFENesin  600 mg Oral BID  . insulin aspart  0-9 Units Subcutaneous TID WC  . insulin glargine  18 Units Subcutaneous Daily  . levETIRAcetam  500 mg Oral BID  . loratadine  10 mg Oral Daily  . magnesium oxide  400 mg Oral Daily  . metoprolol tartrate  25 mg Oral BID  . oseltamivir  75 mg Oral BID  . saccharomyces boulardii  250 mg Oral BID   Continuous Infusions: . 0.9 % sodium chloride with kcl 50 mL/hr at 06/01/18 1718  . vancomycin     PRN Meds:.acetaminophen **OR** acetaminophen, albuterol, ondansetron **OR** ondansetron (ZOFRAN) IV, sodium chloride flush   SUBJECTIVE: She says that she is feeling better.  She denies any cough or shortness of breath.  Review of Systems: Review of Systems  Constitutional: Negative for chills, diaphoresis and fever.  Respiratory: Negative for cough, sputum production and  shortness of breath.   Cardiovascular: Negative for chest pain.    Allergies  Allergen Reactions  . Chlorthalidone Rash    Per [facility] records, pt may have had a rash rxn to chlorthalidone    OBJECTIVE: Vitals:   06/01/18 1026 06/01/18 1403 06/01/18 2149 06/02/18 0334  BP: (!) 155/67 (!) 168/68 (!) 162/72 (!) 166/56  Pulse: 74 66 75 61  Resp: 16 (!) 25 18 20   Temp:  99.3 F (37.4 C) 99.3 F (37.4 C) 99 F (37.2 C)  TempSrc:  Oral Oral Oral  SpO2: 100% 100% 99% 92%  Weight:      Height:       Body mass index is 33.72 kg/m.  Physical Exam Constitutional:      Comments: She is more alert.  She is resting quietly in bed currently undergoing TTE.  Cardiovascular:     Rate and Rhythm: Normal rate and regular rhythm.     Heart sounds: No murmur.  Pulmonary:     Effort: Pulmonary effort is normal.     Breath sounds: Normal breath sounds.     Lab Results Lab Results  Component Value Date   WBC 7.4 06/01/2018   HGB 11.9 (L) 06/01/2018   HCT 39.6 06/01/2018   MCV 93.0 06/01/2018   PLT 224 06/01/2018    Lab Results  Component Value Date   CREATININE 0.72  06/02/2018   BUN 9 06/02/2018   NA 135 06/02/2018   K 3.5 06/02/2018   CL 104 06/02/2018   CO2 24 06/02/2018    Lab Results  Component Value Date   ALT 23 06/01/2018   AST 59 (H) 06/01/2018   ALKPHOS 73 06/01/2018   BILITOT 0.5 06/01/2018     Microbiology: Recent Results (from the past 240 hour(s))  Blood Culture (routine x 2)     Status: None (Preliminary result)   Collection Time: 05/31/18  9:41 PM  Result Value Ref Range Status   Specimen Description   Final    BLOOD RIGHT ANTECUBITAL Performed at [facility], [address] [place], [place] [address]    Special Requests   Final    BOTTLES DRAWN AEROBIC AND ANAEROBIC Blood Culture adequate volume Performed at [facility], [address] [place], [place] [address]    Culture   Final    NO GROWTH 2  DAYS Performed at [facility], [address] [place], [place] [address]    Report Status PENDING  Incomplete  Blood Culture (routine x 2)     Status: Abnormal (Preliminary result)   Collection Time: 05/31/18  9:59 PM  Result Value Ref Range Status   Specimen Description   Final    BLOOD LEFT HAND Performed at [facility], [address] [place], [place] [address]    Special Requests   Final    BOTTLES DRAWN AEROBIC AND ANAEROBIC Blood Culture adequate volume Performed at [facility], [address] [place], [place] [address]    Culture  Setup Time   Final    GRAM POSITIVE COCCI IN CLUSTERS IN BOTH AEROBIC AND ANAEROBIC BOTTLES CRITICAL RESULT CALLED TO, READ BACK BY AND VERIFIED WITH: Jodi MarbleL. Poindexter PharmD 16:30 06/01/18 ([email or web address]) Performed at [facility], [address] [place], [place] [address]    Culture STAPHYLOCOCCUS AUREUS (A)  Final   Report Status PENDING  Incomplete  Blood Culture ID Panel (Reflexed)     Status: Abnormal   Collection Time: 05/31/18  9:59 PM  Result Value Ref Range Status   Enterococcus species NOT DETECTED NOT DETECTED Final   Listeria monocytogenes NOT DETECTED NOT DETECTED Final   Staphylococcus species DETECTED (A) NOT DETECTED Final    Comment: CRITICAL RESULT CALLED TO, READ BACK BY AND VERIFIED WITH: Jodi MarbleL. Poindexter PharmD 16:30 06/01/18 ([email or web address])    Staphylococcus aureus (BCID) DETECTED (A) NOT DETECTED Final    Comment: Methicillin (oxacillin)-resistant Staphylococcus aureus (MRSA). MRSA is predictably resistant to beta-lactam antibiotics (except ceftaroline). Preferred therapy is vancomycin unless clinically contraindicated. Patient requires contact precautions if  hospitalized. CRITICAL RESULT CALLED TO, READ BACK BY AND VERIFIED WITH: Jodi MarbleL. Poindexter PharmD 16:30 06/01/18 (wilsonm)    Methicillin resistance DETECTED (A) NOT DETECTED Final    Comment: RESULT CALLED TO, READ BACK BY AND VERIFIED  WITH: Jodi MarbleL. Poindexter PharmD 16:30 06/01/18 (wilsonm)    Streptococcus species NOT DETECTED NOT DETECTED Final   Streptococcus agalactiae NOT DETECTED NOT DETECTED Final   Streptococcus pneumoniae NOT DETECTED NOT DETECTED Final   Streptococcus pyogenes NOT DETECTED NOT DETECTED Final   Acinetobacter baumannii NOT DETECTED NOT DETECTED Final   Enterobacteriaceae species NOT DETECTED NOT DETECTED Final   Enterobacter cloacae complex NOT DETECTED NOT DETECTED Final   Escherichia coli NOT DETECTED NOT DETECTED Final   Klebsiella oxytoca NOT DETECTED NOT DETECTED Final   Klebsiella pneumoniae NOT DETECTED NOT DETECTED  Final   Proteus species NOT DETECTED NOT DETECTED Final   Serratia marcescens NOT DETECTED NOT DETECTED Final   Haemophilus influenzae NOT DETECTED NOT DETECTED Final   Neisseria meningitidis NOT DETECTED NOT DETECTED Final   Pseudomonas aeruginosa NOT DETECTED NOT DETECTED Final   Candida albicans NOT DETECTED NOT DETECTED Final   Candida glabrata NOT DETECTED NOT DETECTED Final   Candida krusei NOT DETECTED NOT DETECTED Final   Candida parapsilosis NOT DETECTED NOT DETECTED Final   Candida tropicalis NOT DETECTED NOT DETECTED Final    Comment: Performed at [facility], [address], [place], [place] [address]  Culture, Urine     Status: Abnormal   Collection Time: 06/01/18  4:47 AM  Result Value Ref Range Status   Specimen Description   Final    URINE, CLEAN CATCH Performed at [facility], [address], [place], [place] [address]    Special Requests   Final    NONE Performed at [facility], [address], [place], [place] [address]    Culture MULTIPLE SPECIES PRESENT, SUGGEST RECOLLECTION (A)  Final   Report Status 06/02/2018 FINAL  Final    Cliffton Asters, MD [facility] [phone] pager   [phone] cell 06/02/2018, 10:09 AM

## 2018-06-02 NOTE — Clinical Social Work Note (Signed)
Clinical Social Work Assessment  Patient Details  Name: Paula Reed MRN: [id number] Date of Birth: June 06, 1945  Date of referral:  06/02/18               Reason for consult:  Discharge Planning                Permission sought to share information with:  Chartered certified accountant granted to share information::  Yes, Verbal Permission Granted  Name::        Agency::   [facility]   Relationship::   Spouse   Contact Information:     Housing/Transportation Living arrangements for the past 2 months:  [place] of Information:  Patient Patient Interpreter Needed:  None Criminal Activity/Legal Involvement Pertinent to Current Situation/Hospitalization:  No - Comment as needed Significant Relationships:  Siblings Lives with:  Spouse Do you feel safe going back to the place where you live?  Yes Need for family participation in patient care:  No (Coment)  Care giving concerns: Paula Reed  is a 73 y.o. female, with history of dementia, hyperlipidemia, hypertension, diabetes mellitus type 2, seizure disorder came to hospital after patient fell at home.   Social Worker assessment / plan:  CSW met with the patient at bedside, explain role and reason for visit. Patient and spouse agreeable to SNF for rehab. Patient spouse reports this is the first time the patient will need rehab. He prefers the patient go to [facility] for rehab as he is familiar with their services. Patient spouse assist the patient with bathing, dressing and meal prep. CSW explain SNF process and will follow up with bed offers.  FL2 completed.  PASRR completed.   Plan: SNF for rehab  Employment status:    Insurance information:  Medicare PT Recommendations:  [place] / Referral to community resources:  [place]  Patient/Family's Response to care:  Agreeable and Responding well to care  Patient/Family's  Understanding of and Emotional Response to Diagnosis, Current Treatment, and Prognosis: Patient spouse and son understand the patient current diagnosis and need for short rehab.   Emotional Assessment Appearance:  Appears stated age Attitude/Demeanor/Rapport:    Affect (typically observed):  Accepting Orientation:  Oriented to Self, Oriented to Place Alcohol / Substance use:  Not Applicable Psych involvement (Current and /or in the community):  No (Comment)  Discharge Needs  Concerns to be addressed:  Decision making concerns Readmission within the last 30 days:  No Current discharge risk:  Dependent with Mobility Barriers to Discharge:  Continued Medical Work up, Harper, [place] 06/02/2018, 12:04 PM

## 2018-06-02 NOTE — NC FL2 (Addendum)
[place] MEDICAID FL2 LEVEL OF CARE SCREENING TOOL     IDENTIFICATION  Patient Name: Paula Reed Birthdate: [birth year] Sex: female Admission Date (Current Location): 05/31/2018  [facility] and [place] Number:  Producer, television/film/video and Address:  [facility],  [address] [place] [address]      Provider Number: [id number]  Attending Physician Name and Address:  Richarda Overlie, MD  Relative Name and Phone Number:       Current Level of Care: Hospital Recommended Level of Care: Skilled Nursing Facility Prior Approval Number:    Date Approved/Denied:   PASRR Number: [id number]   Discharge Plan: SNF    Current Diagnoses: Patient Active Problem List   Diagnosis Date Noted  . Generalized weakness 06/01/2018  . Influenza A 06/01/2018  . MRSA bacteremia 06/01/2018  . Allergic rhinitis 05/12/2018  . Seizures (HCC) 06/28/2017  . Probable Alzheimer's dementia without behavioral disturbance 09/15/2015  . Rash and nonspecific skin eruption 12/18/2014  . Bruit of right carotid artery 03/26/2014  . Trigger finger 11/03/2012  . Heart murmur 04/22/2012  . Diabetes mellitus, type II (HCC) 02/02/2012  . Hypertension 02/02/2012  . Hyperlipidemia 02/02/2012  . Routine adult health maintenance 02/02/2012    Orientation RESPIRATION BLADDER Height & Weight     Self, Place  Normal Continent Weight: 221 lb 12.5 oz (100.6 kg) Height:  5\' 8"  (172.7 cm)  BEHAVIORAL SYMPTOMS/MOOD NEUROLOGICAL BOWEL NUTRITION STATUS      Continent Diet(Soft Diet )  AMBULATORY STATUS COMMUNICATION OF NEEDS Skin   Extensive Assist Verbally Normal                       Personal Care Assistance Level of Assistance  Bathing, Feeding, Dressing Bathing Assistance: Limited assistance Feeding assistance: Limited assistance Dressing Assistance: Maximum assistance     Functional Limitations Info  Sight, Hearing, Speech Sight Info: Impaired Hearing Info: Adequate Speech Info:  Adequate    SPECIAL CARE FACTORS FREQUENCY  PT (By licensed PT), OT (By licensed OT)     PT Frequency: 5x/week OT Frequency: 5x/week            Contractures Contractures Info: Not present    Additional Factors Info  Psychotropic, Insulin Sliding Scale Code Status Info: Fullcode  Allergies Info: Allergies: Chlorthalidone   Insulin Sliding Scale Info: Insulin        Current Medications (06/02/2018):  This is the current hospital active medication list Current Facility-Administered Medications  Medication Dose Route Frequency Provider Last Rate Last Dose  . acetaminophen (TYLENOL) tablet 650 mg  650 mg Oral Q6H PRN Meredeth Ide, MD       Or  . acetaminophen (TYLENOL) suppository 650 mg  650 mg Rectal Q6H PRN Sharl Ma, Sarina Ill, MD      . albuterol (PROVENTIL) (2.5 MG/3ML) 0.083% nebulizer solution 2.5 mg  2.5 mg Nebulization Q2H PRN Meredeth Ide, MD      . aspirin EC tablet 81 mg  81 mg Oral Daily Meredeth Ide, MD   81 mg at 06/02/18 1008  . donepezil (ARICEPT) tablet 5 mg  5 mg Oral QHS Meredeth Ide, MD   5 mg at 06/01/18 2149  . enoxaparin (LOVENOX) injection 40 mg  40 mg Subcutaneous Daily Meredeth Ide, MD   40 mg at 06/02/18 1007  . guaiFENesin (MUCINEX) 12 hr tablet 600 mg  600 mg Oral BID Meredeth Ide, MD   600 mg at 06/02/18 1008  .  insulin aspart (novoLOG) injection 0-9 Units  0-9 Units Subcutaneous TID WC Meredeth Ide, MD   1 Units at 06/01/18 1740  . insulin glargine (LANTUS) injection 18 Units  18 Units Subcutaneous Daily Richarda Overlie, MD   18 Units at 06/02/18 1053  . levETIRAcetam (KEPPRA) tablet 500 mg  500 mg Oral BID Meredeth Ide, MD   500 mg at 06/02/18 1008  . loratadine (CLARITIN) tablet 10 mg  10 mg Oral Daily Meredeth Ide, MD   10 mg at 06/02/18 1009  . magnesium oxide (MAG-OX) tablet 400 mg  400 mg Oral Daily Richarda Overlie, MD   400 mg at 06/02/18 1008  . metoprolol tartrate (LOPRESSOR) tablet 25 mg  25 mg Oral BID Meredeth Ide, MD   25 mg at 06/02/18  1008  . ondansetron (ZOFRAN) tablet 4 mg  4 mg Oral Q6H PRN Meredeth Ide, MD       Or  . ondansetron (ZOFRAN) injection 4 mg  4 mg Intravenous Q6H PRN Meredeth Ide, MD      . oseltamivir (TAMIFLU) capsule 75 mg  75 mg Oral BID Meredeth Ide, MD   75 mg at 06/02/18 1008  . saccharomyces boulardii (FLORASTOR) capsule 250 mg  250 mg Oral BID Richarda Overlie, MD   250 mg at 06/02/18 1008  . sodium chloride 0.9 % 1,000 mL with potassium chloride 60 mEq infusion   Intravenous Continuous Richarda Overlie, MD 50 mL/hr at 06/01/18 1718    . sodium chloride flush (NS) 0.9 % injection 10-40 mL  10-40 mL Intracatheter PRN Richarda Overlie, MD      . vancomycin (VANCOCIN) 1,250 mg in sodium chloride 0.9 % 250 mL IVPB  1,250 mg Intravenous Q24H Pham, Dyann Ruddle, RPH         Discharge Medications: Please see discharge summary for a list of discharge medications.  Relevant Imaging Results:  Relevant Lab Results:   Additional Information [id number]  Clearance Coots, LCSW

## 2018-06-02 NOTE — Evaluation (Signed)
Occupational Therapy Evaluation Patient Details Name: Paula Reed MRN: [id number] DOB: 1945-06-17 Today's Date: 06/02/2018    History of Present Illness 73 yo female admitted to ED on 2/4 with falls x2 and fatigue. Pt with medical diagnoses of Influenza A and MRSA bacteremia. PMH includes DMII, HLD, HTN, seizures, alzheimer's dementia, heart murmur. CT - for acute trauma.    Clinical Impression   Pt was admitted for the above. Per chart, pt recently needed increased assistance for adls from husband.  Pt was unable to stand with total A +1--will follow in acute setting focusing on toilet transfers and standing for adls to decrease burden of care.  Pt will benefit from SNF prior to home    Follow Up Recommendations  SNF    Equipment Recommendations  (defer to next venue)    Recommendations for Other Services       Precautions / Restrictions Precautions Precautions: Fall Restrictions Weight Bearing Restrictions: No      Mobility Bed Mobility     Rolling: Min assist;Mod assist(min A to R; mod A to L)   Supine to sit: Mod assist;Max assist Sit to supine: Max assist;+2 for physical assistance   General bed mobility comments: assist for legs and trunk to sit up; asked NT to assist with lying back down  Transfers                 General transfer comment: attempted to stand with RW with total A. Pt was not putting forth any effort--unable    Balance     Sitting balance-Leahy Scale: Fair                                     ADL either performed or assessed with clinical judgement   ADL Overall ADL's : Needs assistance/impaired Eating/Feeding: Set up   Grooming: Set up   Upper Body Bathing: Minimal assistance   Lower Body Bathing: Total assistance;Bed level;+2 for physical assistance   Upper Body Dressing : Moderate assistance   Lower Body Dressing: Total assistance;Bed level;+2 for physical assistance                 General ADL  Comments: sat EOB; could not stand with multiple attempts.  Pt was not putting forth any effort. She kept scooting towards edge of bed     Vision         Perception     Praxis      Pertinent Vitals/Pain Pain Assessment: Faces Faces Pain Scale: Hurts even more Pain Location: R kneecap Pain Descriptors / Indicators: Sore;Moaning Pain Intervention(s): Limited activity within patient's tolerance;Monitored during session;Repositioned     Hand Dominance Right   Extremity/Trunk Assessment Upper Extremity Assessment Upper Extremity Assessment: Generalized weakness           Communication Communication Communication: No difficulties   Cognition Arousal/Alertness: Awake/alert Behavior During Therapy: WFL for tasks assessed/performed Overall Cognitive Status:h/o dementia.  Unsure if cognition has declined Area of Impairment: Problem solving;Memory;Safety/judgement                         Safety/Judgement: Decreased awareness of deficits;Decreased awareness of safety   Problem Solving: Slow processing;Decreased initiation;Difficulty sequencing;Requires verbal cues;Requires tactile cues     General Comments       Exercises     Shoulder Instructions      Home Living Family/patient expects to be  discharged to:: Skilled nursing facility                                 Additional Comments: was at home with husband's assist.        Prior Functioning/Environment Level of Independence: Needs assistance        Comments: was mod I until recently; husband has been assisting just prior to admission        OT Problem List: Decreased strength;Decreased activity tolerance;Impaired balance (sitting and/or standing);Decreased knowledge of use of DME or AE;Decreased knowledge of precautions;Pain;Decreased cognition      OT Treatment/Interventions: Self-care/ADL training;DME and/or AE instruction;Balance training;Patient/family education;Therapeutic  activities    OT Goals(Current goals can be found in the care plan section) Acute Rehab OT Goals Patient Stated Goal: get stronger  OT Goal Formulation: With patient Time For Goal Achievement: 06/16/18 Potential to Achieve Goals: Fair ADL Goals Pt Will Transfer to Toilet: with mod assist;bedside commode;stand pivot transfer Additional ADL Goal #1: pt will stand with mod A and maintain x 2 minutes with min A for adls Additional ADL Goal #2: pt will perform UB adls from seated with set up, and no more than 1 vc to continue activity  OT Frequency: Min 2X/week   Barriers to D/C:            Co-evaluation              AM-PAC OT "6 Clicks" Daily Activity     Outcome Measure Help from another person eating meals?: A Little Help from another person taking care of personal grooming?: A Little Help from another person toileting, which includes using toliet, bedpan, or urinal?: Total Help from another person bathing (including washing, rinsing, drying)?: A Lot Help from another person to put on and taking off regular upper body clothing?: A Lot Help from another person to put on and taking off regular lower body clothing?: Total 6 Click Score: 12   End of Session    Activity Tolerance: Patient tolerated treatment well Patient left: in bed;with call bell/phone within reach;with bed alarm set;with nursing/sitter in room  OT Visit Diagnosis: Muscle weakness (generalized) (M62.81);History of falling (Z91.81);Pain Pain - Right/Left: Right Pain - part of body: Knee                Time: [id number] OT Time Calculation (min): 34 min Charges:  OT General Charges $OT Visit: 1 Visit OT Evaluation $OT Eval Low Complexity: 1 Low OT Treatments $Therapeutic Activity: 8-22 mins  Paula Reed, OTR/L Acute Rehabilitation Services [phone] [facility] pager [phone] office 06/02/2018  Paula Reed 06/02/2018, 3:26 PM

## 2018-06-02 NOTE — Progress Notes (Signed)
Follow up: Notified by bedside RN regarding results of MRI brain w/o cm this evening. 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 No associated hemorrhage or mass effect. RN relayed that husband admits that pt has not been herself for > a week, and there is documented h/o approx 2 falls at home this week. Discussed findings w/ Dr Amada Jupiter w/ neurology service who recommended adding the TIA focused order-set w/ a few changes given elapsed time. He does not feel pt will require transfer to [facility] and states someone from neurology team will see pt in am. Additions to plan discussed w/ Chloe, RN. Pt will remain on telemetry floor and will continue to be closely monitored.   Leanne Chang, NP-C [facility] Pager [phone]

## 2018-06-03 ENCOUNTER — Inpatient Hospital Stay (HOSPITAL_COMMUNITY): Payer: Medicare Other

## 2018-06-03 DIAGNOSIS — R7881 Bacteremia: Secondary | ICD-10-CM

## 2018-06-03 DIAGNOSIS — I639 Cerebral infarction, unspecified: Secondary | ICD-10-CM

## 2018-06-03 LAB — COMPREHENSIVE METABOLIC PANEL
ALT: 21 U/L (ref 0–44)
AST: 42 U/L — ABNORMAL HIGH (ref 15–41)
Albumin: 3 g/dL — ABNORMAL LOW (ref 3.5–5.0)
Alkaline Phosphatase: 60 U/L (ref 38–126)
Anion gap: 5 (ref 5–15)
BUN: 10 mg/dL (ref 8–23)
CO2: 25 mmol/L (ref 22–32)
Calcium: 8.4 mg/dL — ABNORMAL LOW (ref 8.9–10.3)
Chloride: 106 mmol/L (ref 98–111)
Creatinine, Ser: 0.7 mg/dL (ref 0.44–1.00)
GFR calc Af Amer: >60 mL/min (ref >60)
GFR calc non Af Amer: >60 mL/min (ref >60)
Glucose, Bld: 168 mg/dL — ABNORMAL HIGH (ref 70–99)
Potassium: 3.9 mmol/L (ref 3.5–5.1)
Sodium: 136 mmol/L (ref 135–145)
Total Bilirubin: 0.4 mg/dL (ref 0.3–1.2)
Total Protein: 6.5 g/dL (ref 6.5–8.1)

## 2018-06-03 LAB — CBC
HCT: 37.6 % (ref 36.0–46.0)
Hemoglobin: 11.4 g/dL — ABNORMAL LOW (ref 12.0–15.0)
MCH: 27.5 pg (ref 26.0–34.0)
MCHC: 30.3 g/dL (ref 30.0–36.0)
MCV: 90.8 fL (ref 80.0–100.0)
Platelets: 224 10*3/uL (ref 150–400)
RBC: 4.14 MIL/uL (ref 3.87–5.11)
RDW: 13.3 % (ref 11.5–15.5)
WBC: 3.1 10*3/uL — ABNORMAL LOW (ref 4.0–10.5)
nRBC: 0 % (ref 0.0–0.2)

## 2018-06-03 LAB — GLUCOSE, CAPILLARY
Glucose-Capillary: 157 mg/dL — ABNORMAL HIGH (ref 70–99)
Glucose-Capillary: 122 mg/dL — ABNORMAL HIGH (ref 70–99)
Glucose-Capillary: 112 mg/dL — ABNORMAL HIGH (ref 70–99)
Glucose-Capillary: 125 mg/dL — ABNORMAL HIGH (ref 70–99)

## 2018-06-03 LAB — HEMOGLOBIN A1C
Hgb A1c MFr Bld: 8.9 % — ABNORMAL HIGH (ref 4.8–5.6)
Mean Plasma Glucose: 208.73 mg/dL

## 2018-06-03 LAB — CULTURE, BLOOD (ROUTINE X 2): Special Requests: ADEQUATE

## 2018-06-03 LAB — LEVETIRACETAM LEVEL: Levetiracetam Lvl: 22.7 ug/mL (ref 10.0–40.0)

## 2018-06-03 LAB — LIPID PANEL
Cholesterol: 140 mg/dL (ref 0–200)
HDL: <10 mg/dL — ABNORMAL LOW (ref >40)
Triglycerides: 363 mg/dL — ABNORMAL HIGH (ref <150)
VLDL: 73 mg/dL — ABNORMAL HIGH (ref 0–40)

## 2018-06-03 MED ORDER — SODIUM CHLORIDE 0.9 % IV SOLN
INTRAVENOUS | Status: DC
  Administered 2018-06-06: 19:00:00 via INTRAVENOUS

## 2018-06-03 MED ORDER — ATORVASTATIN CALCIUM 40 MG PO TABS
80.0000 mg | ORAL_TABLET | Freq: Every day | ORAL | Status: DC
  Administered 2018-06-03 – 2018-06-07 (×5): 80 mg via ORAL
  Filled 2018-06-03 (×5): qty 2

## 2018-06-03 MED ORDER — POTASSIUM CHLORIDE CRYS ER 20 MEQ PO TBCR
40.0000 meq | EXTENDED_RELEASE_TABLET | Freq: Once | ORAL | Status: AC
  Administered 2018-06-03: 40 meq via ORAL
  Filled 2018-06-03: qty 2

## 2018-06-03 MED ORDER — SODIUM CHLORIDE (PF) 0.9 % IJ SOLN
INTRAMUSCULAR | Status: AC
  Filled 2018-06-03: qty 50

## 2018-06-03 MED ORDER — SODIUM CHLORIDE 0.9 % IV SOLN
INTRAVENOUS | Status: DC
  Administered 2018-06-03: 22:00:00 via INTRAVENOUS
  Filled 2018-06-03 (×2): qty 1000

## 2018-06-03 MED ORDER — IOPAMIDOL (ISOVUE-370) INJECTION 76%
100.0000 mL | Freq: Once | INTRAVENOUS | Status: AC | PRN
  Administered 2018-06-03: 100 mL via INTRAVENOUS

## 2018-06-03 MED ORDER — ASPIRIN 325 MG PO TABS
325.0000 mg | ORAL_TABLET | Freq: Every day | ORAL | Status: DC
  Administered 2018-06-03 – 2018-06-08 (×6): 325 mg via ORAL
  Filled 2018-06-03 (×6): qty 1

## 2018-06-03 MED ORDER — IOPAMIDOL (ISOVUE-370) INJECTION 76%
INTRAVENOUS | Status: AC
  Filled 2018-06-03: qty 100

## 2018-06-03 MED ORDER — SODIUM CHLORIDE 0.9 % IV SOLN: INTRAVENOUS | Status: DC

## 2018-06-03 NOTE — Progress Notes (Addendum)
[facility] PROGRESS NOTE  Paula Reed ZOX:[id number] DOB: 1946/04/01 DOA: 05/31/2018   PCP: Latrelle Dodrill, MD     Assessment/Plan: Principal Problem:   MRSA bacteremia Active Problems:   Diabetes mellitus, type II (HCC)   Hypertension   Hyperlipidemia   Probable Alzheimer's dementia without behavioral disturbance   Seizures (HCC)   Influenza A    73 y.o.female,with history of dementia, hyperlipidemia, hypertension, diabetes mellitus type 2, seizure disorder came to hospital after patient fell at home.  In the ED patient was found to be febrile with a temp of 101.9. She had mildly abnormal UA and started on ceftriaxone and Zithromax for possible UTI and questionable pneumonia. Her O2 sats were 91% on room air.Influenza PCR came back positive for influenza A. Patient also found to have acute/subacute stroke   Assessment and plan 1. MRSA sepsis/Influenza A-patient presents with fever, influenza PCR positive for influenza A, patient empirically started on antibiotics to prevent superimposed infection but ID felt she has no evidence off pneumonia on chest x-ray and clinically. discontinued ceftriaxone and Zithromax. continueon Tamiflu 75 mg p.o. twice daily.  blood culture positive for gram-positive cocci,  MRSA. Source unclear. 2-D echo without vegetations. ID recommends TEE, placed cardiology consult and notified fellow on call. Patient started on vancomycin IV .ID will determine duration of treatment. Repeat blood cultures from 2/ 6 no growth so far. Ok to place PICC if remains negative in am, for continuation of iv vancomycin.  . Gradually improving , hypoxia resolving .  2. ? UTI-patient has mildly abnormal UA, denies any symptoms.    urine culture nonspecific  3. Diabetes mellitus type 2-continue with Lantus 18 units subcu daily,  Continue sliding scale insulin with NovoLog. hemoglobin A1c 8.9. Triglycerides 363, LDL not calculated, HDL less than  10   4. Dementia-no behavior disturbance, continue Aricept.    5. Acute CVA MRI of the brain  Confirmed acute/subacute infarction . Neurology notified by NP last night, he recommended focused TIA order set. Increase aspirin to 325 mg a day. Started  patient on a statin Lipitor 80 mg daily. Telemetry showed normal sinus rhythm.discuss with Dr.Arora, he recommend CTA head and neck. He does not see any indication to transfer patient to [facility]. Patient  seen by PT OT speech . PT recommends SNF. Follow-up with outpatient neurology stroke clinic at [facility] neurology in 4 to 6 weeks after discharge  6. History of seizures-continue Keppra   7. Prolonged QTc interval-QTC is prolonged to 525, will start cardiac monitoring. Magnesium 1.8 . Magnesium oxide   8. Fall/ bruising-patient noted to have bruising on her bilateral knees and lips. She is unable to provide any history regarding falls.  Patient's husband is debilitated and unable to take care of her. She will need SNF   9. Abnormal troponin-suspect secondary to demand ischemia in the setting of influenza/acute bronchitis/sepsis/CVA . 2-D echo did not show any significant   wall motion abnormalities.    DVT prophylaxsis Lovenox  Code Status:  Full code    Family Communication: Discussed in detail with the patient /son/husband, all imaging results, lab results explained to the patient   Disposition Plan: SNF Monday      Consultants:  Infectious disease  Procedures:  none  Antibiotics: Anti-infectives (From admission, onward)   Start     Dose/Rate Route Frequency Ordered Stop   06/02/18 1700  vancomycin (VANCOCIN) 1,250 mg in sodium chloride 0.9 % 250 mL IVPB  1,250 mg 166.7 mL/hr over 90 Minutes Intravenous Every 24 hours 06/01/18 1655     06/01/18 1930  azithromycin (ZITHROMAX) 500 mg in sodium chloride 0.9 % 250 mL IVPB  Status:  Discontinued     500 mg 250 mL/hr over 60 Minutes Intravenous Every 24 hours 06/01/18 0335  06/01/18 1737   06/01/18 1800  cefTRIAXone (ROCEPHIN) 1 g in sodium chloride 0.9 % 100 mL IVPB  Status:  Discontinued     1 g 200 mL/hr over 30 Minutes Intravenous Every 24 hours 06/01/18 0335 06/01/18 1737   06/01/18 1700  vancomycin (VANCOCIN) 2,000 mg in sodium chloride 0.9 % 500 mL IVPB     2,000 mg 250 mL/hr over 120 Minutes Intravenous NOW 06/01/18 1655 06/01/18 2014   06/01/18 0345  oseltamivir (TAMIFLU) capsule 75 mg     75 mg Oral 2 times daily 06/01/18 0335 06/05/18 2159   05/31/18 2300  cefTRIAXone (ROCEPHIN) 1 g in sodium chloride 0.9 % 100 mL IVPB     1 g 200 mL/hr over 30 Minutes Intravenous  Once 05/31/18 2258 06/01/18 0103   05/31/18 2300  azithromycin (ZITHROMAX) 500 mg in sodium chloride 0.9 % 250 mL IVPB     500 mg 250 mL/hr over 60 Minutes Intravenous  Once 05/31/18 2258 06/01/18 0237         HPI/Subjective: Patient slightly more awake and alert in working with physical therapy.  Denies any chest pain shortness of breath nausea vomiting abdominal pain  Objective: Vitals:   06/02/18 0334 06/02/18 1452 06/02/18 2055 06/03/18 0558  BP: (!) 166/56 (!) 159/72 (!) 136/52 (!) 171/67  Pulse: 61 62 67 66  Resp: 20 20 (!) 22 15  Temp: 99 F (37.2 C) 97.9 F (36.6 C) 98.6 F (37 C) 98.5 F (36.9 C)  TempSrc: Oral Oral Oral Oral  SpO2: 92%  95% 96%  Weight:      Height:        Intake/Output Summary (Last 24 hours) at 06/03/2018 0859 Last data filed at 06/03/2018 0745 Gross per 24 hour  Intake 1586.57 ml  Output 2100 ml  Net -513.43 ml    Exam:  Examination:  General exam: Appears calm and comfortable  Respiratory system: Clear to auscultation. Respiratory effort normal. Cardiovascular system: S1 & S2 heard, RRR. No JVD, murmurs, rubs, gallops or clicks. No pedal edema. Gastrointestinal system: Abdomen is nondistended, soft and nontender. No organomegaly or masses felt. Normal bowel sounds heard. Central nervous system: Alert  And oriented to self. No focal  neurological deficits. Extremities: Symmetric 5 x 5 power. Skin: bruising on bilateral lower extremities, lips Psychiatry: impaired    Data Reviewed: I have personally reviewed following labs and imaging studies  Micro Results Recent Results (from the past 240 hour(s))  Blood Culture (routine x 2)     Status: None (Preliminary result)   Collection Time: 05/31/18  9:41 PM  Result Value Ref Range Status   Specimen Description   Final    BLOOD RIGHT ANTECUBITAL Performed at [facility] [address] [place], [place] [address]    Special Requests   Final    BOTTLES DRAWN AEROBIC AND ANAEROBIC Blood Culture adequate volume Performed at [facility], [address], [place], [place] [address]    Culture   Final    NO GROWTH 3 DAYS Performed at [facility], [address] [place], [place] [address]    Report Status PENDING  Incomplete  Blood  Culture (routine x 2)     Status: Abnormal   Collection Time: 05/31/18  9:59 PM  Result Value Ref Range Status   Specimen Description   Final    BLOOD LEFT HAND Performed at [facility], [address] [place], [place] [address]    Special Requests   Final    BOTTLES DRAWN AEROBIC AND ANAEROBIC Blood Culture adequate volume Performed at [facility], [address], [place], [place] [address]    Culture  Setup Time   Final    GRAM POSITIVE COCCI IN CLUSTERS IN BOTH AEROBIC AND ANAEROBIC BOTTLES CRITICAL RESULT CALLED TO, READ BACK BY AND VERIFIED WITH: Jodi Marble PharmD 16:30 06/01/18 ([email or web address]) Performed at [facility], [address], [place], [place] [address]    Culture STAPHYLOCOCCUS AUREUS (A)  Final   Report Status 06/03/2018 FINAL  Final   Organism ID, Bacteria STAPHYLOCOCCUS AUREUS  Final      Susceptibility   Staphylococcus aureus - MIC*    CIPROFLOXACIN <=0.5 SENSITIVE Sensitive     ERYTHROMYCIN >=8 RESISTANT Resistant      GENTAMICIN <=0.5 SENSITIVE Sensitive     OXACILLIN <=0.25 SENSITIVE Sensitive     TETRACYCLINE <=1 SENSITIVE Sensitive     VANCOMYCIN 1 SENSITIVE Sensitive     TRIMETH/SULFA <=10 SENSITIVE Sensitive     CLINDAMYCIN <=0.25 SENSITIVE Sensitive     RIFAMPIN <=0.5 SENSITIVE Sensitive     Inducible Clindamycin NEGATIVE Sensitive     * STAPHYLOCOCCUS AUREUS  Blood Culture ID Panel (Reflexed)     Status: Abnormal   Collection Time: 05/31/18  9:59 PM  Result Value Ref Range Status   Enterococcus species NOT DETECTED NOT DETECTED Final   Listeria monocytogenes NOT DETECTED NOT DETECTED Final   Staphylococcus species DETECTED (A) NOT DETECTED Final    Comment: CRITICAL RESULT CALLED TO, READ BACK BY AND VERIFIED WITH: Jodi Marble PharmD 16:30 06/01/18 ([email or web address])    Staphylococcus aureus (BCID) DETECTED (A) NOT DETECTED Final    Comment: Methicillin (oxacillin)-resistant Staphylococcus aureus (MRSA). MRSA is predictably resistant to beta-lactam antibiotics (except ceftaroline). Preferred therapy is vancomycin unless clinically contraindicated. Patient requires contact precautions if  hospitalized. CRITICAL RESULT CALLED TO, READ BACK BY AND VERIFIED WITH: Jodi Marble PharmD 16:30 06/01/18 ([email or web address])    Methicillin resistance DETECTED (A) NOT DETECTED Final    Comment: RESULT CALLED TO, READ BACK BY AND VERIFIED WITH: Jodi Marble PharmD 16:30 06/01/18 ([email or web address])    Streptococcus species NOT DETECTED NOT DETECTED Final   Streptococcus agalactiae NOT DETECTED NOT DETECTED Final   Streptococcus pneumoniae NOT DETECTED NOT DETECTED Final   Streptococcus pyogenes NOT DETECTED NOT DETECTED Final   Acinetobacter baumannii NOT DETECTED NOT DETECTED Final   Enterobacteriaceae species NOT DETECTED NOT DETECTED Final   Enterobacter cloacae complex NOT DETECTED NOT DETECTED Final   Escherichia coli NOT DETECTED NOT DETECTED Final   Klebsiella oxytoca NOT DETECTED NOT DETECTED Final   Klebsiella pneumoniae  NOT DETECTED NOT DETECTED Final   Proteus species NOT DETECTED NOT DETECTED Final   Serratia marcescens NOT DETECTED NOT DETECTED Final   Haemophilus influenzae NOT DETECTED NOT DETECTED Final   Neisseria meningitidis NOT DETECTED NOT DETECTED Final   Pseudomonas aeruginosa NOT DETECTED NOT DETECTED Final   Candida albicans NOT DETECTED NOT DETECTED Final   Candida glabrata NOT DETECTED NOT DETECTED Final   Candida krusei NOT DETECTED NOT DETECTED Final   Candida parapsilosis NOT DETECTED NOT DETECTED Final   Candida tropicalis  NOT DETECTED NOT DETECTED Final    Comment: Performed at [facility], [address], [place], [place] [address]  Culture, Urine     Status: Abnormal   Collection Time: 06/01/18  4:47 AM  Result Value Ref Range Status   Specimen Description   Final    URINE, CLEAN CATCH Performed at [facility], [address] [place], [place] [address]    Special Requests   Final    NONE Performed at [facility], [address] [place], [place] [address]    Culture MULTIPLE SPECIES PRESENT, SUGGEST RECOLLECTION (A)  Final   Report Status 06/02/2018 FINAL  Final  Culture, blood (routine x 2)     Status: None (Preliminary result)   Collection Time: 06/02/18  8:03 AM  Result Value Ref Range Status   Specimen Description   Final    BLOOD RIGHT HAND Performed at [facility], [address] [place], [place] [address]    Special Requests   Final    BOTTLES DRAWN AEROBIC ONLY Blood Culture results may not be optimal due to an inadequate volume of blood received in culture bottles Performed at [facility], [address], [place], [place] [address]    Culture   Final    NO GROWTH < 24 HOURS Performed at [facility], [address], [place], [place] [address]    Report Status PENDING  Incomplete  Culture, blood (routine x 2)     Status: None (Preliminary result)   Collection Time:  06/02/18  8:03 AM  Result Value Ref Range Status   Specimen Description   Final    BLOOD RIGHT HAND Performed at [facility], [address], [place], [place] [address]    Special Requests   Final    BOTTLES DRAWN AEROBIC ONLY Blood Culture adequate volume Performed at [facility], [address], [place], [place] [address]    Culture   Final    NO GROWTH < 24 HOURS Performed at [facility], [address], [place], [place] [address]    Report Status PENDING  Incomplete    Radiology Reports Ct Head Wo Contrast  Result Date: 05/31/2018 CLINICAL DATA:  Status post fall twice today. EXAM: CT HEAD WITHOUT CONTRAST TECHNIQUE: Contiguous axial images were obtained from the base of the skull through the vertex without intravenous contrast. COMPARISON:  None. FINDINGS: Brain: No evidence of acute infarction, hemorrhage, hydrocephalus, extra-axial collection or mass lesion/mass effect. Vascular: No hyperdense vessel or unexpected calcification. Skull: No osseous abnormality. Sinuses/Orbits: Visualized paranasal sinuses are clear. Visualized mastoid sinuses are clear. Visualized orbits demonstrate no focal abnormality. Other: None IMPRESSION: No acute intracranial pathology. Electronically Signed   By: Elige Ko   On: 05/31/2018 22:19   Mr Brain Wo Contrast  Result Date: 06/02/2018 CLINICAL DATA:  73 y/o F; altered mental status, likely secondary to ETOH/substance abuse. History of early dementia. EXAM: MRI HEAD WITHOUT CONTRAST TECHNIQUE: Multiplanar, multiecho pulse sequences of the brain and surrounding structures were obtained without intravenous contrast. COMPARISON:  05/31/2018 CT head.  09/19/2015 MRI head. FINDINGS: Brain: Punctate focus of reduced diffusion within right hemi pons (series 3, image 22 and series 5, image 20) compatible with acute/early subacute infarction. No hemorrhage or mass effect. Small chronic infarcts are present within the  left cerebellum and left thalamus. Punctate nonspecific T2 FLAIR hyperintensities in subcortical and periventricular white matter are compatible with  mild chronic microvascular ischemic changes. Mild volume loss of the brain. Punctate focus of chronic microhemorrhage within left posterior temporal lobe. No extra-axial collection, hydrocephalus, mass effect, or herniation. Partially empty sella turcica. Vascular: Normal flow voids. Skull and upper cervical spine: Normal marrow signal. Sinuses/Orbits: Moderate diffuse paranasal sinus disease. No abnormal signal of mastoid air cells. Orbits are unremarkable. Other: None. IMPRESSION: 1. Punctate focus of acute/early subacute infarction within the right hemi pons. No associated hemorrhage or mass effect. 2. Mild chronic microvascular ischemic changes and volume loss of the brain. Small chronic infarcts within left pons and left thalamus. 3. Partially empty sella turcica. 4. Moderate diffuse paranasal sinus disease. These results will be called to the ordering clinician or representative by the Radiologist Assistant, and communication documented in the PACS or zVision Dashboard. Electronically Signed   By: Mitzi HansenLance  Furusawa-Stratton M.D.   On: 06/02/2018 22:15   Dg Chest Portable 1 View  Result Date: 06/01/2018 CLINICAL DATA:  Hypoxia EXAM: PORTABLE CHEST 1 VIEW COMPARISON:  12/26/2014 FINDINGS: The heart size and mediastinal contours are within normal limits. Both lungs are clear. The visualized skeletal structures are unremarkable. IMPRESSION: No active disease. Electronically Signed   By: Elige KoHetal  Patel   On: 06/01/2018 00:34   Dg Finger Middle Left  Result Date: 05/10/2018 CLINICAL DATA:  73 year old female with pain third proximal interphalangeal joint space for the past week. No injury. Initial encounter. EXAM: LEFT MIDDLE FINGER 2+V COMPARISON:  None. FINDINGS: Question tiny erosion ulnar aspect left third middle phalanx proximal aspect. Otherwise negative plain  film exam of the left third finger. IMPRESSION: Question tiny erosion ulnar aspect left third middle phalanx proximal aspect. Otherwise negative. Electronically Signed   By: Lacy DuverneySteven  Olson M.D.   On: 05/10/2018 17:50     CBC Recent Labs  Lab 05/31/18 2140 06/01/18 0911 06/03/18 0321  WBC 13.0* 7.4 3.1*  HGB 12.9 11.9* 11.4*  HCT 41.1 39.6 37.6  PLT 268 224 224  MCV 89.5 93.0 90.8  MCH 28.1 27.9 27.5  MCHC 31.4 30.1 30.3  RDW 13.0 13.2 13.3  LYMPHSABS 0.5*  --   --   MONOABS 1.0  --   --   EOSABS 0.1  --   --   BASOSABS 0.0  --   --     Chemistries  Recent Labs  Lab 05/31/18 2140 06/01/18 0746 06/01/18 0911 06/02/18 0347 06/03/18 0321  NA 133*  --  135 135 136  K 3.5  --  3.2* 3.5 3.9  CL 100  --  102 104 106  CO2 22  --  23 24 25   GLUCOSE 273*  --  217* 139* 168*  BUN 15  --  10 9 10   CREATININE 1.17*  --  0.70 0.72 0.70  CALCIUM 9.1  --  8.4* 8.2* 8.4*  MG  --  1.8  --   --   --   AST 41  --  59*  --  42*  ALT 22  --  23  --  21  ALKPHOS 81  --  73  --  60  BILITOT 1.1  --  0.5  --  0.4   ------------------------------------------------------------------------------------------------------------------ estimated creatinine clearance is 80 mL/min (by C-G formula based on SCr of 0.7 mg/dL). ------------------------------------------------------------------------------------------------------------------ Recent Labs    06/01/18 0911 06/03/18 0321  HGBA1C 9.0* 8.9*   ------------------------------------------------------------------------------------------------------------------ Recent Labs    06/03/18 0321  CHOL 140  HDL <10*  LDLCALC NOT CALCULATED  TRIG 363*  CHOLHDL  NOT CALCULATED   ------------------------------------------------------------------------------------------------------------------ No results for input(s): TSH, T4TOTAL, T3FREE, THYROIDAB in the last 72 hours.  Invalid input(s):  FREET3 ------------------------------------------------------------------------------------------------------------------ No results for input(s): VITAMINB12, FOLATE, FERRITIN, TIBC, IRON, RETICCTPCT in the last 72 hours.  Coagulation profile No results for input(s): INR, PROTIME in the last 168 hours.  No results for input(s): DDIMER in the last 72 hours.  Cardiac Enzymes Recent Labs  Lab 05/31/18 2140 06/01/18 0746 06/01/18 1400  TROPONINI 0.03* 0.10* 0.06*   ------------------------------------------------------------------------------------------------------------------ Invalid input(s): POCBNP   CBG: Recent Labs  Lab 06/02/18 0802 06/02/18 1142 06/02/18 1728 06/02/18 2112 06/03/18 0747  GLUCAP 120* 143* 133* 166* 157*       Studies: Mr Brain Wo Contrast  Result Date: 06/02/2018 CLINICAL DATA:  73 y/o F; altered mental status, likely secondary to ETOH/substance abuse. History of early dementia. EXAM: MRI HEAD WITHOUT CONTRAST TECHNIQUE: Multiplanar, multiecho pulse sequences of the brain and surrounding structures were obtained without intravenous contrast. COMPARISON:  05/31/2018 CT head.  09/19/2015 MRI head. FINDINGS: Brain: Punctate focus of reduced diffusion within right hemi pons (series 3, image 22 and series 5, image 20) compatible with acute/early subacute infarction. No hemorrhage or mass effect. Small chronic infarcts are present within the left cerebellum and left thalamus. Punctate nonspecific T2 FLAIR hyperintensities in subcortical and periventricular white matter are compatible with mild chronic microvascular ischemic changes. Mild volume loss of the brain. Punctate focus of chronic microhemorrhage within left posterior temporal lobe. No extra-axial collection, hydrocephalus, mass effect, or herniation. Partially empty sella turcica. Vascular: Normal flow voids. Skull and upper cervical spine: Normal marrow signal. Sinuses/Orbits: Moderate diffuse paranasal  sinus disease. No abnormal signal of mastoid air cells. Orbits are unremarkable. Other: None. IMPRESSION: 1. Punctate focus of acute/early subacute infarction within the right hemi pons. No associated hemorrhage or mass effect. 2. Mild chronic microvascular ischemic changes and volume loss of the brain. Small chronic infarcts within left pons and left thalamus. 3. Partially empty sella turcica. 4. Moderate diffuse paranasal sinus disease. These results will be called to the ordering clinician or representative by the Radiologist Assistant, and communication documented in the PACS or zVision Dashboard. Electronically Signed   By: Mitzi Hansen M.D.   On: 06/02/2018 22:15      Lab Results  Component Value Date   HGBA1C 8.9 (H) 06/03/2018   HGBA1C 9.0 (H) 06/01/2018   HGBA1C 7.4 (A) 03/01/2018   Lab Results  Component Value Date   MICROALBUR 0.96 07/15/2010   LDLCALC NOT CALCULATED 06/03/2018   CREATININE 0.70 06/03/2018       Scheduled Meds: .  stroke: mapping our early stages of recovery book   Does not apply Once  . aspirin EC  81 mg Oral Daily  . donepezil  5 mg Oral QHS  . enoxaparin (LOVENOX) injection  40 mg Subcutaneous Daily  . guaiFENesin  600 mg Oral BID  . insulin aspart  0-9 Units Subcutaneous TID WC  . insulin glargine  18 Units Subcutaneous Daily  . levETIRAcetam  500 mg Oral BID  . loratadine  10 mg Oral Daily  . magnesium oxide  400 mg Oral Daily  . metoprolol tartrate  25 mg Oral BID  . oseltamivir  75 mg Oral BID  . saccharomyces boulardii  250 mg Oral BID   Continuous Infusions: . 0.9 % sodium chloride with kcl 50 mL/hr at 06/03/18 0600  . vancomycin Stopped (06/02/18 1935)     LOS: 2 days    Time spent: >  30 MINS    Richarda OverlieNayana Chaylee Ehrsam  [facility] Pager [phone]. If 7PM-7AM, please contact night-coverage at [email or web address], password [email or web address] 06/03/2018, 8:59 AM  LOS: 2 days

## 2018-06-03 NOTE — Care Management Important Message (Signed)
Important Message  Patient Details  Name: Paula Reed MRN: [id number] Date of Birth: 09/25/1945   Medicare Important Message Given:  Yes    Caren Macadam 06/03/2018, 11:33 AMImportant Message  Patient Details  Name: Paula Reed MRN: [id number] Date of Birth: 11-Dec-1945   Medicare Important Message Given:  Yes    Caren Macadam 06/03/2018, 11:33 AM

## 2018-06-03 NOTE — Plan of Care (Signed)
  Problem: Nutrition: Goal: Adequate nutrition will be maintained Outcome: Progressing   Problem: Pain Managment: Goal: General experience of comfort will improve Outcome: Progressing   Problem: Skin Integrity: Goal: Risk for impaired skin integrity will decrease Outcome: Progressing   Problem: Clinical Measurements: Goal: Ability to maintain a body temperature in the normal range will improve Outcome: Progressing   Problem: Respiratory: Goal: Ability to maintain adequate ventilation will improve Outcome: Progressing Goal: Ability to maintain a clear airway will improve Outcome: Progressing

## 2018-06-03 NOTE — Progress Notes (Signed)
Patient ID: Paula Reed, female   DOB: Nov 04, 1945, [age] y.o.   MRN: [id number]         [facility]  Date of Admission:  05/31/2018           Day 4 oseltamivir        Day 3 vancomycin ASSESSMENT: She has influenza A complicated by MRSA bacteremia.  She is improving on antimicrobial therapy.  Repeat blood cultures are negative at 24 hours.  There is no evidence of endocarditis clinically or by TTE however brain MRI raised the possibility of an acute stroke.  Therefore, I would recommend TEE to help rule out endocarditis and guide optimal duration of vancomycin therapy.  PLAN: 1. Continue oseltamivir for 1 more day 2. Continue vancomycin 3. PICC placement this weekend if blood cultures remain negative 4. Recommend TEE 5. Please call me for any ID questions this weekend  Principal Problem:   MRSA bacteremia Active Problems:   Influenza A   Diabetes mellitus, type II (HCC)   Hypertension   Hyperlipidemia   Probable Alzheimer's dementia without behavioral disturbance   Seizures (HCC)   Scheduled Meds: .  stroke: mapping our early stages of recovery book   Does not apply Once  . aspirin  325 mg Oral Daily  . atorvastatin  80 mg Oral q1800  . donepezil  5 mg Oral QHS  . enoxaparin (LOVENOX) injection  40 mg Subcutaneous Daily  . guaiFENesin  600 mg Oral BID  . insulin aspart  0-9 Units Subcutaneous TID WC  . insulin glargine  18 Units Subcutaneous Daily  . levETIRAcetam  500 mg Oral BID  . loratadine  10 mg Oral Daily  . magnesium oxide  400 mg Oral Daily  . metoprolol tartrate  25 mg Oral BID  . oseltamivir  75 mg Oral BID  . saccharomyces boulardii  250 mg Oral BID   Continuous Infusions: . 0.9 % sodium chloride with kcl 50 mL/hr at 06/03/18 0600  . vancomycin Stopped (06/02/18 1935)   PRN Meds:.acetaminophen **OR** acetaminophen, albuterol, ondansetron **OR** ondansetron (ZOFRAN) IV, sodium chloride flush   SUBJECTIVE: She says that she  is feeling better.  She does not recall how long she was feeling bad before admission.  Review of Systems: Review of Systems  Constitutional: Negative for chills, diaphoresis and fever.  Respiratory: Negative for cough, sputum production and shortness of breath.   Cardiovascular: Negative for chest pain.    Allergies  Allergen Reactions  . Chlorthalidone Rash    Per [facility] records, pt may have had a rash rxn to chlorthalidone    OBJECTIVE: Vitals:   06/02/18 1452 06/02/18 2055 06/03/18 0558 06/03/18 1204  BP: (!) 159/72 (!) 136/52 (!) 171/67 (!) 153/73  Pulse: 62 67 66 60  Resp: 20 (!) 22 15 20   Temp: 97.9 F (36.6 C) 98.6 F (37 C) 98.5 F (36.9 C) 98.1 F (36.7 C)  TempSrc: Oral Oral Oral Oral  SpO2:  95% 96% 97%  Weight:      Height:       Body mass index is 33.72 kg/m.  Physical Exam Constitutional:      Comments: She is more alert.    Cardiovascular:     Rate and Rhythm: Normal rate and regular rhythm.     Heart sounds: No murmur.  Pulmonary:     Effort: Pulmonary effort is normal.     Breath sounds: Normal breath sounds.     Lab Results Lab  Results  Component Value Date   WBC 3.1 (L) 06/03/2018   HGB 11.4 (L) 06/03/2018   HCT 37.6 06/03/2018   MCV 90.8 06/03/2018   PLT 224 06/03/2018    Lab Results  Component Value Date   CREATININE 0.70 06/03/2018   BUN 10 06/03/2018   NA 136 06/03/2018   K 3.9 06/03/2018   CL 106 06/03/2018   CO2 25 06/03/2018    Lab Results  Component Value Date   ALT 21 06/03/2018   AST 42 (H) 06/03/2018   ALKPHOS 60 06/03/2018   BILITOT 0.4 06/03/2018     Microbiology: Recent Results (from the past 240 hour(s))  Blood Culture (routine x 2)     Status: None (Preliminary result)   Collection Time: 05/31/18  9:41 PM  Result Value Ref Range Status   Specimen Description   Final    BLOOD RIGHT ANTECUBITAL Performed at [facility], [address] [place], [place] [address]    Special  Requests   Final    BOTTLES DRAWN AEROBIC AND ANAEROBIC Blood Culture adequate volume Performed at [facility], [address] [place], [place] [address]    Culture   Final    NO GROWTH 3 DAYS Performed at [facility], [address] [place], [place] [address]    Report Status PENDING  Incomplete  Blood Culture (routine x 2)     Status: Abnormal   Collection Time: 05/31/18  9:59 PM  Result Value Ref Range Status   Specimen Description   Final    BLOOD LEFT HAND Performed at [facility], [address] [place], [place] [address]    Special Requests   Final    BOTTLES DRAWN AEROBIC AND ANAEROBIC Blood Culture adequate volume Performed at [facility], [address] [place], [place] [address]    Culture  Setup Time   Final    GRAM POSITIVE COCCI IN CLUSTERS IN BOTH AEROBIC AND ANAEROBIC BOTTLES CRITICAL RESULT CALLED TO, READ BACK BY AND VERIFIED WITH: Jodi MarbleL. Poindexter PharmD 16:30 06/01/18 ([email or web address]) Performed at [facility], [address] [place], [place] [address]    Culture STAPHYLOCOCCUS AUREUS (A)  Final   Report Status 06/03/2018 FINAL  Final   Organism ID, Bacteria STAPHYLOCOCCUS AUREUS  Final      Susceptibility   Staphylococcus aureus - MIC*    CIPROFLOXACIN <=0.5 SENSITIVE Sensitive     ERYTHROMYCIN >=8 RESISTANT Resistant     GENTAMICIN <=0.5 SENSITIVE Sensitive     OXACILLIN <=0.25 SENSITIVE Sensitive     TETRACYCLINE <=1 SENSITIVE Sensitive     VANCOMYCIN 1 SENSITIVE Sensitive     TRIMETH/SULFA <=10 SENSITIVE Sensitive     CLINDAMYCIN <=0.25 SENSITIVE Sensitive     RIFAMPIN <=0.5 SENSITIVE Sensitive     Inducible Clindamycin NEGATIVE Sensitive     * STAPHYLOCOCCUS AUREUS  Blood Culture ID Panel (Reflexed)     Status: Abnormal   Collection Time: 05/31/18  9:59 PM  Result Value Ref Range Status   Enterococcus species NOT DETECTED NOT DETECTED Final   Listeria monocytogenes NOT DETECTED NOT  DETECTED Final   Staphylococcus species DETECTED (A) NOT DETECTED Final    Comment: CRITICAL RESULT CALLED TO, READ BACK BY AND VERIFIED WITH: Jodi MarbleL. Poindexter PharmD 16:30 06/01/18 ([email or web address])    Staphylococcus aureus (BCID) DETECTED (A) NOT DETECTED Final    Comment: Methicillin (oxacillin)-resistant Staphylococcus aureus (MRSA). MRSA is predictably resistant to beta-lactam antibiotics (except  ceftaroline). Preferred therapy is vancomycin unless clinically contraindicated. Patient requires contact precautions if  hospitalized. CRITICAL RESULT CALLED TO, READ BACK BY AND VERIFIED WITH: Jodi Marble PharmD 16:30 06/01/18 ([email or web address])    Methicillin resistance DETECTED (A) NOT DETECTED Final    Comment: RESULT CALLED TO, READ BACK BY AND VERIFIED WITH: Jodi Marble PharmD 16:30 06/01/18 (wilsonm)    Streptococcus species NOT DETECTED NOT DETECTED Final   Streptococcus agalactiae NOT DETECTED NOT DETECTED Final   Streptococcus pneumoniae NOT DETECTED NOT DETECTED Final   Streptococcus pyogenes NOT DETECTED NOT DETECTED Final   Acinetobacter baumannii NOT DETECTED NOT DETECTED Final   Enterobacteriaceae species NOT DETECTED NOT DETECTED Final   Enterobacter cloacae complex NOT DETECTED NOT DETECTED Final   Escherichia coli NOT DETECTED NOT DETECTED Final   Klebsiella oxytoca NOT DETECTED NOT DETECTED Final   Klebsiella pneumoniae NOT DETECTED NOT DETECTED Final   Proteus species NOT DETECTED NOT DETECTED Final   Serratia marcescens NOT DETECTED NOT DETECTED Final   Haemophilus influenzae NOT DETECTED NOT DETECTED Final   Neisseria meningitidis NOT DETECTED NOT DETECTED Final   Pseudomonas aeruginosa NOT DETECTED NOT DETECTED Final   Candida albicans NOT DETECTED NOT DETECTED Final   Candida glabrata NOT DETECTED NOT DETECTED Final   Candida krusei NOT DETECTED NOT DETECTED Final   Candida parapsilosis NOT DETECTED NOT DETECTED Final   Candida tropicalis NOT DETECTED NOT DETECTED Final    Comment:  Performed at [facility], [address] [place], [place] [address]  Culture, Urine     Status: Abnormal   Collection Time: 06/01/18  4:47 AM  Result Value Ref Range Status   Specimen Description   Final    URINE, CLEAN CATCH Performed at [facility], [address] [place], [place] [address]    Special Requests   Final    NONE Performed at [facility], [address], [place], [place] [address]    Culture MULTIPLE SPECIES PRESENT, SUGGEST RECOLLECTION (A)  Final   Report Status 06/02/2018 FINAL  Final  Culture, blood (routine x 2)     Status: None (Preliminary result)   Collection Time: 06/02/18  8:03 AM  Result Value Ref Range Status   Specimen Description   Final    BLOOD RIGHT HAND Performed at [facility], [address], [place], [place] [address]    Special Requests   Final    BOTTLES DRAWN AEROBIC ONLY Blood Culture results may not be optimal due to an inadequate volume of blood received in culture bottles Performed at [facility], [address], [place], [place] [address]    Culture   Final    NO GROWTH < 24 HOURS Performed at [facility], [address] [place], [place] [address]    Report Status PENDING  Incomplete  Culture, blood (routine x 2)     Status: None (Preliminary result)   Collection Time: 06/02/18  8:03 AM  Result Value Ref Range Status   Specimen Description   Final    BLOOD RIGHT HAND Performed at [facility], [address], [place], [place] [address]    Special Requests   Final    BOTTLES DRAWN AEROBIC ONLY Blood Culture adequate volume Performed at [facility], [address] [place], [place] [address]    Culture   Final    NO GROWTH < 24 HOURS Performed at [facility], [address] [place], [place] [address]    Report Status PENDING  Incomplete    Cliffton Asters, MD [facility] [phone] pager   [phone] cell 06/03/2018, 1:46 PM

## 2018-06-03 NOTE — Progress Notes (Signed)
Patient transported off unit to CT Scan.

## 2018-06-03 NOTE — Progress Notes (Signed)
Carotid duplex       has been completed. Preliminary results can be found under CV proc through chart review. Stormi Vandevelde, BS, RDMS, RVT   

## 2018-06-03 NOTE — Progress Notes (Signed)
Physical Therapy Treatment Patient Details Name: Paula Reed MRN: [id number] DOB: 1945-12-26 Today's Date: 06/03/2018    History of Present Illness 73 yo female admitted to ED on 2/4 with falls x2 and fatigue. Pt with medical diagnoses of Influenza A and MRSA bacteremia. MRI 2/6 revealed acute ischemic stroke in the right pons-likely small vessel disease.  PMH includes DMII, HLD, HTN, seizures, alzheimer's dementia, heart murmur.     PT Comments    Pt assisted to sitting EOB and performed a couple LE exercises.  Pt felt unable to stand due to fatigue, weakness, and right knee pain.  Pt assisted back to supine and repositioned to comfort.  Continue to recommend SNF upon d/c.   Follow Up Recommendations  SNF;Supervision/Assistance - 24 hour     Equipment Recommendations  Rolling walker with 5" wheels    Recommendations for Other Services       Precautions / Restrictions Precautions Precautions: Fall    Mobility  Bed Mobility Overal bed mobility: Needs Assistance Bed Mobility: Supine to Sit;Sit to Supine Rolling: Max assist   Supine to sit: Mod assist     General bed mobility comments: assist for R LE and trunk upright; assist for LEs onto bed   Transfers                 General transfer comment: pt felt unable to stand, right knee pain and weakness limiting  Ambulation/Gait                 Stairs             Wheelchair Mobility    Modified Rankin (Stroke Patients Only)       Balance Overall balance assessment: Needs assistance;History of Falls Sitting-balance support: Feet supported;No upper extremity supported Sitting balance-Leahy Scale: Fair Sitting balance - Comments: able to sit without support, posterior leaning with LE movement                                    Cognition Arousal/Alertness: Awake/alert Behavior During Therapy: WFL for tasks assessed/performed Overall Cognitive Status: Within Functional Limits  for tasks assessed                                 General Comments: pt appropriate during session today, assisting as able, following simple commands      Exercises General Exercises - Lower Extremity Ankle Circles/Pumps: AROM;10 reps;Seated;Both Short Arc Quad: AROM;10 reps;Seated;Both    General Comments        Pertinent Vitals/Pain Pain Assessment: Faces Faces Pain Scale: Hurts even more Pain Location: R knee Pain Descriptors / Indicators: Aching;Tender;Sore Pain Intervention(s): Monitored during session;Repositioned;Limited activity within patient's tolerance    Home Living                      Prior Function            PT Goals (current goals can now be found in the care plan section) Progress towards PT goals: Progressing toward goals    Frequency    Min 2X/week      PT Plan Current plan remains appropriate    Co-evaluation              AM-PAC PT "6 Clicks" Mobility   Outcome Measure  Help needed turning from your back to your side  while in a flat bed without using bedrails?: A Lot Help needed moving from lying on your back to sitting on the side of a flat bed without using bedrails?: A Lot Help needed moving to and from a bed to a chair (including a wheelchair)?: Total Help needed standing up from a chair using your arms (e.g., wheelchair or bedside chair)?: Total Help needed to walk in hospital room?: Total Help needed climbing 3-5 steps with a railing? : Total 6 Click Score: 8    End of Session   Activity Tolerance: Patient limited by fatigue;Patient limited by pain Patient left: in bed;with call bell/phone within reach;with bed alarm set   PT Visit Diagnosis: Other abnormalities of gait and mobility (R26.89);Muscle weakness (generalized) (M62.81)     Time: 4174-0814 PT Time Calculation (min) (ACUTE ONLY): 19 min  Charges:  $Therapeutic Activity: 8-22 mins                     Zenovia Jarred, PT, DPT Acute  Rehabilitation Services Office: [phone] Pager: [phone]  [name] 06/03/2018, 12:49 PM

## 2018-06-03 NOTE — Progress Notes (Signed)
CTA head neck with motion artifact. No LVO, no posterior circ stenosis/occlusion. Some LICA stenosis, but not symptomatic. Recs as before in consult.  Neurology will sign off. Please call with questions.

## 2018-06-03 NOTE — Progress Notes (Signed)
English as a second language teacher received. Patient can transfer when medically stable.   Vivi Barrack, [place], MSW Clinical Social Worker  [phone] 06/03/2018  12:24 PM

## 2018-06-03 NOTE — Consult Note (Addendum)
Neurology Consultation  Reason for Consult: Stroke on MRI Referring Physician: Dr. Susie CassetteABROL  CC: Fall  History is obtained from: Chart  HPI: Paula Reed is a 73 y.o. female past medical history of dementia, hyperlipidemia, hypertension, diabetes, seizure disorder came to the hospital after she had a fall at home.  She was unable provide much history to the admitting hospitalist and is not able to provide a great amount of history at this time as well. She had no complaint of any nausea vomiting diarrhea or abdominal pain dysuria or frequency urination.  No chest pain shortness of breath. She does have some history of runny nose over the past few days.  She was febrile in the emergency room with a temperature of one 1.9 Fahrenheit and had mildly abnormal urinalysis.  She was started on ceftriaxone and Zithromax for possible UTI and questionable pneumonia.  Blood testing came back positive for influenza A for which she is being treated with antivirals. Blood cultures revealed 1 out of 2 admission bottles showing MRSA.  Infectious diseases consultation has been obtained and Dr. Orvan Falconerampbell is following. She does state she has weakness on the right leg at baseline.  She was not able to tell me if she consistently uses a walker or cane to walk.  LKW: At least 2 days ago tpa given?: no, outside the window Premorbid modified Rankin scale (mRS): 2  ROS: ROS was performed and is negative except as noted in the HPI.    Past Medical History:  Diagnosis Date  . Allergic rhinitis   . Diabetes (HCC)   . Diabetes mellitus without complication (HCC)   . Hemorrhoids   . Hyperlipidemia   . Hypertension   . Probable Alzheimer's dementia without behavioral disturbance 09/15/2015   [facility]   . Seizures (HCC)     Family History  Problem Relation Age of Onset  . Heart disease Father   . Hypertension Father   . Hypertension Mother   . Colon cancer Other        aunt   Social History:    reports that she has quit smoking. She has never used smokeless tobacco. She reports that she does not drink alcohol or use drugs.  Medications  Current Facility-Administered Medications:  .   stroke: mapping our early stages of recovery book, , Does not apply, Once, Schorr, Roma KayserKatherine P, NP .  acetaminophen (TYLENOL) tablet 650 mg, 650 mg, Oral, Q6H PRN **OR** acetaminophen (TYLENOL) suppository 650 mg, 650 mg, Rectal, Q6H PRN, Sharl MaLama, Gagan S, MD .  albuterol (PROVENTIL) (2.5 MG/3ML) 0.083% nebulizer solution 2.5 mg, 2.5 mg, Nebulization, Q2H PRN, Sharl MaLama, Sarina IllGagan S, MD .  aspirin tablet 325 mg, 325 mg, Oral, Daily, Abrol, Nayana, MD, 325 mg at 06/03/18 0943 .  atorvastatin (LIPITOR) tablet 80 mg, 80 mg, Oral, q1800, Abrol, Nayana, MD .  donepezil (ARICEPT) tablet 5 mg, 5 mg, Oral, QHS, Sharl MaLama, Sarina IllGagan S, MD, 5 mg at 06/02/18 2055 .  enoxaparin (LOVENOX) injection 40 mg, 40 mg, Subcutaneous, Daily, Sharl MaLama, Sarina IllGagan S, MD, 40 mg at 06/03/18 0942 .  guaiFENesin (MUCINEX) 12 hr tablet 600 mg, 600 mg, Oral, BID, Sharl MaLama, Sarina IllGagan S, MD, 600 mg at 06/03/18 0943 .  insulin aspart (novoLOG) injection 0-9 Units, 0-9 Units, Subcutaneous, TID WC, Meredeth IdeLama, Gagan S, MD, 1 Units at 06/03/18 [phone] .  insulin glargine (LANTUS) injection 18 Units, 18 Units, Subcutaneous, Daily, Richarda OverlieAbrol, Nayana, MD, 18 Units at 06/03/18 0942 .  levETIRAcetam (KEPPRA) tablet 500  mg, 500 mg, Oral, BID, Sharl Ma, Sarina Ill, MD, 500 mg at 06/03/18 0942 .  loratadine (CLARITIN) tablet 10 mg, 10 mg, Oral, Daily, Sharl Ma, Sarina Ill, MD, 10 mg at 06/03/18 0943 .  magnesium oxide (MAG-OX) tablet 400 mg, 400 mg, Oral, Daily, Abrol, Nayana, MD, 400 mg at 06/03/18 0943 .  metoprolol tartrate (LOPRESSOR) tablet 25 mg, 25 mg, Oral, BID, Sharl Ma, Sarina Ill, MD, 25 mg at 06/03/18 0942 .  ondansetron (ZOFRAN) tablet 4 mg, 4 mg, Oral, Q6H PRN **OR** ondansetron (ZOFRAN) injection 4 mg, 4 mg, Intravenous, Q6H PRN, Sharl Ma, Sarina Ill, MD .  oseltamivir (TAMIFLU) capsule 75 mg, 75 mg, Oral,  BID, Sharl Ma, Sarina Ill, MD, 75 mg at 06/03/18 0943 .  saccharomyces boulardii (FLORASTOR) capsule 250 mg, 250 mg, Oral, BID, Abrol, Nayana, MD, 250 mg at 06/03/18 0943 .  sodium chloride 0.9 % 1,000 mL with potassium chloride 60 mEq infusion, , Intravenous, Continuous, Abrol, Nayana, MD, Last Rate: 50 mL/hr at 06/03/18 0600 .  sodium chloride flush (NS) 0.9 % injection 10-40 mL, 10-40 mL, Intracatheter, PRN, Richarda Overlie, MD .  vancomycin (VANCOCIN) 1,250 mg in sodium chloride 0.9 % 250 mL IVPB, 1,250 mg, Intravenous, Q24H, [name], RPH, Stopped at 06/02/18 1935  Exam: Current vital signs: BP (!) 171/67 (BP Location: Right Arm)   Pulse 66   Temp 98.5 F (36.9 C) (Oral)   Resp 15   Ht 5\' 8"  (1.727 m)   Wt 100.6 kg   SpO2 96%   BMI 33.72 kg/m  Vital signs in last 24 hours: Temp:  [97.9 F (36.6 C)-98.6 F (37 C)] 98.5 F (36.9 C) (02/07 0558) Pulse Rate:  [62-67] 66 (02/07 0558) Resp:  [15-22] 15 (02/07 0558) BP: (136-171)/(52-72) 171/67 (02/07 0558) SpO2:  [95 %-96 %] 96 % (02/07 0558) General: Patient is awake alert in no distress HEENT: Normal cephalic atraumatic, moist oral mucous membranes, some clear discharge from her nares. CVS: S1-2 regular Lungs: Clear to auscultation Abdomen: Soft nondistended nontender Extremities: Warm well perfused with no edema Neurological exam She is awake alert oriented to self, oriented to the fact that she is in the hospital but could not tell me the name.  When asked her age, she said she is in her 52s. Poor attention concentration. Seem to be in some discomfort with generalized body aches. Her speech is non-dysarthric.  Naming comprehension repetition intact. Cranial nerves: Pupils are equal round reactive to light, extraocular movements are intact, visual fields are full to confrontation intact, face is symmetric, auditory acuity is intact, palate elevates midline, tongue midline. Motor exam: Upper extremities 5/5 antigravity without  drift.  Lower extremities right lower extremities 4+/5 with some drift.  Left lower extremity 5/5 no drift. Sensory exam: Intact to light touch all over with no extinction. Coordination: Intact finger-nose-finger.  Had difficulty performing heel-knee-shin bilaterally. Gait testing was deferred at this time. NIHSS 1a Level of Conscious.: 0 1b LOC Questions: 1 1c LOC Commands: 0 2 Best Gaze: 0 3 Visual: 0 4 Facial Palsy: 0 5a Motor Arm - left: 0 5b Motor Arm - Right: 0 6a Motor Leg - Left: 0 6b Motor Leg - Right: 1 7 Limb Ataxia: 0 8 Sensory: 0 9 Best Language: 0 10 Dysarthria: 0 11 Extinct. and Inatten.: 0 TOTAL: 2  Labs I have reviewed labs in epic and the results pertinent to this consultation are:  CBC    Component Value Date/Time   WBC 3.1 (L) 06/03/2018 0321  RBC 4.14 06/03/2018 0321   HGB 11.4 (L) 06/03/2018 0321   HCT 37.6 06/03/2018 0321   PLT 224 06/03/2018 0321   MCV 90.8 06/03/2018 0321   MCH 27.5 06/03/2018 0321   MCHC 30.3 06/03/2018 0321   RDW 13.3 06/03/2018 0321   LYMPHSABS 0.5 (L) 05/31/2018 2140   MONOABS 1.0 05/31/2018 2140   EOSABS 0.1 05/31/2018 2140   BASOSABS 0.0 05/31/2018 2140    CMP     Component Value Date/Time   NA 136 06/03/2018 0321   NA 134 06/11/2016 1611   K 3.9 06/03/2018 0321   CL 106 06/03/2018 0321   CO2 25 06/03/2018 0321   GLUCOSE 168 (H) 06/03/2018 0321   BUN 10 06/03/2018 0321   BUN 13 06/11/2016 1611   CREATININE 0.70 06/03/2018 0321   CREATININE 0.62 09/12/2015 1025   CALCIUM 8.4 (L) 06/03/2018 0321   PROT 6.5 06/03/2018 0321   PROT 7.5 06/04/2016 1125   ALBUMIN 3.0 (L) 06/03/2018 0321   ALBUMIN 4.1 06/04/2016 1125   AST 42 (H) 06/03/2018 0321   ALT 21 06/03/2018 0321   ALKPHOS 60 06/03/2018 0321   BILITOT 0.4 06/03/2018 0321   BILITOT 0.7 06/04/2016 1125   GFRNONAA >60 06/03/2018 0321   GFRNONAA >89 09/12/2015 1025   GFRAA >60 06/03/2018 0321   GFRAA >89 09/12/2015 1025    Lipid Panel     Component  Value Date/Time   CHOL 140 06/03/2018 0321   CHOL 127 06/04/2016 1125   TRIG 363 (H) 06/03/2018 0321   HDL <10 (L) 06/03/2018 0321   HDL 45 06/04/2016 1125   CHOLHDL NOT CALCULATED 06/03/2018 0321   VLDL 73 (H) 06/03/2018 0321   LDLCALC NOT CALCULATED 06/03/2018 0321   LDLCALC 62 06/04/2016 1125   Hemoglobin A1c 8.9 Transthoracic 2D echocardiogram IMPRESSIONS  1. The left ventricle has normal systolic function of 60-65%. The cavity size was normal. There is mildly increased left ventricular wall thickness. Echo evidence of impaired diastolic relaxation.  2. The right ventricle has normal systolic function. The cavity was normal. There is no increase in right ventricular wall thickness.  3. The mitral valve is normal in structure. No evidence of mitral valve stenosis. No significant mitral regurgitation.  4. The tricuspid valve is normal in structure.  5. The aortic valve is tricuspid. No aortic stenosis.  6. The pulmonic valve was normal in structure.  7. The aortic root and ascending aorta are normal in size and structure.  8. No evidence of left ventricular regional wall motion abnormalities.  9. No complete TR doppler jet so unable to estimate PA systolic pressure. 10. No definite evidence for vegetation noted.  Imaging I have reviewed the images obtained: MRI examination of the brain-no bleed.  Punctate pontine restricted diffusion.  Generalized atrophy.  Chronic white matter disease.    Assessment: 73 year old with multiple cerebrovascular risk factors presented for evaluation of a fall and was found to have fever.  She was influenza positive and had a possible mild UTI. 1 of the blood culture bottles grew MRSA.  Further evaluation with that is underway by primary team and infectious diseases. MRI of the brain was done, possibly to evaluate altered mental status and revealed a punctate right pontine area of restricted diffusion which is a acute to subacute stroke. I think this  MRI finding is completely incidental but she does have all the risk factors and the stroke work-up has been completed.  Recommendations from a stroke respective are below. I  doubt that this is cardioembolic from any vegetations and is incidental secondary to small vessel disease from HTN, DM, HLD. Transthoracic echo has been unremarkable.  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  Evaluate for any evidence of arrhythmia. CTA head/neck. We will follow results and update recs if needed. Aspirin 325 mg p.o. daily Atorvastatin 80 mg p.o. daily for goal LDL less than 70. Management of diabetes with a goal hemoglobin A1c less than 7 per primary team Frequent neurochecks while in the hospital Physical therapy Occupational Therapy Speech therapy N.p.o. until cleared by bedside or formal speech evaluation Infectious work-up per primary team and ID as you are currently pursuing. If there is any evidence of vegetations on repeat echocardiography, then aspirin should be held otherwise continue with aspirin atorvastatin as above. Follow-up with outpatient neurology stroke clinic at [facility] neurology in 4 to 6 weeks after discharge. No need to transfer patient to [facility] as the work-up has been completed from a stroke perspective. Please call neurology with questions.  We will be available as needed. Relayed plan to Dr. Susie Cassette over the phone at [facility].  Will follow CTA results.  -- Milon Dikes, MD [facility] Pager: [phone] If 7pm to 7am, please call on call as listed on AMION.

## 2018-06-04 LAB — COMPREHENSIVE METABOLIC PANEL
ALT: 22 U/L (ref 0–44)
AST: 44 U/L — ABNORMAL HIGH (ref 15–41)
Albumin: 3.3 g/dL — ABNORMAL LOW (ref 3.5–5.0)
Alkaline Phosphatase: 60 U/L (ref 38–126)
Anion gap: 9 (ref 5–15)
BUN: 8 mg/dL (ref 8–23)
CO2: 21 mmol/L — ABNORMAL LOW (ref 22–32)
Calcium: 8.7 mg/dL — ABNORMAL LOW (ref 8.9–10.3)
Chloride: 106 mmol/L (ref 98–111)
Creatinine, Ser: 0.61 mg/dL (ref 0.44–1.00)
GFR calc Af Amer: >60 mL/min (ref >60)
GFR calc non Af Amer: >60 mL/min (ref >60)
Glucose, Bld: 153 mg/dL — ABNORMAL HIGH (ref 70–99)
Potassium: 5.1 mmol/L (ref 3.5–5.1)
Sodium: 136 mmol/L (ref 135–145)
Total Bilirubin: 0.7 mg/dL (ref 0.3–1.2)
Total Protein: 7.2 g/dL (ref 6.5–8.1)

## 2018-06-04 LAB — CBC
HCT: 39.8 % (ref 36.0–46.0)
Hemoglobin: 13.1 g/dL (ref 12.0–15.0)
MCH: 27.9 pg (ref 26.0–34.0)
MCHC: 32.9 g/dL (ref 30.0–36.0)
MCV: 84.7 fL (ref 80.0–100.0)
Platelets: 273 10*3/uL (ref 150–400)
RBC: 4.7 MIL/uL (ref 3.87–5.11)
RDW: 13.3 % (ref 11.5–15.5)
WBC: 4.5 10*3/uL (ref 4.0–10.5)
nRBC: 0 % (ref 0.0–0.2)

## 2018-06-04 LAB — GLUCOSE, CAPILLARY
Glucose-Capillary: 145 mg/dL — ABNORMAL HIGH (ref 70–99)
Glucose-Capillary: 170 mg/dL — ABNORMAL HIGH (ref 70–99)
Glucose-Capillary: 137 mg/dL — ABNORMAL HIGH (ref 70–99)
Glucose-Capillary: 147 mg/dL — ABNORMAL HIGH (ref 70–99)

## 2018-06-04 LAB — MAGNESIUM: Magnesium: 1.9 mg/dL (ref 1.7–2.4)

## 2018-06-04 MED ORDER — BENZONATATE 100 MG PO CAPS
100.0000 mg | ORAL_CAPSULE | Freq: Three times a day (TID) | ORAL | Status: AC
  Administered 2018-06-04 – 2018-06-06 (×6): 100 mg via ORAL
  Filled 2018-06-04 (×6): qty 1

## 2018-06-04 MED ORDER — MUSCLE RUB 10-15 % EX CREA
TOPICAL_CREAM | CUTANEOUS | Status: DC | PRN
  Administered 2018-06-04: 11:00:00 via TOPICAL
  Filled 2018-06-04: qty 85

## 2018-06-04 NOTE — Progress Notes (Signed)
PROGRESS NOTE  Paula CollaBetty L Reed Paula Reed DOB: 1945/06/02 DOA: 05/31/2018 PCP: Paula Reed  HPI/Recap of past 24 hours: 73 y.o.female,with history of dementia, hyperlipidemia, hypertension, diabetes mellitus type 2, seizure disorder came to hospital after patient fell at home. In the ED patient was found to be febrile with a temp of 101.9. She had mildly abnormal UA and started on ceftriaxone and Zithromax for possible UTI and questionable pneumonia. Her O2 sats were 91% on room air.Influenza PCR came back positive for influenza A. Patient also found to have acute/subacute stroke.  06/04/18: Patient seen and examined at bedside.  She had no acute events overnight.  She has no new complaints today.  Continue IV antibiotics for MRSA bacteremia.  Assessment/Plan: Principal Problem:   MRSA bacteremia Active Problems:   Diabetes mellitus, type II (HCC)   Hypertension   Hyperlipidemia   Probable Alzheimer's dementia without behavioral disturbance   Seizures (HCC)   Influenza A  MRSA sepsis/Influenza A- Presented with fever, influenza PCR positive for influenza A, patient empirically started on antibiotics to prevent superimposed infection but ID felt she has no evidence off pneumonia on chest x-ray and clinically. discontinued ceftriaxone and Zithromax.continueonTamiflu 75 mg p.o. twice daily.  blood culture positive for gram-positive cocci,  MRSA. Source unclear. 2-D echo without vegetations. ID recommends TEE, placed cardiology consult and notified fellow on call. Patient started on vancomycin IV.ID will determine duration of treatment. Repeat blood cultures from 2/ 6 no growth so far. Ok to place PICC if remains negative in am, for continuation of iv vancomycin.. Gradually improving , hypoxia resolving .  2. ? UTI-patient has mildly abnormal UA, denies any symptoms. urine culture nonspecific  3. Diabetes mellitus type 2-continue with Lantus 18 units subcu  daily,Continuesliding scale insulin with NovoLog. hemoglobin A1c 8.9. Triglycerides 363, LDL not calculated, HDL less than 10   4. Dementia-no behavior disturbance, continue Aricept.    5. Acute CVA MRI of the brain  Confirmed acute/subacute infarction . Reed notified by NP last night, he recommended focused TIA order set. Increase aspirin to 325 mg a day. Started  patient on a statin Lipitor 80 mg daily. Telemetry showed normal sinus rhythm.discuss with Paula Reed, he recommend CTA head and neck. He does not see any indication to transfer patient to [facility]. Patient  seen by PT OT speech . PT recommends SNF. Follow-up with outpatient [facility] stroke [facility] at [facility] in 4 to 6 weeks after discharge  6. History of seizures-continue Keppra   7. Prolonged QTc interval-QTC is prolonged to 525, will start cardiac monitoring. Magnesium 1.8 . Magnesium oxide   8. Fall/ bruising-patient noted to have bruising on her bilateral knees and lips. She is unable to provide any history regarding falls.  Patient's husband is debilitated and unable to take care of her. She will need SNF   9. Abnormal troponin-suspect secondary to demand ischemia in the setting of influenza/acute bronchitis/sepsis/CVA . 2-D echo did not show any significant   wall motion abnormalities. 10. Physical debility/ambulatory dysfunction: PT recommend SNF.  CSW consulted for placement.    DVT prophylaxsis Lovenox  Code Status:  Full code    Family Communication: Discussed in detail with the patient /son/husband, all imaging results, lab results explained to the patient   Disposition Plan: SNF Monday      Consultants:  Infectious disease  Procedures:  none      Objective: Vitals:   06/03/18 1204 06/03/18 2025 06/04/18 0517 06/04/18 1253  BP: (!) 153/73 [name](!)  170/64 (!) 165/67 (!) 168/84  Pulse: 60 60 66 (!) 56  Resp: 20 20 18 16   Temp: 98.1 F (36.7 C) 98.3 F (36.8 C) 98.1  F (36.7 C) 98.3 F (36.8 C)  TempSrc: Oral Oral  Oral  SpO2: 97% 97% 97% 96%  Weight:      Height:        Intake/Output Summary (Last 24 hours) at 06/04/2018 1640 Last data filed at 06/04/2018 1639 Gross per 24 hour  Intake 764.67 ml  Output 2100 ml  Net -1335.33 ml   Filed Weights   06/01/18 0215 06/01/18 0444  Weight: 100.7 kg 100.6 kg    Exam:  . General: 73 y.o. year-old female well developed well nourished in no acute distress.  Alert and interactive. . Cardiovascular: Regular rate and rhythm with no rubs or gallops.  No thyromegaly or JVD noted.   [name] Respiratory: Clear to auscultation with no wheezes or rales. Good inspiratory effort. . Abdomen: Soft nontender nondistended with normal bowel sounds x4 quadrants. . Psychiatry: Mood is appropriate for condition and setting   Data Reviewed: CBC: Recent Labs  Lab 05/31/18 2140 06/01/18 0911 06/03/18 0321 06/04/18 0804  WBC 13.0* 7.4 3.1* 4.5  NEUTROABS 11.2*  --   --   --   HGB 12.9 11.9* 11.4* 13.1  HCT 41.1 39.6 37.6 39.8  MCV 89.5 93.0 90.8 84.7  PLT 268 224 224 273   Basic Metabolic Panel: Recent Labs  Lab 05/31/18 2140 06/01/18 0746 06/01/18 0911 06/02/18 0347 06/03/18 0321 06/04/18 0804  NA 133*  --  135 135 136 136  K 3.5  --  3.2* 3.5 3.9 5.1  CL 100  --  102 104 106 106  CO2 22  --  23 24 25  21*  GLUCOSE 273*  --  217* 139* 168* 153*  BUN 15  --  10 9 10 8   CREATININE 1.17*  --  0.70 0.72 0.70 0.61  CALCIUM 9.1  --  8.4* 8.2* 8.4* 8.7*  MG  --  1.8  --   --   --  1.9   GFR: Estimated Creatinine Clearance: 80 mL/min (by C-G formula based on SCr of 0.61 mg/dL). Liver Function Tests: Recent Labs  Lab 05/31/18 2140 06/01/18 0911 06/03/18 0321 06/04/18 0804  AST 41 59* 42* 44*  ALT 22 23 21 22   ALKPHOS 81 73 60 60  BILITOT 1.1 0.5 0.4 0.7  PROT 8.0 7.0 6.5 7.2  ALBUMIN 4.0 3.4* 3.0* 3.3*   No results for input(s): LIPASE, AMYLASE in the last 168 hours. No results for input(s): AMMONIA  in the last 168 hours. Coagulation Profile: No results for input(s): INR, PROTIME in the last 168 hours. Cardiac Enzymes: Recent Labs  Lab 05/31/18 2140 06/01/18 0746 06/01/18 1400  TROPONINI 0.03* 0.10* 0.06*   BNP (last 3 results) No results for input(s): PROBNP in the last 8760 hours. HbA1C: Recent Labs    06/03/18 0321  HGBA1C 8.9*   CBG: Recent Labs  Lab 06/03/18 1623 06/03/18 2105 06/04/18 0737 06/04/18 1129 06/04/18 1626  GLUCAP 112* 125* 145* 170* 137*   Lipid Profile: Recent Labs    06/03/18 0321  CHOL 140  HDL <10*  LDLCALC NOT CALCULATED  TRIG 363*  CHOLHDL NOT CALCULATED   Thyroid Function Tests: No results for input(s): TSH, T4TOTAL, FREET4, T3FREE, THYROIDAB in the last 72 hours. Anemia Panel: No results for input(s): VITAMINB12, FOLATE, FERRITIN, TIBC, IRON, RETICCTPCT in the last 72 hours. Urine analysis:  Component Value Date/Time   COLORURINE YELLOW 05/31/2018 2317   APPEARANCEUR HAZY (A) 05/31/2018 2317   LABSPEC 1.025 05/31/2018 2317   PHURINE 5.0 05/31/2018 2317   GLUCOSEU 50 (A) 05/31/2018 2317   HGBUR SMALL (A) 05/31/2018 2317   BILIRUBINUR NEGATIVE 05/31/2018 2317   BILIRUBINUR small 06/11/2016 1421   KETONESUR 5 (A) 05/31/2018 2317   PROTEINUR >=300 (A) 05/31/2018 2317   UROBILINOGEN >=8.0 06/11/2016 1421   NITRITE NEGATIVE 05/31/2018 2317   LEUKOCYTESUR NEGATIVE 05/31/2018 2317   Sepsis Labs: @LABRCNTIP (procalcitonin:4,lacticidven:4)  ) Recent Results (from the past 240 hour(s))  Blood Culture (routine x 2)     Status: None (Preliminary result)   Collection Time: 05/31/18  9:41 PM  Result Value Ref Range Status   Specimen Description   Final    BLOOD RIGHT ANTECUBITAL Performed at [facility], [address] [place], [place] [address]    Special Requests   Final    BOTTLES DRAWN AEROBIC AND ANAEROBIC Blood Culture adequate volume Performed at [facility], [address] [place], [place] [address]    Culture   Final    NO GROWTH 3 DAYS Performed at [facility], [address] [place], [place] [address]    Report Status PENDING  Incomplete  Blood Culture (routine x 2)     Status: Abnormal   Collection Time: 05/31/18  9:59 PM  Result Value Ref Range Status   Specimen Description   Final    BLOOD LEFT HAND Performed at [facility], [address] [place], [place] [address]    Special Requests   Final    BOTTLES DRAWN AEROBIC AND ANAEROBIC Blood Culture adequate volume Performed at [facility], [address], [place], [place] [address]    Culture  Setup Time   Final    GRAM POSITIVE COCCI IN CLUSTERS IN BOTH AEROBIC AND ANAEROBIC BOTTLES CRITICAL RESULT CALLED TO, READ BACK BY AND VERIFIED WITH: Jodi Marble PharmD 16:30 06/01/18 ([email or web address]) Performed at [facility], [address] [place], [place] [address]    Culture STAPHYLOCOCCUS AUREUS (A)  Final   Report Status 06/03/2018 FINAL  Final   Organism ID, Bacteria STAPHYLOCOCCUS AUREUS  Final      Susceptibility   Staphylococcus aureus - MIC*    CIPROFLOXACIN <=0.5 SENSITIVE Sensitive     ERYTHROMYCIN >=8 RESISTANT Resistant     GENTAMICIN <=0.5 SENSITIVE Sensitive     OXACILLIN <=0.25 SENSITIVE Sensitive     TETRACYCLINE <=1 SENSITIVE Sensitive     VANCOMYCIN 1 SENSITIVE Sensitive     TRIMETH/SULFA <=10 SENSITIVE Sensitive     CLINDAMYCIN <=0.25 SENSITIVE Sensitive     RIFAMPIN <=0.5 SENSITIVE Sensitive     Inducible Clindamycin NEGATIVE Sensitive     * STAPHYLOCOCCUS AUREUS  Blood Culture ID Panel (Reflexed)     Status: Abnormal   Collection Time: 05/31/18  9:59 PM  Result Value Ref Range Status   Enterococcus species NOT DETECTED NOT DETECTED Final   Listeria monocytogenes NOT DETECTED NOT DETECTED Final   Staphylococcus species DETECTED (A) NOT DETECTED Final    Comment: CRITICAL RESULT CALLED TO, READ BACK BY AND VERIFIED WITH: Jodi Marble PharmD 16:30 06/01/18 ([email or web address])    Staphylococcus aureus (BCID) DETECTED (A) NOT DETECTED Final    Comment: Methicillin (oxacillin)-resistant Staphylococcus aureus (MRSA). MRSA is predictably resistant to beta-lactam antibiotics (except ceftaroline). Preferred therapy is vancomycin unless clinically contraindicated. Patient requires contact precautions if  hospitalized. CRITICAL RESULT CALLED TO, READ BACK BY AND VERIFIED WITH: Jodi MarbleL. Poindexter PharmD 16:30 06/01/18 ([email or web address])    Methicillin resistance DETECTED (A) NOT DETECTED Final    Comment: RESULT CALLED TO, READ BACK BY AND VERIFIED WITH: Jodi MarbleL. Poindexter PharmD 16:30 06/01/18 ([email or web address])    Streptococcus species NOT DETECTED NOT DETECTED Final   Streptococcus agalactiae NOT DETECTED NOT DETECTED Final   Streptococcus pneumoniae NOT DETECTED NOT DETECTED Final   Streptococcus pyogenes NOT DETECTED NOT DETECTED Final   Acinetobacter baumannii NOT DETECTED NOT DETECTED Final   Enterobacteriaceae species NOT DETECTED NOT DETECTED Final   Enterobacter cloacae complex NOT DETECTED NOT DETECTED Final   Escherichia coli NOT DETECTED NOT DETECTED Final   Klebsiella oxytoca NOT DETECTED NOT DETECTED Final   Klebsiella pneumoniae NOT DETECTED NOT DETECTED Final   Proteus species NOT DETECTED NOT DETECTED Final   Serratia marcescens NOT DETECTED NOT DETECTED Final   Haemophilus influenzae NOT DETECTED NOT DETECTED Final   Neisseria meningitidis NOT DETECTED NOT DETECTED Final   Pseudomonas aeruginosa NOT DETECTED NOT DETECTED Final   Candida albicans NOT DETECTED NOT DETECTED Final   Candida glabrata NOT DETECTED NOT DETECTED Final   Candida krusei NOT DETECTED NOT DETECTED Final   Candida parapsilosis NOT DETECTED NOT DETECTED Final   Candida tropicalis NOT DETECTED NOT DETECTED Final    Comment: Performed at [facility], [address] [place], [place] [address]  Culture, Urine     Status: Abnormal   Collection Time: 06/01/18  4:47  AM  Result Value Ref Range Status   Specimen Description   Final    URINE, CLEAN CATCH Performed at [facility], [address] [place], [place] [address]    Special Requests   Final    NONE Performed at [facility], [address] [place], [place] [address]    Culture MULTIPLE SPECIES PRESENT, SUGGEST RECOLLECTION (A)  Final   Report Status 06/02/2018 FINAL  Final  Culture, blood (routine x 2)     Status: None (Preliminary result)   Collection Time: 06/02/18  8:03 AM  Result Value Ref Range Status   Specimen Description   Final    BLOOD RIGHT HAND Performed at [facility], [address] [place], [place] [address]    Special Requests   Final    BOTTLES DRAWN AEROBIC ONLY Blood Culture results may not be optimal due to an inadequate volume of blood received in culture bottles Performed at [facility], [address] [place], [place] [address]    Culture   Final    NO GROWTH 1 DAY Performed at [facility], [address] [place], [place] [address]    Report Status PENDING  Incomplete  Culture, blood (routine x 2)     Status: None (Preliminary result)   Collection Time: 06/02/18  8:03 AM  Result Value Ref Range Status   Specimen Description   Final    BLOOD RIGHT HAND Performed at [facility], [address] [place], [place] [address]    Special Requests   Final    BOTTLES DRAWN AEROBIC ONLY Blood Culture adequate volume Performed at [facility], [address] [place], [place] [address]    Culture   Final    NO GROWTH 1 DAY Performed at [facility], [address] [place], [place] [address]    Report Status PENDING  Incomplete  Studies: No results found.  Scheduled Meds: . aspirin  325 mg Oral Daily  . atorvastatin  80 mg Oral q1800  . donepezil  5 mg Oral QHS  . enoxaparin (LOVENOX) injection  40 mg Subcutaneous Daily    . guaiFENesin  600 mg Oral BID  . insulin aspart  0-9 Units Subcutaneous TID WC  . insulin glargine  18 Units Subcutaneous Daily  . levETIRAcetam  500 mg Oral BID  . loratadine  10 mg Oral Daily  . magnesium oxide  400 mg Oral Daily  . metoprolol tartrate  25 mg Oral BID  . oseltamivir  75 mg Oral BID  . saccharomyces boulardii  250 mg Oral BID    Continuous Infusions: . [START ON 06/06/2018] sodium chloride    . vancomycin Stopped (06/03/18 1936)     LOS: 3 days     Darlin Drop, [facility] Pager [phone]  If 7PM-7AM, please contact night-coverage [email or web address] Password [email or web address] 06/04/2018, 4:40 PM

## 2018-06-04 NOTE — Progress Notes (Signed)
Pharmacy Antibiotic Note  Paula Reed is a 73 y.o. female presented to the ED on 05/31/2018 s/p fall and c/o weakness.  She was started on ceftriaxone and azithromycin for suspected UTI and PNA. One of four bcx bottles from 2/4 came back with GPC in clusters with BCID positive for staph species and staph aureus (MR resistance detected).  Started vancomycin for bacteremia.  Today, 06/04/2018 Day #4 vancomycin Afebrile WBC 4.5, improved SCr 0.61, stable  Plan: - Continue vancomycin 1250 mg IV q24h (for est AUC 543); plan to check levels after 4th dose of this (Sunday PM) - monitor renal function - f/u repeat blood cultures from 2/6  ____________________________________  Height: 5\' 8"  (172.7 cm) Weight: 221 lb 12.5 oz (100.6 kg) IBW/kg (Calculated) : 63.9  Temp (24hrs), Avg:98.2 F (36.8 C), Min:98.1 F (36.7 C), Max:98.3 F (36.8 C)  Recent Labs  Lab 05/31/18 2140 05/31/18 2328 06/01/18 0911 06/02/18 0347 06/03/18 0321 06/04/18 0804  WBC 13.0*  --  7.4  --  3.1* 4.5  CREATININE 1.17*  --  0.70 0.72 0.70 0.61  LATICACIDVEN 2.1* 1.8  --   --   --   --     Estimated Creatinine Clearance: 80 mL/min (by C-G formula based on SCr of 0.61 mg/dL).    Allergies  Allergen Reactions  . Chlorthalidone Rash    Per [facility] records, pt may have had a rash rxn to chlorthalidone   Antimicrobials this admission:  2/4 azithro>>2/5 2/4 CTX>>2/5 2/5 vanc>> 2/5 tamiflu>> (2/9)  Dose adjustments this admission:   Microbiology results:  2/4 BCx x2: 2/4 Staph aureus (BCID=staph species and staph aureus (MR dectected) 2/5 influ A pos 2/6 UCx: multiple species 2/6 BCx: ngtd  Thank you for allowing pharmacy to be a part of this patient's care.  Loralee Pacas, PharmD, BCPS Pager: [phone] 06/04/2018 10:00 AM

## 2018-06-04 NOTE — Plan of Care (Signed)
Patient afebrile, maintains oxygen saturation in the mid to upper 90's on room air.  Tolerating heart healthy diet, ate 100% of lunch and some supper.  Husband at bedside.

## 2018-06-05 ENCOUNTER — Inpatient Hospital Stay: Payer: Self-pay

## 2018-06-05 LAB — CULTURE, BLOOD (ROUTINE X 2)
Culture: NO GROWTH
Special Requests: ADEQUATE

## 2018-06-05 LAB — CBC
HCT: 40.6 % (ref 36.0–46.0)
Hemoglobin: 12.5 g/dL (ref 12.0–15.0)
MCH: 27.4 pg (ref 26.0–34.0)
MCHC: 30.8 g/dL (ref 30.0–36.0)
MCV: 89 fL (ref 80.0–100.0)
Platelets: 262 10*3/uL (ref 150–400)
RBC: 4.56 MIL/uL (ref 3.87–5.11)
RDW: 13.4 % (ref 11.5–15.5)
WBC: 5 10*3/uL (ref 4.0–10.5)
nRBC: 0 % (ref 0.0–0.2)

## 2018-06-05 LAB — COMPREHENSIVE METABOLIC PANEL
ALT: 19 U/L (ref 0–44)
AST: 36 U/L (ref 15–41)
Albumin: 3.4 g/dL — ABNORMAL LOW (ref 3.5–5.0)
Alkaline Phosphatase: 58 U/L (ref 38–126)
Anion gap: 10 (ref 5–15)
BUN: 10 mg/dL (ref 8–23)
CO2: 23 mmol/L (ref 22–32)
Calcium: 9.1 mg/dL (ref 8.9–10.3)
Chloride: 104 mmol/L (ref 98–111)
Creatinine, Ser: 0.65 mg/dL (ref 0.44–1.00)
GFR calc Af Amer: >60 mL/min (ref >60)
GFR calc non Af Amer: >60 mL/min (ref >60)
Glucose, Bld: 173 mg/dL — ABNORMAL HIGH (ref 70–99)
Potassium: 4 mmol/L (ref 3.5–5.1)
Sodium: 137 mmol/L (ref 135–145)
Total Bilirubin: 0.7 mg/dL (ref 0.3–1.2)
Total Protein: 7 g/dL (ref 6.5–8.1)

## 2018-06-05 LAB — GLUCOSE, CAPILLARY
Glucose-Capillary: 148 mg/dL — ABNORMAL HIGH (ref 70–99)
Glucose-Capillary: 207 mg/dL — ABNORMAL HIGH (ref 70–99)
Glucose-Capillary: 119 mg/dL — ABNORMAL HIGH (ref 70–99)
Glucose-Capillary: 198 mg/dL — ABNORMAL HIGH (ref 70–99)

## 2018-06-05 LAB — VANCOMYCIN, PEAK: Vancomycin Pk: 28 ug/mL — ABNORMAL LOW (ref 30–40)

## 2018-06-05 MED ORDER — AMLODIPINE BESYLATE 5 MG PO TABS
5.0000 mg | ORAL_TABLET | Freq: Every day | ORAL | Status: DC
  Administered 2018-06-05 – 2018-06-06 (×2): 5 mg via ORAL
  Filled 2018-06-05 (×2): qty 1

## 2018-06-05 NOTE — Progress Notes (Signed)
Attempted to contact spouse, Penelopy Birnbaum re PICC consent.  No answer.

## 2018-06-05 NOTE — Progress Notes (Signed)
PROGRESS NOTE  TRANIECE BOFFA ZOX:[id number] DOB: 02-Sep-1945 DOA: 05/31/2018 PCP: Latrelle Dodrill, MD  HPI/Recap of past 24 hours: 73 y.o.female,with history of dementia, hyperlipidemia, hypertension, diabetes mellitus type 2, seizure disorder came to hospital after patient fell at home. In the ED patient was found to be febrile with a temp of 101.9. She had mildly abnormal UA and started on ceftriaxone and Zithromax for possible UTI and questionable pneumonia. Her O2 sats were 91% on room air.Influenza PCR came back positive for influenza A. Patient also found to have acute/subacute stroke.   06/05/2018: Patient seen and examined at bedside.  No acute events overnight.  Vital signs and lab studies are unremarkable.  Awaiting completion of TEE at [facility] possibly tomorrow.    Assessment/Plan: Principal Problem:   MRSA bacteremia Active Problems:   Diabetes mellitus, type II (HCC)   Hypertension   Hyperlipidemia   Probable Alzheimer's dementia without behavioral disturbance   Seizures (HCC)   Influenza A  MRSA sepsis/Influenza A- Presented with fever, influenza PCR positive for influenza A, patient empirically started on antibiotics to prevent superimposed infection but ID felt she has no evidence off pneumonia on chest x-ray and clinically. discontinued ceftriaxone and Zithromax.continueonTamiflu 75 mg p.o. twice daily.  blood culture positive for gram-positive cocci,  MRSA. Source unclear. 2-D echo without vegetations. ID recommends TEE, placed cardiology consult and notified fellow on call. Patient started on vancomycin IV.ID will determine duration of treatment. Repeat blood cultures from 2/ 6 no growth so far. Ok to place PICC for continuation of iv vancomycin.  Sepsis physiology is improving.  Vital signs and lab studies stable.  2. ? UTI-patient has mildly abnormal UA, denies any symptoms. urine culture nonspecific  3. Diabetes mellitus type  2-continue with Lantus 18 units subcu daily,Continuesliding scale insulin with NovoLog. hemoglobin A1c 8.9. Triglycerides 363, LDL not calculated, HDL less than 10  4. Dementia-no behavior disturbance, continue Aricept.  Stable.  5. Acute CVA MRI of the brain  Confirmed acute/subacute infarction . Neurology notified by NP last night, he recommended focused TIA order set. Increase aspirin to 325 mg a day. Started  patient on a statin Lipitor 80 mg daily. Telemetry showed normal sinus rhythm.discuss with Dr.Arora, he recommend CTA head and neck. He does not see any indication to transfer patient to [facility]. Patient  seen by PT OT speech . PT recommends SNF. Follow-up with outpatient neurology stroke clinic at [facility] neurology in 4 to 6 weeks after discharge  6. History of seizures-continue Keppra.  Stable.  7. Prolonged QTc interval-QTC is prolonged to 525, will start cardiac monitoring. Magnesium 1.8 . Magnesium oxide   8. Fall/ bruising-patient noted to have bruising on her bilateral knees and lips. She is unable to provide any history regarding falls.  Patient's husband is debilitated and unable to take care of her. She will need SNF   9. Abnormal troponin-suspect secondary to demand ischemia in the setting of influenza/acute bronchitis/sepsis/CVA . 2-D echo did not show any significant   wall motion abnormalities. 10. Physical debility/ambulatory dysfunction: PT recommend SNF.  CSW consulted for placement.    DVT prophylaxsis Lovenox  Code Status:  Full code    Family Communication: Discussed in detail with the patient /son/husband, all imaging results, lab results explained to the patient   Disposition Plan: SNF Monday      Consultants:  Infectious disease  Procedures:  none      Objective: Vitals:   06/04/18 1253 06/04/18 2220 06/05/18  0519 06/05/18 0548  BP: (!) 168/84 (!) 145/83 (!) 208/76 (!) 159/55  Pulse: (!) 56 68 63   Resp: 16 18 18 18     Temp: 98.3 F (36.8 C) 98.2 F (36.8 C) 98 F (36.7 C)   TempSrc: Oral Oral Oral   SpO2: 96% 95% 96% 100%  Weight:      Height:        Intake/Output Summary (Last 24 hours) at 06/05/2018 1252 Last data filed at 06/05/2018 1000 Gross per 24 hour  Intake 440 ml  Output 900 ml  Net -460 ml   Filed Weights   06/01/18 0215 06/01/18 0444  Weight: 100.7 kg 100.6 kg    Exam:  . General: 73 y.o. year-old female well-developed well-nourished no acute distress.  Somnolent but easily arousable to voice. . Cardiovascular: Regular rate and rhythm with no rubs or gallops.  No JVD or thyromegaly. [name]. Respiratory: Clear to Auscultation with No Wheezes or Rales.  Poor inspiratory effort.   . Abdomen: Soft nontender nondistended with normal bowel sounds x4 quadrants. . Psychiatry: Mood is appropriate for condition and setting   Data Reviewed: CBC: Recent Labs  Lab 05/31/18 2140 06/01/18 0911 06/03/18 0321 06/04/18 0804 06/05/18 0321  WBC 13.0* 7.4 3.1* 4.5 5.0  NEUTROABS 11.2*  --   --   --   --   HGB 12.9 11.9* 11.4* 13.1 12.5  HCT 41.1 39.6 37.6 39.8 40.6  MCV 89.5 93.0 90.8 84.7 89.0  PLT 268 224 224 273 262   Basic Metabolic Panel: Recent Labs  Lab 06/01/18 0746 06/01/18 0911 06/02/18 0347 06/03/18 0321 06/04/18 0804 06/05/18 0321  NA  --  135 135 136 136 137  K  --  3.2* 3.5 3.9 5.1 4.0  CL  --  102 104 106 106 104  CO2  --  23 24 25  21* 23  GLUCOSE  --  217* 139* 168* 153* 173*  BUN  --  10 9 10 8 10   CREATININE  --  0.70 0.72 0.70 0.61 0.65  CALCIUM  --  8.4* 8.2* 8.4* 8.7* 9.1  MG 1.8  --   --   --  1.9  --    GFR: Estimated Creatinine Clearance: 80 mL/min (by C-G formula based on SCr of 0.65 mg/dL). Liver Function Tests: Recent Labs  Lab 05/31/18 2140 06/01/18 0911 06/03/18 0321 06/04/18 0804 06/05/18 0321  AST 41 59* 42* 44* 36  ALT 22 23 21 22 19   ALKPHOS 81 73 60 60 58  BILITOT 1.1 0.5 0.4 0.7 0.7  PROT 8.0 7.0 6.5 7.2 7.0  ALBUMIN 4.0 3.4* 3.0*  3.3* 3.4*   No results for input(s): LIPASE, AMYLASE in the last 168 hours. No results for input(s): AMMONIA in the last 168 hours. Coagulation Profile: No results for input(s): INR, PROTIME in the last 168 hours. Cardiac Enzymes: Recent Labs  Lab 05/31/18 2140 06/01/18 0746 06/01/18 1400  TROPONINI 0.03* 0.10* 0.06*   BNP (last 3 results) No results for input(s): PROBNP in the last 8760 hours. HbA1C: Recent Labs    06/03/18 0321  HGBA1C 8.9*   CBG: Recent Labs  Lab 06/04/18 1129 06/04/18 1626 06/04/18 2217 06/05/18 0746 06/05/18 1132  GLUCAP 170* 137* 147* 148* 207*   Lipid Profile: Recent Labs    06/03/18 0321  CHOL 140  HDL <10*  LDLCALC NOT CALCULATED  TRIG 363*  CHOLHDL NOT CALCULATED   Thyroid Function Tests: No results for input(s): TSH, T4TOTAL, FREET4, T3FREE, THYROIDAB  in the last 72 hours. Anemia Panel: No results for input(s): VITAMINB12, FOLATE, FERRITIN, TIBC, IRON, RETICCTPCT in the last 72 hours. Urine analysis:    Component Value Date/Time   COLORURINE YELLOW 05/31/2018 2317   APPEARANCEUR HAZY (A) 05/31/2018 2317   LABSPEC 1.025 05/31/2018 2317   PHURINE 5.0 05/31/2018 2317   GLUCOSEU 50 (A) 05/31/2018 2317   HGBUR SMALL (A) 05/31/2018 2317   BILIRUBINUR NEGATIVE 05/31/2018 2317   BILIRUBINUR small 06/11/2016 1421   KETONESUR 5 (A) 05/31/2018 2317   PROTEINUR >=300 (A) 05/31/2018 2317   UROBILINOGEN >=8.0 06/11/2016 1421   NITRITE NEGATIVE 05/31/2018 2317   LEUKOCYTESUR NEGATIVE 05/31/2018 2317   Sepsis Labs: @LABRCNTIP (procalcitonin:4,lacticidven:4)  ) Recent Results (from the past 240 hour(s))  Blood Culture (routine x 2)     Status: None (Preliminary result)   Collection Time: 05/31/18  9:41 PM  Result Value Ref Range Status   Specimen Description   Final    BLOOD RIGHT ANTECUBITAL Performed at [facility], [address] [place], [place] [address]    Special Requests   Final    BOTTLES DRAWN AEROBIC  AND ANAEROBIC Blood Culture adequate volume Performed at [facility], [address], [place], [place] [address]    Culture   Final    NO GROWTH 4 DAYS Performed at [facility], [address], [place], [place] [address]    Report Status PENDING  Incomplete  Blood Culture (routine x 2)     Status: Abnormal   Collection Time: 05/31/18  9:59 PM  Result Value Ref Range Status   Specimen Description   Final    BLOOD LEFT HAND Performed at [facility], [address], [place], [place] [address]    Special Requests   Final    BOTTLES DRAWN AEROBIC AND ANAEROBIC Blood Culture adequate volume Performed at [facility], [address], [place], [place] [address]    Culture  Setup Time   Final    GRAM POSITIVE COCCI IN CLUSTERS IN BOTH AEROBIC AND ANAEROBIC BOTTLES CRITICAL RESULT CALLED TO, READ BACK BY AND VERIFIED WITH: Jodi Marble PharmD 16:30 06/01/18 ([email or web address]) Performed at [facility], [address] [place], [place] [address]    Culture STAPHYLOCOCCUS AUREUS (A)  Final   Report Status 06/03/2018 FINAL  Final   Organism ID, Bacteria STAPHYLOCOCCUS AUREUS  Final      Susceptibility   Staphylococcus aureus - MIC*    CIPROFLOXACIN <=0.5 SENSITIVE Sensitive     ERYTHROMYCIN >=8 RESISTANT Resistant     GENTAMICIN <=0.5 SENSITIVE Sensitive     OXACILLIN <=0.25 SENSITIVE Sensitive     TETRACYCLINE <=1 SENSITIVE Sensitive     VANCOMYCIN 1 SENSITIVE Sensitive     TRIMETH/SULFA <=10 SENSITIVE Sensitive     CLINDAMYCIN <=0.25 SENSITIVE Sensitive     RIFAMPIN <=0.5 SENSITIVE Sensitive     Inducible Clindamycin NEGATIVE Sensitive     * STAPHYLOCOCCUS AUREUS  Blood Culture ID Panel (Reflexed)     Status: Abnormal   Collection Time: 05/31/18  9:59 PM  Result Value Ref Range Status   Enterococcus species NOT DETECTED NOT DETECTED Final   Listeria monocytogenes NOT DETECTED NOT DETECTED Final   Staphylococcus species  DETECTED (A) NOT DETECTED Final    Comment: CRITICAL RESULT CALLED TO, READ BACK BY AND VERIFIED WITH: Jodi Marble PharmD 16:30 06/01/18 ([email or web address])    Staphylococcus aureus (BCID) DETECTED (A) NOT DETECTED Final  Comment: Methicillin (oxacillin)-resistant Staphylococcus aureus (MRSA). MRSA is predictably resistant to beta-lactam antibiotics (except ceftaroline). Preferred therapy is vancomycin unless clinically contraindicated. Patient requires contact precautions if  hospitalized. CRITICAL RESULT CALLED TO, READ BACK BY AND VERIFIED WITH: Jodi Marble PharmD 16:30 06/01/18 ([email or web address])    Methicillin resistance DETECTED (A) NOT DETECTED Final    Comment: RESULT CALLED TO, READ BACK BY AND VERIFIED WITH: Jodi Marble PharmD 16:30 06/01/18 ([email or web address])    Streptococcus species NOT DETECTED NOT DETECTED Final   Streptococcus agalactiae NOT DETECTED NOT DETECTED Final   Streptococcus pneumoniae NOT DETECTED NOT DETECTED Final   Streptococcus pyogenes NOT DETECTED NOT DETECTED Final   Acinetobacter baumannii NOT DETECTED NOT DETECTED Final   Enterobacteriaceae species NOT DETECTED NOT DETECTED Final   Enterobacter cloacae complex NOT DETECTED NOT DETECTED Final   Escherichia coli NOT DETECTED NOT DETECTED Final   Klebsiella oxytoca NOT DETECTED NOT DETECTED Final   Klebsiella pneumoniae NOT DETECTED NOT DETECTED Final   Proteus species NOT DETECTED NOT DETECTED Final   Serratia marcescens NOT DETECTED NOT DETECTED Final   Haemophilus influenzae NOT DETECTED NOT DETECTED Final   Neisseria meningitidis NOT DETECTED NOT DETECTED Final   Pseudomonas aeruginosa NOT DETECTED NOT DETECTED Final   Candida albicans NOT DETECTED NOT DETECTED Final   Candida glabrata NOT DETECTED NOT DETECTED Final   Candida krusei NOT DETECTED NOT DETECTED Final   Candida parapsilosis NOT DETECTED NOT DETECTED Final   Candida tropicalis NOT DETECTED NOT DETECTED Final    Comment: Performed at [facility],  [address], [place], [place] [address]  Culture, Urine     Status: Abnormal   Collection Time: 06/01/18  4:47 AM  Result Value Ref Range Status   Specimen Description   Final    URINE, CLEAN CATCH Performed at [facility], [address] [place], [place] [address]    Special Requests   Final    NONE Performed at [facility], [address], [place], [place] [address]    Culture MULTIPLE SPECIES PRESENT, SUGGEST RECOLLECTION (A)  Final   Report Status 06/02/2018 FINAL  Final  Culture, blood (routine x 2)     Status: None (Preliminary result)   Collection Time: 06/02/18  8:03 AM  Result Value Ref Range Status   Specimen Description   Final    BLOOD RIGHT HAND Performed at [facility], [address] [place], [place] [address]    Special Requests   Final    BOTTLES DRAWN AEROBIC ONLY Blood Culture results may not be optimal due to an inadequate volume of blood received in culture bottles Performed at [facility], [address], [place], [place] [address]    Culture   Final    NO GROWTH 2 DAYS Performed at [facility], [address] [place], [place] [address]    Report Status PENDING  Incomplete  Culture, blood (routine x 2)     Status: None (Preliminary result)   Collection Time: 06/02/18  8:03 AM  Result Value Ref Range Status   Specimen Description   Final    BLOOD RIGHT HAND Performed at [facility], [address], [place], [place] [address]    Special Requests   Final    BOTTLES DRAWN AEROBIC ONLY Blood Culture adequate volume Performed at [facility], [address] [place], [place] [address]    Culture   Final    NO  GROWTH 2 DAYS Performed at [facility], [address] [place], [place] [address]    Report Status PENDING  Incomplete      Studies: No results found.  Scheduled Meds: . amLODipine  5 mg Oral Daily  . aspirin   325 mg Oral Daily  . atorvastatin  80 mg Oral q1800  . benzonatate  100 mg Oral TID  . donepezil  5 mg Oral QHS  . enoxaparin (LOVENOX) injection  40 mg Subcutaneous Daily  . guaiFENesin  600 mg Oral BID  . insulin aspart  0-9 Units Subcutaneous TID WC  . insulin glargine  18 Units Subcutaneous Daily  . levETIRAcetam  500 mg Oral BID  . loratadine  10 mg Oral Daily  . magnesium oxide  400 mg Oral Daily  . metoprolol tartrate  25 mg Oral BID  . saccharomyces boulardii  250 mg Oral BID    Continuous Infusions: . [START ON 06/06/2018] sodium chloride    . vancomycin 1,250 mg (06/04/18 1720)     LOS: 4 days     Darlin Drop, MD [facility] Pager [phone]  If 7PM-7AM, please contact night-coverage [email or web address] Password [email or web address] 06/05/2018, 12:52 PM

## 2018-06-06 LAB — GLUCOSE, CAPILLARY
Glucose-Capillary: 144 mg/dL — ABNORMAL HIGH (ref 70–99)
Glucose-Capillary: 134 mg/dL — ABNORMAL HIGH (ref 70–99)
Glucose-Capillary: 134 mg/dL — ABNORMAL HIGH (ref 70–99)

## 2018-06-06 LAB — CBC
HCT: 43.9 % (ref 36.0–46.0)
Hemoglobin: 13.2 g/dL (ref 12.0–15.0)
MCH: 28.1 pg (ref 26.0–34.0)
MCHC: 30.1 g/dL (ref 30.0–36.0)
MCV: 93.4 fL (ref 80.0–100.0)
Platelets: 248 10*3/uL (ref 150–400)
RBC: 4.7 MIL/uL (ref 3.87–5.11)
RDW: 13.3 % (ref 11.5–15.5)
WBC: 5 10*3/uL (ref 4.0–10.5)
nRBC: 0 % (ref 0.0–0.2)

## 2018-06-06 LAB — COMPREHENSIVE METABOLIC PANEL
ALT: 21 U/L (ref 0–44)
AST: 35 U/L (ref 15–41)
Albumin: 3.4 g/dL — ABNORMAL LOW (ref 3.5–5.0)
Alkaline Phosphatase: 62 U/L (ref 38–126)
Anion gap: 10 (ref 5–15)
BUN: 10 mg/dL (ref 8–23)
CO2: 23 mmol/L (ref 22–32)
Calcium: 9 mg/dL (ref 8.9–10.3)
Chloride: 102 mmol/L (ref 98–111)
Creatinine, Ser: 0.64 mg/dL (ref 0.44–1.00)
GFR calc Af Amer: >60 mL/min (ref >60)
GFR calc non Af Amer: >60 mL/min (ref >60)
Glucose, Bld: 156 mg/dL — ABNORMAL HIGH (ref 70–99)
Potassium: 4 mmol/L (ref 3.5–5.1)
Sodium: 135 mmol/L (ref 135–145)
Total Bilirubin: 0.7 mg/dL (ref 0.3–1.2)
Total Protein: 7.4 g/dL (ref 6.5–8.1)

## 2018-06-06 MED ORDER — SODIUM CHLORIDE 0.9% FLUSH
10.0000 mL | INTRAVENOUS | Status: DC | PRN
  Administered 2018-06-08: 10 mL
  Filled 2018-06-06: qty 40

## 2018-06-06 NOTE — Progress Notes (Signed)
Patient ID: Paula Reed, female   DOB: 07/04/1945, 73 y.o.   MRN: [id number]         [facility]  Date of Admission:  05/31/2018           Day 6 vancomycin ASSESSMENT: Paula Reed is improving after completing therapy for influenza and on treatment for MRSA bacteremia.  Repeat blood cultures on 06/02/2018 remain negative.  TEE is planned for tomorrow.  Once I have those results I can leave a final recommendation on optimal duration of vancomycin therapy.  PLAN: 1. Continue vancomycin 2. Await results of TEE 3. Discontinue droplet precautions  Principal Problem:   MRSA bacteremia Active Problems:   Influenza A   Diabetes mellitus, type II (HCC)   Hypertension   Hyperlipidemia   Probable Alzheimer's dementia without behavioral disturbance   Seizures (HCC)   Scheduled Meds: . amLODipine  5 mg Oral Daily  . aspirin  325 mg Oral Daily  . atorvastatin  80 mg Oral q1800  . donepezil  5 mg Oral QHS  . enoxaparin (LOVENOX) injection  40 mg Subcutaneous Daily  . guaiFENesin  600 mg Oral BID  . insulin aspart  0-9 Units Subcutaneous TID WC  . insulin glargine  18 Units Subcutaneous Daily  . levETIRAcetam  500 mg Oral BID  . loratadine  10 mg Oral Daily  . magnesium oxide  400 mg Oral Daily  . metoprolol tartrate  25 mg Oral BID  . saccharomyces boulardii  250 mg Oral BID   Continuous Infusions: . sodium chloride    . vancomycin 1,250 mg (06/05/18 1637)   PRN Meds:.acetaminophen **OR** acetaminophen, albuterol, MUSCLE RUB, ondansetron **OR** ondansetron (ZOFRAN) IV, sodium chloride flush   SUBJECTIVE: Paula Reed is feeling better.  She denies any cough, rhinorrhea or sinus congestion.  Review of Systems: Review of Systems  Constitutional: Negative for chills, diaphoresis and fever.  HENT: Negative for congestion and sore throat.   Respiratory: Negative for cough, sputum production and shortness of breath.   Gastrointestinal: Negative for  abdominal pain, diarrhea, nausea and vomiting.    Allergies  Allergen Reactions  . Chlorthalidone Rash    Per [facility] records, pt may have had a rash rxn to chlorthalidone    OBJECTIVE: Vitals:   06/05/18 1356 06/05/18 2216 06/06/18 0458 06/06/18 1310  BP: (!) 164/82 (!) 161/68 (!) 157/72 (!) 147/68  Pulse: 77 89 63 66  Resp: 20 16 16 18   Temp: 98.4 F (36.9 C) 98.2 F (36.8 C) 98.1 F (36.7 C) 98.4 F (36.9 C)  TempSrc: Oral Oral Oral Oral  SpO2: 94% 95% 96% 96%  Weight:      Height:       Body mass index is 33.72 kg/m.  Physical Exam Constitutional:      Comments: She is resting quietly in bed watching television  Cardiovascular:     Rate and Rhythm: Normal rate and regular rhythm.     Heart sounds: No murmur.  Pulmonary:     Effort: Pulmonary effort is normal.     Breath sounds: Normal breath sounds.  Psychiatric:        Mood and Affect: Mood normal.     Lab Results Lab Results  Component Value Date   WBC 5.0 06/06/2018   HGB 13.2 06/06/2018   HCT 43.9 06/06/2018   MCV 93.4 06/06/2018   PLT 248 06/06/2018    Lab Results  Component Value Date   CREATININE 0.64 06/06/2018  BUN 10 06/06/2018   NA 135 06/06/2018   K 4.0 06/06/2018   CL 102 06/06/2018   CO2 23 06/06/2018    Lab Results  Component Value Date   ALT 21 06/06/2018   AST 35 06/06/2018   ALKPHOS 62 06/06/2018   BILITOT 0.7 06/06/2018     Microbiology: Recent Results (from the past 240 hour(s))  Blood Culture (routine x 2)     Status: None   Collection Time: 05/31/18  9:41 PM  Result Value Ref Range Status   Specimen Description   Final    BLOOD RIGHT ANTECUBITAL Performed at [facility], [address], [place], [place] [address]    Special Requests   Final    BOTTLES DRAWN AEROBIC AND ANAEROBIC Blood Culture adequate volume Performed at [facility], [address], [place], [place] [address]    Culture   Final    NO GROWTH 5  DAYS Performed at [facility], [address], [place], [place] [address]    Report Status 06/05/2018 FINAL  Final  Blood Culture (routine x 2)     Status: Abnormal   Collection Time: 05/31/18  9:59 PM  Result Value Ref Range Status   Specimen Description   Final    BLOOD LEFT HAND Performed at [facility], [address], [place], [place] [address]    Special Requests   Final    BOTTLES DRAWN AEROBIC AND ANAEROBIC Blood Culture adequate volume Performed at [facility], [address], [place], [place] [address]    Culture  Setup Time   Final    GRAM POSITIVE COCCI IN CLUSTERS IN BOTH AEROBIC AND ANAEROBIC BOTTLES CRITICAL RESULT CALLED TO, READ BACK BY AND VERIFIED WITH: Jodi Marble PharmD 16:30 06/01/18 ([email or web address]) Performed at [facility], [address] [place], [place] [address]    Culture STAPHYLOCOCCUS AUREUS (A)  Final   Report Status 06/03/2018 FINAL  Final   Organism ID, Bacteria STAPHYLOCOCCUS AUREUS  Final      Susceptibility   Staphylococcus aureus - MIC*    CIPROFLOXACIN <=0.5 SENSITIVE Sensitive     ERYTHROMYCIN >=8 RESISTANT Resistant     GENTAMICIN <=0.5 SENSITIVE Sensitive     OXACILLIN <=0.25 SENSITIVE Sensitive     TETRACYCLINE <=1 SENSITIVE Sensitive     VANCOMYCIN 1 SENSITIVE Sensitive     TRIMETH/SULFA <=10 SENSITIVE Sensitive     CLINDAMYCIN <=0.25 SENSITIVE Sensitive     RIFAMPIN <=0.5 SENSITIVE Sensitive     Inducible Clindamycin NEGATIVE Sensitive     * STAPHYLOCOCCUS AUREUS  Blood Culture ID Panel (Reflexed)     Status: Abnormal   Collection Time: 05/31/18  9:59 PM  Result Value Ref Range Status   Enterococcus species NOT DETECTED NOT DETECTED Final   Listeria monocytogenes NOT DETECTED NOT DETECTED Final   Staphylococcus species DETECTED (A) NOT DETECTED Final    Comment: CRITICAL RESULT CALLED TO, READ BACK BY AND VERIFIED WITH: Jodi Marble PharmD 16:30 06/01/18 ([email or web address])    Staphylococcus  aureus (BCID) DETECTED (A) NOT DETECTED Final    Comment: Methicillin (oxacillin)-resistant Staphylococcus aureus (MRSA). MRSA is predictably resistant to beta-lactam antibiotics (except ceftaroline). Preferred therapy is vancomycin unless clinically contraindicated. Patient requires contact precautions if  hospitalized. CRITICAL RESULT CALLED TO, READ BACK BY AND VERIFIED WITH: Jodi Marble PharmD 16:30 06/01/18 ([email or web address])    Methicillin resistance DETECTED (A) NOT DETECTED Final    Comment: RESULT CALLED TO, READ BACK  BY AND VERIFIED WITH: Jodi MarbleL. Poindexter PharmD 16:30 06/01/18 ([email or web address])    Streptococcus species NOT DETECTED NOT DETECTED Final   Streptococcus agalactiae NOT DETECTED NOT DETECTED Final   Streptococcus pneumoniae NOT DETECTED NOT DETECTED Final   Streptococcus pyogenes NOT DETECTED NOT DETECTED Final   Acinetobacter baumannii NOT DETECTED NOT DETECTED Final   Enterobacteriaceae species NOT DETECTED NOT DETECTED Final   Enterobacter cloacae complex NOT DETECTED NOT DETECTED Final   Escherichia coli NOT DETECTED NOT DETECTED Final   Klebsiella oxytoca NOT DETECTED NOT DETECTED Final   Klebsiella pneumoniae NOT DETECTED NOT DETECTED Final   Proteus species NOT DETECTED NOT DETECTED Final   Serratia marcescens NOT DETECTED NOT DETECTED Final   Haemophilus influenzae NOT DETECTED NOT DETECTED Final   Neisseria meningitidis NOT DETECTED NOT DETECTED Final   Pseudomonas aeruginosa NOT DETECTED NOT DETECTED Final   Candida albicans NOT DETECTED NOT DETECTED Final   Candida glabrata NOT DETECTED NOT DETECTED Final   Candida krusei NOT DETECTED NOT DETECTED Final   Candida parapsilosis NOT DETECTED NOT DETECTED Final   Candida tropicalis NOT DETECTED NOT DETECTED Final    Comment: Performed at [facility], [address] [place], [place] [address]  Culture, Urine     Status: Abnormal   Collection Time: 06/01/18  4:47 AM  Result Value Ref Range Status   Specimen Description    Final    URINE, CLEAN CATCH Performed at [facility], [address] [place], [place] [address]    Special Requests   Final    NONE Performed at [facility], [address] [place], [place] [address]    Culture MULTIPLE SPECIES PRESENT, SUGGEST RECOLLECTION (A)  Final   Report Status 06/02/2018 FINAL  Final  Culture, blood (routine x 2)     Status: None (Preliminary result)   Collection Time: 06/02/18  8:03 AM  Result Value Ref Range Status   Specimen Description   Final    BLOOD RIGHT HAND Performed at [facility], [address] [place], [place] [address]    Special Requests   Final    BOTTLES DRAWN AEROBIC ONLY Blood Culture results may not be optimal due to an inadequate volume of blood received in culture bottles Performed at [facility], [address] [place], [place] [address]    Culture   Final    NO GROWTH 4 DAYS Performed at [facility], [address] [place], [place] [address]    Report Status PENDING  Incomplete  Culture, blood (routine x 2)     Status: None (Preliminary result)   Collection Time: 06/02/18  8:03 AM  Result Value Ref Range Status   Specimen Description   Final    BLOOD RIGHT HAND Performed at [facility], [address] [place], [place] [address]    Special Requests   Final    BOTTLES DRAWN AEROBIC ONLY Blood Culture adequate volume Performed at [facility], [address] [place], [place] [address]    Culture   Final    NO GROWTH 4 DAYS Performed at [facility], [address] [place], [place] [address]    Report Status PENDING  Incomplete    Cliffton AstersJohn Jacarie Pate, MD [facility] [phone] pager   [phone] 06/06/2018, 3:43 PM

## 2018-06-06 NOTE — Progress Notes (Signed)
 PROGRESS NOTE  Paula Reed MRN:[id number] DOB: 05/08/1945 DOA: 05/31/2018 PCP: McIntyre, Brittany J, MD  HPI/Recap of past 24 hours: 72 y.o.female,with history of dementia, hyperlipidemia, hypertension, diabetes mellitus type 2, seizure disorder came to hospital after patient fell at home. In the ED patient was found to be febrile with a temp of 101.9. She had mildly abnormal UA and started on ceftriaxone and Zithromax for possible UTI and questionable pneumonia. Her O2 sats were 91% on room air.Influenza PCR came back positive for influenza A. Patient also found to have acute/subacute stroke.   06/05/2018: Patient seen and examined at bedside.  No acute events overnight.  Vital signs and lab studies are unremarkable.  Awaiting completion of TEE at [facility] possibly tomorrow.  06/06/2018: Patient seen and examined at bedside.  No acute events overnight.  She has no new complaints.  Awaiting PICC line placement and TEE at [place] which will be done tomorrow.    Assessment/Plan: Principal Problem:   MRSA bacteremia Active Problems:   Diabetes mellitus, type II (HCC)   Hypertension   Hyperlipidemia   Probable Alzheimer's dementia without behavioral disturbance   Seizures (HCC)   Influenza A  MRSA sepsis/Influenza A- Presented with fever, influenza PCR positive for influenza A, patient empirically started on antibiotics to prevent superimposed infection but ID felt she has no evidence off pneumonia on chest x-ray and clinically. discontinued ceftriaxone and Zithromax.continueonTamiflu 75 mg p.o. twice daily.  blood culture positive for gram-positive cocci,  MRSA. Source unclear. 2-D echo without vegetations. ID recommends TEE, placed cardiology consult and notified fellow on call. Patient started on vancomycin IV. ID will determine duration of treatment. Repeat blood cultures from 2/ 6 no growth so far. Sepsis physiology is improving.  Reviewed vital signs and labs  which are stable.  PICC line will be placed today 06/06/2018.  TEE will be done tomorrow 06/07/2018 at [facility].  2. ? UTI-patient has mildly abnormal UA, denies any symptoms. urine culture nonspecific  3. Diabetes mellitus type 2-continue with Lantus 18 units subcu daily,Continuesliding scale insulin with NovoLog. hemoglobin A1c 8.9. Triglycerides 363, LDL not calculated, HDL less than 10  4. Dementia-no behavior disturbance, continue Aricept.  Stable.  5. Acute CVA MRI of the brain  Confirmed acute/subacute infarction . Neurology notified by NP last night, he recommended focused TIA order set. Increase aspirin to 325 mg a day. Started  patient on a statin Lipitor 80 mg daily. Telemetry showed normal sinus rhythm.discuss with Dr.Arora, he recommend CTA head and neck. He does not see any indication to transfer patient to [facility]. Patient  seen by PT OT speech . PT recommends SNF. Follow-up with outpatient neurology stroke clinic at [facility] in 4 to 6 weeks after discharge  6. History of seizures-continue Keppra.  Stable.  7. Prolonged QTc interval-QTC is prolonged to 525, will start cardiac monitoring. Magnesium 1.8 . Magnesium oxide   8. Fall/ bruising-patient noted to have bruising on her bilateral knees and lips. She is unable to provide any history regarding falls.  Patient's husband is debilitated and unable to take care of her. She will need SNF   9. Abnormal troponin-suspect secondary to demand ischemia in the setting of influenza/acute bronchitis/sepsis/CVA . 2-D echo did not show any significant   wall motion abnormalities. 10. Physical debility/ambulatory dysfunction: PT recommend SNF.  CSW consulted for placement.    DVT prophylaxsis Lovenox  Code Status:  Full code    Family Communication: Discussed in detail   with the patient /son/husband, all imaging results, lab results explained to the patient   Disposition Plan: SNF Monday       Consultants:  Infectious disease  Procedures:  none      Objective: Vitals:   06/05/18 1356 06/05/18 2216 06/06/18 0458 06/06/18 1310  BP: (!) 164/82 (!) 161/68 (!) 157/72 (!) 147/68  Pulse: 77 89 63 66  Resp: 20 16 16 18  Temp: 98.4 F (36.9 C) 98.2 F (36.8 C) 98.1 F (36.7 C) 98.4 F (36.9 C)  TempSrc: Oral Oral Oral Oral  SpO2: 94% 95% 96% 96%  Weight:      Height:        Intake/Output Summary (Last 24 hours) at 06/06/2018 1715 Last data filed at 06/06/2018 1327 Gross per 24 hour  Intake 220 ml  Output 1500 ml  Net -1280 ml   Filed Weights   06/01/18 0215 06/01/18 0444  Weight: 100.7 kg 100.6 kg    Exam:  . General: 72 y.o. year-old female well-developed well-nourished in no acute distress.  Alert and interactive. . Cardiovascular: Regular rate and rhythm with no rubs or gallops.  No JVD or thyromegaly . Respiratory: Clear to auscultation with no wheezes or rales.  Poor inspiratory effort. . Abdomen: Soft nontender nondistended with normal bowel sounds x4 quadrants. . Psychiatry: Mood is appropriate for condition and setting   Data Reviewed: CBC: Recent Labs  Lab 05/31/18 2140 06/01/18 0911 06/03/18 0321 06/04/18 0804 06/05/18 0321 06/06/18 0719  WBC 13.0* 7.4 3.1* 4.5 5.0 5.0  NEUTROABS 11.2*  --   --   --   --   --   HGB 12.9 11.9* 11.4* 13.1 12.5 13.2  HCT 41.1 39.6 37.6 39.8 40.6 43.9  MCV 89.5 93.0 90.8 84.7 89.0 93.4  PLT 268 224 224 273 262 248   Basic Metabolic Panel: Recent Labs  Lab 06/01/18 0746  06/02/18 0347 06/03/18 0321 06/04/18 0804 06/05/18 0321 06/06/18 0719  NA  --    < > 135 136 136 137 135  K  --    < > 3.5 3.9 5.1 4.0 4.0  CL  --    < > 104 106 106 104 102  CO2  --    < > 24 25 21* 23 23  GLUCOSE  --    < > 139* 168* 153* 173* 156*  BUN  --    < > 9 10 8 10 10  CREATININE  --    < > 0.72 0.70 0.61 0.65 0.64  CALCIUM  --    < > 8.2* 8.4* 8.7* 9.1 9.0  MG 1.8  --   --   --  1.9  --   --    < > = values in  this interval not displayed.   GFR: Estimated Creatinine Clearance: 80 mL/min (by C-G formula based on SCr of 0.64 mg/dL). Liver Function Tests: Recent Labs  Lab 06/01/18 0911 06/03/18 0321 06/04/18 0804 06/05/18 0321 06/06/18 0719  AST 59* 42* 44* 36 35  ALT 23 21 22 19 21  ALKPHOS 73 60 60 58 62  BILITOT 0.5 0.4 0.7 0.7 0.7  PROT 7.0 6.5 7.2 7.0 7.4  ALBUMIN 3.4* 3.0* 3.3* 3.4* 3.4*   No results for input(s): LIPASE, AMYLASE in the last 168 hours. No results for input(s): AMMONIA in the last 168 hours. Coagulation Profile: No results for input(s): INR, PROTIME in the last 168 hours. Cardiac Enzymes: Recent Labs  Lab 05/31/18 2140 06/01/18 0746   06/01/18 1400  TROPONINI 0.03* 0.10* 0.06*   BNP (last 3 results) No results for input(s): PROBNP in the last 8760 hours. HbA1C: No results for input(s): HGBA1C in the last 72 hours. CBG: Recent Labs  Lab 06/05/18 1132 06/05/18 1644 06/05/18 2244 06/06/18 0840 06/06/18 1157  GLUCAP 207* 119* 198* 144* 134*   Lipid Profile: No results for input(s): CHOL, HDL, LDLCALC, TRIG, CHOLHDL, LDLDIRECT in the last 72 hours. Thyroid Function Tests: No results for input(s): TSH, T4TOTAL, FREET4, T3FREE, THYROIDAB in the last 72 hours. Anemia Panel: No results for input(s): VITAMINB12, FOLATE, FERRITIN, TIBC, IRON, RETICCTPCT in the last 72 hours. Urine analysis:    Component Value Date/Time   COLORURINE YELLOW 05/31/2018 2317   APPEARANCEUR HAZY (A) 05/31/2018 2317   LABSPEC 1.025 05/31/2018 2317   PHURINE 5.0 05/31/2018 2317   GLUCOSEU 50 (A) 05/31/2018 2317   HGBUR SMALL (A) 05/31/2018 2317   BILIRUBINUR NEGATIVE 05/31/2018 2317   BILIRUBINUR small 06/11/2016 1421   KETONESUR 5 (A) 05/31/2018 2317   PROTEINUR >=300 (A) 05/31/2018 2317   UROBILINOGEN >=8.0 06/11/2016 1421   NITRITE NEGATIVE 05/31/2018 2317   LEUKOCYTESUR NEGATIVE 05/31/2018 2317   Sepsis Labs: @LABRCNTIP(procalcitonin:4,lacticidven:4)  ) Recent  Results (from the past 240 hour(s))  Blood Culture (routine x 2)     Status: None   Collection Time: 05/31/18  9:41 PM  Result Value Ref Range Status   Specimen Description   Final    BLOOD RIGHT ANTECUBITAL Performed at [facility], [address] [place], [place] [address]    Special Requests   Final    BOTTLES DRAWN AEROBIC AND ANAEROBIC Blood Culture adequate volume Performed at [facility], [address] [place], [place] [address]    Culture   Final    NO GROWTH 5 DAYS Performed at [facility], [address] [place], [place] [address]    Report Status 06/05/2018 FINAL  Final  Blood Culture (routine x 2)     Status: Abnormal   Collection Time: 05/31/18  9:59 PM  Result Value Ref Range Status   Specimen Description   Final    BLOOD LEFT HAND Performed at [facility], [address] [place], [place] [address]    Special Requests   Final    BOTTLES DRAWN AEROBIC AND ANAEROBIC Blood Culture adequate volume Performed at [facility], [address] [place], [place] [address]    Culture  Setup Time   Final    GRAM POSITIVE COCCI IN CLUSTERS IN BOTH AEROBIC AND ANAEROBIC BOTTLES CRITICAL RESULT CALLED TO, READ BACK BY AND VERIFIED WITH: L. Poindexter PharmD 16:30 06/01/18 ([email or web address]) Performed at [facility], [address] [place], [place] [address]    Culture STAPHYLOCOCCUS AUREUS (A)  Final   Report Status 06/03/2018 FINAL  Final   Organism ID, Bacteria STAPHYLOCOCCUS AUREUS  Final      Susceptibility   Staphylococcus aureus - MIC*    CIPROFLOXACIN <=0.5 SENSITIVE Sensitive     ERYTHROMYCIN >=8 RESISTANT Resistant     GENTAMICIN <=0.5 SENSITIVE Sensitive     OXACILLIN <=0.25 SENSITIVE Sensitive     TETRACYCLINE <=1 SENSITIVE Sensitive     VANCOMYCIN 1 SENSITIVE Sensitive     TRIMETH/SULFA <=10 SENSITIVE Sensitive     CLINDAMYCIN <=0.25 SENSITIVE Sensitive     RIFAMPIN <=0.5  SENSITIVE Sensitive     Inducible Clindamycin NEGATIVE Sensitive     * STAPHYLOCOCCUS AUREUS    Blood Culture ID Panel (Reflexed)     Status: Abnormal   Collection Time: 05/31/18  9:59 PM  Result Value Ref Range Status   Enterococcus species NOT DETECTED NOT DETECTED Final   Listeria monocytogenes NOT DETECTED NOT DETECTED Final   Staphylococcus species DETECTED (A) NOT DETECTED Final    Comment: CRITICAL RESULT CALLED TO, READ BACK BY AND VERIFIED WITH: L. Poindexter PharmD 16:30 06/01/18 ([email or web address])    Staphylococcus aureus (BCID) DETECTED (A) NOT DETECTED Final    Comment: Methicillin (oxacillin)-resistant Staphylococcus aureus (MRSA). MRSA is predictably resistant to beta-lactam antibiotics (except ceftaroline). Preferred therapy is vancomycin unless clinically contraindicated. Patient requires contact precautions if  hospitalized. CRITICAL RESULT CALLED TO, READ BACK BY AND VERIFIED WITH: L. Poindexter PharmD 16:30 06/01/18 ([email or web address])    Methicillin resistance DETECTED (A) NOT DETECTED Final    Comment: RESULT CALLED TO, READ BACK BY AND VERIFIED WITH: L. Poindexter PharmD 16:30 06/01/18 ([email or web address])    Streptococcus species NOT DETECTED NOT DETECTED Final   Streptococcus agalactiae NOT DETECTED NOT DETECTED Final   Streptococcus pneumoniae NOT DETECTED NOT DETECTED Final   Streptococcus pyogenes NOT DETECTED NOT DETECTED Final   Acinetobacter baumannii NOT DETECTED NOT DETECTED Final   Enterobacteriaceae species NOT DETECTED NOT DETECTED Final   Enterobacter cloacae complex NOT DETECTED NOT DETECTED Final   Escherichia coli NOT DETECTED NOT DETECTED Final   Klebsiella oxytoca NOT DETECTED NOT DETECTED Final   Klebsiella pneumoniae NOT DETECTED NOT DETECTED Final   Proteus species NOT DETECTED NOT DETECTED Final   Serratia marcescens NOT DETECTED NOT DETECTED Final   Haemophilus influenzae NOT DETECTED NOT DETECTED Final   Neisseria meningitidis NOT DETECTED NOT DETECTED Final    Pseudomonas aeruginosa NOT DETECTED NOT DETECTED Final   Candida albicans NOT DETECTED NOT DETECTED Final   Candida glabrata NOT DETECTED NOT DETECTED Final   Candida krusei NOT DETECTED NOT DETECTED Final   Candida parapsilosis NOT DETECTED NOT DETECTED Final   Candida tropicalis NOT DETECTED NOT DETECTED Final    Comment: Performed at [facility], [address] [place], [place] [address]  Culture, Urine     Status: Abnormal   Collection Time: 06/01/18  4:47 AM  Result Value Ref Range Status   Specimen Description   Final    URINE, CLEAN CATCH Performed at [facility], [address] [place], [place] [address]    Special Requests   Final    NONE Performed at [facility], [address] [place], [place] [address]    Culture MULTIPLE SPECIES PRESENT, SUGGEST RECOLLECTION (A)  Final   Report Status 06/02/2018 FINAL  Final  Culture, blood (routine x 2)     Status: None (Preliminary result)   Collection Time: 06/02/18  8:03 AM  Result Value Ref Range Status   Specimen Description   Final    BLOOD RIGHT HAND Performed at [facility], [address] [place], [place] [address]    Special Requests   Final    BOTTLES DRAWN AEROBIC ONLY Blood Culture results may not be optimal due to an inadequate volume of blood received in culture bottles Performed at [facility], [address] [place], [place] [address]    Culture   Final    NO GROWTH 4 DAYS Performed at [facility], [address] [place], [place] [address]    Report Status PENDING  Incomplete  Culture, blood (routine x 2)     Status:   None (Preliminary result)   Collection Time: 06/02/18  8:03 AM  Result Value Ref Range Status   Specimen Description   Final    BLOOD RIGHT HAND Performed at [facility], [address] [place], [place] [address]    Special Requests   Final    BOTTLES DRAWN AEROBIC ONLY Blood  Culture adequate volume [facility], [address] [place], [place] [address]    Culture   Final    NO GROWTH 4 DAYS Performed at [facility], [address] [place], [place] [address]    Report Status PENDING  Incomplete      Studies: No results found.  Scheduled Meds: . amLODipine  5 mg Oral Daily  . aspirin  325 mg Oral Daily  . atorvastatin  80 mg Oral q1800  . donepezil  5 mg Oral QHS  . enoxaparin (LOVENOX) injection  40 mg Subcutaneous Daily  . guaiFENesin  600 mg Oral BID  . insulin aspart  0-9 Units Subcutaneous TID WC  . insulin glargine  18 Units Subcutaneous Daily  . levETIRAcetam  500 mg Oral BID  . loratadine  10 mg Oral Daily  . magnesium oxide  400 mg Oral Daily  . metoprolol tartrate  25 mg Oral BID  . saccharomyces boulardii  250 mg Oral BID    Continuous Infusions: . sodium chloride    . vancomycin 1,250 mg (06/05/18 1637)     LOS: 5 days     Massiah Longanecker N Jaydenn Boccio, MD [facility] Pager [phone]  If 7PM-7AM, please contact night-coverage [email or web address] Password [email or web address] 06/06/2018, 5:15 PM   

## 2018-06-06 NOTE — Progress Notes (Signed)
Patient is scheduled for TEE on 2.11.20 @ 1500. Patient needs carelink tranpsort and to arrive @ [facility] @ 1330. Spoke with Aundra Millet, RN to set up carelink.

## 2018-06-06 NOTE — Care Management Important Message (Signed)
Important Message  Patient Details  Name: PAZ PEDROZA MRN: [id number] Date of Birth: [birth year]   Medicare Important Message Given:  Yes    Caren Macadam 06/06/2018, 12:02 PMImportant Message  Patient Details  Name: KEIGHLEY NITSCH MRN: [id number] Date of Birth: 04/23/1946   Medicare Important Message Given:  Yes    Caren Macadam 06/06/2018, 12:02 PM

## 2018-06-06 NOTE — Progress Notes (Signed)
    [facility] has been requested to perform a transesophageal echocardiogram on Paula Reed for bacteremia.  After careful review of history and examination, the risks and benefits of transesophageal echocardiogram have been explained including risks of esophageal damage, perforation (1:10,000 risk), bleeding, pharyngeal hematoma as well as other potential complications associated with conscious sedation including aspiration, arrhythmia, respiratory failure and death. Alternatives to treatment were discussed, questions were answered. Patient is willing to proceed.   She is scheduled for TEE tomorrow 06/07/18 at 1600 with Dr. Jens Som. NPO at [facility]. Please arrange [facility].  Roe Rutherford Adelyne Marchese, [place]  06/06/2018 11:15 AM

## 2018-06-06 NOTE — Progress Notes (Signed)
Physical Therapy Treatment Patient Details Name: Paula Reed MRN: [id number] DOB: 08-13-1945 Today's Date: 06/06/2018    History of Present Illness 73 yo female admitted to ED on 2/4 with falls x2 and fatigue. Pt with medical diagnoses of Influenza A and MRSA bacteremia. MRI 2/6 revealed acute ischemic stroke in the right pons-likely small vessel disease.  PMH includes DMII, HLD, HTN, seizures, alzheimer's dementia, heart murmur.     PT Comments    Pt assisted to EOB and required significant assist however with encouragement of spouse, pt able to better self assist to standing with RW.  Pt also able to take a few steps up HOB.  Pt fatigued quickly however and requested return to bed.  Continue to recommend SNF upon d/c.    Follow Up Recommendations  SNF;Supervision/Assistance - 24 hour     Equipment Recommendations  Rolling walker with 5" wheels    Recommendations for Other Services       Precautions / Restrictions Precautions Precautions: Fall    Mobility  Bed Mobility Overal bed mobility: Needs Assistance Bed Mobility: Supine to Sit;Sit to Supine Rolling: Max assist   Supine to sit: Max assist Sit to supine: Min guard   General bed mobility comments: assist for R LE and trunk upright; increased time and effort for returning to supine position however no physical assist required  Transfers Overall transfer level: Needs assistance Equipment used: Rolling walker (2 wheeled) Transfers: Sit to/from Stand Sit to Stand: Mod assist;From elevated surface         General transfer comment: verbal cues for UE placement and weight shifting, pt with LOB upon rise and assisted with controlling descent, performed twice more and pt required min assist to rise and steady; then also able to take a few side steps up [facility] however reported fatigue and wished to return to supine  Ambulation/Gait             General Gait Details: pt declined to due fatigue   Stairs              Wheelchair Mobility    Modified Rankin (Stroke Patients Only)       Balance                                            Cognition Arousal/Alertness: Awake/alert Behavior During Therapy: WFL for tasks assessed/performed Overall Cognitive Status: Within Functional Limits for tasks assessed                                 General Comments: pt appropriate during session today, assisting as able, following simple commands      Exercises      General Comments        Pertinent Vitals/Pain Pain Assessment: Faces Faces Pain Scale: Hurts even more Pain Location: R knee Pain Descriptors / Indicators: Aching;Tender;Sore Pain Intervention(s): Monitored during session;Repositioned;Limited activity within patient's tolerance    Home Living                      Prior Function            PT Goals (current goals can now be found in the care plan section) Progress towards PT goals: Progressing toward goals    Frequency    Min 2X/week  PT Plan Current plan remains appropriate    Co-evaluation              AM-PAC PT "6 Clicks" Mobility   Outcome Measure  Help needed turning from your back to your side while in a flat bed without using bedrails?: A Lot Help needed moving from lying on your back to sitting on the side of a flat bed without using bedrails?: A Lot Help needed moving to and from a bed to a chair (including a wheelchair)?: A Lot Help needed standing up from a chair using your arms (e.g., wheelchair or bedside chair)?: A Lot Help needed to walk in hospital room?: Total Help needed climbing 3-5 steps with a railing? : Total 6 Click Score: 10    End of Session Equipment Utilized During Treatment: Gait belt Activity Tolerance: Patient limited by fatigue Patient left: in bed;with call bell/phone within reach;with family/visitor present;with bed alarm set   PT Visit Diagnosis: Other abnormalities of  gait and mobility (R26.89);Muscle weakness (generalized) (M62.81)     Time: 3817-7116 PT Time Calculation (min) (ACUTE ONLY): 22 min  Charges:  $Gait Training: 8-22 mins                     Zenovia Jarred, PT, DPT Acute Rehabilitation Services Office: [phone] Pager: [phone]  [name] 06/06/2018, 4:13 PM

## 2018-06-06 NOTE — H&P (View-Only) (Signed)
PROGRESS NOTE  Paula Reed ZOX:[id number] DOB: 20-Feb-1946 DOA: 05/31/2018 PCP: Latrelle Dodrill, MD  HPI/Recap of past 24 hours: 72 y.o.female,with history of dementia, hyperlipidemia, hypertension, diabetes mellitus type 2, seizure disorder came to hospital after patient fell at home. In the ED patient was found to be febrile with a temp of 101.9. She had mildly abnormal UA and started on ceftriaxone and Zithromax for possible UTI and questionable pneumonia. Her O2 sats were 91% on room air.Influenza PCR came back positive for influenza A. Patient also found to have acute/subacute stroke.   06/05/2018: Patient seen and examined at bedside.  No acute events overnight.  Vital signs and lab studies are unremarkable.  Awaiting completion of TEE at [facility] possibly tomorrow.  06/06/2018: Patient seen and examined at bedside.  No acute events overnight.  She has no new complaints.  Awaiting PICC line placement and TEE at [facility] which will be done tomorrow.    Assessment/Plan: Principal Problem:   MRSA bacteremia Active Problems:   Diabetes mellitus, type II (HCC)   Hypertension   Hyperlipidemia   Probable Alzheimer's dementia without behavioral disturbance   Seizures (HCC)   Influenza A  MRSA sepsis/Influenza A- Presented with fever, influenza PCR positive for influenza A, patient empirically started on antibiotics to prevent superimposed infection but ID felt she has no evidence off pneumonia on chest x-ray and clinically. discontinued ceftriaxone and Zithromax.continueonTamiflu 75 mg p.o. twice daily.  blood culture positive for gram-positive cocci,  MRSA. Source unclear. 2-D echo without vegetations. ID recommends TEE, placed cardiology consult and notified fellow on call. Patient started on vancomycin IV. ID will determine duration of treatment. Repeat blood cultures from 2/ 6 no growth so far. Sepsis physiology is improving.  Reviewed vital signs and labs  which are stable.  PICC line will be placed today 06/06/2018.  TEE will be done tomorrow 06/07/2018 at [facility].  2. ? UTI-patient has mildly abnormal UA, denies any symptoms. urine culture nonspecific  3. Diabetes mellitus type 2-continue with Lantus 18 units subcu daily,Continuesliding scale insulin with NovoLog. hemoglobin A1c 8.9. Triglycerides 363, LDL not calculated, HDL less than 10  4. Dementia-no behavior disturbance, continue Aricept.  Stable.  5. Acute CVA MRI of the brain  Confirmed acute/subacute infarction . Neurology notified by NP last night, he recommended focused TIA order set. Increase aspirin to 325 mg a day. Started  patient on a statin Lipitor 80 mg daily. Telemetry showed normal sinus rhythm.discuss with Dr.Arora, he recommend CTA head and neck. He does not see any indication to transfer patient to [facility]. Patient  seen by PT OT speech . PT recommends SNF. Follow-up with outpatient neurology stroke clinic at [facility] neurology in 4 to 6 weeks after discharge  6. History of seizures-continue Keppra.  Stable.  7. Prolonged QTc interval-QTC is prolonged to 525, will start cardiac monitoring. Magnesium 1.8 . Magnesium oxide   8. Fall/ bruising-patient noted to have bruising on her bilateral knees and lips. She is unable to provide any history regarding falls.  Patient's husband is debilitated and unable to take care of her. She will need SNF   9. Abnormal troponin-suspect secondary to demand ischemia in the setting of influenza/acute bronchitis/sepsis/CVA . 2-D echo did not show any significant   wall motion abnormalities. 10. Physical debility/ambulatory dysfunction: PT recommend SNF.  CSW consulted for placement.    DVT prophylaxsis Lovenox  Code Status:  Full code    Family Communication: Discussed in detail  with the patient /son/husband, all imaging results, lab results explained to the patient   Disposition Plan: SNF Monday       Consultants:  Infectious disease  Procedures:  none      Objective: Vitals:   06/05/18 1356 06/05/18 2216 06/06/18 0458 06/06/18 1310  BP: (!) 164/82 (!) 161/68 (!) 157/72 (!) 147/68  Pulse: 77 89 63 66  Resp: 20 16 16 18   Temp: 98.4 F (36.9 C) 98.2 F (36.8 C) 98.1 F (36.7 C) 98.4 F (36.9 C)  TempSrc: Oral Oral Oral Oral  SpO2: 94% 95% 96% 96%  Weight:      Height:        Intake/Output Summary (Last 24 hours) at 06/06/2018 1715 Last data filed at 06/06/2018 1327 Gross per 24 hour  Intake 220 ml  Output 1500 ml  Net -1280 ml   Filed Weights   06/01/18 0215 06/01/18 0444  Weight: 100.7 kg 100.6 kg    Exam:  . General: 73 y.o. year-old female well-developed well-nourished in no acute distress.  Alert and interactive. . Cardiovascular: Regular rate and rhythm with no rubs or gallops.  No JVD or thyromegaly . Respiratory: Clear to auscultation with no wheezes or rales.  Poor inspiratory effort. . Abdomen: Soft nontender nondistended with normal bowel sounds x4 quadrants. . Psychiatry: Mood is appropriate for condition and setting   Data Reviewed: CBC: Recent Labs  Lab 05/31/18 2140 06/01/18 0911 06/03/18 0321 06/04/18 0804 06/05/18 0321 06/06/18 0719  WBC 13.0* 7.4 3.1* 4.5 5.0 5.0  NEUTROABS 11.2*  --   --   --   --   --   HGB 12.9 11.9* 11.4* 13.1 12.5 13.2  HCT 41.1 39.6 37.6 39.8 40.6 43.9  MCV 89.5 93.0 90.8 84.7 89.0 93.4  PLT 268 224 224 273 262 248   Basic Metabolic Panel: Recent Labs  Lab 06/01/18 0746  06/02/18 0347 06/03/18 0321 06/04/18 0804 06/05/18 0321 06/06/18 0719  NA  --    < > 135 136 136 137 135  K  --    < > 3.5 3.9 5.1 4.0 4.0  CL  --    < > 104 106 106 104 102  CO2  --    < > 24 25 21* 23 23  GLUCOSE  --    < > 139* 168* 153* 173* 156*  BUN  --    < > 9 10 8 10 10   CREATININE  --    < > 0.72 0.70 0.61 0.65 0.64  CALCIUM  --    < > 8.2* 8.4* 8.7* 9.1 9.0  MG 1.8  --   --   --  1.9  --   --    < > = values in  this interval not displayed.   GFR: Estimated Creatinine Clearance: 80 mL/min (by C-G formula based on SCr of 0.64 mg/dL). Liver Function Tests: Recent Labs  Lab 06/01/18 0911 06/03/18 0321 06/04/18 0804 06/05/18 0321 06/06/18 0719  AST 59* 42* 44* 36 35  ALT 23 21 22 19 21   ALKPHOS 73 60 60 58 62  BILITOT 0.5 0.4 0.7 0.7 0.7  PROT 7.0 6.5 7.2 7.0 7.4  ALBUMIN 3.4* 3.0* 3.3* 3.4* 3.4*   No results for input(s): LIPASE, AMYLASE in the last 168 hours. No results for input(s): AMMONIA in the last 168 hours. Coagulation Profile: No results for input(s): INR, PROTIME in the last 168 hours. Cardiac Enzymes: Recent Labs  Lab 05/31/18 2140 06/01/18 0746  06/01/18 1400  TROPONINI 0.03* 0.10* 0.06*   BNP (last 3 results) No results for input(s): PROBNP in the last 8760 hours. HbA1C: No results for input(s): HGBA1C in the last 72 hours. CBG: Recent Labs  Lab 06/05/18 1132 06/05/18 1644 06/05/18 2244 06/06/18 0840 06/06/18 1157  GLUCAP 207* 119* 198* 144* 134*   Lipid Profile: No results for input(s): CHOL, HDL, LDLCALC, TRIG, CHOLHDL, LDLDIRECT in the last 72 hours. Thyroid Function Tests: No results for input(s): TSH, T4TOTAL, FREET4, T3FREE, THYROIDAB in the last 72 hours. Anemia Panel: No results for input(s): VITAMINB12, FOLATE, FERRITIN, TIBC, IRON, RETICCTPCT in the last 72 hours. Urine analysis:    Component Value Date/Time   COLORURINE YELLOW 05/31/2018 2317   APPEARANCEUR HAZY (A) 05/31/2018 2317   LABSPEC 1.025 05/31/2018 2317   PHURINE 5.0 05/31/2018 2317   GLUCOSEU 50 (A) 05/31/2018 2317   HGBUR SMALL (A) 05/31/2018 2317   BILIRUBINUR NEGATIVE 05/31/2018 2317   BILIRUBINUR small 06/11/2016 1421   KETONESUR 5 (A) 05/31/2018 2317   PROTEINUR >=300 (A) 05/31/2018 2317   UROBILINOGEN >=8.0 06/11/2016 1421   NITRITE NEGATIVE 05/31/2018 2317   LEUKOCYTESUR NEGATIVE 05/31/2018 2317   Sepsis Labs: @LABRCNTIP (procalcitonin:4,lacticidven:4)  ) Recent  Results (from the past 240 hour(s))  Blood Culture (routine x 2)     Status: None   Collection Time: 05/31/18  9:41 PM  Result Value Ref Range Status   Specimen Description   Final    BLOOD RIGHT ANTECUBITAL Performed at [facility], [address] [place], [place] [address]    Special Requests   Final    BOTTLES DRAWN AEROBIC AND ANAEROBIC Blood Culture adequate volume Performed at [facility], [address] [place], [place] [address]    Culture   Final    NO GROWTH 5 DAYS Performed at [facility], [address] [place], [place] [address]    Report Status 06/05/2018 FINAL  Final  Blood Culture (routine x 2)     Status: Abnormal   Collection Time: 05/31/18  9:59 PM  Result Value Ref Range Status   Specimen Description   Final    BLOOD LEFT HAND Performed at [facility], [address] [place], [place] [address]    Special Requests   Final    BOTTLES DRAWN AEROBIC AND ANAEROBIC Blood Culture adequate volume Performed at [facility], [address] [place], [place] [address]    Culture  Setup Time   Final    GRAM POSITIVE COCCI IN CLUSTERS IN BOTH AEROBIC AND ANAEROBIC BOTTLES CRITICAL RESULT CALLED TO, READ BACK BY AND VERIFIED WITH: Jodi MarbleL. Poindexter PharmD 16:30 06/01/18 ([email or web address]) Performed at [facility], [address] [place], [place] [address]    Culture STAPHYLOCOCCUS AUREUS (A)  Final   Report Status 06/03/2018 FINAL  Final   Organism ID, Bacteria STAPHYLOCOCCUS AUREUS  Final      Susceptibility   Staphylococcus aureus - MIC*    CIPROFLOXACIN <=0.5 SENSITIVE Sensitive     ERYTHROMYCIN >=8 RESISTANT Resistant     GENTAMICIN <=0.5 SENSITIVE Sensitive     OXACILLIN <=0.25 SENSITIVE Sensitive     TETRACYCLINE <=1 SENSITIVE Sensitive     VANCOMYCIN 1 SENSITIVE Sensitive     TRIMETH/SULFA <=10 SENSITIVE Sensitive     CLINDAMYCIN <=0.25 SENSITIVE Sensitive     RIFAMPIN <=0.5  SENSITIVE Sensitive     Inducible Clindamycin NEGATIVE Sensitive     * STAPHYLOCOCCUS AUREUS  Blood Culture ID Panel (Reflexed)     Status: Abnormal   Collection Time: 05/31/18  9:59 PM  Result Value Ref Range Status   Enterococcus species NOT DETECTED NOT DETECTED Final   Listeria monocytogenes NOT DETECTED NOT DETECTED Final   Staphylococcus species DETECTED (A) NOT DETECTED Final    Comment: CRITICAL RESULT CALLED TO, READ BACK BY AND VERIFIED WITH: Jodi Marble PharmD 16:30 06/01/18 ([email or web address])    Staphylococcus aureus (BCID) DETECTED (A) NOT DETECTED Final    Comment: Methicillin (oxacillin)-resistant Staphylococcus aureus (MRSA). MRSA is predictably resistant to beta-lactam antibiotics (except ceftaroline). Preferred therapy is vancomycin unless clinically contraindicated. Patient requires contact precautions if  hospitalized. CRITICAL RESULT CALLED TO, READ BACK BY AND VERIFIED WITH: Jodi Marble PharmD 16:30 06/01/18 ([email or web address])    Methicillin resistance DETECTED (A) NOT DETECTED Final    Comment: RESULT CALLED TO, READ BACK BY AND VERIFIED WITH: Jodi Marble PharmD 16:30 06/01/18 ([email or web address])    Streptococcus species NOT DETECTED NOT DETECTED Final   Streptococcus agalactiae NOT DETECTED NOT DETECTED Final   Streptococcus pneumoniae NOT DETECTED NOT DETECTED Final   Streptococcus pyogenes NOT DETECTED NOT DETECTED Final   Acinetobacter baumannii NOT DETECTED NOT DETECTED Final   Enterobacteriaceae species NOT DETECTED NOT DETECTED Final   Enterobacter cloacae complex NOT DETECTED NOT DETECTED Final   Escherichia coli NOT DETECTED NOT DETECTED Final   Klebsiella oxytoca NOT DETECTED NOT DETECTED Final   Klebsiella pneumoniae NOT DETECTED NOT DETECTED Final   Proteus species NOT DETECTED NOT DETECTED Final   Serratia marcescens NOT DETECTED NOT DETECTED Final   Haemophilus influenzae NOT DETECTED NOT DETECTED Final   Neisseria meningitidis NOT DETECTED NOT DETECTED Final    Pseudomonas aeruginosa NOT DETECTED NOT DETECTED Final   Candida albicans NOT DETECTED NOT DETECTED Final   Candida glabrata NOT DETECTED NOT DETECTED Final   Candida krusei NOT DETECTED NOT DETECTED Final   Candida parapsilosis NOT DETECTED NOT DETECTED Final   Candida tropicalis NOT DETECTED NOT DETECTED Final    Comment: Performed at [facility], [address], [place], [place] [address]  Culture, Urine     Status: Abnormal   Collection Time: 06/01/18  4:47 AM  Result Value Ref Range Status   Specimen Description   Final    URINE, CLEAN CATCH Performed at [facility], [address], [place], [place] [address]    Special Requests   Final    NONE Performed at [facility], [address] [place], [place] [address]    Culture MULTIPLE SPECIES PRESENT, SUGGEST RECOLLECTION (A)  Final   Report Status 06/02/2018 FINAL  Final  Culture, blood (routine x 2)     Status: None (Preliminary result)   Collection Time: 06/02/18  8:03 AM  Result Value Ref Range Status   Specimen Description   Final    BLOOD RIGHT HAND Performed at [facility], [address], [place], [place] [address]    Special Requests   Final    BOTTLES DRAWN AEROBIC ONLY Blood Culture results may not be optimal due to an inadequate volume of blood received in culture bottles Performed at [facility], [address] [place], [place] [address]    Culture   Final    NO GROWTH 4 DAYS Performed at [facility], [address], [place], [place] [address]    Report Status PENDING  Incomplete  Culture, blood (routine x 2)     Status:  None (Preliminary result)   Collection Time: 06/02/18  8:03 AM  Result Value Ref Range Status   Specimen Description   Final    BLOOD RIGHT HAND Performed at [facility], [address], [place], [place] [address]    Special Requests   Final    BOTTLES DRAWN AEROBIC ONLY Blood  Culture adequate volume Performed at [facility], [address], [place], [place] [address]    Culture   Final    NO GROWTH 4 DAYS Performed at [facility], [address], [place], [place] [address]    Report Status PENDING  Incomplete      Studies: No results found.  Scheduled Meds: . amLODipine  5 mg Oral Daily  . aspirin  325 mg Oral Daily  . atorvastatin  80 mg Oral q1800  . donepezil  5 mg Oral QHS  . enoxaparin (LOVENOX) injection  40 mg Subcutaneous Daily  . guaiFENesin  600 mg Oral BID  . insulin aspart  0-9 Units Subcutaneous TID WC  . insulin glargine  18 Units Subcutaneous Daily  . levETIRAcetam  500 mg Oral BID  . loratadine  10 mg Oral Daily  . magnesium oxide  400 mg Oral Daily  . metoprolol tartrate  25 mg Oral BID  . saccharomyces boulardii  250 mg Oral BID    Continuous Infusions: . sodium chloride    . vancomycin 1,250 mg (06/05/18 1637)     LOS: 5 days     Darlin Drop, MD [facility] Pager [phone]  If 7PM-7AM, please contact night-coverage [email or web address] Password [email or web address] 06/06/2018, 5:15 PM

## 2018-06-06 NOTE — Progress Notes (Signed)
Peripherally Inserted Central Catheter/Midline Placement  The IV Nurse has discussed with the patient and/or persons authorized to consent for the patient, the purpose of this procedure and the potential benefits and risks involved with this procedure.  The benefits include less needle sticks, lab draws from the catheter, and the patient may be discharged home with the catheter. Risks include, but not limited to, infection, bleeding, blood clot (thrombus formation), and puncture of an artery; nerve damage and irregular heartbeat and possibility to perform a PICC exchange if needed/ordered by physician.  Alternatives to this procedure were also discussed.  Bard Power PICC patient education guide, fact sheet on infection prevention and patient information card has been provided to patient /or left at bedside.    PICC/Midline Placement Documentation  PICC Single Lumen 06/06/18 PICC Right Brachial 44 cm 0 cm (Active)  Indication for Insertion or Continuance of Line Prolonged intravenous therapies 06/06/2018  4:52 PM  Exposed Catheter (cm) 1 cm 06/06/2018  4:52 PM  Site Assessment Dry;Clean;Intact 06/06/2018  4:52 PM  Line Status Flushed;Blood return noted 06/06/2018  4:52 PM  Dressing Type Transparent 06/06/2018  4:52 PM  Dressing Status Clean;Dry;Intact;Antimicrobial disc in place 06/06/2018  4:52 PM  Dressing Intervention New dressing 06/06/2018  4:52 PM  Dressing Change Due 06/13/18 06/06/2018  4:52 PM   Telephone consent signed by husband    Paula Reed 06/06/2018, 4:53 PM

## 2018-06-07 ENCOUNTER — Encounter (HOSPITAL_COMMUNITY)
Admission: EM | Disposition: A | Discharge status: Skilled Nursing Facility | Payer: Self-pay | Source: Home / Self Care | Attending: Internal Medicine

## 2018-06-07 ENCOUNTER — Inpatient Hospital Stay (HOSPITAL_COMMUNITY): Payer: Medicare Other | Admitting: Certified Registered Nurse Anesthetist

## 2018-06-07 ENCOUNTER — Inpatient Hospital Stay (HOSPITAL_COMMUNITY): Payer: Medicare Other

## 2018-06-07 ENCOUNTER — Encounter (HOSPITAL_COMMUNITY): Payer: Self-pay | Admitting: Certified Registered Nurse Anesthetist

## 2018-06-07 DIAGNOSIS — R7881 Bacteremia: Secondary | ICD-10-CM

## 2018-06-07 HISTORY — PX: TEE WITHOUT CARDIOVERSION: SHX5443

## 2018-06-07 LAB — CULTURE, BLOOD (ROUTINE X 2)
Culture: NO GROWTH
Culture: NO GROWTH
Special Requests: ADEQUATE

## 2018-06-07 LAB — CBC
HCT: 40.2 % (ref 36.0–46.0)
Hemoglobin: 12.5 g/dL (ref 12.0–15.0)
MCH: 28.3 pg (ref 26.0–34.0)
MCHC: 31.1 g/dL (ref 30.0–36.0)
MCV: 91.2 fL (ref 80.0–100.0)
Platelets: 285 10*3/uL (ref 150–400)
RBC: 4.41 MIL/uL (ref 3.87–5.11)
RDW: 13.4 % (ref 11.5–15.5)
WBC: 6.8 10*3/uL (ref 4.0–10.5)
nRBC: 0 % (ref 0.0–0.2)

## 2018-06-07 LAB — COMPREHENSIVE METABOLIC PANEL
ALT: 19 U/L (ref 0–44)
AST: 33 U/L (ref 15–41)
Albumin: 3.4 g/dL — ABNORMAL LOW (ref 3.5–5.0)
Alkaline Phosphatase: 67 U/L (ref 38–126)
Anion gap: 9 (ref 5–15)
BUN: 13 mg/dL (ref 8–23)
CO2: 24 mmol/L (ref 22–32)
Calcium: 8.6 mg/dL — ABNORMAL LOW (ref 8.9–10.3)
Chloride: 100 mmol/L (ref 98–111)
Creatinine, Ser: 0.59 mg/dL (ref 0.44–1.00)
GFR calc Af Amer: >60 mL/min (ref >60)
GFR calc non Af Amer: >60 mL/min (ref >60)
Glucose, Bld: 163 mg/dL — ABNORMAL HIGH (ref 70–99)
Potassium: 3.9 mmol/L (ref 3.5–5.1)
Sodium: 133 mmol/L — ABNORMAL LOW (ref 135–145)
Total Bilirubin: 0.8 mg/dL (ref 0.3–1.2)
Total Protein: 7.1 g/dL (ref 6.5–8.1)

## 2018-06-07 LAB — GLUCOSE, CAPILLARY
Glucose-Capillary: 162 mg/dL — ABNORMAL HIGH (ref 70–99)
Glucose-Capillary: 126 mg/dL — ABNORMAL HIGH (ref 70–99)
Glucose-Capillary: 120 mg/dL — ABNORMAL HIGH (ref 70–99)
Glucose-Capillary: 111 mg/dL — ABNORMAL HIGH (ref 70–99)
Glucose-Capillary: 124 mg/dL — ABNORMAL HIGH (ref 70–99)
Glucose-Capillary: 133 mg/dL — ABNORMAL HIGH (ref 70–99)

## 2018-06-07 LAB — VANCOMYCIN, TROUGH: Vancomycin Tr: 7 ug/mL — ABNORMAL LOW (ref 15–20)

## 2018-06-07 SURGERY — ECHOCARDIOGRAM, TRANSESOPHAGEAL
Anesthesia: Monitor Anesthesia Care

## 2018-06-07 MED ORDER — LIDOCAINE 2% (20 MG/ML) 5 ML SYRINGE
INTRAMUSCULAR | Status: DC | PRN
  Administered 2018-06-07: 80 mg via INTRAVENOUS

## 2018-06-07 MED ORDER — VANCOMYCIN HCL IN DEXTROSE 750-5 MG/150ML-% IV SOLN
750.0000 mg | Freq: Two times a day (BID) | INTRAVENOUS | Status: DC
  Administered 2018-06-08: 750 mg via INTRAVENOUS
  Filled 2018-06-07 (×2): qty 150

## 2018-06-07 MED ORDER — PROPOFOL 500 MG/50ML IV EMUL
INTRAVENOUS | Status: DC | PRN
  Administered 2018-06-07: 80 ug/kg/min via INTRAVENOUS

## 2018-06-07 MED ORDER — PHENYLEPHRINE 40 MCG/ML (10ML) SYRINGE FOR IV PUSH (FOR BLOOD PRESSURE SUPPORT)
PREFILLED_SYRINGE | INTRAVENOUS | Status: DC | PRN
  Administered 2018-06-07: 120 ug via INTRAVENOUS

## 2018-06-07 MED ORDER — AMLODIPINE BESYLATE 10 MG PO TABS
10.0000 mg | ORAL_TABLET | Freq: Every day | ORAL | Status: DC
  Administered 2018-06-07 – 2018-06-08 (×2): 10 mg via ORAL
  Filled 2018-06-07 (×2): qty 1

## 2018-06-07 MED ORDER — SODIUM CHLORIDE 0.9 % IV SOLN
INTRAVENOUS | Status: DC
  Administered 2018-06-07: 14:00:00 via INTRAVENOUS

## 2018-06-07 MED ORDER — SODIUM CHLORIDE (PF) 0.9 % IJ SOLN
INTRAMUSCULAR | Status: DC | PRN
  Administered 2018-06-07: 9 mL via INTRAVENOUS

## 2018-06-07 MED ORDER — PROPOFOL 10 MG/ML IV BOLUS
INTRAVENOUS | Status: DC | PRN
  Administered 2018-06-07: 30 mg via INTRAVENOUS

## 2018-06-07 NOTE — Progress Notes (Addendum)
    Transesophageal Echocardiogram Note  Paula Reed [id number] Sep 24, 1945  Procedure: Transesophageal Echocardiogram Indications: Bacteremia   Procedure Details Consent: Obtained Time Out: Verified patient identification, verified procedure, site/side was marked, verified correct patient position, special equipment/implants available, Radiology Safety Procedures followed,  medications/allergies/relevent history reviewed, required imaging and test results available.  Performed  Medications:  Pt sedated by anesthesia with lidocaine 80 mg and diprovan 230 mg IV.  Normal LV function; small oscillating density on MV; cannot R/O vegetation; trace MR; positive saline microcavitation study suggestive of PFO. Pt likely needs full 6 weeks of antibiotics and repeat TEE afterwards.   Complications: No apparent complications Patient did tolerate procedure well.  Olga Millers, MD

## 2018-06-07 NOTE — Progress Notes (Signed)
Pharmacy Antibiotic Note  Paula Reed is a 73 y.o. female presented to the ED on 05/31/2018 s/p fall and c/o weakness.  She was started on ceftriaxone and azithromycin for suspected UTI and PNA. Two of four bcx bottles from 2/4 came back with GPC in clusters with BCID positive for staph species and staph aureus (MR resistance detected).  Started vancomycin for bacteremia.  Today, 06/07/2018, Day #7 vancomycin Afebrile WBC WNL SCr 0.59, stable TEE: cannot rule out vegetation ID recommending 6 weeks of Vancomycin therapy   Plan: - Adjust Vancomycin to 750mg  IV q12h based on peak/trough results (new regimen will give estimated AUC 477) - Monitor renal function, cultures, clinical course. - Plan to re-check levels at new steady state.   ____________________________________  Height: 5\' 8"  (172.7 cm) Weight: 221 lb 12.5 oz (100.6 kg) IBW/kg (Calculated) : 63.9  Temp (24hrs), Avg:98.2 F (36.8 C), Min:98 F (36.7 C), Max:98.4 F (36.9 C)  Recent Labs  Lab 05/31/18 2140 05/31/18 2328  06/03/18 0321 06/04/18 0804 06/05/18 0321 06/05/18 1934 06/06/18 0719 06/07/18 0322 06/07/18 1731  WBC 13.0*  --    < > 3.1* 4.5 5.0  --  5.0 6.8  --   CREATININE 1.17*  --    < > 0.70 0.61 0.65  --  0.64 0.59  --   LATICACIDVEN 2.1* 1.8  --   --   --   --   --   --   --   --   VANCOTROUGH  --   --   --   --   --   --   --   --   --  7*  VANCOPEAK  --   --   --   --   --   --  28*  --   --   --    < > = values in this interval not displayed.    Estimated Creatinine Clearance: 80 mL/min (by C-G formula based on SCr of 0.59 mg/dL).    Allergies  Allergen Reactions  . Chlorthalidone Rash    Per [facility] records, pt may have had a rash rxn to chlorthalidone   Antimicrobials/Antivirals this admission:  2/4 Azithromycin >> 2/5 2/4 CTX >> 2/5 2/5 Vancomycin >> 2/5 Tamiflu >> 2/9  Microbiology results:  2/4 BCx: 2/4 bottles MSSA but BCID detected methicillin resistance 2/5 influenza A  positive 2/6 UCx: multiple species 2/6 repeat BCx: NGF   Thank you for allowing pharmacy to be a part of this patient's care.   Greer Pickerel, PharmD, BCPS Pager: [phone] 06/07/2018 7:28 PM

## 2018-06-07 NOTE — Interval H&P Note (Signed)
History and Physical Interval Note:  06/07/2018 1:25 PM  Paula Reed  has presented today for surgery, with the diagnosis of BACTERMIA  The various methods of treatment have been discussed with the patient and family. After consideration of risks, benefits and other options for treatment, the patient has consented to  Procedure(s): TRANSESOPHAGEAL ECHOCARDIOGRAM (TEE) (N/A) as a surgical intervention .  The patient's history has been reviewed, patient examined, no change in status, stable for surgery.  I have reviewed the patient's chart and labs.  Questions were answered to the patient's satisfaction.     Olga Millers

## 2018-06-07 NOTE — Progress Notes (Signed)
OT Cancellation Note  Patient Details Name: Paula Reed MRN: [id number] DOB: August 21, 1945   Cancelled Treatment:    Reason Eval/Treat Not Completed: Patient at procedure or test/ unavailable  Cella Cappello 06/07/2018, 2:37 PM  Marica Otter, OTR/L Acute Rehabilitation Services [phone] [facility] pager [phone] office 06/07/2018

## 2018-06-07 NOTE — Progress Notes (Signed)
PHARMACY CONSULT NOTE FOR:  OUTPATIENT  PARENTERAL ANTIBIOTIC THERAPY (OPAT)  Indication: Staph aureus bacteremia  Regimen: Vancomycin 750mg  IV q12h End date: 07/12/2018  IV antibiotic discharge orders are pended. To discharging provider:  please sign these orders via discharge navigator,  Select New Orders & click on the button choice - Manage This Unsigned Work.     Thank you for allowing pharmacy to be a part of this patient's care.  Jamse Mead 06/07/2018, 9:26 PM

## 2018-06-07 NOTE — Anesthesia Postprocedure Evaluation (Signed)
Anesthesia Post Note  Patient: Paula Reed  Procedure(s) Performed: TRANSESOPHAGEAL ECHOCARDIOGRAM (TEE) (N/A ) BUBBLE STUDY     Patient location during evaluation: PACU Anesthesia Type: MAC Level of consciousness: awake and alert Pain management: pain level controlled Vital Signs Assessment: post-procedure vital signs reviewed and stable Respiratory status: spontaneous breathing, nonlabored ventilation, respiratory function stable and patient connected to nasal cannula oxygen Cardiovascular status: stable and blood pressure returned to baseline Postop Assessment: no apparent nausea or vomiting Anesthetic complications: no    Last Vitals:  Vitals:   06/07/18 1440 06/07/18 1450  BP: 112/62 (!) 131/58  Pulse: 70 65  Resp: (!) 30 (!) 22  Temp: 36.9 C   SpO2: 98% 100%    Last Pain:  Vitals:   06/07/18 1450  TempSrc:   PainSc: 0-No pain                 Kariyah Baugh

## 2018-06-07 NOTE — Transfer of Care (Signed)
Immediate Anesthesia Transfer of Care Note  Patient: Paula Reed  Procedure(s) Performed: TRANSESOPHAGEAL ECHOCARDIOGRAM (TEE) (N/A ) BUBBLE STUDY  Patient Location: Endoscopy Unit  Anesthesia Type:MAC  Level of Consciousness: drowsy  Airway & Oxygen Therapy: Patient Spontanous Breathing  Post-op Assessment: Report given to RN, Post -op Vital signs reviewed and stable and Patient moving all extremities X 4  Post vital signs: Reviewed and stable  Last Vitals:  Vitals Value Taken Time  BP    Temp    Pulse 64 06/07/2018  2:34 PM  Resp 22 06/07/2018  2:34 PM  SpO2 98 % 06/07/2018  2:34 PM  Vitals shown include unvalidated device data.  Last Pain:  Vitals:   06/07/18 1325  TempSrc: Oral  PainSc: 0-No pain      Patients Stated Pain Goal: 2 (98/33/82 5053)  Complications: No apparent anesthesia complications

## 2018-06-07 NOTE — Anesthesia Preprocedure Evaluation (Addendum)
Anesthesia Evaluation  Patient identified by MRN, date of birth, ID band Patient awake    Reviewed: Allergy & Precautions, H&P , NPO status , Patient's Chart, lab work & pertinent test results, reviewed documented beta blocker date and time   Airway Mallampati: II  TM Distance: >3 FB Neck ROM: full    Dental no notable dental hx. (+) Edentulous Upper, Edentulous Lower, Upper Dentures, Lower Dentures   Pulmonary neg pulmonary ROS, former smoker,    Pulmonary exam normal breath sounds clear to auscultation       Cardiovascular Exercise Tolerance: Good hypertension, Pt. on medications negative cardio ROS Normal cardiovascular exam Rhythm:regular Rate:Normal  ECHO 2/20 IMPRESSIONS    1. The left ventricle has normal systolic function of 82-64%. The cavity size was normal. There is mildly increased left ventricular wall thickness. Echo evidence of impaired diastolic relaxation.  2. The right ventricle has normal systolic function. The cavity was normal. There is no increase in right ventricular wall thickness.   Neuro/Psych Seizures -, Well Controlled,  PSYCHIATRIC DISORDERS Dementia    GI/Hepatic negative GI ROS, Neg liver ROS,   Endo/Other  negative endocrine ROSdiabetes, Type 2  Renal/GU negative Renal ROS  negative genitourinary   Musculoskeletal   Abdominal   Peds  Hematology negative hematology ROS (+)   Anesthesia Other Findings   Reproductive/Obstetrics negative OB ROS (+) Pregnancy                            Anesthesia Physical Anesthesia Plan  ASA: III  Anesthesia Plan: MAC   Post-op Pain Management:    Induction: Intravenous  PONV Risk Score and Plan:   Airway Management Planned: Mask and Natural Airway  Additional Equipment:   Intra-op Plan:   Post-operative Plan:   Informed Consent: I have reviewed the patients History and Physical, chart, labs and discussed the  procedure including the risks, benefits and alternatives for the proposed anesthesia with the patient or authorized representative who has indicated his/her understanding and acceptance.     Dental Advisory Given  Plan Discussed with: CRNA, Anesthesiologist and Surgeon  Anesthesia Plan Comments:       Anesthesia Quick Evaluation

## 2018-06-07 NOTE — Progress Notes (Signed)
PROGRESS NOTE  Paula Reed:[id number] DOB: 26-Jun-1945 DOA: 05/31/2018 PCP: Latrelle Dodrill, MD  HPI/Recap of past 24 hours: 73 y.o.female,with history of dementia, hyperlipidemia, hypertension, diabetes mellitus type 2, seizure disorder came to hospital after patient fell at home. In the ED patient was found to be febrile with a temp of 101.9. She had mildly abnormal UA and started on ceftriaxone and Zithromax for possible UTI and questionable pneumonia. Her O2 sats were 91% on room air.Influenza PCR came back positive for influenza A. Patient also found to have acute/subacute stroke.   06/05/2018: Patient seen and examined at bedside.  No acute events overnight.  Vital signs and lab studies are unremarkable.  Awaiting completion of TEE at [name] possibly tomorrow.  06/06/2018: Patient seen and examined at bedside.  No acute events overnight.  She has no new complaints.  Awaiting PICC line placement and TEE at [facility] which will be done tomorrow.  06/07/18: TEE done could not r/o vegetation. Suspected PFO. Will complete 6 weeks IV vancomycin as recommended by ID. End date 07/12/18.    Assessment/Plan: Principal Problem:   MRSA bacteremia Active Problems:   Diabetes mellitus, type II (HCC)   Hypertension   Hyperlipidemia   Probable Alzheimer's dementia without behavioral disturbance   Seizures (HCC)   Influenza A  MRSA sepsis/Influenza A- MRSA bacteremia Presented with fever, influenza PCR positive for influenza A, patient empirically started on antibiotics to prevent superimposed infection but ID felt she has no evidence off pneumonia on chest x-ray and clinically. discontinued ceftriaxone and Zithromax.continueonTamiflu 75 mg p.o. twice daily.  blood culture positive for gram-positive cocci,  MRSA. Source unclear. 2-D echo without vegetations. ID recommends TEE, placed cardiology consult and notified fellow on call. Patient started on vancomycin IV. ID  will determine duration of treatment. Repeat blood cultures from 2/ 6 no growth so far. Sepsis physiology is improving.  Reviewed vital signs and labs which are stable.  PICC line will be placed today 06/06/2018.  TEE done 06/07/18. TEE done could not r/o vegetation. Suspected PFO. Will complete 6 weeks IV vancomycin as recommended by ID. End date 07/12/18.  2. ? UTI-patient has mildly abnormal UA, denies any symptoms. urine culture nonspecific  3. Diabetes mellitus type 2-continue with Lantus 18 units subcu daily,Continuesliding scale insulin with NovoLog. hemoglobin A1c 8.9. Triglycerides 363, LDL not calculated, HDL less than 10  4. Dementia-no behavior disturbance, continue Aricept.  Stable.  5. Acute CVA MRI of the brain  Confirmed acute/subacute infarction . Neurology notified by NP last night, he recommended focused TIA order set. Increase aspirin to 325 mg a day. Started  patient on a statin Lipitor 80 mg daily. Telemetry showed normal sinus rhythm.discuss with Dr.Arora, he recommend CTA head and neck. He does not see any indication to transfer patient to [facility]. Patient  seen by PT OT speech . PT recommends SNF. Follow-up with outpatient neurology stroke clinic at [facility] neurology in 4 to 6 weeks after discharge  6. History of seizures-continue Keppra.  Stable.  7. Prolonged QTc interval-QTC is prolonged to 525, will start cardiac monitoring. Magnesium 1.8 . Magnesium oxide   8. Fall/ bruising-patient noted to have bruising on her bilateral knees and lips. She is unable to provide any history regarding falls.  Patient's husband is debilitated and unable to take care of her. She will need SNF   9. Abnormal troponin-suspect secondary to demand ischemia in the setting of influenza/acute bronchitis/sepsis/CVA . 2-D echo did not show  any significant   wall motion abnormalities. 10. Physical debility/ambulatory dysfunction: PT recommend SNF.  CSW consulted for  placement.    DVT prophylaxsis Lovenox  Code Status:  Full code    Family Communication: Discussed in detail with the patient /son/husband, all imaging results, lab results explained to the patient   Disposition Plan: Awaiting SNF placement possible dc tomorrow 06/08/18      Consultants:  Infectious disease  Procedures:  none      Objective: Vitals:   06/07/18 1450 06/07/18 1500 06/07/18 1510 06/07/18 1600  BP: (!) 131/58 131/69 (!) 147/59 (!) 142/70  Pulse: 65 66 63 63  Resp: (!) 22 15 (!) 23 19  Temp:    98 F (36.7 C)  TempSrc:    Oral  SpO2: 100% 100% 100% 96%  Weight:      Height:        Intake/Output Summary (Last 24 hours) at 06/07/2018 1705 Last data filed at 06/07/2018 1427 Gross per 24 hour  Intake 400 ml  Output 300 ml  Net 100 ml   Filed Weights   06/01/18 0215 06/01/18 0444 06/07/18 1325  Weight: 100.7 kg 100.6 kg 100.6 kg    Exam:  . General: 73 y.o. year-old female well-nourished in no acute distress.  Alert and interactive. . Cardiovascular: Regular rate and rhythm with no rubs or gallops.  No JVD or thyromegaly . Respiratory: Clear to auscultation with no wheezes or rales.  Poor inspiratory effort. . Abdomen: Soft nontender nondistended with normal bowel sounds x4 quadrants. . Psychiatry: Mood is appropriate for condition and setting   Data Reviewed: CBC: Recent Labs  Lab 05/31/18 2140  06/03/18 0321 06/04/18 0804 06/05/18 0321 06/06/18 0719 06/07/18 0322  WBC 13.0*   < > 3.1* 4.5 5.0 5.0 6.8  NEUTROABS 11.2*  --   --   --   --   --   --   HGB 12.9   < > 11.4* 13.1 12.5 13.2 12.5  HCT 41.1   < > 37.6 39.8 40.6 43.9 40.2  MCV 89.5   < > 90.8 84.7 89.0 93.4 91.2  PLT 268   < > 224 273 262 248 285   < > = values in this interval not displayed.   Basic Metabolic Panel: Recent Labs  Lab 06/01/18 0746  06/03/18 0321 06/04/18 0804 06/05/18 0321 06/06/18 0719 06/07/18 0322  NA  --    < > 136 136 137 135 133*  K   --    < > 3.9 5.1 4.0 4.0 3.9  CL  --    < > 106 106 104 102 100  CO2  --    < > 25 21* 23 23 24   GLUCOSE  --    < > 168* 153* 173* 156* 163*  BUN  --    < > 10 8 10 10 13   CREATININE  --    < > 0.70 0.61 0.65 0.64 0.59  CALCIUM  --    < > 8.4* 8.7* 9.1 9.0 8.6*  MG 1.8  --   --  1.9  --   --   --    < > = values in this interval not displayed.   GFR: Estimated Creatinine Clearance: 80 mL/min (by C-G formula based on SCr of 0.59 mg/dL). Liver Function Tests: Recent Labs  Lab 06/03/18 0321 06/04/18 0804 06/05/18 0321 06/06/18 0719 06/07/18 0322  AST 42* 44* 36 35 33  ALT 21 22 19 21  19  ALKPHOS 60 60 58 62 67  BILITOT 0.4 0.7 0.7 0.7 0.8  PROT 6.5 7.2 7.0 7.4 7.1  ALBUMIN 3.0* 3.3* 3.4* 3.4* 3.4*   No results for input(s): LIPASE, AMYLASE in the last 168 hours. No results for input(s): AMMONIA in the last 168 hours. Coagulation Profile: No results for input(s): INR, PROTIME in the last 168 hours. Cardiac Enzymes: Recent Labs  Lab 05/31/18 2140 06/01/18 0746 06/01/18 1400  TROPONINI 0.03* 0.10* 0.06*   BNP (last 3 results) No results for input(s): PROBNP in the last 8760 hours. HbA1C: No results for input(s): HGBA1C in the last 72 hours. CBG: Recent Labs  Lab 06/06/18 2114 06/07/18 0756 06/07/18 1243 06/07/18 1523 06/07/18 1643  GLUCAP 162* 126* 120* 111* 124*   Lipid Profile: No results for input(s): CHOL, HDL, LDLCALC, TRIG, CHOLHDL, LDLDIRECT in the last 72 hours. Thyroid Function Tests: No results for input(s): TSH, T4TOTAL, FREET4, T3FREE, THYROIDAB in the last 72 hours. Anemia Panel: No results for input(s): VITAMINB12, FOLATE, FERRITIN, TIBC, IRON, RETICCTPCT in the last 72 hours. Urine analysis:    Component Value Date/Time   COLORURINE YELLOW 05/31/2018 2317   APPEARANCEUR HAZY (A) 05/31/2018 2317   LABSPEC 1.025 05/31/2018 2317   PHURINE 5.0 05/31/2018 2317   GLUCOSEU 50 (A) 05/31/2018 2317   HGBUR SMALL (A) 05/31/2018 2317   BILIRUBINUR  NEGATIVE 05/31/2018 2317   BILIRUBINUR small 06/11/2016 1421   KETONESUR 5 (A) 05/31/2018 2317   PROTEINUR >=300 (A) 05/31/2018 2317   UROBILINOGEN >=8.0 06/11/2016 1421   NITRITE NEGATIVE 05/31/2018 2317   LEUKOCYTESUR NEGATIVE 05/31/2018 2317   Sepsis Labs: @LABRCNTIP (procalcitonin:4,lacticidven:4)  ) Recent Results (from the past 240 hour(s))  Blood Culture (routine x 2)     Status: None   Collection Time: 05/31/18  9:41 PM  Result Value Ref Range Status   Specimen Description   Final    BLOOD RIGHT ANTECUBITAL Performed at [facility], [address] [place], [place] [address]    Special Requests   Final    BOTTLES DRAWN AEROBIC AND ANAEROBIC Blood Culture adequate volume Performed at [facility], [address] [place], [place] [address]    Culture   Final    NO GROWTH 5 DAYS Performed at [facility], [address], [place], [place] [address]    Report Status 06/05/2018 FINAL  Final  Blood Culture (routine x 2)     Status: Abnormal   Collection Time: 05/31/18  9:59 PM  Result Value Ref Range Status   Specimen Description   Final    BLOOD LEFT HAND Performed at [facility], [address], [place] , [place] [address]    Special Requests   Final    BOTTLES DRAWN AEROBIC AND ANAEROBIC Blood Culture adequate volume Performed at [facility], [address] [place], [place] [address]    Culture  Setup Time   Final    GRAM POSITIVE COCCI IN CLUSTERS IN BOTH AEROBIC AND ANAEROBIC BOTTLES CRITICAL RESULT CALLED TO, READ BACK BY AND VERIFIED WITH: Jodi Marble PharmD 16:30 06/01/18 ([email or web address]) Performed at [facility], [address] [place], [place] [address]    Culture STAPHYLOCOCCUS AUREUS (A)  Final   Report Status 06/03/2018 FINAL  Final   Organism ID, Bacteria STAPHYLOCOCCUS AUREUS  Final      Susceptibility   Staphylococcus aureus - MIC*    CIPROFLOXACIN <=0.5 SENSITIVE  Sensitive     ERYTHROMYCIN >=8 RESISTANT  Resistant     GENTAMICIN <=0.5 SENSITIVE Sensitive     OXACILLIN <=0.25 SENSITIVE Sensitive     TETRACYCLINE <=1 SENSITIVE Sensitive     VANCOMYCIN 1 SENSITIVE Sensitive     TRIMETH/SULFA <=10 SENSITIVE Sensitive     CLINDAMYCIN <=0.25 SENSITIVE Sensitive     RIFAMPIN <=0.5 SENSITIVE Sensitive     Inducible Clindamycin NEGATIVE Sensitive     * STAPHYLOCOCCUS AUREUS  Blood Culture ID Panel (Reflexed)     Status: Abnormal   Collection Time: 05/31/18  9:59 PM  Result Value Ref Range Status   Enterococcus species NOT DETECTED NOT DETECTED Final   Listeria monocytogenes NOT DETECTED NOT DETECTED Final   Staphylococcus species DETECTED (A) NOT DETECTED Final    Comment: CRITICAL RESULT CALLED TO, READ BACK BY AND VERIFIED WITH: Jodi Marble PharmD 16:30 06/01/18 ([email or web address])    Staphylococcus aureus (BCID) DETECTED (A) NOT DETECTED Final    Comment: Methicillin (oxacillin)-resistant Staphylococcus aureus (MRSA). MRSA is predictably resistant to beta-lactam antibiotics (except ceftaroline). Preferred therapy is vancomycin unless clinically contraindicated. Patient requires contact precautions if  hospitalized. CRITICAL RESULT CALLED TO, READ BACK BY AND VERIFIED WITH: Jodi Marble PharmD 16:30 06/01/18 ([email or web address])    Methicillin resistance DETECTED (A) NOT DETECTED Final    Comment: RESULT CALLED TO, READ BACK BY AND VERIFIED WITH: Jodi Marble PharmD 16:30 06/01/18 ([email or web address])    Streptococcus species NOT DETECTED NOT DETECTED Final   Streptococcus agalactiae NOT DETECTED NOT DETECTED Final   Streptococcus pneumoniae NOT DETECTED NOT DETECTED Final   Streptococcus pyogenes NOT DETECTED NOT DETECTED Final   Acinetobacter baumannii NOT DETECTED NOT DETECTED Final   Enterobacteriaceae species NOT DETECTED NOT DETECTED Final   Enterobacter cloacae complex NOT DETECTED NOT DETECTED Final   Escherichia coli NOT DETECTED NOT DETECTED Final   Klebsiella oxytoca  NOT DETECTED NOT DETECTED Final   Klebsiella pneumoniae NOT DETECTED NOT DETECTED Final   Proteus species NOT DETECTED NOT DETECTED Final   Serratia marcescens NOT DETECTED NOT DETECTED Final   Haemophilus influenzae NOT DETECTED NOT DETECTED Final   Neisseria meningitidis NOT DETECTED NOT DETECTED Final   Pseudomonas aeruginosa NOT DETECTED NOT DETECTED Final   Candida albicans NOT DETECTED NOT DETECTED Final   Candida glabrata NOT DETECTED NOT DETECTED Final   Candida krusei NOT DETECTED NOT DETECTED Final   Candida parapsilosis NOT DETECTED NOT DETECTED Final   Candida tropicalis NOT DETECTED NOT DETECTED Final    Comment: Performed at [facility], [address] [place], [place] [address]  Culture, Urine     Status: Abnormal   Collection Time: 06/01/18  4:47 AM  Result Value Ref Range Status   Specimen Description   Final    URINE, CLEAN CATCH Performed at [facility], [address] [place], [place] [address]    Special Requests   Final    NONE Performed at [facility], [address] [place], [place] [address]    Culture MULTIPLE SPECIES PRESENT, SUGGEST RECOLLECTION (A)  Final   Report Status 06/02/2018 FINAL  Final  Culture, blood (routine x 2)     Status: None   Collection Time: 06/02/18  8:03 AM  Result Value Ref Range Status   Specimen Description   Final    BLOOD RIGHT HAND Performed at [facility], [address], [place], [place] [address]    Special Requests   Final    BOTTLES DRAWN AEROBIC ONLY Blood Culture results may not be  optimal due to an inadequate volume of blood received in culture bottles Performed at [facility], [address] [place], [place] [address]    Culture   Final    NO GROWTH 5 DAYS Performed at [facility], [address], [place], [place] [address]    Report Status 06/07/2018 FINAL  Final  Culture, blood (routine x 2)     Status: None    Collection Time: 06/02/18  8:03 AM  Result Value Ref Range Status   Specimen Description   Final    BLOOD RIGHT HAND Performed at [facility], [address], [place], [place] [address]    Special Requests   Final    BOTTLES DRAWN AEROBIC ONLY Blood Culture adequate volume Performed at [facility], [address] [place], [place] [address]    Culture   Final    NO GROWTH 5 DAYS Performed at [facility], [address] [place], [place] [address]    Report Status 06/07/2018 FINAL  Final      Studies: No results found.  Scheduled Meds: . amLODipine  10 mg Oral Daily  . aspirin  325 mg Oral Daily  . atorvastatin  80 mg Oral q1800  . donepezil  5 mg Oral QHS  . enoxaparin (LOVENOX) injection  40 mg Subcutaneous Daily  . guaiFENesin  600 mg Oral BID  . insulin aspart  0-9 Units Subcutaneous TID WC  . insulin glargine  18 Units Subcutaneous Daily  . levETIRAcetam  500 mg Oral BID  . loratadine  10 mg Oral Daily  . magnesium oxide  400 mg Oral Daily  . metoprolol tartrate  25 mg Oral BID  . saccharomyces boulardii  250 mg Oral BID    Continuous Infusions: . vancomycin 1,250 mg (06/06/18 1841)     LOS: 6 days     Darlin Drop, MD [facility] Pager [phone]  If 7PM-7AM, please contact night-coverage [email or web address] Password [facility] 06/07/2018, 5:05 PM

## 2018-06-07 NOTE — Progress Notes (Addendum)
Patient ID: Paula Reed, female   DOB: 1946-03-18, 73 y.o.   MRN: [id number]          [facility] for Infectious Disease    Date of Admission:  05/31/2018   Day 7 vancomycin         Paula Reed is improving on therapy for Staph aureus bacteremia complicating influenza.  Repeat blood cultures are negative.  TEE today showed:   Normal LV function; small oscillating density on MV; cannot R/O vegetation; trace MR; positive saline microcavitation study suggestive of PFO. Pt likely needs full 6 weeks of antibiotics and repeat TEE afterwards.  Given these findings and a possible recent embolic stroke I recommend a total of 6 weeks of IV antibiotic therapy.  The antibiotic susceptibility panel reported the isolate to be MSSA but the BCID detected methicillin resistance.  Therefore I would recommend continuing vancomycin to complete her therapy.           Diagnosis: Bacteremia  Culture Result: MSSA/MRSA  Allergies  Allergen Reactions  . Chlorthalidone Rash    Per [facility] records, pt may have had a rash rxn to chlorthalidone    OPAT Orders Discharge antibiotics: Per pharmacy protocol vancomycin Aim for Vancomycin trough 15-20 (unless otherwise indicated) Duration: 6 weeks End Date: 07/12/2018  [facility] Care Per Protocol:  Labs weekly while on IV antibiotics: _x_ CBC with differential _x_ BMP __ CMP __ CRP __ ESR _x_ Vancomycin trough __ CK  _x_ Please pull PIC at completion of IV antibiotics __ Please leave PIC in place until doctor has seen patient or been notified  Clinic Follow Up Appt: Follow-up with me on 07/06/2018  Michel Bickers, MD [facility] [phone] pager   [phone] cell 06/07/2018, 2:01 PM

## 2018-06-08 ENCOUNTER — Other Ambulatory Visit: Payer: Self-pay | Admitting: Family Medicine

## 2018-06-08 ENCOUNTER — Encounter (HOSPITAL_COMMUNITY): Payer: Self-pay | Admitting: Cardiology

## 2018-06-08 LAB — GLUCOSE, CAPILLARY
Glucose-Capillary: 119 mg/dL — ABNORMAL HIGH (ref 70–99)
Glucose-Capillary: 147 mg/dL — ABNORMAL HIGH (ref 70–99)

## 2018-06-08 LAB — CREATININE, SERUM
Creatinine, Ser: 0.58 mg/dL (ref 0.44–1.00)
GFR calc Af Amer: >60 mL/min (ref >60)
GFR calc non Af Amer: >60 mL/min (ref >60)

## 2018-06-08 MED ORDER — ASPIRIN 325 MG PO TABS: 325.0000 mg | ORAL_TABLET | Freq: Every day | ORAL | 0 refills | Status: AC

## 2018-06-08 MED ORDER — ATORVASTATIN CALCIUM 80 MG PO TABS: 80.0000 mg | ORAL_TABLET | Freq: Every day | ORAL | 0 refills | Status: DC

## 2018-06-08 MED ORDER — SACCHAROMYCES BOULARDII 250 MG PO CAPS: 250.0000 mg | ORAL_CAPSULE | Freq: Two times a day (BID) | ORAL | 0 refills | Status: AC

## 2018-06-08 MED ORDER — HEPARIN SOD (PORK) LOCK FLUSH 100 UNIT/ML IV SOLN
250.0000 [IU] | INTRAVENOUS | Status: AC | PRN
  Administered 2018-06-08: 250 [IU]

## 2018-06-08 MED ORDER — VANCOMYCIN IV (FOR PTA / DISCHARGE USE ONLY): 750.0000 mg | Freq: Two times a day (BID) | INTRAVENOUS | 0 refills | Status: AC

## 2018-06-08 MED ORDER — AMLODIPINE BESYLATE 10 MG PO TABS: 10.0000 mg | ORAL_TABLET | Freq: Every day | ORAL | 0 refills | Status: DC

## 2018-06-08 NOTE — Clinical Social Work Placement (Addendum)
D/C Summary sent.  Nurse call report to: [phone] [facility] arranged to transport 3:30pm  CLINICAL SOCIAL WORK PLACEMENT  NOTE  Date:  06/08/2018  Patient Details  Name: Paula Reed MRN: [id number] Date of Birth: 12/12/1945  Clinical Social Work is seeking post-discharge placement for this patient at the Skilled  Nursing Facility level of care (*CSW will initial, date and re-position this form in  chart as items are completed):  Yes   Patient/family provided with [facility] Clinical Social Work Department's list of facilities offering this level of care within the geographic area requested by the patient (or if unable, by the patient's family).  Yes   Patient/family informed of their freedom to choose among providers that offer the needed level of care, that participate in Medicare, Medicaid or managed care program needed by the patient, have an available bed and are willing to accept the patient.  Yes   Patient/family informed of [facility]'s ownership interest in [facility] and [facility], as well as of the fact that they are under no obligation to receive care at these facilities.  PASRR submitted to EDS on       PASRR number received on       Existing PASRR number confirmed on       FL2 transmitted to all facilities in geographic area requested by pt/family on       FL2 transmitted to all facilities within larger geographic area on 06/08/18     Patient informed that his/her managed care company has contracts with or will negotiate with certain facilities, including the following:  [facility]         Patient/family informed of bed offers received.  Patient chooses bed at [facility]     Physician recommends and patient chooses bed at      Patient to be transferred to [facility] on 06/08/18.  Patient to be transferred to facility by [facility]      Patient family notified on 06/08/18 of transfer.  Name of family member notified:   Spouse [name]      PHYSICIAN Please prepare priority discharge summary, including medications     Additional Comment:    _______________________________________________ Clearance Coots, LCSW 06/08/2018, 10:24 AM

## 2018-06-08 NOTE — Discharge Summary (Signed)
Physician Discharge Summary  Paula Reed HGD:[id number] DOB: 01-11-1946 DOA: 05/31/2018  PCP: Leeanne Rio, MD  Admit date: 05/31/2018 Discharge date: 06/08/2018  Admitted From: Home Disposition:  SNF  Recommendations for Outpatient Follow-up:  1. Follow up with PCP in 1-2 weeks 2. Routine picc care 3. Please d/c picc after completing IV vanc after 07/12/18 dose  Discharge Condition:Stable CODE STATUS:Full Diet recommendation: Diabetic   Brief/Interim Summary: 73 y.o.female,with history of dementia, hyperlipidemia, hypertension, diabetes mellitus type 2, seizure disorder came to hospital after patient fell at home. In the ED patient was found to be febrile with a temp of 101.9. She had mildly abnormal UA and started on ceftriaxone and Zithromax for possible UTI and questionable pneumonia. Her O2 sats were 91% on room air.Influenza PCR came back positive for influenza A. Patient also found to have acute/subacute stroke  Discharge Diagnoses:  Principal Problem:   MRSA bacteremia Active Problems:   Diabetes mellitus, type II ([place])   Hypertension   Hyperlipidemia   Probable Alzheimer's dementia without behavioral disturbance   Seizures ([place])   Influenza A  MRSA sepsis/InfluenzaA- MRSA bacteremia Presented with fever, influenza PCR positive for influenza A, patient empirically started on antibiotics to prevent superimposed infection but ID felt she has no evidence off pneumonia on chest x-ray and clinically. discontinued ceftriaxone and Zithromax.continuedonTamiflu 75 mg p.o. twice daily. blood culture positive for gram-positive cocci, MRSA. Source unclear.2-D echo withoutvegetations. Patient started on vancomycin IV. Sepsis physiology is improving. PICC line was placed 06/06/2018.  TEE done 06/07/18. TEE done could not r/o vegetation. Suspected PFO. Will complete 6 weeks IV vancomycin as recommended by ID. End date 07/12/18.  2. Possible UTI-patient has mildly  abnormal UA, denies any symptoms. urine culture nonspecific  3. Diabetes mellitus type 2-continue with Lantus 18 units subcu daily,Continuesliding scale insulin with NovoLog. hemoglobin A1c8.9. Triglycerides 363, LDL not calculated, HDL less than 10  4. Dementia-no behavior disturbance, continue Aricept.  Stable.  5. Acute CVAMRI of the brainConfirmed acute/subacute infarction . Neurology was consulted with recommendations to increase aspirin to 325 mg a day. Started patient on a statin Lipitor 80 mg daily. Patient seen by PT OT speech . PT recommends SNF.Follow-up with outpatient neurology stroke clinic at [facility] neurology in 4 to 6 weeks after discharge  6. History of seizures-continued Keppra 552m bid.  Stable.  7. Prolonged QTc interval-QTC is prolonged to 525, continued on tele  8. Fall/bruising-patient noted to have bruising on her bilateral knees and lips. She is unable to provide any history regarding falls.Patient's husband is debilitated and unable to take care of her. She will need SNF  9. Abnormal troponin-suspect secondary to demand ischemia in the setting of influenza/acute bronchitis/sepsis/CVA. 2-D echodid not show any significantwall motion abnormalities. 10. Physical debility/ambulatory dysfunction: PT recommend SNF   Discharge Instructions  Discharge Instructions    Home infusion instructions [facility] May follow [place] Protocol; May administer Cathflo as needed to maintain patency of vascular access device.; [place] [facility] vascular access device[facility]: 0.9% NaCl pre/post medica...   Complete by:  As directed    Instructions:  May follow [place] Protocol   Instructions:  May administer Cathflo as needed to maintain patency of vascular access device.   Instructions:  Flushing of vascular access device: per [facility]: 0.9% NaCl pre/post medication administration and prn patency; Heparin 100 u/ml,  539mfor implanted ports and Heparin 10u/ml, 35m79mor all other central venous catheters.   Instructions:  May follow [facility] Anaphylaxis Protocol for First Dose Administration in the home: 0.9% NaCl at 25-50 ml/hr to maintain IV access for protocol meds. Epinephrine 0.3 ml IV/IM PRN and Benadryl 25-50 IV/IM PRN s/s of anaphylaxis.   Instructions:  [facility] Infusion Coordinator (RN) to assist per patient IV care needs in the home PRN.     Allergies as of 06/08/2018      Reactions   Chlorthalidone Rash   Per [facility] records, pt may have had a rash rxn to chlorthalidone      Medication List    TAKE these medications   amLODipine 10 MG tablet Commonly known as:  NORVASC Take 1 tablet (10 mg total) by mouth daily for 30 days. Start taking on:  June 09, 2018   aspirin 325 MG tablet Take 1 tablet (325 mg total) by mouth daily for 30 days. Start taking on:  June 09, 2018 What changed:    medication strength  how much to take  additional instructions   atorvastatin 80 MG tablet Commonly known as:  LIPITOR Take 1 tablet (80 mg total) by mouth daily at 6 PM for 30 days.   cetirizine 5 MG tablet Commonly known as:  ZYRTEC Take 1 tablet (5 mg total) by mouth daily.   COUGH RELIEF PO Take 10 mLs by mouth daily as needed (cough).   donepezil 5 MG tablet Commonly known as:  ARICEPT TAKE 1 TABLET (5 MG TOTAL) AT BEDTIME BY MOUTH.   glucose blood test strip Commonly known as:  ONETOUCH VERIO Check sugar twice daily   insulin glargine 100 UNIT/ML injection Commonly known as:  LANTUS INJECT 18 UNITS INTO THE SKIN AT BEDTIME.   INSULIN SYRINGE .3CC/31GX5/16" 31G X 5/16" 0.3 ML Misc Use to inject lantus daily   levETIRAcetam 500 MG tablet Commonly known as:  KEPPRA TAKE 1 TABLET BY MOUTH TWICE A DAY   metoprolol tartrate 25 MG tablet Commonly known as:  LOPRESSOR TAKE 1 TABLET BY MOUTH TWICE A DAY   ONE TOUCH DELICA LANCING DEV Misc Check sugar twice daily    ONETOUCH DELICA LANCETS 44R Misc Check sugar twice daily   ONETOUCH VERIO w/Device Kit 1 kit by Does not apply route daily. Check sugar twice daily   saccharomyces boulardii 250 MG capsule Commonly known as:  FLORASTOR Take 1 capsule (250 mg total) by mouth 2 (two) times daily for 30 days.   triamcinolone cream 0.1 % Commonly known as:  KENALOG Apply 1 application topically 2 (two) times daily as needed.   triamcinolone ointment 0.5 % Commonly known as:  KENALOG Apply 1 application topically 2 (two) times daily. Apply to area on back.   vancomycin  IVPB Inject 750 mg into the vein every 12 (twelve) hours. Indication: bacteremia  Last Day of Therapy:  07/12/2018 Labs - Weekly: CBC/D, BMP, and vancomycin trough.            Home Infusion Instuctions  (From admission, onward)         Start     Ordered   06/08/18 0000  Home infusion instructions [facility] May follow [place] Dosing Protocol; May administer Cathflo as needed to maintain patency of vascular access device.; Flushing of vascular access device: per [facility] Protocol: 0.9% NaCl pre/post medica...    Question Answer Comment  Instructions May follow [place] Dosing Protocol   Instructions May administer Cathflo as needed to maintain patency of vascular access device.   Instructions Flushing of vascular access  device: per [facility] Protocol: 0.9% NaCl pre/post medication administration and prn patency; Heparin 100 u/ml, 6m for implanted ports and Heparin 10u/ml, 52mfor all other central venous catheters.   Instructions May follow [facility] Anaphylaxis Protocol for First Dose Administration in the home: 0.9% NaCl at 25-50 ml/hr to maintain IV access for protocol meds. Epinephrine 0.3 ml IV/IM PRN and Benadryl 25-50 IV/IM PRN s/s of anaphylaxis.   Instructions [facility] Infusion Coordinator (RN) to assist per patient IV care needs in the home PRN.      06/08/18 1220           Durable Medical Equipment   (From admission, onward)         Start     Ordered   06/07/18 0616  For home use only DME Walker rolling  Once    Comments:  Rolling walker with 5 inches wheels  Question:  Patient needs a walker to treat with the following condition  Answer:  Ambulatory dysfunction   06/07/18 0616         Contact information for after-discharge care    Destination    [facility] Preferred SNF .   Service:  Skilled Nursing Contact information: [id number] [phone]           Allergies  Allergen Reactions  . Chlorthalidone Rash    Per [facility] records, pt may have had a rash rxn to chlorthalidone    Consultations:  ID  Cardiology  Procedures/Studies: Ct Angio Head W Or Wo Contrast  Result Date: 06/03/2018 CLINICAL DATA:  Stroke follow-up. Punctate recent right pontine infarct on MRI. EXAM: CT ANGIOGRAPHY HEAD AND NECK TECHNIQUE: Multidetector CT imaging of the head and neck was performed using the standard protocol during bolus administration of intravenous contrast. Multiplanar CT image reconstructions and MIPs were obtained to evaluate the vascular anatomy. Carotid stenosis measurements (when applicable) are obtained utilizing NASCET criteria, using the distal internal carotid diameter as the denominator. CONTRAST:  10032mSOVUE-370 IOPAMIDOL (ISOVUE-370) INJECTION 76% COMPARISON:  Brain MRI 06/02/2018.  No prior angiographic imaging. FINDINGS: CTA NECK FINDINGS Aortic arch: Normal variant aortic arch branching pattern with common origin of the brachiocephalic and left common carotid arteries. No definite significant arch vessel origin stenosis within limitations of motion and streak artifact. Mild-to-moderate calcified plaque in the aortic arch and proximal arch vessels. Right carotid system: Patent with predominantly calcified plaque most notable in the proximal ICA. Precise quantification of potential ICA stenosis is limited by  motion artifact. No definite 50% or greater stenosis is identified. Left carotid system: Patent with scattered nonstenotic plaque in the common carotid artery. More notable calcified plaque in the proximal ICA with focal severe motion artifact limiting assessment for stenosis. Vertebral arteries: The vertebral arteries are patent and codominant. Assessment for stenosis is limited by motion artifact throughout the neck. Skeleton: Severe left facet arthrosis at C3-4. Other neck: Limited assessment of the pharynx due to motion artifact. Mild-to-moderate mucosal thickening in the paranasal sinuses. Upper chest: Clear lung apices. Review of the MIP images confirms the above findings CTA HEAD FINDINGS Anterior circulation: The internal carotid arteries are patent from skull base to carotid termini with minimal calcified plaque not resulting in significant stenosis. ACAs and MCAs are patent without evidence of proximal branch occlusion or significant proximal stenosis. There is more notable motion artifact through the distal branch vessels. No aneurysm is identified. Posterior circulation: The intracranial vertebral arteries are patent to the basilar.  The basilar artery is widely patent. Patent SCA origins are identified bilaterally. There are medium sized posterior communicating arteries bilaterally. The PCAs are patent without evidence of significant proximal stenosis. No aneurysm is identified. Venous sinuses: Patent. Anatomic variants: None. Delayed phase: No abnormal enhancement. Review of the MIP images confirms the above findings IMPRESSION: 1. Motion degraded examination without large vessel occlusion. 2. Limited assessment for arterial stenosis in the neck, particularly in the proximal left ICA. 3. No evidence of significant proximal intracranial stenosis. 4.  Aortic Atherosclerosis (ICD10-I70.0). Electronically Signed   By: Logan Bores M.D.   On: 06/03/2018 15:06   Ct Head Wo Contrast  Result Date:  05/31/2018 CLINICAL DATA:  Status post fall twice today. EXAM: CT HEAD WITHOUT CONTRAST TECHNIQUE: Contiguous axial images were obtained from the base of the skull through the vertex without intravenous contrast. COMPARISON:  None. FINDINGS: Brain: No evidence of acute infarction, hemorrhage, hydrocephalus, extra-axial collection or mass lesion/mass effect. Vascular: No hyperdense vessel or unexpected calcification. Skull: No osseous abnormality. Sinuses/Orbits: Visualized paranasal sinuses are clear. Visualized mastoid sinuses are clear. Visualized orbits demonstrate no focal abnormality. Other: None IMPRESSION: No acute intracranial pathology. Electronically Signed   By: Kathreen Devoid   On: 05/31/2018 22:19   Ct Angio Neck W Or Wo Contrast  Result Date: 06/03/2018 CLINICAL DATA:  Stroke follow-up. Punctate recent right pontine infarct on MRI. EXAM: CT ANGIOGRAPHY HEAD AND NECK TECHNIQUE: Multidetector CT imaging of the head and neck was performed using the standard protocol during bolus administration of intravenous contrast. Multiplanar CT image reconstructions and MIPs were obtained to evaluate the vascular anatomy. Carotid stenosis measurements (when applicable) are obtained utilizing NASCET criteria, using the distal internal carotid diameter as the denominator. CONTRAST:  143m ISOVUE-370 IOPAMIDOL (ISOVUE-370) INJECTION 76% COMPARISON:  Brain MRI 06/02/2018.  No prior angiographic imaging. FINDINGS: CTA NECK FINDINGS Aortic arch: Normal variant aortic arch branching pattern with common origin of the brachiocephalic and left common carotid arteries. No definite significant arch vessel origin stenosis within limitations of motion and streak artifact. Mild-to-moderate calcified plaque in the aortic arch and proximal arch vessels. Right carotid system: Patent with predominantly calcified plaque most notable in the proximal ICA. Precise quantification of potential ICA stenosis is limited by motion artifact. No  definite 50% or greater stenosis is identified. Left carotid system: Patent with scattered nonstenotic plaque in the common carotid artery. More notable calcified plaque in the proximal ICA with focal severe motion artifact limiting assessment for stenosis. Vertebral arteries: The vertebral arteries are patent and codominant. Assessment for stenosis is limited by motion artifact throughout the neck. Skeleton: Severe left facet arthrosis at C3-4. Other neck: Limited assessment of the pharynx due to motion artifact. Mild-to-moderate mucosal thickening in the paranasal sinuses. Upper chest: Clear lung apices. Review of the MIP images confirms the above findings CTA HEAD FINDINGS Anterior circulation: The internal carotid arteries are patent from skull base to carotid termini with minimal calcified plaque not resulting in significant stenosis. ACAs and MCAs are patent without evidence of proximal branch occlusion or significant proximal stenosis. There is more notable motion artifact through the distal branch vessels. No aneurysm is identified. Posterior circulation: The intracranial vertebral arteries are patent to the basilar. The basilar artery is widely patent. Patent SCA origins are identified bilaterally. There are medium sized posterior communicating arteries bilaterally. The PCAs are patent without evidence of significant proximal stenosis. No aneurysm is identified. Venous sinuses: Patent. Anatomic variants: None. Delayed phase: No abnormal enhancement. Review of  the MIP images confirms the above findings IMPRESSION: 1. Motion degraded examination without large vessel occlusion. 2. Limited assessment for arterial stenosis in the neck, particularly in the proximal left ICA. 3. No evidence of significant proximal intracranial stenosis. 4.  Aortic Atherosclerosis (ICD10-I70.0). Electronically Signed   By: Logan Bores M.D.   On: 06/03/2018 15:06   Mr Brain Wo Contrast  Result Date: 06/02/2018 CLINICAL DATA:  73  y/o F; altered mental status, likely secondary to ETOH/substance abuse. History of early dementia. EXAM: MRI HEAD WITHOUT CONTRAST TECHNIQUE: Multiplanar, multiecho pulse sequences of the brain and surrounding structures were obtained without intravenous contrast. COMPARISON:  05/31/2018 CT head.  09/19/2015 MRI head. FINDINGS: Brain: Punctate focus of reduced diffusion within right hemi pons (series 3, image 22 and series 5, image 20) compatible with acute/early subacute infarction. No hemorrhage or mass effect. Small chronic infarcts are present within the left cerebellum and left thalamus. Punctate nonspecific T2 FLAIR hyperintensities in subcortical and periventricular white matter are compatible with mild chronic microvascular ischemic changes. Mild volume loss of the brain. Punctate focus of chronic microhemorrhage within left posterior temporal lobe. No extra-axial collection, hydrocephalus, mass effect, or herniation. Partially empty sella turcica. Vascular: Normal flow voids. Skull and upper cervical spine: Normal marrow signal. Sinuses/Orbits: Moderate diffuse paranasal sinus disease. No abnormal signal of mastoid air cells. Orbits are unremarkable. Other: None. IMPRESSION: 1. Punctate focus of acute/early subacute infarction within the right hemi pons. No associated hemorrhage or mass effect. 2. Mild chronic microvascular ischemic changes and volume loss of the brain. Small chronic infarcts within left pons and left thalamus. 3. Partially empty sella turcica. 4. Moderate diffuse paranasal sinus disease. These results will be called to the ordering clinician or representative by the Radiologist Assistant, and communication documented in the PACS or zVision Dashboard. Electronically Signed   By: Kristine Garbe M.D.   On: 06/02/2018 22:15   Dg Chest Portable 1 View  Result Date: 06/01/2018 CLINICAL DATA:  Hypoxia EXAM: PORTABLE CHEST 1 VIEW COMPARISON:  12/26/2014 FINDINGS: The heart size and  mediastinal contours are within normal limits. Both lungs are clear. The visualized skeletal structures are unremarkable. IMPRESSION: No active disease. Electronically Signed   By: Kathreen Devoid   On: 06/01/2018 00:34   Dg Finger Middle Left  Result Date: 05/10/2018 CLINICAL DATA:  73 year old female with pain third proximal interphalangeal joint space for the past week. No injury. Initial encounter. EXAM: LEFT MIDDLE FINGER 2+V COMPARISON:  None. FINDINGS: Question tiny erosion ulnar aspect left third middle phalanx proximal aspect. Otherwise negative plain film exam of the left third finger. IMPRESSION: Question tiny erosion ulnar aspect left third middle phalanx proximal aspect. Otherwise negative. Electronically Signed   By: Genia Del M.D.   On: 05/10/2018 17:50   Vas US Carotid (at [facility] Only)  Result Date: 06/03/2018 Carotid Arterial Duplex Study Indications: CVA and punctate focus of acute/early subacute infarction. Performing Technologist: June Leap RDMS, RVT  Examination Guidelines: A complete evaluation includes B-mode imaging, spectral Doppler, color Doppler, and power Doppler as needed of all accessible portions of each vessel. Bilateral testing is considered an integral part of a complete examination. Limited examinations for reoccurring indications may be performed as noted.  Right Carotid Findings: +----------+--------+--------+--------+------------+--------+           PSV cm/sEDV cm/sStenosisDescribe    Comments +----------+--------+--------+--------+------------+--------+ CCA Prox  80      18  heterogenous         +----------+--------+--------+--------+------------+--------+ CCA Distal103     19                                   +----------+--------+--------+--------+------------+--------+ ICA Prox  83      17      1-39%   heterogenous         +----------+--------+--------+--------+------------+--------+ ICA Distal93      21                                    +----------+--------+--------+--------+------------+--------+ ECA       76      7                                    +----------+--------+--------+--------+------------+--------+ +----------+--------+-------+----------------+-------------------+           PSV cm/sEDV cmsDescribe        Arm Pressure (mmHG) +----------+--------+-------+----------------+-------------------+ Subclavian182            Multiphasic, WNL                    +----------+--------+-------+----------------+-------------------+ +---------+--------+--+--------+-+---------+ VertebralPSV cm/s35EDV cm/s8Antegrade +---------+--------+--+--------+-+---------+  Left Carotid Findings: +----------+--------+--------+--------+------------+--------+           PSV cm/sEDV cm/sStenosisDescribe    Comments +----------+--------+--------+--------+------------+--------+ CCA Prox  94      15              heterogenous         +----------+--------+--------+--------+------------+--------+ CCA Distal87      7               heterogenous         +----------+--------+--------+--------+------------+--------+ ICA Prox  103     27      1-39%   heterogenous         +----------+--------+--------+--------+------------+--------+ ICA Distal65      16                                   +----------+--------+--------+--------+------------+--------+ ECA       74      14                                   +----------+--------+--------+--------+------------+--------+ +----------+--------+--------+----------------+-------------------+ SubclavianPSV cm/sEDV cm/sDescribe        Arm Pressure (mmHG) +----------+--------+--------+----------------+-------------------+           146             Multiphasic, WNL                    +----------+--------+--------+----------------+-------------------+ +---------+--------+--+--------+--+---------+ VertebralPSV cm/s92EDV cm/s30Antegrade  +---------+--------+--+--------+--+---------+  Summary: Right Carotid: Velocities in the right ICA are consistent with a 1-39% stenosis. Left Carotid: Velocities in the left ICA are consistent with a 1-39% stenosis. Vertebrals: Bilateral vertebral arteries demonstrate antegrade flow. *See table(s) above for measurements and observations.  Electronically signed by Antony Contras MD on 06/03/2018 at 1:38:45 PM.    Final    [place] Ekg Site Rite  Result Date: 06/05/2018 If Site Rite image not attached, placement could not be confirmed due to current cardiac rhythm.   Subjective: Patent attorney  to start therapy  Discharge Exam: Vitals:   06/08/18 0429 06/08/18 1033  BP: (!) 153/74 (!) 176/68  Pulse: 64 67  Resp: 18   Temp: 98.2 F (36.8 C)   SpO2: 93%    Vitals:   06/07/18 1600 06/07/18 2127 06/08/18 0429 06/08/18 1033  BP: (!) 142/70 (!) 156/66 (!) 153/74 (!) 176/68  Pulse: 63 70 64 67  Resp: 19 18 18    Temp: 98 F (36.7 C) 98.3 F (36.8 C) 98.2 F (36.8 C)   TempSrc: Oral     SpO2: 96% 91% 93%   Weight:      Height:        General: Pt is alert, awake, not in acute distress Cardiovascular: RRR, S1/S2 +, no rubs, no gallops Respiratory: CTA bilaterally, no wheezing, no rhonchi Abdominal: Soft, NT, ND, bowel sounds + Extremities: no edema, no cyanosis   The results of significant diagnostics from this hospitalization (including imaging, microbiology, ancillary and laboratory) are listed below for reference.     Microbiology: Recent Results (from the past 240 hour(s))  Blood Culture (routine x 2)     Status: None   Collection Time: 05/31/18  9:41 PM  Result Value Ref Range Status   Specimen Description   Final    BLOOD RIGHT ANTECUBITAL Performed at [facility] [address], [place], [place] [address]    Special Requests   Final    BOTTLES DRAWN AEROBIC AND ANAEROBIC Blood Culture adequate volume Performed at [facility] [address]  [place], [place] [address]    Culture   Final    NO GROWTH 5 DAYS Performed at [facility], [place] [address], [place], [place] [address]    Report Status 06/05/2018 FINAL  Final  Blood Culture (routine x 2)     Status: Abnormal   Collection Time: 05/31/18  9:59 PM  Result Value Ref Range Status   Specimen Description   Final    BLOOD LEFT HAND Performed at [facility] [address] [place], [place] [address]    Special Requests   Final    BOTTLES DRAWN AEROBIC AND ANAEROBIC Blood Culture adequate volume Performed at [facility] [address], [place], [place] [address]    Culture  Setup Time   Final    GRAM POSITIVE COCCI IN CLUSTERS IN BOTH AEROBIC AND ANAEROBIC BOTTLES CRITICAL RESULT CALLED TO, READ BACK BY AND VERIFIED WITH: Elenore Paddy PharmD 16:30 06/01/18 ([email or web address]) Performed at [facility], [place] [address], [place], [place] [address]    Culture STAPHYLOCOCCUS AUREUS (A)  Final   Report Status 06/03/2018 FINAL  Final   Organism ID, Bacteria STAPHYLOCOCCUS AUREUS  Final      Susceptibility   Staphylococcus aureus - MIC*    CIPROFLOXACIN <=0.5 SENSITIVE Sensitive     ERYTHROMYCIN >=8 RESISTANT Resistant     GENTAMICIN <=0.5 SENSITIVE Sensitive     OXACILLIN <=0.25 SENSITIVE Sensitive     TETRACYCLINE <=1 SENSITIVE Sensitive     VANCOMYCIN 1 SENSITIVE Sensitive     TRIMETH/SULFA <=10 SENSITIVE Sensitive     CLINDAMYCIN <=0.25 SENSITIVE Sensitive     RIFAMPIN <=0.5 SENSITIVE Sensitive     Inducible Clindamycin NEGATIVE Sensitive     * STAPHYLOCOCCUS AUREUS  Blood Culture ID Panel (Reflexed)     Status: Abnormal   Collection Time: 05/31/18  9:59 PM  Result Value Ref Range Status   Enterococcus species NOT DETECTED  NOT DETECTED Final   Listeria monocytogenes NOT DETECTED NOT DETECTED Final   Staphylococcus species DETECTED (A) NOT DETECTED Final    Comment: CRITICAL RESULT CALLED TO, READ BACK BY AND VERIFIED WITH: Elenore Paddy PharmD 16:30 06/01/18 ([email or web address])    Staphylococcus aureus (BCID) DETECTED (A) NOT DETECTED Final    Comment: Methicillin (oxacillin)-resistant Staphylococcus aureus (MRSA). MRSA is predictably resistant to beta-lactam antibiotics (except ceftaroline). Preferred therapy is vancomycin unless clinically contraindicated. Patient requires contact precautions if  hospitalized. CRITICAL RESULT CALLED TO, READ BACK BY AND VERIFIED WITH: Elenore Paddy PharmD 16:30 06/01/18 ([email or web address])    Methicillin resistance DETECTED (A) NOT DETECTED Final    Comment: RESULT CALLED TO, READ BACK BY AND VERIFIED WITH: Elenore Paddy PharmD 16:30 06/01/18 ([email or web address])    Streptococcus species NOT DETECTED NOT DETECTED Final   Streptococcus agalactiae NOT DETECTED NOT DETECTED Final   Streptococcus pneumoniae NOT DETECTED NOT DETECTED Final   Streptococcus pyogenes NOT DETECTED NOT DETECTED Final   Acinetobacter baumannii NOT DETECTED NOT DETECTED Final   Enterobacteriaceae species NOT DETECTED NOT DETECTED Final   Enterobacter cloacae complex NOT DETECTED NOT DETECTED Final   Escherichia coli NOT DETECTED NOT DETECTED Final   Klebsiella oxytoca NOT DETECTED NOT DETECTED Final   Klebsiella pneumoniae NOT DETECTED NOT DETECTED Final   Proteus species NOT DETECTED NOT DETECTED Final   Serratia marcescens NOT DETECTED NOT DETECTED Final   Haemophilus influenzae NOT DETECTED NOT DETECTED Final   Neisseria meningitidis NOT DETECTED NOT DETECTED Final   Pseudomonas aeruginosa NOT DETECTED NOT DETECTED Final   Candida albicans NOT DETECTED NOT DETECTED Final   Candida glabrata NOT DETECTED NOT DETECTED Final   Candida krusei NOT DETECTED NOT DETECTED Final   Candida parapsilosis NOT DETECTED NOT DETECTED Final   Candida tropicalis NOT DETECTED NOT DETECTED Final    Comment: Performed at [facility], [place]. [address] [place], [place] [address]  Culture, Urine     Status: Abnormal   Collection Time: 06/01/18  4:47  AM  Result Value Ref Range Status   Specimen Description   Final    URINE, CLEAN CATCH Performed at [facility], [place] [address] [place], [place] [address]    Special Requests   Final    NONE Performed at [facility], [place] [address] [place], [place] [address]    Culture MULTIPLE SPECIES PRESENT, SUGGEST RECOLLECTION (A)  Final   Report Status 06/02/2018 FINAL  Final  Culture, blood (routine x 2)     Status: None   Collection Time: 06/02/18  8:03 AM  Result Value Ref Range Status   Specimen Description   Final    BLOOD RIGHT HAND Performed at [place] [address], [place], [place] [address]    Special Requests   Final    BOTTLES DRAWN AEROBIC ONLY Blood Culture results may not be optimal due to an inadequate volume of blood received in culture bottles Performed at [place] [address], [place], [place] [address]    Culture   Final    NO GROWTH 5 DAYS Performed at [facility], [place] [address], [place], [place] [address]    Report Status 06/07/2018 FINAL  Final  Culture, blood (routine x 2)     Status: None   Collection Time: 06/02/18  8:03 AM  Result Value Ref Range Status   Specimen Description   Final    BLOOD RIGHT HAND Performed at [facility]  [place] [address], [place], [place] [address]    Special Requests   Final    BOTTLES DRAWN AEROBIC ONLY Blood Culture adequate volume Performed at [place] [address], [place], [place] [address]    Culture   Final    NO GROWTH 5 DAYS Performed at [facility], [place] [address] [place], [place] [address]    Report Status 06/07/2018 FINAL  Final     Labs: BNP (last 3 results) No results for input(s): BNP in the last 8760 hours. Basic Metabolic Panel: Recent Labs  Lab 06/03/18 0321 06/04/18 0804 06/05/18 0321 06/06/18 0719 06/07/18 0322 06/08/18 0327  NA 136 136 137 135  133*  --   K 3.9 5.1 4.0 4.0 3.9  --   CL 106 106 104 102 100  --   CO2 25 21* 23 23 24   --   GLUCOSE 168* 153* 173* 156* 163*  --   BUN 10 8 10 10 13   --   CREATININE 0.70 0.61 0.65 0.64 0.59 0.58  CALCIUM 8.4* 8.7* 9.1 9.0 8.6*  --   MG  --  1.9  --   --   --   --    Liver Function Tests: Recent Labs  Lab 06/03/18 0321 06/04/18 0804 06/05/18 0321 06/06/18 0719 06/07/18 0322  AST 42* 44* 36 35 33  ALT 21 22 19 21 19   ALKPHOS 60 60 58 62 67  BILITOT 0.4 0.7 0.7 0.7 0.8  PROT 6.5 7.2 7.0 7.4 7.1  ALBUMIN 3.0* 3.3* 3.4* 3.4* 3.4*   No results for input(s): LIPASE, AMYLASE in the last 168 hours. No results for input(s): AMMONIA in the last 168 hours. CBC: Recent Labs  Lab 06/03/18 0321 06/04/18 0804 06/05/18 0321 06/06/18 0719 06/07/18 0322  WBC 3.1* 4.5 5.0 5.0 6.8  HGB 11.4* 13.1 12.5 13.2 12.5  HCT 37.6 39.8 40.6 43.9 40.2  MCV 90.8 84.7 89.0 93.4 91.2  PLT 224 273 262 248 285   Cardiac Enzymes: Recent Labs  Lab 06/01/18 1400  TROPONINI 0.06*   BNP: Invalid input(s): POCBNP CBG: Recent Labs  Lab 06/07/18 1523 06/07/18 1643 06/07/18 2125 06/08/18 0745 06/08/18 1140  GLUCAP 111* 124* 133* 119* 147*   D-Dimer No results for input(s): DDIMER in the last 72 hours. Hgb A1c No results for input(s): HGBA1C in the last 72 hours. Lipid Profile No results for input(s): CHOL, HDL, LDLCALC, TRIG, CHOLHDL, LDLDIRECT in the last 72 hours. Thyroid function studies No results for input(s): TSH, T4TOTAL, T3FREE, THYROIDAB in the last 72 hours.  Invalid input(s): FREET3 Anemia work up No results for input(s): VITAMINB12, FOLATE, FERRITIN, TIBC, IRON, RETICCTPCT in the last 72 hours. Urinalysis    Component Value Date/Time   COLORURINE YELLOW 05/31/2018 2317   APPEARANCEUR HAZY (A) 05/31/2018 2317   LABSPEC 1.025 05/31/2018 2317   PHURINE 5.0 05/31/2018 2317   GLUCOSEU 50 (A) 05/31/2018 2317   HGBUR SMALL (A) 05/31/2018 2317   BILIRUBINUR NEGATIVE  05/31/2018 2317   BILIRUBINUR small 06/11/2016 1421   KETONESUR 5 (A) 05/31/2018 2317   PROTEINUR >=300 (A) 05/31/2018 2317   UROBILINOGEN >=8.0 06/11/2016 1421   NITRITE NEGATIVE 05/31/2018 2317   LEUKOCYTESUR NEGATIVE 05/31/2018 2317   Sepsis Labs Invalid input(s): PROCALCITONIN,  WBC,  LACTICIDVEN Microbiology Recent Results (from the past 240 hour(s))  Blood Culture (routine x 2)     Status: None   Collection Time: 05/31/18  9:41 PM  Result  Value Ref Range Status   Specimen Description   Final    BLOOD RIGHT ANTECUBITAL Performed at [facility] [address], [place], [place] [address]    Special Requests   Final    BOTTLES DRAWN AEROBIC AND ANAEROBIC Blood Culture adequate volume Performed at [facility] [address] [place], [place] [address]    Culture   Final    NO GROWTH 5 DAYS Performed at [facility], [place] [address], [place], [place] [address]    Report Status 06/05/2018 FINAL  Final  Blood Culture (routine x 2)     Status: Abnormal   Collection Time: 05/31/18  9:59 PM  Result Value Ref Range Status   Specimen Description   Final    BLOOD LEFT HAND Performed at [facility] [address], [place], [place] [address]    Special Requests   Final    BOTTLES DRAWN AEROBIC AND ANAEROBIC Blood Culture adequate volume Performed at [facility] [address] [place], [place] [address]    Culture  Setup Time   Final    GRAM POSITIVE COCCI IN CLUSTERS IN BOTH AEROBIC AND ANAEROBIC BOTTLES CRITICAL RESULT CALLED TO, READ BACK BY AND VERIFIED WITH: Elenore Paddy PharmD 16:30 06/01/18 ([email or web address]) Performed at [facility], [place] [address] [place], [place] [address]    Culture STAPHYLOCOCCUS AUREUS (A)  Final   Report Status 06/03/2018 FINAL  Final   Organism ID, Bacteria STAPHYLOCOCCUS AUREUS  Final      Susceptibility   Staphylococcus aureus - MIC*    CIPROFLOXACIN <=0.5  SENSITIVE Sensitive     ERYTHROMYCIN >=8 RESISTANT Resistant     GENTAMICIN <=0.5 SENSITIVE Sensitive     OXACILLIN <=0.25 SENSITIVE Sensitive     TETRACYCLINE <=1 SENSITIVE Sensitive     VANCOMYCIN 1 SENSITIVE Sensitive     TRIMETH/SULFA <=10 SENSITIVE Sensitive     CLINDAMYCIN <=0.25 SENSITIVE Sensitive     RIFAMPIN <=0.5 SENSITIVE Sensitive     Inducible Clindamycin NEGATIVE Sensitive     * STAPHYLOCOCCUS AUREUS  Blood Culture ID Panel (Reflexed)     Status: Abnormal   Collection Time: 05/31/18  9:59 PM  Result Value Ref Range Status   Enterococcus species NOT DETECTED NOT DETECTED Final   Listeria monocytogenes NOT DETECTED NOT DETECTED Final   Staphylococcus species DETECTED (A) NOT DETECTED Final    Comment: CRITICAL RESULT CALLED TO, READ BACK BY AND VERIFIED WITH: Elenore Paddy PharmD 16:30 06/01/18 ([email or web address])    Staphylococcus aureus (BCID) DETECTED (A) NOT DETECTED Final    Comment: Methicillin (oxacillin)-resistant Staphylococcus aureus (MRSA). MRSA is predictably resistant to beta-lactam antibiotics (except ceftaroline). Preferred therapy is vancomycin unless clinically contraindicated. Patient requires contact precautions if  hospitalized. CRITICAL RESULT CALLED TO, READ BACK BY AND VERIFIED WITH: Elenore Paddy PharmD 16:30 06/01/18 ([email or web address])    Methicillin resistance DETECTED (A) NOT DETECTED Final    Comment: RESULT CALLED TO, READ BACK BY AND VERIFIED WITH: Elenore Paddy PharmD 16:30 06/01/18 ([email or web address])    Streptococcus species NOT DETECTED NOT DETECTED Final   Streptococcus agalactiae NOT DETECTED NOT DETECTED Final   Streptococcus pneumoniae NOT DETECTED NOT DETECTED Final   Streptococcus pyogenes NOT DETECTED NOT DETECTED Final   Acinetobacter baumannii NOT DETECTED NOT DETECTED Final   Enterobacteriaceae species NOT DETECTED NOT DETECTED Final   Enterobacter cloacae complex NOT DETECTED NOT DETECTED Final   Escherichia coli NOT DETECTED NOT DETECTED Final  Klebsiella oxytoca NOT DETECTED NOT DETECTED Final   Klebsiella pneumoniae NOT DETECTED NOT DETECTED Final   Proteus species NOT DETECTED NOT DETECTED Final   Serratia marcescens NOT DETECTED NOT DETECTED Final   Haemophilus influenzae NOT DETECTED NOT DETECTED Final   Neisseria meningitidis NOT DETECTED NOT DETECTED Final   Pseudomonas aeruginosa NOT DETECTED NOT DETECTED Final   Candida albicans NOT DETECTED NOT DETECTED Final   Candida glabrata NOT DETECTED NOT DETECTED Final   Candida krusei NOT DETECTED NOT DETECTED Final   Candida parapsilosis NOT DETECTED NOT DETECTED Final   Candida tropicalis NOT DETECTED NOT DETECTED Final    Comment: Performed at [facility], [place] [address], [place], [place] [address]  Culture, Urine     Status: Abnormal   Collection Time: 06/01/18  4:47 AM  Result Value Ref Range Status   Specimen Description   Final    URINE, CLEAN CATCH Performed at [facility], [place] [address] [place], [place] [address]    Special Requests   Final    NONE Performed at [facility], [place] [address], [place], [place] [address]    Culture MULTIPLE SPECIES PRESENT, SUGGEST RECOLLECTION (A)  Final   Report Status 06/02/2018 FINAL  Final  Culture, blood (routine x 2)     Status: None   Collection Time: 06/02/18  8:03 AM  Result Value Ref Range Status   Specimen Description   Final    BLOOD RIGHT HAND Performed at [facility] [address], [place], [place] [address]    Special Requests   Final    BOTTLES DRAWN AEROBIC ONLY Blood Culture results may not be optimal due to an inadequate volume of blood received in culture bottles Performed at [facility] [address] [place], [place] [address]    Culture   Final    NO GROWTH 5 DAYS Performed at [facility], [place] [address] [place], [place] [address]    Report Status 06/07/2018 FINAL  Final  Culture, blood (routine x 2)      Status: None   Collection Time: 06/02/18  8:03 AM  Result Value Ref Range Status   Specimen Description   Final    BLOOD RIGHT HAND Performed at [facility] [address] [place], [place] [address]    Special Requests   Final    BOTTLES DRAWN AEROBIC ONLY Blood Culture adequate volume Performed at [facility] [address] [place], [place] [address]    Culture   Final    NO GROWTH 5 DAYS Performed at [facility], [place] [address] [place], [place] [address]    Report Status 06/07/2018 FINAL  Final   Time spent: 30 min  SIGNED:   Marylu Lund, MD  [facility] 06/08/2018, 12:21 PM  If 7PM-7AM, please contact night-coverage

## 2018-06-13 ENCOUNTER — Ambulatory Visit: Payer: Medicare Other | Admitting: Family Medicine

## 2018-07-06 ENCOUNTER — Inpatient Hospital Stay: Payer: Medicare Other | Admitting: Internal Medicine

## 2018-07-20 ENCOUNTER — Telehealth: Payer: Self-pay | Admitting: Family Medicine

## 2018-07-20 NOTE — Telephone Encounter (Signed)
Received call from home health physical therapist Kelle Darting with [facility] who was calling as she is seeing patient after discharge from SNF. Wanted to see about scheduling follow up in our office, as patient was instructed to schedule follow up visit.  Advised we are not doing routine in-office follow ups unless there is an urgent issue, due to the COVID epidemic right now. Denny Peon was able to confirm by examining patient's arm that PICC line has been removed. Patient has no acute concerns.  Patient has home health RN also visiting the home. They will contact me if there are any concerns. Otherwise patient/husband will call for an appointment once COVID pandemic has settled down; really would like to avoid having patient come in so as to limit her exposure to COVID.  Chief Operating Officer. Latrelle Dodrill, MD

## 2018-07-22 ENCOUNTER — Telehealth: Payer: Self-pay | Admitting: *Deleted

## 2018-07-22 NOTE — Telephone Encounter (Signed)
Paula Reed wanted to let PCP know that she went out to check on pt today and she has no further needs.  [name], CMA

## 2018-08-22 ENCOUNTER — Telehealth: Payer: Self-pay | Admitting: *Deleted

## 2018-08-22 DIAGNOSIS — L602 Onychogryphosis: Secondary | ICD-10-CM

## 2018-08-22 NOTE — Telephone Encounter (Signed)
I am happy to place a referral to podiatry for patient  Regarding f/u, in light of COVID pandemic it would probably be in patient's best interest if we did a virtual visit rather than in-person. Red team, can you contact patient's husband to schedule virtual visit? I have available times on my individual schedule beginning 5/1.  Thanks! Latrelle Dodrill, MD

## 2018-08-22 NOTE — Telephone Encounter (Signed)
Pts husband is calling for 2 reasons:  1. Referral to podiatry for toenail care  2. Wants to know when pt needs to be seen again since being D/C from nursing home.  [name], CMA

## 2018-08-23 NOTE — Telephone Encounter (Signed)
Scheduled for telephone visit with PCP 5/1.

## 2018-08-25 ENCOUNTER — Other Ambulatory Visit: Payer: Self-pay | Admitting: Family Medicine

## 2018-08-26 ENCOUNTER — Other Ambulatory Visit: Payer: Self-pay

## 2018-08-26 ENCOUNTER — Encounter: Payer: Self-pay | Admitting: Family Medicine

## 2018-08-26 ENCOUNTER — Telehealth (INDEPENDENT_AMBULATORY_CARE_PROVIDER_SITE_OTHER): Payer: Medicare Other | Admitting: Family Medicine

## 2018-08-26 DIAGNOSIS — G309 Alzheimer's disease, unspecified: Secondary | ICD-10-CM

## 2018-08-26 DIAGNOSIS — I639 Cerebral infarction, unspecified: Secondary | ICD-10-CM | POA: Insufficient documentation

## 2018-08-26 DIAGNOSIS — E1149 Type 2 diabetes mellitus with other diabetic neurological complication: Secondary | ICD-10-CM | POA: Diagnosis not present

## 2018-08-26 DIAGNOSIS — Z794 Long term (current) use of insulin: Secondary | ICD-10-CM | POA: Diagnosis not present

## 2018-08-26 DIAGNOSIS — F0280 Dementia in other diseases classified elsewhere without behavioral disturbance: Secondary | ICD-10-CM

## 2018-08-26 MED ORDER — CETIRIZINE HCL 5 MG PO TABS: 5.0000 mg | ORAL_TABLET | Freq: Every day | ORAL | 1 refills | Status: DC

## 2018-08-26 MED ORDER — ATORVASTATIN CALCIUM 80 MG PO TABS: 80.0000 mg | ORAL_TABLET | Freq: Every day | ORAL | 1 refills | Status: DC

## 2018-08-26 MED ORDER — INSULIN GLARGINE 100 UNIT/ML ~~LOC~~ SOLN: SUBCUTANEOUS | 2 refills | Status: DC

## 2018-08-26 NOTE — Progress Notes (Signed)
[facility] Telemedicine Visit  Patient consented to have virtual visit. 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 Completed 6 weeks of IV vancomycin and PICC line has been pulled. Also in hospital had MRI confirming acute/subacute infarction.   Stroke - Taking aspirin daily. Is not taking lipitor currently, unclear why, though husband has concerns about lipitor causing strokes and heart attacks, education provided today. Has not followed up with neurology. Nurses are still coming out to the house once per week and monitoring her vitals, per husband vitals have all been good.   Diabetes - currently taking lantus 18 u at bedtime. Sugars are running in the 100s. A1c was 8.9 in February. Sugar never goes below 100 or over 200. Needs lantus refilled.  Dementia - still doing some chores around the house. No wandering or risky behaviors. Taking donepezil.  Toenails need trimming. Already has referral to podiatry in place.  ROS: per HPI  Pertinent PMHx: history of type 2 diabetes, hypertension, seizures, alzheimers dementia, hyperlipidemia   Exam:  Respiratory: patient speaks in full sentences over the phone without any distress Neuro: speech normal and intelligible  Assessment/Plan:  Diabetes mellitus, type II (HCC) Sugars well controlled by history. Continue current dose of lantus. Follow up in office in 3 months (or can do virtual office visit if COVID pandemic still of concern at that time).  Ischemic cerebrovascular accident (CVA) (HCC) Add back  lipitor given ischemic CVA. rx sent in. Educated husband that statins do not cause strokes and heart attacks, they help prevent them.  Probable Alzheimer's dementia without behavioral disturbance Stable overall. Continue donepezil.    Time spent during visit with patient: 17 minutes

## 2018-08-26 NOTE — Assessment & Plan Note (Signed)
Stable overall. Continue donepezil.

## 2018-08-26 NOTE — Assessment & Plan Note (Signed)
Add back lipitor given ischemic CVA. rx sent in. Educated husband that statins do not cause strokes and heart attacks, they help prevent them.

## 2018-08-26 NOTE — Assessment & Plan Note (Signed)
Sugars well controlled by history. Continue current dose of lantus. Follow up in office in 3 months (or can do virtual office visit if COVID pandemic still of concern at that time).

## 2018-09-22 ENCOUNTER — Other Ambulatory Visit: Payer: Self-pay | Admitting: Family Medicine

## 2018-10-09 ENCOUNTER — Other Ambulatory Visit: Payer: Self-pay | Admitting: Family Medicine

## 2018-10-18 ENCOUNTER — Other Ambulatory Visit: Payer: Self-pay | Admitting: Family Medicine

## 2018-11-15 ENCOUNTER — Encounter: Payer: Self-pay | Admitting: Neurology

## 2018-11-15 ENCOUNTER — Other Ambulatory Visit: Payer: Self-pay

## 2018-11-15 ENCOUNTER — Ambulatory Visit (INDEPENDENT_AMBULATORY_CARE_PROVIDER_SITE_OTHER): Payer: Medicare Other | Admitting: Neurology

## 2018-11-15 VITALS — BP 156/74 | HR 52 | Temp 98.0°F | Wt 214.3 lb

## 2018-11-15 DIAGNOSIS — G309 Alzheimer's disease, unspecified: Secondary | ICD-10-CM | POA: Diagnosis not present

## 2018-11-15 DIAGNOSIS — F0280 Dementia in other diseases classified elsewhere without behavioral disturbance: Secondary | ICD-10-CM

## 2018-11-15 DIAGNOSIS — R569 Unspecified convulsions: Secondary | ICD-10-CM

## 2018-11-15 MED ORDER — LEVETIRACETAM 250 MG PO TABS: 250.0000 mg | ORAL_TABLET | Freq: Two times a day (BID) | ORAL | 1 refills | Status: DC

## 2018-11-15 NOTE — Progress Notes (Signed)
Reason for visit: Seizure, memory disturbance  Paula Reed is an 73 y.o. female  History of present illness:  Paula Reed is a 72 year old right-handed black female with a history of diabetes and dementia.  The patient suffered a seizure event on 05 June 2017 in the setting of problems with her blood sugars being elevated in the 500 range.  The patient was placed on Keppra, she has done well on a 500 mg twice daily dose since that time.  She has not had any recurrent seizures.  The patient is on low-dose Aricept for her memory issues.  Her husband who is with her today indicates that her memory has been relatively stable over time.  The patient does require some assistance with keeping up with medications and appointments.  She does not operate a motor vehicle.  The patient claims that she sleeps well at night, she has a good energy level during the day.  Past Medical History:  Diagnosis Date  . Allergic rhinitis   . Diabetes ([place])   . Diabetes mellitus without complication ([place])   . Hemorrhoids   . Hyperlipidemia   . Hypertension   . Probable Alzheimer's dementia without behavioral disturbance 09/15/2015   May 2017 [facility] score 8/30   . Seizures ([place])     Past Surgical History:  Procedure Laterality Date  . carpel tunnel release    . ECTOPIC PREGNANCY SURGERY    . TEE WITHOUT CARDIOVERSION N/A 06/07/2018   Procedure: TRANSESOPHAGEAL ECHOCARDIOGRAM (TEE);  Surgeon: Lelon Perla, MD;  Location: [facility] ENDOSCOPY;  Service: Cardiovascular;  Laterality: N/A;    Family History  Problem Relation Age of Onset  . Heart disease Father   . Hypertension Father   . Hypertension Mother   . Colon cancer Other        aunt    Social history:  reports that she has quit smoking. She has never used smokeless tobacco. She reports that she does not drink alcohol or use drugs.    Allergies  Allergen Reactions  . Chlorthalidone Rash    Per [facility] records, pt may have had a rash  rxn to chlorthalidone    Medications:  Prior to Admission medications   Medication Sig Start Date End Date Taking? Authorizing Provider  atorvastatin (LIPITOR) 80 MG tablet Take 80 mg by mouth daily.   Yes [provider]  Blood Glucose Monitoring Suppl (ONETOUCH VERIO) w/Device KIT 1 kit by Does not apply route daily. Check sugar twice daily 06/25/17  Yes Leeanne Rio, MD  cetirizine (ZYRTEC) 5 MG tablet Take 1 tablet (5 mg total) by mouth daily. 08/26/18  Yes Leeanne Rio, MD  donepezil (ARICEPT) 5 MG tablet TAKE 1 TABLET (5 MG TOTAL) AT BEDTIME BY MOUTH. 10/17/18  Yes Leeanne Rio, MD  glucose blood [facility] VERIO) test strip Check sugar twice daily 06/25/17  Yes Leeanne Rio, MD  insulin glargine (LANTUS) 100 UNIT/ML injection INJECT 18 UNITS INTO THE SKIN AT BEDTIME. 08/26/18  Yes Leeanne Rio, MD  Insulin Syringe-Needle U-100 (BD INSULIN SYRINGE U/F) 31G X 5/16" 0.3 ML MISC USE TO INJECT LANTUS DAILY 09/22/18  Yes Leeanne Rio, MD  Lancet Devices (ONE TOUCH DELICA LANCING DEV) MISC Check sugar twice daily 06/25/17  Yes Leeanne Rio, MD  levETIRAcetam (KEPPRA) 500 MG tablet TAKE 1 TABLET BY MOUTH TWICE A DAY 06/08/18  Yes Leeanne Rio, MD  metoprolol tartrate (LOPRESSOR) 25 MG tablet TAKE 1 TABLET BY  MOUTH TWICE A DAY 10/21/18  Yes Leeanne Rio, MD  [facility] DELICA LANCETS 54H MISC Check sugar twice daily 06/25/17  Yes Leeanne Rio, MD  triamcinolone ointment (KENALOG) 0.5 % Apply 1 application topically 2 (two) times daily. Apply to area on back. 03/01/18  Yes Enid Derry, [place], DO    ROS:  Out of a complete 14 system review of symptoms, the patient complains only of the following symptoms, and all other reviewed systems are negative.  Memory troubles Skin rash, itching  Blood pressure (!) 156/74, pulse (!) 52, temperature 98 F (36.7 C), temperature source Temporal, weight 214 lb 5 oz (97.2 kg).  Physical Exam   General: The patient is alert and cooperative at the time of the examination.  Skin: No significant peripheral edema is noted.   Neurologic Exam  Mental status: The patient is alert and oriented x 3 at the time of the examination. The Mini-Mental status examination done today shows a total score of 15/30.   Cranial nerves: Facial symmetry is present. Speech is normal, no aphasia or dysarthria is noted. Extraocular movements are full. Visual fields are full.  Motor: The patient has good strength in all 4 extremities.  Sensory examination: Soft touch sensation is symmetric on the face, arms, and legs.  Coordination: The patient has good finger-nose-finger and heel-to-shin bilaterally.  Gait and station: The patient has a normal gait. Romberg is negative. No drift is seen.  Reflexes: Deep tendon reflexes are symmetric.   Assessment/Plan:  1.  History of memory disturbance, dementia  2.  History of seizure, likely symptomatic seizure  The patient had a seizure event that occurred around the time of metabolic disarray with a blood sugar in the 500 range.  It is possible that the patient may be able to come off of the [place] at this point.  She will be reduced to 250 mg twice daily.  After 8 weeks, if she does well, they will stop the medication at that point.  If she does have a recurrent seizure episode, they are to contact our office.  She will follow-up in 6 months.  She will remain on the low-dose Aricept.  Jill Alexanders MD 11/15/2018 2:42 PM  [facility] [address] [place], [place] [address]  Phone [phone] Fax [phone]

## 2018-11-15 NOTE — Patient Instructions (Signed)
Lower the Keppra dose to 250 mg twice a day for 2 months, and if doing well, we will stop the medication, call if any further seizure events are noted.

## 2018-12-07 ENCOUNTER — Other Ambulatory Visit: Payer: Self-pay | Admitting: Neurology

## 2019-01-04 ENCOUNTER — Other Ambulatory Visit: Payer: Self-pay | Admitting: Neurology

## 2019-01-19 ENCOUNTER — Other Ambulatory Visit: Payer: Self-pay | Admitting: Neurology

## 2019-01-20 ENCOUNTER — Telehealth: Payer: Self-pay | Admitting: Neurology

## 2019-01-20 NOTE — Telephone Encounter (Signed)
Pt's caretaker called with the husband on the line stating the pt is out of her levETIRAcetam (KEPPRA) 250 MG tablet and he is wanting to know if she is to continue to take this mediation due to the bottle showing there are no refills. Please advise.

## 2019-01-20 NOTE — Telephone Encounter (Signed)
Patient paged the on call doctor. Dr. Jannifer Franklin' note states "The patient had a seizure event that occurred around the time of metabolic disarray with a blood sugar in the 500 range.  It is possible that the patient may be able to come off of the [place] at this point.  She will be reduced to 250 mg twice daily.  After 8 weeks, if she does well, they will stop the medication at that point. ". It has been 8 weeks and she is doing well so I told her to stop as instructed. thanks

## 2019-01-24 ENCOUNTER — Other Ambulatory Visit: Payer: Self-pay | Admitting: Family Medicine

## 2019-01-30 ENCOUNTER — Encounter: Payer: Self-pay | Admitting: Family Medicine

## 2019-01-30 ENCOUNTER — Ambulatory Visit (INDEPENDENT_AMBULATORY_CARE_PROVIDER_SITE_OTHER): Payer: Medicare Other | Admitting: Family Medicine

## 2019-01-30 ENCOUNTER — Other Ambulatory Visit: Payer: Self-pay

## 2019-01-30 VITALS — BP 150/82 | HR 91

## 2019-01-30 DIAGNOSIS — Z1211 Encounter for screening for malignant neoplasm of colon: Secondary | ICD-10-CM

## 2019-01-30 DIAGNOSIS — R21 Rash and other nonspecific skin eruption: Secondary | ICD-10-CM

## 2019-01-30 DIAGNOSIS — Z794 Long term (current) use of insulin: Secondary | ICD-10-CM | POA: Diagnosis not present

## 2019-01-30 DIAGNOSIS — R569 Unspecified convulsions: Secondary | ICD-10-CM

## 2019-01-30 DIAGNOSIS — E785 Hyperlipidemia, unspecified: Secondary | ICD-10-CM

## 2019-01-30 DIAGNOSIS — Z1231 Encounter for screening mammogram for malignant neoplasm of breast: Secondary | ICD-10-CM | POA: Diagnosis not present

## 2019-01-30 DIAGNOSIS — E1149 Type 2 diabetes mellitus with other diabetic neurological complication: Secondary | ICD-10-CM | POA: Diagnosis not present

## 2019-01-30 DIAGNOSIS — F0280 Dementia in other diseases classified elsewhere without behavioral disturbance: Secondary | ICD-10-CM

## 2019-01-30 DIAGNOSIS — G309 Alzheimer's disease, unspecified: Secondary | ICD-10-CM | POA: Diagnosis not present

## 2019-01-30 LAB — POCT GLYCOSYLATED HEMOGLOBIN (HGB A1C): HbA1c, POC (controlled diabetic range): 8.1 % — AB (ref 0.0–7.0)

## 2019-01-30 MED ORDER — BACLOFEN 10 MG PO TABS: 5.0000 mg | ORAL_TABLET | Freq: Two times a day (BID) | ORAL | 0 refills | Status: DC | PRN

## 2019-01-30 MED ORDER — OMEPRAZOLE 20 MG PO CPDR: 20.0000 mg | DELAYED_RELEASE_CAPSULE | Freq: Every day | ORAL | 1 refills | Status: DC

## 2019-01-30 MED ORDER — ONETOUCH VERIO W/DEVICE KIT: 1.0000 | PACK | Freq: Every day | 0 refills | Status: DC

## 2019-01-30 MED ORDER — TRIAMCINOLONE ACETONIDE 0.5 % EX OINT: 1.0000 "application " | TOPICAL_OINTMENT | Freq: Two times a day (BID) | CUTANEOUS | 2 refills | Status: DC | PRN

## 2019-01-30 NOTE — Assessment & Plan Note (Signed)
No longer on keppra, per neuro.

## 2019-01-30 NOTE — Assessment & Plan Note (Signed)
Now compliant with statin

## 2019-01-30 NOTE — Progress Notes (Signed)
Date of Visit: 01/30/2019   HPI:  Patient presents for follow up, accompanied by her husband.  Diabetes - currently taking lantus 18 units daily. Has not checked sugars lately. No signs of low sugars. Husband does not know how to check her sugars because machine intermittently works.  Cough - has had cough for over 1 month. No runny nose, fever, sore throat, or shortness of breath. Cough is nonproductive. Mostly happens if she gets hot or when laying down at night. Tried taking mucinex which helped a bit, then it returned. No sick contacts.  Back pain -Has had pain in R upper back for about a month, in paraspinal muscle area.  has tried heating pad which helps some, as does tylenol. No trouble with stooling or urinating. Would be interested in trying a muscle relaxer  Hypertension - previously taking metoprolol 25mg  twice daily but it seems she stopped takign this after a home health nurse advised she stop it due to low blood pressures.  Memory - no wandering or safety concerns. Taking aricept 5mg  daily. Husband feels memory is overall stable.  Skin issues - needs triamcinolone refilled. Has unknown chronic skin condition which I have previously biopsied and sent to dermatology. Husband reports she's doing well on triamcinolone and just needs refill. It works well for her.  Seizures - was taken off keppra by neurology. Doing well without it.  ROS: See HPI.  [place]: history of type 2 diabetes, hypertension, prior stroke, alzheimers, hyperlipidemia, MRSA bacteremia, seizures  PHYSICAL EXAM: BP (!) 158/82   Pulse 91   SpO2 97%   Recheck 150/82 Gen: no acute distress, pleasant, cooperative HEENT: normocephalic, atraumatic  Heart: regular rate and rhythm, no murmur Lungs: clear to auscultation bilaterally, normal work of breathing  Neuro: alert, speech normal Ext: No appreciable lower extremity edema bilaterally  Back: tender to palpation of paraspinous musculature in R upper back. No  midline bony tenderness.  ASSESSMENT/PLAN:  Health maintenance:  -discussed with patient and husband whether she is interested in continuing with cancer screenings. She says she would like to continue with screenings and would be willing to get chemo, radiation, or surgery if a cancer were detected. -refer to GI for colonoscopy -mammogram ordered -will request record from eye exam - [facility]  Probable Alzheimer's dementia without behavioral disturbance Overall stable. Continue aricept.  Diabetes mellitus, type II ([place]) Less well controlled with A1c of 8.1, though hesitant to make any changes to insulin as patient has not been checking sugars regularly at home. No symptoms concerning for hypoglycemia. In light of dementia, I am ok with less stringent glucose control. - Sent in new meter which will hopefully help.  - Asked husband to schedule appointment in pharmacy clinic here at the [facility] if he needs help learning how to check her sugars.  - Follow up in 3 months for next A1c.  Hyperlipidemia Now compliant with statin  Seizures ([place]) No longer on keppra, per neuro.  Rash and nonspecific skin eruption Overall stable. Refill triamcinolone.  Back pain Seems muscle spasm. rx low dose baclofen, counseled husband on risks of sedation  Cough Suspect GERD given nighttime predominance, dry cough, lack of infectious symptoms or known exposures. Trial of omeprazole 20mg  daily.  FOLLOW UP: Follow up in 3 mos for above issues Referring to GI   [place] J. Ardelia Mems, [place]

## 2019-01-30 NOTE — Assessment & Plan Note (Signed)
Overall stable. Continue aricept.

## 2019-01-30 NOTE — Assessment & Plan Note (Signed)
Less well controlled with A1c of 8.1, though hesitant to make any changes to insulin as patient has not been checking sugars regularly at home. No symptoms concerning for hypoglycemia. In light of dementia, I am ok with less stringent glucose control. - Sent in new meter which will hopefully help.  - Asked husband to schedule appointment in pharmacy clinic here at the [facility] if he needs help learning how to check her sugars.  - Follow up in 3 months for next A1c.

## 2019-01-30 NOTE — Patient Instructions (Addendum)
Diabetes - sent in new meter  Cough - start omeprazole for acid reflux, see if this helps  Back pain - stay on tylenol, heating pad. Sent in baclofen, muscle relaxer for you to try  Refilled skin ointment  Referring to GI for colonoscopy See the handout on how to schedule your mammogram. This is an important test to screen for breast cancer.   Follow up with me in 3 months, sooner if needed.  Be well, Dr. Ardelia Mems

## 2019-01-30 NOTE — Assessment & Plan Note (Signed)
Overall stable. Refill triamcinolone.

## 2019-02-02 ENCOUNTER — Encounter: Payer: Self-pay | Admitting: Gastroenterology

## 2019-02-04 ENCOUNTER — Other Ambulatory Visit: Payer: Self-pay | Admitting: Neurology

## 2019-02-07 ENCOUNTER — Encounter: Payer: Self-pay | Admitting: Family Medicine

## 2019-02-07 ENCOUNTER — Other Ambulatory Visit: Payer: Self-pay

## 2019-02-07 ENCOUNTER — Ambulatory Visit (INDEPENDENT_AMBULATORY_CARE_PROVIDER_SITE_OTHER): Payer: Medicare Other | Admitting: Family Medicine

## 2019-02-07 VITALS — BP 136/80 | HR 77 | Temp 98.0°F | Wt 209.0 lb

## 2019-02-07 DIAGNOSIS — R21 Rash and other nonspecific skin eruption: Secondary | ICD-10-CM | POA: Diagnosis not present

## 2019-02-07 MED ORDER — DOXYCYCLINE HYCLATE 100 MG PO TABS: 100.0000 mg | ORAL_TABLET | Freq: Two times a day (BID) | ORAL | 0 refills | Status: AC

## 2019-02-07 MED ORDER — MUPIROCIN 2 % EX OINT: 1.0000 "application " | TOPICAL_OINTMENT | Freq: Two times a day (BID) | CUTANEOUS | 0 refills | Status: DC

## 2019-02-07 NOTE — Progress Notes (Signed)
  Subjective:   Patient ID: LARENDA REEDY    DOB: [birth year], 73 y.o. female   MRN: [id number]  TIFFIANY BEADLES is a 73 y.o. female with a history of HTN, prior stroke, T2 DM, probable Alzheimer's dementia, HLD, h/o MRSA bacteremia, seizures here for   Bump on cheek Endorses whitehead on cheek since this morning. It does not bother her. Not painful or itchy. She has not been picking at it. Denies drainage. Wearing her mask. Denies contacts with similar rash or whitehead. Denies fevers, SOB. Eating and drinking ok.   Review of Systems:  Per HPI.  Medications and smoking status reviewed.  Objective:   BP 136/80   Pulse 77   Temp 98 F (36.7 C) (Oral)   Wt 209 lb (94.8 kg)   SpO2 96%   BMI 31.78 kg/m  Vitals and nursing note reviewed.  General: well nourished, well developed, in no acute distress with non-toxic appearance HEENT: normocephalic, atraumatic, moist mucous membranes Skin: warm, dry. ~1cm well circumscribed indurated erythematous area with purulent head. Extremities: warm and well perfused, normal tone MSK: ROM grossly intact, gait normal Neuro: Alert and oriented, speech normal      Assessment & Plan:   Rash and nonspecific skin eruption Purulent and well circumscribed ~1cm area, non painful to palpation and without systemic symptoms. Vitals wnl. Lanced purulent head on exam with slight amount of frank pus. Given quick onset and purulence with prior h/o MRSA bacteremia, will provide PO doxycycline x7 days with topical mupirocin. Does have h/o diabetes as well with recent A1c 8.1 last week so could also inhibit healing. Will have patient f/u on Friday for recheck with strict return precautions to RTC if systemic symptoms develop or induration/purulence worsens, see AVS for details.  No orders of the defined types were placed in this encounter.  Meds ordered this encounter  Medications  . mupirocin ointment (BACTROBAN) 2 %    Sig: Apply 1 application topically 2  (two) times daily.    Dispense:  22 g    Refill:  0  . doxycycline (VIBRA-TABS) 100 MG tablet    Sig: Take 1 tablet (100 mg total) by mouth 2 (two) times daily for 7 days.    Dispense:  14 tablet    Refill:  0    Rory Percy, DO PGY-3, [facility] 02/07/2019 4:10 PM

## 2019-02-07 NOTE — Patient Instructions (Signed)
It was great to see you!  Our plans for today:  - Take the antibiotic twice daily for 7 days. - Come back on Friday for a recheck. - If you develop fevers, nausea, vomiting, or worsening of the area, come back to see [place] or go to the ED if it is after hours.  Take care and seek immediate care sooner if you develop any concerns.   Dr. [facility]

## 2019-02-07 NOTE — Assessment & Plan Note (Addendum)
Purulent and well circumscribed ~1cm area, non painful to palpation and without systemic symptoms. Vitals wnl. Lanced purulent head on exam with slight amount of frank pus. Given quick onset and purulence with prior h/o MRSA bacteremia, will provide PO doxycycline x7 days with topical mupirocin. Does have h/o diabetes as well with recent A1c 8.1 last week so could also inhibit healing. Will have patient f/u on Friday for recheck with strict return precautions to RTC if systemic symptoms develop or induration/purulence worsens, see AVS for details.

## 2019-02-10 ENCOUNTER — Ambulatory Visit (INDEPENDENT_AMBULATORY_CARE_PROVIDER_SITE_OTHER): Payer: Medicare Other | Admitting: Student in an Organized Health Care Education/Training Program

## 2019-02-10 ENCOUNTER — Other Ambulatory Visit: Payer: Self-pay

## 2019-02-10 DIAGNOSIS — R21 Rash and other nonspecific skin eruption: Secondary | ICD-10-CM

## 2019-02-10 DIAGNOSIS — Z8709 Personal history of other diseases of the respiratory system: Secondary | ICD-10-CM | POA: Diagnosis not present

## 2019-02-10 DIAGNOSIS — Z87898 Personal history of other specified conditions: Secondary | ICD-10-CM | POA: Insufficient documentation

## 2019-02-10 MED ORDER — BENZONATATE 100 MG PO CAPS: 100.0000 mg | ORAL_CAPSULE | Freq: Three times a day (TID) | ORAL | 0 refills | Status: AC | PRN

## 2019-02-10 NOTE — Assessment & Plan Note (Signed)
Please continue to take your antibiotic as prescribed until complete. Please return to emergency department or call our clinic if you have return of the original lesion or similar. No need to follow-up unless worsening.

## 2019-02-10 NOTE — Progress Notes (Signed)
   Subjective:    Patient ID: Paula Reed, female    DOB: [birth year], 73 y.o.   MRN: [id number]  CC: Follow-up left cheek lesion  HPI:  Patient has been compliant with prescribed antibiotics.  The lesion has no longer any purulent drainage and erythema has essentially resolved.  Paula Reed denies any fever, pain, bleeding, swelling or other feeling of unwell.  Her spouse is with her today and expresses concern that Paula Reed has a chronic dry cough which was not improved with cetirizine.  Patient is due for mammogram and spouse discussed the location for this exam is they are planning on obtaining soon.  Smoking status reviewed   ROS: pertinent noted in the HPI   I have personally reviewed pertinent past medical history, surgical, family, and social history as appropriate.  Objective:  BP 130/80   Pulse 82   Wt 211 lb 6.4 oz (95.9 kg)   SpO2 98%   BMI 32.14 kg/m   Vitals and nursing note reviewed  General: NAD, pleasant, able to participate in exam Skin: warm and dry, healing lesion on left cheek.  (Please see clinical image for further detail) Lungs: Clear to auscultation bilaterally Neuro: alert, no obvious focal deficits Psych: Normal affect and mood    Assessment & Plan:   Rash and nonspecific skin eruption Please continue to take your antibiotic as prescribed until complete. Please return to emergency department or call our clinic if you have return of the original lesion or similar. No need to follow-up unless worsening.  History of chronic cough Not improved with cetirizine. No cough during office visit and lungs sound clear. Prescribed Tessalon Perles.  Meds ordered this encounter  Medications  . benzonatate (TESSALON) 100 MG capsule    Sig: Take 1 capsule (100 mg total) by mouth 3 (three) times daily as needed for cough.    Dispense:  60 capsule    Refill:  0    I independently examined pertinent imaging in relation to problem.  Doristine Mango, [facility] PGY-2

## 2019-02-10 NOTE — Assessment & Plan Note (Signed)
Not improved with cetirizine. No cough during office visit and lungs sound clear. Prescribed Tessalon Perles.

## 2019-02-10 NOTE — Patient Instructions (Signed)
It was a pleasure to see you today!  To summarize our discussion for this visit:  The sore on your cheek looks so much better.  Please continue to take the antibiotics until it is completed.  I sent in a prescription for Tessalon Perles to help with cough.  Please return to our office if there is any new developments of this lesion on the cheek, otherwise I do not feel that you need to follow-up for this issue.  Some additional health maintenance measures we should update are: Health Maintenance Due  Topic Date Due  . COLONOSCOPY  03/23/1997  . OPHTHALMOLOGY EXAM  01/07/2018  . MAMMOGRAM  06/16/2018  . FOOT EXAM  06/26/2018  .    Call the clinic at [phone] if your symptoms worsen or you have any concerns.   Thank you for allowing me to take part in your care,  Dr. Doristine Mango

## 2019-02-14 ENCOUNTER — Other Ambulatory Visit: Payer: Self-pay | Admitting: Family Medicine

## 2019-02-21 ENCOUNTER — Telehealth: Payer: Self-pay | Admitting: *Deleted

## 2019-02-21 ENCOUNTER — Encounter: Payer: Medicare Other | Admitting: *Deleted

## 2019-02-21 NOTE — Progress Notes (Signed)
Appointment canceled for no-show. Letter sent.

## 2019-02-21 NOTE — Telephone Encounter (Signed)
Patient no show for pre-visit.  Unable to contact via phone, no voicemail.  Letter sent advising of missed appointment and cancelled colonoscopy.

## 2019-02-25 ENCOUNTER — Other Ambulatory Visit: Payer: Self-pay | Admitting: Family Medicine

## 2019-03-07 ENCOUNTER — Encounter: Payer: Medicare Other | Admitting: Gastroenterology

## 2019-03-24 ENCOUNTER — Other Ambulatory Visit: Payer: Self-pay | Admitting: Family Medicine

## 2019-03-30 ENCOUNTER — Encounter: Payer: Self-pay | Admitting: Family Medicine

## 2019-03-30 LAB — HM DIABETES EYE EXAM

## 2019-04-11 ENCOUNTER — Other Ambulatory Visit: Payer: Self-pay | Admitting: Family Medicine

## 2019-04-14 ENCOUNTER — Other Ambulatory Visit: Payer: Self-pay | Admitting: Family Medicine

## 2019-04-25 ENCOUNTER — Other Ambulatory Visit: Payer: Self-pay | Admitting: Family Medicine

## 2019-04-29 ENCOUNTER — Other Ambulatory Visit: Payer: Self-pay | Admitting: Family Medicine

## 2019-05-09 ENCOUNTER — Other Ambulatory Visit: Payer: Self-pay | Admitting: *Deleted

## 2019-05-14 ENCOUNTER — Other Ambulatory Visit: Payer: Self-pay | Admitting: Family Medicine

## 2019-05-24 ENCOUNTER — Ambulatory Visit (INDEPENDENT_AMBULATORY_CARE_PROVIDER_SITE_OTHER): Payer: Medicare Other | Admitting: Neurology

## 2019-05-24 ENCOUNTER — Encounter: Payer: Self-pay | Admitting: Neurology

## 2019-05-24 ENCOUNTER — Other Ambulatory Visit: Payer: Self-pay

## 2019-05-24 VITALS — BP 166/82 | HR 68 | Temp 97.4°F | Ht 68.0 in | Wt 202.0 lb

## 2019-05-24 DIAGNOSIS — G309 Alzheimer's disease, unspecified: Secondary | ICD-10-CM

## 2019-05-24 DIAGNOSIS — F0280 Dementia in other diseases classified elsewhere without behavioral disturbance: Secondary | ICD-10-CM | POA: Diagnosis not present

## 2019-05-24 NOTE — Progress Notes (Signed)
PATIENT: SUZZANNE BRUNKHORST DOB: 05/26/1945  REASON FOR VISIT: follow up HISTORY FROM: patient  HISTORY OF PRESENT ILLNESS: Today 05/24/19  Ms. Marmo is a 74 year old female with history of diabetes and dementia.  She remains on Aricept.  She has previously been placed on Keppra as result of seizure in the setting of hypoglycemia.  After last visit, her dose of Keppra was reduced, and she eventually stopped the medication. Her husband feels her memory is stable, is good for her condition.  Feels that if she concentrates it is okay.  She requires assistance with her medications and appointments.  She does her own daily activities, she does not drive a car.  Neither of them do much cooking.  She sleeps well.  In the last 6 months, she has lost about 12 pounds.  Her blood sugars are doing well, has not had recurrent seizure.  She presents today for evaluation accompanied by her husband. She is taking low dose aricept 5 mg daily.   HISTORY 11/15/2018 Dr. Jannifer Franklin: Ms. Ammon is a 74 year old right-handed black female with a history of diabetes and dementia.  The patient suffered a seizure event on 05 June 2017 in the setting of problems with her blood sugars being elevated in the 500 range.  The patient was placed on Keppra, she has done well on a 500 mg twice daily dose since that time.  She has not had any recurrent seizures.  The patient is on low-dose Aricept for her memory issues.  Her husband who is with her today indicates that her memory has been relatively stable over time.  The patient does require some assistance with keeping up with medications and appointments.  She does not operate a motor vehicle.  The patient claims that she sleeps well at night, she has a good energy level during the day  REVIEW OF SYSTEMS: Out of a complete 14 system review of symptoms, the patient complains only of the following symptoms, and all other reviewed systems are negative.  Memory loss  ALLERGIES:  Allergies  Allergen Reactions  . Chlorthalidone Rash    Per [facility] records, pt may have had a rash rxn to chlorthalidone    HOME MEDICATIONS: Outpatient Medications Prior to Visit  Medication Sig Dispense Refill  . aspirin EC 81 MG tablet Take 81 mg by mouth daily.    [name] atorvastatin (LIPITOR) 80 MG tablet TAKE 1 TABLET (80 MG TOTAL) BY MOUTH DAILY AT 6 PM. 90 tablet 1  . Blood Glucose Monitoring Suppl (ONETOUCH VERIO) w/Device KIT 1 kit by Does not apply route daily. Check sugar twice daily 1 kit 0  . donepezil (ARICEPT) 5 MG tablet TAKE 1 TABLET (5 MG TOTAL) AT BEDTIME BY MOUTH. 90 tablet 1  . glucose blood (ONETOUCH VERIO) test strip Check sugar twice daily 200 each 11  . insulin glargine (LANTUS) 100 UNIT/ML injection INJECT 18 UNITS INTO THE SKIN AT BEDTIME. 10 mL 2  . Insulin Syringe-Needle U-100 (BD INSULIN SYRINGE U/F) 31G X 5/16" 0.3 ML MISC USE TO INJECT LANTUS DAILY 100 each 5  . Lancet Devices (ONE TOUCH DELICA LANCING DEV) MISC Check sugar twice daily 1 each 0  . mupirocin ointment (BACTROBAN) 2 % Apply 1 application topically 2 (two) times daily. 22 g 0  . omeprazole (PRILOSEC) 20 MG capsule TAKE 1 CAPSULE BY MOUTH EVERY DAY 30 capsule 1  . ONETOUCH DELICA LANCETS 84O MISC Check sugar twice daily 200 each 11  . triamcinolone ointment (KENALOG)  0.5 % Apply 1 application topically 2 (two) times daily as needed. 30 g 2  . baclofen (LIORESAL) 10 MG tablet TAKE 0.5 TABLETS (5 MG TOTAL) BY MOUTH 2 (TWO) TIMES DAILY AS NEEDED FOR MUSCLE SPASMS. (Patient not taking: Reported on 05/24/2019) 15 tablet 0   No facility-administered medications prior to visit.    PAST MEDICAL HISTORY: Past Medical History:  Diagnosis Date  . Allergic rhinitis   . Diabetes ([place])   . Diabetes mellitus without complication ([place])   . Hemorrhoids   . Hyperlipidemia   . Hypertension   . Probable Alzheimer's dementia without behavioral disturbance 09/15/2015   May 2017 [facility] score 8/30   . Seizures  ([place])     PAST SURGICAL HISTORY: Past Surgical History:  Procedure Laterality Date  . carpel tunnel release    . ECTOPIC PREGNANCY SURGERY    . TEE WITHOUT CARDIOVERSION N/A 06/07/2018   Procedure: TRANSESOPHAGEAL ECHOCARDIOGRAM (TEE);  Surgeon: Lelon Perla, MD;  Location: [facility] ENDOSCOPY;  Service: Cardiovascular;  Laterality: N/A;    FAMILY HISTORY: Family History  Problem Relation Age of Onset  . Heart disease Father   . Hypertension Father   . Hypertension Mother   . Colon cancer Other        aunt    SOCIAL HISTORY: Social History   Socioeconomic History  . Marital status: Married    Spouse name: Eduard Clos  . Number of children: 5  . Years of education: 68  . Highest education level: Not on file  Occupational History  . Occupation: Retired- Scientist, clinical (histocompatibility and immunogenetics): UNEMPLOYED  Tobacco Use  . Smoking status: Former Research scientist (life sciences)  . Smokeless tobacco: Never Used  Substance and Sexual Activity  . Alcohol use: No  . Drug use: No  . Sexual activity: Not on file  Other Topics Concern  . Not on file  Social History Narrative    Merged History Encounter        Feels safe in relationship. Negative depression screen on 02/02/12.       Health Care POA:    Emergency Contact: husband, Eduard Clos [phone]   End of Life Plan: gave AD information 4/15   Who lives with you: husband and 3 foster girls ages 86, 67, 46(07/2013)   Any pets: none   Diet: Pt has a varied diet of protein, starch and vegetables.   Exercise: Pt does not have regular exercise routine.    Seatbelts: Pt reports wearing seatbelt when in vehicles.    Hobbies: walking, eating out   Caffeine use: 1 cup coffee every morning   Diet-cola sometimes   Right handed   Social Determinants of Health   Financial Resource Strain:   . Difficulty of Paying Living Expenses: Not on file  Food Insecurity:   . Worried About Charity fundraiser in the Last Year: Not on file  . Ran Out of Food in the Last Year: Not on file   Transportation Needs:   . Lack of Transportation (Medical): Not on file  . Lack of Transportation (Non-Medical): Not on file  Physical Activity:   . Days of Exercise per Week: Not on file  . Minutes of Exercise per Session: Not on file  Stress:   . Feeling of Stress : Not on file  Social Connections:   . Frequency of Communication with Friends and Family: Not on file  . Frequency of Social Gatherings with Friends and Family: Not on file  . Attends Religious Services: Not on  file  . Active Member of Clubs or Organizations: Not on file  . Attends Archivist Meetings: Not on file  . Marital Status: Not on file  Intimate Partner Violence:   . Fear of Current or Ex-Partner: Not on file  . Emotionally Abused: Not on file  . Physically Abused: Not on file  . Sexually Abused: Not on file   PHYSICAL EXAM  Vitals:   05/24/19 1430  BP: (!) 166/82  Pulse: 68  Temp: (!) 97.4 F (36.3 C)  SpO2: 96%  Weight: 202 lb (91.6 kg)  Height: 5' 8"  (1.727 m)   Body mass index is 30.71 kg/m.  Generalized: Well developed, in no acute distress  MMSE - Mini Mental State Exam 05/24/2019 11/15/2018 08/17/2013  Orientation to time 0 1 5  Orientation to Place 3 2 5   Registration 3 3 3   Attention/ Calculation 1 2 5   Recall 0 0 1  Language- name 2 objects 2 2 2   Language- repeat 1 0 1  Language- follow 3 step command 3 3 3   Language- read & follow direction 1 1 1   Write a sentence 1 1 1   Copy design 0 0 1  Total score 15 15 28     Neurological examination  Mentation: Alert, most of history is provided by husband.  Follows all commands speech and language fluent Cranial nerve II-XII: Pupils were equal round reactive to light. Extraocular movements were full, visual field were full on confrontational test. Facial sensation and strength were normal.  Head turning and shoulder shrug were normal and symmetric. Motor: Good strength of all extremities Sensory: Sensory testing is intact to soft  touch on all 4 extremities. No evidence of extinction is noted.  Coordination: Cerebellar testing reveals good finger-nose-finger and heel-to-shin bilaterally.  Gait and station: Gait is normal.  Reflexes: Deep tendon reflexes are symmetric  DIAGNOSTIC DATA (LABS, IMAGING, TESTING) - I reviewed patient records, labs, notes, testing and imaging myself where available.  Lab Results  Component Value Date   WBC 6.8 06/07/2018   HGB 12.5 06/07/2018   HCT 40.2 06/07/2018   MCV 91.2 06/07/2018   PLT 285 06/07/2018      Component Value Date/Time   NA 133 (L) 06/07/2018 0322   NA 134 06/11/2016 1611   K 3.9 06/07/2018 0322   CL 100 06/07/2018 0322   CO2 24 06/07/2018 0322   GLUCOSE 163 (H) 06/07/2018 0322   BUN 13 06/07/2018 0322   BUN 13 06/11/2016 1611   CREATININE 0.58 06/08/2018 0327   CREATININE 0.62 09/12/2015 1025   CALCIUM 8.6 (L) 06/07/2018 0322   PROT 7.1 06/07/2018 0322   PROT 7.5 06/04/2016 1125   ALBUMIN 3.4 (L) 06/07/2018 0322   ALBUMIN 4.1 06/04/2016 1125   AST 33 06/07/2018 0322   ALT 19 06/07/2018 0322   ALKPHOS 67 06/07/2018 0322   BILITOT 0.8 06/07/2018 0322   BILITOT 0.7 06/04/2016 1125   GFRNONAA >60 06/08/2018 0327   GFRNONAA >89 09/12/2015 1025   GFRAA >60 06/08/2018 0327   GFRAA >89 09/12/2015 1025   Lab Results  Component Value Date   CHOL 140 06/03/2018   HDL <10 (L) 06/03/2018   LDLCALC NOT CALCULATED 06/03/2018   TRIG 363 (H) 06/03/2018   CHOLHDL NOT CALCULATED 06/03/2018   Lab Results  Component Value Date   HGBA1C 8.1 (A) 01/30/2019   Lab Results  Component Value Date   VITAMINB12 285 09/12/2015   Lab Results  Component Value Date  TSH 1.89 09/12/2015      ASSESSMENT AND PLAN 74 y.o. year old female  has a past medical history of Allergic rhinitis, Diabetes ([place]), Diabetes mellitus without complication ([place]), Hemorrhoids, Hyperlipidemia, Hypertension, Probable Alzheimer's dementia without behavioral disturbance (09/15/2015), and  Seizures ([place]). here with:  1. History of memory disturbance, dementia 2.  History of seizure, likely symptomatic seizure  She has remained off Keppra, and has not had recurrent seizure.  She will remain on low-dose Aricept 5 mg daily, we likely will not increase the dose given history of seizure.  Her memory score is stable 15/30, I suggested the addition of Namenda, I have given the husband some information to review. She will follow-up in 6 months or sooner if needed.  I did advise if her symptoms worsen or she develops any new symptoms she should let us know.   I spent 15 minutes with the patient. 50% of this time was spent discussing her plan of care.   Butler Denmark, AGNP-C, DNP 05/24/2019, 2:48 PM [facility] [address] [place], [place] [address] [phone]

## 2019-05-24 NOTE — Patient Instructions (Addendum)
Let's keep taking Aricept 5 mg daily, we may consider Namenda, I will give you some information to review  Memory score is stable 15/30  Return in 6 months   Memantine Tablets What is this medicine? MEMANTINE (MEM an teen) is used to treat dementia caused by Alzheimer's disease. This medicine may be used for other purposes; ask your health care provider or pharmacist if you have questions. COMMON BRAND NAME(S): Namenda What should I tell my health care provider before I take this medicine? They need to know if you have any of these conditions:  difficulty passing urine  kidney disease  liver disease  seizures  an unusual or allergic reaction to memantine, other medicines, foods, dyes, or preservatives  pregnant or trying to get pregnant  breast-feeding How should I use this medicine? Take this medicine by mouth with a glass of water. Follow the directions on the prescription label. You may take this medicine with or without food. Take your doses at regular intervals. Do not take your medicine more often than directed. Continue to take your medicine even if you feel better. Do not stop taking except on the advice of your doctor or health care professional. Talk to your pediatrician regarding the use of this medicine in children. Special care may be needed. Overdosage: If you think you have taken too much of this medicine contact a poison control center or emergency room at once. NOTE: This medicine is only for you. Do not share this medicine with others. What if I miss a dose? If you miss a dose, take it as soon as you can. If it is almost time for your next dose, take only that dose. Do not take double or extra doses. If you do not take your medicine for several days, contact your health care provider. Your dose may need to be changed. What may interact with this  medicine?  acetazolamide  amantadine  cimetidine  dextromethorphan  dofetilide  hydrochlorothiazide  ketamine  metformin  methazolamide  quinidine  ranitidine  sodium bicarbonate  triamterene This list may not describe all possible interactions. Give your health care provider a list of all the medicines, herbs, non-prescription drugs, or dietary supplements you use. Also tell them if you smoke, drink alcohol, or use illegal drugs. Some items may interact with your medicine. What should I watch for while using this medicine? Visit your doctor or health care professional for regular checks on your progress. Check with your doctor or health care professional if there is no improvement in your symptoms or if they get worse. You may get drowsy or dizzy. Do not drive, use machinery, or do anything that needs mental alertness until you know how this drug affects you. Do not stand or sit up quickly, especially if you are an older patient. This reduces the risk of dizzy or fainting spells. Alcohol can make you more drowsy and dizzy. Avoid alcoholic drinks. What side effects may I notice from receiving this medicine? Side effects that you should report to your doctor or health care professional as soon as possible:  allergic reactions like skin rash, itching or hives, swelling of the face, lips, or tongue  agitation or a feeling of restlessness  depressed mood  dizziness  hallucinations  redness, blistering, peeling or loosening of the skin, including inside the mouth  seizures  vomiting Side effects that usually do not require medical attention (report to your doctor or health care professional if they continue or are bothersome):  constipation  diarrhea  headache  nausea  trouble sleeping This list may not describe all possible side effects. Call your doctor for medical advice about side effects. You may report side effects to FDA at 1-800-FDA-1088. Where should I  keep my medicine? Keep out of the reach of children. Store at room temperature between 15 degrees and 30 degrees C (59 degrees and 86 degrees F). Throw away any unused medicine after the expiration date. NOTE: This sheet is a summary. It may not cover all possible information. If you have questions about this medicine, talk to your doctor, pharmacist, or health care provider.  2020 Elsevier/Gold Standard (2013-01-30 14:10:42)

## 2019-05-26 NOTE — Progress Notes (Signed)
I have read the note, and I agree with the clinical assessment and plan.  Paula Reed [name]   

## 2019-06-23 ENCOUNTER — Ambulatory Visit (INDEPENDENT_AMBULATORY_CARE_PROVIDER_SITE_OTHER): Payer: Medicare Other | Admitting: Family Medicine

## 2019-06-23 ENCOUNTER — Encounter: Payer: Self-pay | Admitting: Family Medicine

## 2019-06-23 ENCOUNTER — Other Ambulatory Visit: Payer: Self-pay

## 2019-06-23 VITALS — BP 144/80 | HR 66 | Wt 201.4 lb

## 2019-06-23 DIAGNOSIS — Z8709 Personal history of other diseases of the respiratory system: Secondary | ICD-10-CM | POA: Diagnosis not present

## 2019-06-23 DIAGNOSIS — Z794 Long term (current) use of insulin: Secondary | ICD-10-CM | POA: Diagnosis not present

## 2019-06-23 DIAGNOSIS — E1149 Type 2 diabetes mellitus with other diabetic neurological complication: Secondary | ICD-10-CM

## 2019-06-23 DIAGNOSIS — Z87898 Personal history of other specified conditions: Secondary | ICD-10-CM

## 2019-06-23 LAB — POCT GLYCOSYLATED HEMOGLOBIN (HGB A1C): HbA1c, POC (controlled diabetic range): 12.7 % — AB (ref 0.0–7.0)

## 2019-06-23 NOTE — Patient Instructions (Signed)
It was great to see you again today!  Stop aricept and let's see if cough gets better  Fill out sugar chart, check every morning  Follow up with me in 1-2 weeks and bring sugar chart with you  Be well, Dr. Pollie Meyer

## 2019-06-23 NOTE — Progress Notes (Signed)
  Date of Visit: 06/23/2019   HPI:  Paula Reed presents today for follow up.  Chronic cough - having cough still despite starting omeprazole 20mg  daily. Taking over the counter cough syrup which helps some, doing this twice daily. Husband does not know name of medication. He was told by a pharmacist that donepezil may be contributing to her cough. Cough is nonproductive. No fevers or sinus drainage.  Diabetes - currently taking lantus 18u at bedtime. Has meter but has not checked sugar in the last month. No known low sugars or symptoms of low sugars. A1c today 12.7.  Health maintenance - had appointment for colonoscopy but did not attend because patient decided she didn't want to do it. She now declines breast and colon cancer screening. History of dementia. Husband is agreeable with her foregoing screening.  PHYSICAL EXAM: BP (!) 144/80   Pulse 66   Wt 201 lb 6.4 oz (91.4 kg)   SpO2 97%   BMI 30.62 kg/m  Gen: no acute distress, pleasnat, cooperative HEENT: normocephalic, atraumatic  Heart: regular rate and rhythm, no murmur Lungs: clear to auscultation bilaterally normal work of breathing  Neuro: alert speech normal Ext: No appreciable lower extremity edema bilaterally   ASSESSMENT/PLAN:  Health maintenance:  -request eye exam from [facility] -defer foot exam to next visit given time constraints today -will remove colonoscopy & mammogram from patient's health maintenance plans per patient/husband wishes  Diabetes mellitus, type II (HCC) Uncontrolled with A1c of 12.7 Unable to titrate sugars due to not having any recent sugar readings. Patient has history of prior hypoglycemia so want to be cautious. Printed blood sugar log and asked husband to check fasting sugar every daily and follow up with me in 1-2 weeks with log in hand.  History of chronic cough Persists despite PPI Husband thinks it is related to donepezil. Explained limited utility of this medication; reasonable  to do trial off it. They will hold aricept and follow up in 1-2 weeks to monitor for improvement  FOLLOW UP: Follow up in 1-2 weeks for above issues  J. [place], MD [facility]  Greater than 30 minutes were spent on this encounter on the day of service, including pre-visit planning, actual face to face time, coordination of care, and documentation of visit.

## 2019-06-23 NOTE — Assessment & Plan Note (Signed)
Persists despite PPI Husband thinks it is related to donepezil. Explained limited utility of this medication; reasonable to do trial off it. They will hold aricept and follow up in 1-2 weeks to monitor for improvement

## 2019-06-23 NOTE — Assessment & Plan Note (Signed)
Uncontrolled with A1c of 12.7 Unable to titrate sugars due to not having any recent sugar readings. Patient has history of prior hypoglycemia so want to be cautious. Printed blood sugar log and asked husband to check fasting sugar every daily and follow up with me in 1-2 weeks with log in hand.

## 2019-06-25 ENCOUNTER — Other Ambulatory Visit: Payer: Self-pay | Admitting: Family Medicine

## 2019-07-04 ENCOUNTER — Ambulatory Visit (INDEPENDENT_AMBULATORY_CARE_PROVIDER_SITE_OTHER): Payer: Medicare Other | Admitting: Family Medicine

## 2019-07-04 ENCOUNTER — Other Ambulatory Visit: Payer: Self-pay

## 2019-07-04 VITALS — BP 132/70 | HR 71 | Wt 201.6 lb

## 2019-07-04 DIAGNOSIS — E1149 Type 2 diabetes mellitus with other diabetic neurological complication: Secondary | ICD-10-CM

## 2019-07-04 DIAGNOSIS — Z8709 Personal history of other diseases of the respiratory system: Secondary | ICD-10-CM | POA: Diagnosis not present

## 2019-07-04 DIAGNOSIS — Z794 Long term (current) use of insulin: Secondary | ICD-10-CM | POA: Diagnosis not present

## 2019-07-04 DIAGNOSIS — Z87898 Personal history of other specified conditions: Secondary | ICD-10-CM

## 2019-07-04 MED ORDER — OMEPRAZOLE 20 MG PO CPDR: 20.0000 mg | DELAYED_RELEASE_CAPSULE | Freq: Two times a day (BID) | ORAL | 2 refills | Status: DC

## 2019-07-04 MED ORDER — INSULIN GLARGINE 100 UNIT/ML ~~LOC~~ SOLN: SUBCUTANEOUS | Status: DC

## 2019-07-04 NOTE — Progress Notes (Signed)
  Date of Visit: 07/04/2019   SUBJECTIVE:   HPI:  Paula Reed presents today for follow up.  Diabetes - currently taking lantus 18 units daily. Husband brought in chart of her fasting blood sugars. These are running continually in the 200s. She has not had any low sugars. Husband requests podiatry referral for assistance with foot care.   Cough - stopped aricept but cough persists. Currently on omeprazole 20mg  daily. Has gotten some cough syrup over the counter that helps some. Nonproductive cough. Ongoing for months. No fevers. Does not feel particularly congested in her nose.   OBJECTIVE:   BP 132/70   Pulse 71   Wt 201 lb 9.6 oz (91.4 kg)   SpO2 99%   BMI 30.65 kg/m  Gen: no acute distress, pleasant, cooperative HEENT: normocephalic, atraumatic  Heart: regular rate and rhythm, no murmurs Lungs: normal work of breathing, clear to auscultation bilaterally  Neuro: alert, speech normal Ext: No appreciable lower extremity edema bilaterally   ASSESSMENT/PLAN:   Health maintenance:  -refer to podiatry for diabetic foot care  Diabetes mellitus, type II (HCC) Uncontrolled based on recent A1c as well as fasting sugars. Increase lantus to 21u daily.  Will message pharmacy team and see if they are able to help husband with adjusting insulin. I don't think he will be able to follow instructions for self-titration, he'll need regular phone follow up in order to do this.  History of chronic cough Persists. Still suspect GERD. Increase omeprazole to 20mg  twice daily. No significant smoking history to raise concern for underlying COPD If no improvement with this consider CXR.  FOLLOW UP: Follow up in 1 month with me for above Will request pharmacy team follow  J. , MD [facility]

## 2019-07-04 NOTE — Patient Instructions (Signed)
It was great to see you again today!  Go up to 21 units of insulin per day Keep checking sugars in the morning before eating  We will arrange follow up call in a week to see how sugars are doing  For cough - go up to twice daily on the omeprazole  Follow up with me in person in 1 month  Be well, Dr. Pollie Meyer

## 2019-07-06 NOTE — Assessment & Plan Note (Signed)
Uncontrolled based on recent A1c as well as fasting sugars. Increase lantus to 21u daily.  Will message pharmacy team and see if they are able to help husband with adjusting insulin. I don't think he will be able to follow instructions for self-titration, he'll need regular phone follow up in order to do this.

## 2019-07-06 NOTE — Assessment & Plan Note (Signed)
Persists. Still suspect GERD. Increase omeprazole to 20mg  twice daily. No significant smoking history to raise concern for underlying COPD If no improvement with this consider CXR.

## 2019-07-14 ENCOUNTER — Telehealth: Payer: Self-pay | Admitting: Pharmacist

## 2019-07-14 NOTE — Telephone Encounter (Signed)
Called patient's husband Eduard Clos - permission given) on 07/14/2019 at 2:10 PM   Fasting blood sugar readings (per patient report) 3/6 244 3/7 238 3/8 236 3/9 213 3/10 180 3/11 205 3/12 178 3/13 246 3/17 179   Recent A1c - 12.7 (06/23/2019) eGFR - >60 (06/08/2018)  [name] confirms he has increased patient's Lantus dose from 20 units to 21 units daily. Patient is tolerating increased dose and is not experiencing side effects. Denies hypoglycemia episodes. Per chart review it appears patient has previously been on metformin , however, was discontinued on 03/15/2017 because of rash. Updated allergies. Patient has never used any other medications for diabetes. Discussed possibility of initiating GLP-1 agonist. It appears Ozempic, Trulicity, and Victoza are all tier 1 agents on insurance formulary.  [name] and patient are interested in hearing more about these medications. Scheduled appt with pharmacy team on 07/20/2019 at 11:15AM. Will continue Lantus 21 units daily (no changes to medicaitons to avoid any confusion). Will discuss initiation of Ozempic 0.25 mg subQ once weekly at follow up appt.   Thank you for involving pharmacy to assist in providing this patient's care.   Drexel Iha, PharmD PGY2 Ambulatory Care Pharmacy Resident

## 2019-07-20 ENCOUNTER — Ambulatory Visit (INDEPENDENT_AMBULATORY_CARE_PROVIDER_SITE_OTHER): Payer: Medicare Other | Admitting: Pharmacist

## 2019-07-20 ENCOUNTER — Encounter: Payer: Self-pay | Admitting: Pharmacist

## 2019-07-20 ENCOUNTER — Other Ambulatory Visit: Payer: Self-pay

## 2019-07-20 VITALS — BP 132/88 | HR 68 | Ht 65.63 in | Wt 199.2 lb

## 2019-07-20 DIAGNOSIS — Z794 Long term (current) use of insulin: Secondary | ICD-10-CM

## 2019-07-20 DIAGNOSIS — E1149 Type 2 diabetes mellitus with other diabetic neurological complication: Secondary | ICD-10-CM | POA: Diagnosis not present

## 2019-07-20 MED ORDER — OZEMPIC (0.25 OR 0.5 MG/DOSE) 2 MG/1.5ML ~~LOC~~ SOPN: 0.2500 mg | PEN_INJECTOR | SUBCUTANEOUS | 11 refills | Status: AC

## 2019-07-20 MED ORDER — PEN NEEDLES 32G X 4 MM MISC: 1.0000 | 11 refills | Status: AC

## 2019-07-20 MED ORDER — INSULIN GLARGINE 100 UNIT/ML SOLOSTAR PEN: 21.0000 [IU] | PEN_INJECTOR | Freq: Every day | SUBCUTANEOUS | 11 refills | Status: DC

## 2019-07-20 NOTE — Patient Instructions (Signed)
Good to see you both today!  Start injecting Ozempic (blue pen) 0.25 mg every week on Thursday for 1 month. Continue injecting Lantus 21 units every evening.  Please pick-up new prescription for Lantus pens and pen needles from pharmacy.  Follow-up with pharmacist clinic in about a month.

## 2019-07-20 NOTE — Progress Notes (Signed)
S:     Chief Complaint  Patient presents with  . Medication Management    diabetes    Patient arrives in good spirits, ambulating without assistance.  She is accompanied by her husband "Eduard Clos" who provides most of the interview answers. Patient shared only short answers to questions.  Patient appeared to understand most questions.   Presents for diabetes evaluation, education, and management. Patient's husband states she notices patient has had a cough that he attributed to donepezil use. However, after D/Cing donepezil they still notice cough. It is a dry cough.    Patient was referred and last seen by Primary Care Provider on Dr. Ardelia Mems on 07/04/2019.  Patient reports Diabetes was diagnosed "several years ago"  -Per chart review, it looks like DM was first noted on 12/13/2002  Family/Social History: "diabetes runs in the family"  Insurance coverage/medication affordability: Medicare ([employer])  Patient reports adherence with medications. Husband, Eduard Clos, administers patient's medications and has a routine. Current diabetes medications include: lantus (insulin glargine) 21 units daily Current hypertension medications include: none  Current hyperlipidemia medications include: atorvastatin 80 mg daily  Patient denies hypoglycemic events.   Patient reported dietary habits: Eats 2 meals/day Breakfast: egg, toast, meat Lunch: infrequent Dinner: sandwich (chicken salad), tacos, pizza Snacks: patient appears to have eaten much less (chips) Drinks: water, coffee (no cream - no sugar)  Patient-reported exercise habits: Minimal walking (enjoys riding more per husband)   Patient reports 1-2 episodes of nocturia (nighttime urination) per night.  Patient denies neuropathy (nerve pain). Patient denies visual changes. She is followed by optometrist/opthalmologist. She recently has bought new glasses. Both her and her husband report going to the eye doctor every year. Patient  reports self foot exams. Husband used to assess feet and cut her toenails. However, he is interested in getting her set up with podiatrist.     O:  Physical Exam Constitutional:      Appearance: Normal appearance.  Skin:    General: Skin is dry.  Neurological:     Mental Status: She is alert.  Psychiatric:        Mood and Affect: Mood normal.        Behavior: Behavior normal.        Thought Content: Thought content normal.        Judgment: Judgment normal.    Review of Systems  Respiratory: Positive for cough.   All other systems reviewed and are negative.    Lab Results  Component Value Date   HGBA1C 12.7 (A) 06/23/2019   There were no vitals filed for this visit.  Lipid Panel     Component Value Date/Time   CHOL 140 06/03/2018 0321   CHOL 127 06/04/2016 1125   TRIG 363 (H) 06/03/2018 0321   HDL <10 (L) 06/03/2018 0321   HDL 45 06/04/2016 1125   CHOLHDL NOT CALCULATED 06/03/2018 0321   VLDL 73 (H) 06/03/2018 0321   LDLCALC NOT CALCULATED 06/03/2018 0321   LDLCALC 62 06/04/2016 1125    Home fasting blood sugars: higher 100s and 200s per report   Clinical Atherosclerotic Cardiovascular Disease (ASCVD): Yes  The ASCVD Risk score Mikey Bussing DC Jr., et al., 2013) failed to calculate for the following reasons:   The patient has a prior MI or stroke diagnosis    A/P: Diabetes longstanding for several years currently uncontrolled. Patient is adherent with medication, per her husband (husband administers her medications). Patient is currently using Lantus vials and syringes. Patient and patient's  husband are interested in transitioning from using insulin syringes to insulin pens. Control is suboptimal due to low health literacy, probable Alzheimer's disease, unhealthy diet, and lack of exercise.  -Transitioned basal insulin Lantus (insulin glargine) vials/syringes to pens/pen needles. Continued Lantus 21 units daily. Sent in new rx for Lantus solostar pens and pen needles.  Provided patient with patient education handout providing instructions on administration.  -Initiated GLP-1 Ozempic (generic name semagluitde) 0.25 mg subQ weekly (Thursdays). Provided patient with patient education handout providing instructions on administration.  Medication Samples have been provided to the patient. The patient has been instructed regarding the correct time, dose, and frequency of taking this medication, including desired effects and most common side effects. Patient was able to demonstrate appropriate insulin pen administration technique and was able use the teach back method appropriately to demonstrate understanding.   Drug name: Ozempic  Qty: 1  LOT:  [id number]  Exp.Date: 05/27/2021 -Extensively discussed pathophysiology of diabetes, recommended lifestyle interventions, dietary effects on blood sugar control -Advised patient's husband to continue to monitor patient's fasting BG readings daily. -Next A1C anticipated 08/2019.   ASCVD risk - secondary prevention in patient with diabetes. Last LDL is controlled, however, was last LDL was taken in 2018. Last lipid panel drawn in 2020   - High intensity statin indicated. Aspirin is indicated.  -Continued aspirin 81 mg  -Continued atorvastatin 80 mg.  - Lipid panel - next encounter if blood sugar improved.    Written patient instructions provided.  Total time in face to face counseling 60 minutes.   Follow up Pharmacist and PCP Clinic Visit in 07/2019.   Patient seen with Viona Gilmore, PharmD PGY-1 Resident and Zachery Conch, PharmD,  PGY2 Pharmacy Resident.

## 2019-07-21 NOTE — Assessment & Plan Note (Signed)
Diabetes longstanding for several years currently uncontrolled. Patient is adherent with medication, per her husband (husband administers her medications). Patient is currently using Lantus vials and syringes. Patient and patient's husband are interested in transitioning from using insulin syringes to insulin pens. Control is suboptimal due to low health literacy, probable Alzheimer's disease, unhealthy diet, and lack of exercise.  -Transitioned basal insulin Lantus (insulin glargine) vials/syringes to pens/pen needles. Continued Lantus 21 units daily. Sent in new rx for Lantus solostar pens and pen needles. Provided patient with patient education handout providing instructions on administration.  -Initiated GLP-1 Ozempic (generic name semagluitde) 0.25 mg subQ weekly (Thursdays). Provided patient with patient education handout providing instructions on administration.  Medication Samples have been provided to the patient. The patient has been instructed regarding the correct time, dose, and frequency of taking this medication, including desired effects and most common side effects. Patient was able to demonstrate appropriate insulin pen administration technique and was able use the teach back method appropriately to demonstrate understanding.   Drug name: Ozempic  Qty: 1  LOT:  [id number]  Exp.Date: 05/27/2021 -Extensively discussed pathophysiology of diabetes, recommended lifestyle interventions, dietary effects on blood sugar control -Advised patient's husband to continue to monitor patient's fasting BG readings daily.

## 2019-07-22 ENCOUNTER — Other Ambulatory Visit: Payer: Self-pay | Admitting: Family Medicine

## 2019-07-24 NOTE — Progress Notes (Signed)
Reviewed: I agree with Dr. Koval's documentation and management. 

## 2019-07-26 ENCOUNTER — Encounter: Payer: Self-pay | Admitting: Family Medicine

## 2019-07-26 ENCOUNTER — Other Ambulatory Visit: Payer: Self-pay

## 2019-07-26 ENCOUNTER — Ambulatory Visit (INDEPENDENT_AMBULATORY_CARE_PROVIDER_SITE_OTHER): Payer: Medicare Other | Admitting: Podiatry

## 2019-07-26 ENCOUNTER — Encounter: Payer: Self-pay | Admitting: Podiatry

## 2019-07-26 DIAGNOSIS — B351 Tinea unguium: Secondary | ICD-10-CM

## 2019-07-26 DIAGNOSIS — M79675 Pain in left toe(s): Secondary | ICD-10-CM | POA: Diagnosis not present

## 2019-07-26 DIAGNOSIS — M79674 Pain in right toe(s): Secondary | ICD-10-CM

## 2019-07-26 LAB — HM DIABETES FOOT EXAM

## 2019-07-26 NOTE — Progress Notes (Signed)
This patient returns to my office for at risk foot care.  This patient requires this care by a professional since this patient will be at risk due to having diabetes.   This patient is unable to cut nails herself since the patient cannot reach her nails.These nails are painful walking and wearing shoes.  This patient presents for at risk foot care today.  She is accompanied today with her husband.  General Appearance  Alert, conversant and in no acute stress.  Vascular  Dorsalis pedis   pulses are palpable  bilaterally. Posterior tibial pulses are weakly palpable  B/L. Capillary return is within normal limits  bilaterally. Temperature is within normal limits  bilaterally.  Neurologic  Senn-Weinstein monofilament wire test within normal limits  bilaterally. Muscle power within normal limits bilaterally.  Nails Thick disfigured discolored nails with subungual debris  from hallux to fifth toes bilaterally. No evidence of bacterial infection or drainage bilaterally.  Orthopedic  No limitations of motion  feet .  No crepitus or effusions noted.  No bony pathology or digital deformities noted.  Skin  normotropic skin with no porokeratosis noted bilaterally.  No signs of infections or ulcers noted.  Vertical split left hallux nail plate.     Onychomycosis  Pain in right toes  Pain in left toes  Consent was obtained for treatment procedures.   Mechanical debridement of nails 1-5  bilaterally performed with a nail nipper.  Filed with dremel without incident.    Return office visit   3 months.                  Told patient to return for periodic foot care and evaluation due to potential at risk complications.   Micheline Markes DPM  

## 2019-08-09 ENCOUNTER — Other Ambulatory Visit: Payer: Self-pay | Admitting: Family Medicine

## 2019-08-15 ENCOUNTER — Ambulatory Visit (INDEPENDENT_AMBULATORY_CARE_PROVIDER_SITE_OTHER): Payer: Medicare Other | Admitting: Pharmacist

## 2019-08-15 ENCOUNTER — Encounter: Payer: Self-pay | Admitting: Pharmacist

## 2019-08-15 ENCOUNTER — Ambulatory Visit (INDEPENDENT_AMBULATORY_CARE_PROVIDER_SITE_OTHER): Payer: Medicare Other | Admitting: Family Medicine

## 2019-08-15 ENCOUNTER — Other Ambulatory Visit: Payer: Self-pay

## 2019-08-15 VITALS — BP 142/72 | HR 74 | Ht 64.0 in | Wt 204.0 lb

## 2019-08-15 DIAGNOSIS — E1149 Type 2 diabetes mellitus with other diabetic neurological complication: Secondary | ICD-10-CM

## 2019-08-15 DIAGNOSIS — Z794 Long term (current) use of insulin: Secondary | ICD-10-CM

## 2019-08-15 DIAGNOSIS — Z8709 Personal history of other diseases of the respiratory system: Secondary | ICD-10-CM

## 2019-08-15 DIAGNOSIS — E785 Hyperlipidemia, unspecified: Secondary | ICD-10-CM | POA: Diagnosis not present

## 2019-08-15 DIAGNOSIS — Z87898 Personal history of other specified conditions: Secondary | ICD-10-CM

## 2019-08-15 MED ORDER — TRIAMCINOLONE ACETONIDE 0.5 % EX OINT: 1.0000 "application " | TOPICAL_OINTMENT | Freq: Two times a day (BID) | CUTANEOUS | 2 refills | Status: DC | PRN

## 2019-08-15 NOTE — Progress Notes (Signed)
    S:     Chief Complaint  Patient presents with  . Medication Management    Diabetes    Patient arrives in fair spirits, she ambulates without assistance, and is accompanied by her husband "[name]".   Presents for diabetes evaluation, education, and management in follow-up of previous pharmacy visit. Patient was referred and last seen by Primary Care Provider, Dr. Pollie Meyer on 07/04/2019.  Insurance coverage/medication affordability: [employer]  Patient reports adherence with medications. However, Billey Gosling has only provided one dose of Ozempic (in office) and has continued Lantus 21 units once daily at night - He is "trying to use all the remaining insulin" Current hyperlipidemia medications include: atorvastatin 80mg   Patient denies hypoglycemic events.  Patient reported dietary habits: Eats 2-3 meals/day Breakfast: Ham biscuit  Lunch:  Chicken salad Dinner: turnip greens, pinto beans,  Snacks: "sometimes" - sour cream and onion potato chips, cookies and ice cream. Drinks: mostly water  Patient-reported exercise habits: limited - walking is limited   Patient reports nocturia (nighttime urination).  1-2 times per night Patient denies neuropathy (nerve pain). Patient denies visual changes. Patient denies self foot exams.     O:  Physical Exam Constitutional:      Appearance: She is normal weight.  Pulmonary:     Effort: Pulmonary effort is normal.  Neurological:     Mental Status: She is alert.  Psychiatric:        Mood and Affect: Mood normal.      Review of Systems  Constitutional: Negative for weight loss.  All other systems reviewed and are negative.    Lab Results  Component Value Date   HGBA1C 12.7 (A) 06/23/2019   There were no vitals filed for this visit.  Lipid Panel     Component Value Date/Time   CHOL 140 06/03/2018 0321   CHOL 127 06/04/2016 1125   TRIG 363 (H) 06/03/2018 0321   HDL <10 (L) 06/03/2018 0321   HDL 45 06/04/2016 1125   CHOLHDL NOT  CALCULATED 06/03/2018 0321   VLDL 73 (H) 06/03/2018 0321   LDLCALC NOT CALCULATED 06/03/2018 0321   LDLCALC 62 06/04/2016 1125    blood sugars: Has not checked lately due to house renovations.    A/P: Diabetes longstanding for several years currently uncontrolled. Patient was not adherent with medication as previously instructed.  Per her husband (husband administers her medications) is in charge of her medication.  Patient is currently using Lantus vials and syringes and wants to complete this supply prior to switching to pens. Control is suboptimal due to low health literacy, probable Alzheimer's disease, unhealthy diet, and sedentary lifestyle.   - Exhaust current supply of lantus in vials.  THEN transition to basal insulin Lantus (insulin glargine) pens/pen needles. Continued Lantus 21 units daily. Provided patient with patient education handout providing instructions on administration.  -Initiated GLP-1 Ozempic (generic name semagluitde) 0.25 mg subQ weekly (Thursdays). Provided patient with patient education handout providing instructions on administration.    Written patient instructions provided.  Total time in face to face counseling 20 minutes.   Follow up PCP visit today.   Patient seen with 06-15-1998, PharmD PGY-1 Resident.

## 2019-08-15 NOTE — Progress Notes (Signed)
Reviewed: I agree with Dr. Koval's documentation and management. 

## 2019-08-15 NOTE — Assessment & Plan Note (Signed)
Diabetes longstanding for several years currently uncontrolled. Patient was not adherent with medication as previously instructed.  Per her husband (husband administers her medications) is in charge of her medication.  Patient is currently using Lantus vials and syringes and wants to complete this supply prior to switching to pens. Control is suboptimal due to low health literacy, probable Alzheimer's disease, unhealthy diet, and sedentary lifestyle.   - Exhaust current supply of lantus in vials.  THEN transition to basal insulin Lantus (insulin glargine) pens/pen needles. Continued Lantus 21 units daily. Provided patient with patient education handout providing instructions on administration.  -Initiated GLP-1 Ozempic (generic name semagluitde) 0.25 mg subQ weekly (Thursdays). Provided patient with patient education handout providing instructions on administration.

## 2019-08-15 NOTE — Patient Instructions (Signed)
It was great to see you again today!  Checking cholesterol, kidneys, liver today  Next visit at end of May  Glad cough is doing better!  Be well, Dr. Pollie Meyer

## 2019-08-15 NOTE — Progress Notes (Signed)
  Date of Visit: 08/15/2019   SUBJECTIVE:   HPI:  Paula Reed presents today for follow up of chronic cough.  Cough - last visit increased omeprazole to 20mg  twice daily. Patient no longer taking omeprazole at all anymore. Previously was coughing during daytime and at night, now cough is just at night. Husband has been giving her "diabetic cough syrup" over the counter at night and it has helped a lot. Overall feels cough is much improved.  Diabetes - seen earlier today by pharmacy. Taking lantus 21u daily. Has added ozempic 0.25mg  weekly (had only one dose prior to today's visit).  Hyperlipidemia - due for lipids today   OBJECTIVE:   BP (!) 142/72   Pulse 74   Ht 5\' 4"  (1.626 m)   Wt 204 lb (92.5 kg)   SpO2 98%   BMI 35.02 kg/m  Gen: no acute distress, pleasant, cooperative HEENT: normocephalic, atraumatic  Lungs: normal work of breathing  Neuro: alert, speech normal  ASSESSMENT/PLAN:   Health maintenance:  -requested eye exam from [facility]  History of chronic cough Improved, off omeprazole. Continue over the counter cough syrup as needed   Hyperlipidemia Check lipids & CMET today Continue statin  Diabetes mellitus, type II (HCC) Followed by pharmacy. Received instruction today on ozempic administration. Follow up with me at end of May for next A1c  J. June, MD [facility]

## 2019-08-15 NOTE — Patient Instructions (Signed)
It was good to see you and your husband today.  Continue using Lantus 21 units nightly from the vials until you've finished all the vials, then transition to using Lantus pens.  Start Ozempic 0.25 mg subcutaneous injection weekly on the same day each week.   Try to eat smaller portions of snacks and encourage you to start exercising.   Follow up with PCP at your next scheduled visit.

## 2019-08-16 LAB — CMP14+EGFR
ALT: 6 IU/L (ref 0–32)
AST: 9 IU/L (ref 0–40)
Albumin/Globulin Ratio: 1.2 (ref 1.2–2.2)
Albumin: 4 g/dL (ref 3.7–4.7)
Alkaline Phosphatase: 118 IU/L — ABNORMAL HIGH (ref 39–117)
BUN/Creatinine Ratio: 14 (ref 12–28)
BUN: 12 mg/dL (ref 8–27)
Bilirubin Total: 0.4 mg/dL (ref 0.0–1.2)
CO2: 24 mmol/L (ref 20–29)
Calcium: 9.2 mg/dL (ref 8.7–10.3)
Chloride: 102 mmol/L (ref 96–106)
Creatinine, Ser: 0.83 mg/dL (ref 0.57–1.00)
GFR calc Af Amer: 81 mL/min/{1.73_m2} (ref >59)
GFR calc non Af Amer: 71 mL/min/{1.73_m2} (ref >59)
Globulin, Total: 3.3 g/dL (ref 1.5–4.5)
Glucose: 194 mg/dL — ABNORMAL HIGH (ref 65–99)
Potassium: 4 mmol/L (ref 3.5–5.2)
Sodium: 140 mmol/L (ref 134–144)
Total Protein: 7.3 g/dL (ref 6.0–8.5)

## 2019-08-16 LAB — LIPID PANEL
Chol/HDL Ratio: 4.4 ratio (ref 0.0–4.4)
Cholesterol, Total: 132 mg/dL (ref 100–199)
HDL: 30 mg/dL — ABNORMAL LOW (ref >39)
LDL Chol Calc (NIH): 76 mg/dL (ref 0–99)
Triglycerides: 150 mg/dL — ABNORMAL HIGH (ref 0–149)
VLDL Cholesterol Cal: 26 mg/dL (ref 5–40)

## 2019-08-16 NOTE — Assessment & Plan Note (Signed)
Followed by pharmacy. Received instruction today on ozempic administration. Follow up with me at end of May for next A1c

## 2019-08-16 NOTE — Assessment & Plan Note (Signed)
Improved, off omeprazole. Continue over the counter cough syrup as needed

## 2019-08-16 NOTE — Assessment & Plan Note (Signed)
Check lipids & CMET today Continue statin

## 2019-09-01 ENCOUNTER — Encounter: Payer: Self-pay | Admitting: Family Medicine

## 2019-09-05 ENCOUNTER — Telehealth: Payer: Self-pay

## 2019-09-05 ENCOUNTER — Ambulatory Visit (INDEPENDENT_AMBULATORY_CARE_PROVIDER_SITE_OTHER): Payer: Medicare Other | Admitting: Family Medicine

## 2019-09-05 ENCOUNTER — Other Ambulatory Visit: Payer: Self-pay

## 2019-09-05 VITALS — BP 174/82 | HR 81 | Wt 208.0 lb

## 2019-09-05 DIAGNOSIS — E1149 Type 2 diabetes mellitus with other diabetic neurological complication: Secondary | ICD-10-CM | POA: Diagnosis not present

## 2019-09-05 DIAGNOSIS — Z794 Long term (current) use of insulin: Secondary | ICD-10-CM | POA: Diagnosis not present

## 2019-09-05 DIAGNOSIS — I10 Essential (primary) hypertension: Secondary | ICD-10-CM

## 2019-09-05 LAB — POCT GLYCOSYLATED HEMOGLOBIN (HGB A1C): HbA1c, POC (controlled diabetic range): 8.6 % — AB (ref 0.0–7.0)

## 2019-09-05 MED ORDER — AMLODIPINE BESYLATE 5 MG PO TABS: 5.0000 mg | ORAL_TABLET | Freq: Every day | ORAL | 3 refills | Status: AC

## 2019-09-05 NOTE — Progress Notes (Signed)
    SUBJECTIVE:   CHIEF COMPLAINT / HPI:   Elevated Blood Pressure Patient presents from home and was asked to come to clinic due to two elevated BP readings by home nurse. She denies any symptoms; no headache, no vision changes or blurry vision or loss of vision, no chest pain, no shortness of breath, no abdominal pain, no nausea or vomiting. She has a history of transient elevated blood pressures in the past but it goes back down and the thought is that it was associated with back pain she was having at the time. She is currently not on any HTN medications and she does not remember being on any in the past. However, note from 2019 says "continue current medications" but she cannot remember what she was taking. No known kidney disease. She is present with her husband who helps manage her care. They lost their home BP cuff.   PERTINENT  PMH / PSH: T2DM, Hx of CVA, HLD  OBJECTIVE:   BP (!) 174/82   Pulse 81   Wt 208 lb (94.3 kg)   SpO2 99%   BMI 35.70 kg/m   Gen: NAD Cardio: RRR, no murmurs, no rubs Resp: CTAB, no wheezing, no crackles Ext: No swelling, no edema of lower extremities  ASSESSMENT/PLAN:   Hypertension HTN confirmed on multiple repeat blood pressures both at home today and at the office.  - Start Amlodipine 5mg  daily at night time.  - Check BP at home in the mornings daily and keep log. Instructed patient to bring the log with them to next appointment. - F/u in two weeks to recheck BP and check home BP log.     , DO [facility]

## 2019-09-05 NOTE — Telephone Encounter (Signed)
Home health nurse calls nurse line with concerns to patient's blood pressure. HH RN reports elevated BP from this morning's visit. BP 200/100, 182/109, and manual BP 190/95. HR 68 Patient is currently asymptomatic.   Patient scheduled for OV this afternoon with Dr. Karen Chafe.   Spoke with preceptor Lyn Hollingshead) regarding patient. Agreed that patient should be evaluated in office, ED not warranted at this time.   Strict ED precautions given.

## 2019-09-05 NOTE — Patient Instructions (Signed)
It was great to meet you today! Thank you for letting me participate in your care!  Today, we discussed your high blood pressure. I am glad you are feeling well and are not having any symptoms. I have started you on a medication to help lower your blood pressure. Please take it as prescribed. Please start checking your blood pressure in the mornings while you are calm and at rest once per day and keeping a log. Please bring that log with you when you return in two weeks.  Be well, Jules Schick, DO PGY-3, [facility]

## 2019-09-06 NOTE — Assessment & Plan Note (Signed)
HTN confirmed on multiple repeat blood pressures both at home today and at the office.  - Start Amlodipine 5mg  daily at night time.  - Check BP at home in the mornings daily and keep log. Instructed patient to bring the log with them to next appointment. - F/u in two weeks to recheck BP and check home BP log.

## 2019-09-18 NOTE — Progress Notes (Signed)
  Date of Visit: 09/19/2019   SUBJECTIVE:   HPI:  Paula Reed presents today for routine follow up, accompanied by her husband.  Diabetes - currently taking ozempic 0.25mg  weekly (thursdays), lantus 21u daily. Not checking sugars consistently as husband misplaced glucometer but recently found it. Agreeable to checking fasting sugars daily. Denies dizziness/lightheadedness.  Hypertension - currently taking amlodipine 5mg  daily. Was recenlty started on this due to significantly elevated blood pressures. Tolerating this medication well. No lower extremity edema.  Back pain - mentions occasional back pain, takes tylenol, improved with this. Denies having fever, saddle anesthesia, lower extremity weakness, or problems with stooling or urination.   OBJECTIVE:   BP 140/70   Pulse 68   Ht 5' 5.43" (1.662 m)   Wt 205 lb 12.8 oz (93.4 kg)   SpO2 100%   BMI 33.80 kg/m   Gen: no acute distress, pleasant, cooperative HEENT: normocephalic, atraumatic  Heart: regular rate and rhythm  Lungs: clear to auscultation bilaterally, normal work of breathing  Neuro: alert, speech normal Ext: No appreciable lower extremity edema bilaterally  Back: spine and paraspinal muscles nontender to palpation   ASSESSMENT/PLAN:   Health maintenance:  -current on HM items  Hypertension Improved on 5mg  amlodipine, continue.  Diabetes mellitus, type II (HCC) Counseled on need to check sugars By symptoms does not seem to have hypoglycemia. Improved A1c recently after starting ozempic Continue present regimen, follow up in 2.5 months for next A1c   Back pain Confirmed ok to take tylenol as needed for back pain, follow up if worsening  FOLLOW UP: Follow up in 3 months for above issues  08-22-1993 J. , MD [facility]

## 2019-09-19 ENCOUNTER — Other Ambulatory Visit: Payer: Self-pay

## 2019-09-19 ENCOUNTER — Ambulatory Visit (INDEPENDENT_AMBULATORY_CARE_PROVIDER_SITE_OTHER): Payer: Medicare Other | Admitting: Family Medicine

## 2019-09-19 DIAGNOSIS — Z794 Long term (current) use of insulin: Secondary | ICD-10-CM | POA: Diagnosis not present

## 2019-09-19 DIAGNOSIS — I10 Essential (primary) hypertension: Secondary | ICD-10-CM | POA: Diagnosis not present

## 2019-09-19 DIAGNOSIS — E1149 Type 2 diabetes mellitus with other diabetic neurological complication: Secondary | ICD-10-CM | POA: Diagnosis not present

## 2019-09-19 NOTE — Patient Instructions (Signed)
Patient left before after visit summary completed

## 2019-09-21 NOTE — Assessment & Plan Note (Signed)
Counseled on need to check sugars By symptoms does not seem to have hypoglycemia. Improved A1c recently after starting ozempic Continue present regimen, follow up in 2.5 months for next A1c

## 2019-09-21 NOTE — Assessment & Plan Note (Signed)
Improved on 5mg  amlodipine, continue.

## 2019-09-25 ENCOUNTER — Other Ambulatory Visit: Payer: Self-pay | Admitting: Family Medicine

## 2019-10-02 ENCOUNTER — Other Ambulatory Visit: Payer: Self-pay | Admitting: Family Medicine

## 2019-10-04 ENCOUNTER — Telehealth: Payer: Self-pay | Admitting: Family Medicine

## 2019-10-04 NOTE — Telephone Encounter (Signed)
Patients husband stopped by today wants to know why RX of Omerprazole 20 mg hasn't been called to [facility] on [address]. Per him pharmacy called yesterday 10/03/19.  Needs this filled.  Patients' [phone]

## 2019-10-25 ENCOUNTER — Other Ambulatory Visit: Payer: Self-pay

## 2019-10-25 ENCOUNTER — Encounter: Payer: Self-pay | Admitting: Podiatry

## 2019-10-25 ENCOUNTER — Ambulatory Visit (INDEPENDENT_AMBULATORY_CARE_PROVIDER_SITE_OTHER): Payer: Medicare Other | Admitting: Podiatry

## 2019-10-25 DIAGNOSIS — M79675 Pain in left toe(s): Secondary | ICD-10-CM | POA: Diagnosis not present

## 2019-10-25 DIAGNOSIS — Z794 Long term (current) use of insulin: Secondary | ICD-10-CM

## 2019-10-25 DIAGNOSIS — E1149 Type 2 diabetes mellitus with other diabetic neurological complication: Secondary | ICD-10-CM

## 2019-10-25 DIAGNOSIS — B351 Tinea unguium: Secondary | ICD-10-CM | POA: Diagnosis not present

## 2019-10-25 DIAGNOSIS — M79674 Pain in right toe(s): Secondary | ICD-10-CM

## 2019-10-25 NOTE — Progress Notes (Signed)
This patient returns to my office for at risk foot care.  This patient requires this care by a professional since this patient will be at risk due to having diabetes.   This patient is unable to cut nails herself since the patient cannot reach her nails.These nails are painful walking and wearing shoes.  This patient presents for at risk foot care today.  She is accompanied today with her husband.  General Appearance  Alert, conversant and in no acute stress.  Vascular  Dorsalis pedis   pulses are palpable  bilaterally. Posterior tibial pulses are weakly palpable  B/L. Capillary return is within normal limits  bilaterally. Temperature is within normal limits  bilaterally.  Neurologic  Senn-Weinstein monofilament wire test within normal limits  bilaterally. Muscle power within normal limits bilaterally.  Nails Thick disfigured discolored nails with subungual debris  from hallux to fifth toes bilaterally. No evidence of bacterial infection or drainage bilaterally.  Orthopedic  No limitations of motion  feet .  No crepitus or effusions noted.  No bony pathology or digital deformities noted.  Skin  normotropic skin with no porokeratosis noted bilaterally.  No signs of infections or ulcers noted.  Vertical split left hallux nail plate.     Onychomycosis  Pain in right toes  Pain in left toes  Consent was obtained for treatment procedures.   Mechanical debridement of nails 1-5  bilaterally performed with a nail nipper.  Filed with dremel without incident.    Return office visit   3 months.                  Told patient to return for periodic foot care and evaluation due to potential at risk complications.   Helane Gunther DPM

## 2019-11-22 ENCOUNTER — Telehealth: Payer: Self-pay | Admitting: *Deleted

## 2019-11-22 ENCOUNTER — Ambulatory Visit: Payer: Medicare Other | Admitting: Neurology

## 2019-11-22 NOTE — Telephone Encounter (Signed)
Called no answer was not able to LVM not set up.

## 2020-01-31 ENCOUNTER — Ambulatory Visit (INDEPENDENT_AMBULATORY_CARE_PROVIDER_SITE_OTHER): Payer: Medicare Other | Admitting: Podiatry

## 2020-01-31 ENCOUNTER — Encounter: Payer: Self-pay | Admitting: Podiatry

## 2020-01-31 ENCOUNTER — Other Ambulatory Visit: Payer: Self-pay

## 2020-01-31 DIAGNOSIS — E1149 Type 2 diabetes mellitus with other diabetic neurological complication: Secondary | ICD-10-CM | POA: Diagnosis not present

## 2020-01-31 DIAGNOSIS — M79675 Pain in left toe(s): Secondary | ICD-10-CM | POA: Diagnosis not present

## 2020-01-31 DIAGNOSIS — B351 Tinea unguium: Secondary | ICD-10-CM | POA: Diagnosis not present

## 2020-01-31 DIAGNOSIS — M79674 Pain in right toe(s): Secondary | ICD-10-CM

## 2020-01-31 DIAGNOSIS — Z794 Long term (current) use of insulin: Secondary | ICD-10-CM

## 2020-01-31 NOTE — Progress Notes (Signed)
This patient returns to my office for at risk foot care.  This patient requires this care by a professional since this patient will be at risk due to having diabetes.   This patient is unable to cut nails herself since the patient cannot reach her nails.These nails are painful walking and wearing shoes.  This patient presents for at risk foot care today.  She is accompanied today with her husband.  General Appearance  Alert, conversant and in no acute stress.  Vascular  Dorsalis pedis   pulses are palpable  bilaterally. Posterior tibial pulses are weakly palpable  B/L. Capillary return is within normal limits  bilaterally. Temperature is within normal limits  bilaterally.  Neurologic  Senn-Weinstein monofilament wire test within normal limits  bilaterally. Muscle power within normal limits bilaterally.  Nails Thick disfigured discolored nails with subungual debris  from hallux to fifth toes bilaterally. No evidence of bacterial infection or drainage bilaterally.  Orthopedic  No limitations of motion  feet .  No crepitus or effusions noted.  No bony pathology or digital deformities noted.  Skin  normotropic skin with no porokeratosis noted bilaterally.  No signs of infections or ulcers noted.  Vertical split left hallux nail plate.     Onychomycosis  Pain in right toes  Pain in left toes  Consent was obtained for treatment procedures.   Mechanical debridement of nails 1-5  bilaterally performed with a nail nipper.  Filed with dremel without incident.    Return office visit   3 months.                  Told patient to return for periodic foot care and evaluation due to potential at risk complications.   Helane Gunther DPM

## 2020-02-04 ENCOUNTER — Other Ambulatory Visit: Payer: Self-pay | Admitting: Family Medicine

## 2020-02-05 ENCOUNTER — Encounter: Payer: Self-pay | Admitting: Neurology

## 2020-02-05 ENCOUNTER — Telehealth: Payer: Self-pay | Admitting: Neurology

## 2020-02-05 ENCOUNTER — Ambulatory Visit: Payer: Medicare Other | Admitting: Neurology

## 2020-02-05 NOTE — Telephone Encounter (Signed)
This patient did not show for a revisit appointment today. 

## 2020-04-02 ENCOUNTER — Encounter: Payer: Self-pay | Admitting: Family Medicine

## 2020-04-02 ENCOUNTER — Ambulatory Visit (INDEPENDENT_AMBULATORY_CARE_PROVIDER_SITE_OTHER): Payer: Medicare Other | Admitting: Family Medicine

## 2020-04-02 ENCOUNTER — Other Ambulatory Visit: Payer: Self-pay

## 2020-04-02 VITALS — BP 140/80 | HR 83 | Ht 65.0 in | Wt 211.2 lb

## 2020-04-02 DIAGNOSIS — R569 Unspecified convulsions: Secondary | ICD-10-CM | POA: Diagnosis not present

## 2020-04-02 DIAGNOSIS — G309 Alzheimer's disease, unspecified: Secondary | ICD-10-CM

## 2020-04-02 DIAGNOSIS — Z23 Encounter for immunization: Secondary | ICD-10-CM

## 2020-04-02 DIAGNOSIS — Z794 Long term (current) use of insulin: Secondary | ICD-10-CM

## 2020-04-02 DIAGNOSIS — F0280 Dementia in other diseases classified elsewhere without behavioral disturbance: Secondary | ICD-10-CM

## 2020-04-02 DIAGNOSIS — E1149 Type 2 diabetes mellitus with other diabetic neurological complication: Secondary | ICD-10-CM

## 2020-04-02 LAB — POCT GLYCOSYLATED HEMOGLOBIN (HGB A1C): Hemoglobin A1C: 7.7 % — AB (ref 4.0–5.6)

## 2020-04-02 NOTE — Progress Notes (Signed)
  Date of Visit: 04/02/2020   SUBJECTIVE:   HPI:  Paula Reed presents today for routine follow up. She is accompanied by her husband Paula Reed.  Diabetes - taking lantus 21u daily and ozempic 0.25mg  weekly. Tolerating well. Does not check sugars very regularly but denies any low sugars or symptoms of low sugars. A1c today 7.7. plans to schedule upcoming eye exam  Alzheimers - memory has continued to decline per husband. No safety concerns (no wandering, oven/stove issues) and no mood concerns. She is overall pleasant and happy.  OBJECTIVE:   BP 140/80   Pulse 83   Ht 5\' 5"  (1.651 m)   Wt 211 lb 3.2 oz (95.8 kg)   SpO2 99%   BMI 35.15 kg/m  Gen: no acute distress, pleasant, cooperative HEENT: normocephalic, atraumatic  Heart: regular rate and rhythm, no murmur Lungs: clear to auscultation bilaterally, normal work of breathing  Neuro: alert, speech normal, grossly nonfocal. Ext: No appreciable lower extremity edema bilaterally   ASSESSMENT/PLAN:   Health maintenance:  -advised to schedule eye exam -flu shot given today  Diabetes mellitus, type II (HCC) Well controlled given age and comorbidities. Continue present regimen. Follow up in 3 months for next A1c. Advised to schedule eye exam.  Seizures (HCC) Remains off Keppra without reported seizures. Following with neurology.  Probable Alzheimer's dementia without behavioral disturbance Cognition continues to decline but no behavioral or safety concerns. Continue supportive care for family They are coping well now; no present needs.  FOLLOW UP: Follow up in 3 mos for A1c  J. [place], MD [facility]

## 2020-04-02 NOTE — Patient Instructions (Signed)
It was great to see you again today!  Schedule eye exam Stay on current medications  Follow up in 3 months  Be well, Dr. Pollie Meyer

## 2020-04-03 NOTE — Assessment & Plan Note (Signed)
Remains off Keppra without reported seizures. Following with neurology.

## 2020-04-03 NOTE — Assessment & Plan Note (Signed)
Well controlled given age and comorbidities. Continue present regimen. Follow up in 3 months for next A1c. Advised to schedule eye exam.

## 2020-04-03 NOTE — Assessment & Plan Note (Signed)
Cognition continues to decline but no behavioral or safety concerns. Continue supportive care for family They are coping well now; no present needs.

## 2020-04-26 ENCOUNTER — Other Ambulatory Visit: Payer: Self-pay | Admitting: Family Medicine

## 2020-05-08 ENCOUNTER — Ambulatory Visit (INDEPENDENT_AMBULATORY_CARE_PROVIDER_SITE_OTHER): Payer: Medicare Other | Admitting: Podiatry

## 2020-05-08 ENCOUNTER — Other Ambulatory Visit: Payer: Self-pay

## 2020-05-08 ENCOUNTER — Encounter: Payer: Self-pay | Admitting: Podiatry

## 2020-05-08 DIAGNOSIS — M79674 Pain in right toe(s): Secondary | ICD-10-CM

## 2020-05-08 DIAGNOSIS — M79675 Pain in left toe(s): Secondary | ICD-10-CM | POA: Diagnosis not present

## 2020-05-08 DIAGNOSIS — E1149 Type 2 diabetes mellitus with other diabetic neurological complication: Secondary | ICD-10-CM | POA: Diagnosis not present

## 2020-05-08 DIAGNOSIS — B351 Tinea unguium: Secondary | ICD-10-CM | POA: Diagnosis not present

## 2020-05-08 DIAGNOSIS — Z794 Long term (current) use of insulin: Secondary | ICD-10-CM

## 2020-05-08 NOTE — Progress Notes (Signed)
This patient returns to my office for at risk foot care.  This patient requires this care by a professional since this patient will be at risk due to having diabetes.   This patient is unable to cut nails herself since the patient cannot reach her nails.These nails are painful walking and wearing shoes.  This patient presents for at risk foot care today.  She is accompanied today with her husband.  General Appearance  Alert, conversant and in no acute stress.  Vascular  Dorsalis pedis   pulses are palpable  bilaterally. Posterior tibial pulses are weakly palpable  B/L. Capillary return is within normal limits  Bilaterally  Cold feet  B/L. Bilaterally.  Absent digital hair.  Neurologic  Senn-Weinstein monofilament wire test within normal limits  bilaterally. Muscle power within normal limits bilaterally.  Nails Thick disfigured discolored nails with subungual debris  from hallux to fifth toes bilaterally. No evidence of bacterial infection or drainage bilaterally.  Orthopedic  No limitations of motion  feet .  No crepitus or effusions noted.  No bony pathology or digital deformities noted.  Skin  normotropic skin with no porokeratosis noted bilaterally.  No signs of infections or ulcers noted.  Vertical split left hallux nail plate.     Onychomycosis  Pain in right toes  Pain in left toes  Consent was obtained for treatment procedures.   Mechanical debridement of nails 1-5  bilaterally performed with a nail nipper.  Filed with dremel without incident.    Return office visit   3 months.                  Told patient to return for periodic foot care and evaluation due to potential at risk complications.   Helane Gunther DPM

## 2020-06-13 ENCOUNTER — Ambulatory Visit
Admission: RE | Admit: 2020-06-13 | Discharge: 2020-06-13 | Disposition: A | Discharge status: Home / Self Care | Payer: Medicare Other | Source: Ambulatory Visit | Attending: Family Medicine | Admitting: Family Medicine

## 2020-06-13 ENCOUNTER — Ambulatory Visit (INDEPENDENT_AMBULATORY_CARE_PROVIDER_SITE_OTHER): Payer: Medicare Other | Admitting: Family Medicine

## 2020-06-13 ENCOUNTER — Other Ambulatory Visit: Payer: Self-pay

## 2020-06-13 VITALS — BP 158/82 | HR 83 | Ht 65.0 in | Wt 203.2 lb

## 2020-06-13 DIAGNOSIS — M7989 Other specified soft tissue disorders: Secondary | ICD-10-CM

## 2020-06-13 DIAGNOSIS — M79631 Pain in right forearm: Secondary | ICD-10-CM

## 2020-06-13 DIAGNOSIS — R21 Rash and other nonspecific skin eruption: Secondary | ICD-10-CM | POA: Diagnosis not present

## 2020-06-13 MED ORDER — TRIAMCINOLONE ACETONIDE 0.5 % EX OINT: 1.0000 "application " | TOPICAL_OINTMENT | Freq: Two times a day (BID) | CUTANEOUS | 2 refills | Status: AC | PRN

## 2020-06-13 NOTE — Progress Notes (Signed)
Subjective:   Patient ID: Paula Reed    DOB: 01/27/1946, 75 y.o. female   MRN: [id number]  Paula Reed is a 75 y.o. female with a history of HTN, CVA, allergic rhinitis, T2DM, dementia, trigger finger, carotid artery bruit, heart murmur, HLD, h/o MRSA bacteremia, seizures here for "spots on neck" and right arm swelling.  Right Arm Swelling, Pain: Patient notes she woke up this morning and her right forearm was swollen and painful. She denies any trauma or falls. Husband notes that he injects her Ozempic in her left arm. Denies ever having this before. Denies history of blood clots.   Rash on neck: Husband and patient endorse rash on back of neck and right side of neck that has been present x 1 week (~Monday). Denies any soaps, lotions and detergents. She has been on Ozempic for a while. Does endorse being more itchy lately.   Review of Systems:  Per HPI.   Objective:   BP (!) 158/82   Pulse 83   Ht 5\' 5"  (1.651 m)   Wt 203 lb 3.2 oz (92.2 kg)   SpO2 95%   BMI 33.81 kg/m  Vitals and nursing note reviewed.  General: pleasant older woman, sitting comfortably in exam chair, well nourished, well developed, in no acute distress with non-toxic appearance Resp: breathing comfortably on room air, speaking in full sentences Skin: warm, dry, thickened dry skin along nape of neck, areas of excoriations, no signs of acute infection, no erythema or warmth. Evidence of excoriations along left forearm, check and upper back. No rash  Extremities: warm and well perfused, normal tone  Right Forearm with localized firm swelling along posterior aspect of forearm, minimal erythema, no warmth, pain with extension and flexion otherwise normal ROM without pain, pain with resisted flexion and extension but no pain with resisted rotation, normal ROM of fingers and elbow, 2+ radial pulses, no swelling of upper portion of forearm   MSK: gait normal, see above  Neuro: Alert and oriented, speech  normal          Assessment & Plan:   Rash Acute lichenified skin along posterior aspect of neck with areas of excoriation. Suspect secondary to chronic rubbing and physical trauma given evidence of excoriations on other parts of body. Low suspicion for acute bacterial superinfection or infestation at this time. - Triamcinolone 0.05% ointment BID x 2 weeks - luke warm water with bathing and moisturizing 2-3x per day. - Recommended humidifier   Pain and swelling of right forearm Acute. Unclear etiology. No precipitating event. Concern for fracture vs VTE at this time. Will start with x-ray and if normal will obtain doppler of forearm to rule out clot. Strict return precautions discussed.   Orders Placed This Encounter  Procedures  . DG Forearm Right    Standing Status:   Future    Number of Occurrences:   1    Standing Expiration Date:   06/13/2021    Order Specific Question:   Reason for Exam (SYMPTOM  OR DIAGNOSIS REQUIRED)    Answer:   acute right forearm swelling    Order Specific Question:   Preferred imaging location?    Answer:   [facility]   Meds ordered this encounter  Medications  . triamcinolone ointment (KENALOG) 0.5 %    Sig: Apply 1 application topically 2 (two) times daily as needed.    Dispense:  30 g    Refill:  2    06/15/2021, DO  PGY-3, [facility] 06/14/2020 9:42 PM

## 2020-06-13 NOTE — Patient Instructions (Signed)
It was a pleasure to see you today!  Thank you for choosing [facility] for your primary care.   Our plans for today were:  Please go to the [facility] as soon as you can to get x-ray.   Please use the Triamcinolone ointment twice a day for 14 days to the neck area. Follow up if no improvement  For your dry and itchy skin - be sure to moisturize 2-3 times a day, use humidifier, bathe in warm (not hot) water and use gentle soaps.  If your arm becomes more swollen, red, and/or warm or if you develop fevers or chills --> CALL IMMEDIATELY or go to the ED.    Best Wishes,   Orpah Cobb, DO

## 2020-06-14 DIAGNOSIS — M79631 Pain in right forearm: Secondary | ICD-10-CM | POA: Insufficient documentation

## 2020-06-14 DIAGNOSIS — M7989 Other specified soft tissue disorders: Secondary | ICD-10-CM | POA: Insufficient documentation

## 2020-06-14 NOTE — Assessment & Plan Note (Addendum)
Acute lichenified skin along posterior aspect of neck with areas of excoriation. Suspect secondary to chronic rubbing and physical trauma given evidence of excoriations on other parts of body. Low suspicion for acute bacterial superinfection or infestation at this time. - Triamcinolone 0.05% ointment BID x 2 weeks - luke warm water with bathing and moisturizing 2-3x per day. - Recommended humidifier

## 2020-06-14 NOTE — Assessment & Plan Note (Signed)
Acute. Unclear etiology. No precipitating event. Concern for fracture vs VTE at this time. Will start with x-ray and if normal will obtain doppler of forearm to rule out clot. Strict return precautions discussed.

## 2020-06-15 ENCOUNTER — Other Ambulatory Visit: Payer: Self-pay

## 2020-06-15 ENCOUNTER — Encounter (HOSPITAL_COMMUNITY): Payer: Self-pay | Admitting: Emergency Medicine

## 2020-06-15 ENCOUNTER — Emergency Department (HOSPITAL_COMMUNITY)
Admission: EM | Admit: 2020-06-15 | Discharge: 2020-06-15 | Disposition: A | Discharge status: Home / Self Care | Payer: Medicare Other | Attending: Emergency Medicine | Admitting: Emergency Medicine

## 2020-06-15 DIAGNOSIS — Z5321 Procedure and treatment not carried out due to patient leaving prior to being seen by health care provider: Secondary | ICD-10-CM | POA: Diagnosis not present

## 2020-06-15 DIAGNOSIS — M7989 Other specified soft tissue disorders: Secondary | ICD-10-CM | POA: Insufficient documentation

## 2020-06-15 NOTE — ED Notes (Signed)
Patient has left. °

## 2020-06-15 NOTE — ED Triage Notes (Signed)
Pt states nothing is wrong with her and that she was told she had to check in as a patient if she wanted to stay with her husband.  Husband (also a pt) states that pt has R arm swelling that started this week with no known cause.  Pt denies.  Pt does have history of probable alzheimer's.

## 2020-06-17 ENCOUNTER — Telehealth: Payer: Self-pay

## 2020-06-17 ENCOUNTER — Other Ambulatory Visit: Payer: Self-pay | Admitting: Family Medicine

## 2020-06-17 DIAGNOSIS — M7989 Other specified soft tissue disorders: Secondary | ICD-10-CM

## 2020-06-17 DIAGNOSIS — E1149 Type 2 diabetes mellitus with other diabetic neurological complication: Secondary | ICD-10-CM

## 2020-06-17 DIAGNOSIS — Z794 Long term (current) use of insulin: Secondary | ICD-10-CM

## 2020-06-17 DIAGNOSIS — F0280 Dementia in other diseases classified elsewhere without behavioral disturbance: Secondary | ICD-10-CM

## 2020-06-17 DIAGNOSIS — I10 Essential (primary) hypertension: Secondary | ICD-10-CM

## 2020-06-17 DIAGNOSIS — M79631 Pain in right forearm: Secondary | ICD-10-CM

## 2020-06-17 DIAGNOSIS — E785 Hyperlipidemia, unspecified: Secondary | ICD-10-CM

## 2020-06-17 DIAGNOSIS — I639 Cerebral infarction, unspecified: Secondary | ICD-10-CM

## 2020-06-17 NOTE — Progress Notes (Signed)
Attempted to contact patient and spouse without success.  Spoke to son (also POA) Batul Diego who notes that it is still swollen and painful. Notes that it appears about the same. He notes that she complains of it feeling really sore. He notes that his father (patient's spouse) noted it looking red. Unsure if this is more red than before. Given normal x-ray, will order ultrasound to rule out blood clot. Also encouraged to schedule follow up appointment at [facility] to evaluate erythema and warmth.

## 2020-06-17 NOTE — Telephone Encounter (Signed)
Patients son calls nurse line stating they would like to move forward with additional imaging. Son reports his mothers arm is still sore and red. I called vein and vascular to set up doppler, they informed me she has an apt already for 2/28 @11am  [place] [facility] [facility]. Son states he will speak to his dad about an apt this week at [facility] if his mothers arm is still warm and red. Son advised to call asap to set that apt up. ED precautions given.

## 2020-06-17 NOTE — Telephone Encounter (Signed)
Transition Care Management Unsuccessful Follow-up Telephone Call ° °Date of discharge and from where:  [facility] 06/16/2020 ° °Attempts:  1st Attempt ° °Reason for unsuccessful TCM follow-up call:  Unable to leave message ° ° ° °

## 2020-06-17 NOTE — Progress Notes (Signed)
Spoke to son/POA regarding lab results. He requested referral to CCM to further discuss assisted living for parents as he is afraid they are unable to care for themselves alone.  Referral placed per request.

## 2020-06-18 ENCOUNTER — Other Ambulatory Visit: Payer: Self-pay | Admitting: Family Medicine

## 2020-06-18 DIAGNOSIS — F0280 Dementia in other diseases classified elsewhere without behavioral disturbance: Secondary | ICD-10-CM

## 2020-06-18 DIAGNOSIS — G309 Alzheimer's disease, unspecified: Secondary | ICD-10-CM

## 2020-06-18 NOTE — Telephone Encounter (Signed)
Transition Care Management Unsuccessful Follow-up Telephone Call  Date of discharge and from where:  06/15/2020 from [facility]  Attempts:  2nd Attempt  Reason for unsuccessful TCM follow-up call:  Unable to reach patient

## 2020-06-19 ENCOUNTER — Telehealth: Payer: Self-pay

## 2020-06-19 DIAGNOSIS — F0280 Dementia in other diseases classified elsewhere without behavioral disturbance: Secondary | ICD-10-CM

## 2020-06-19 DIAGNOSIS — E1149 Type 2 diabetes mellitus with other diabetic neurological complication: Secondary | ICD-10-CM

## 2020-06-19 DIAGNOSIS — I10 Essential (primary) hypertension: Secondary | ICD-10-CM

## 2020-06-19 DIAGNOSIS — I639 Cerebral infarction, unspecified: Secondary | ICD-10-CM

## 2020-06-19 DIAGNOSIS — Z794 Long term (current) use of insulin: Secondary | ICD-10-CM

## 2020-06-19 DIAGNOSIS — E785 Hyperlipidemia, unspecified: Secondary | ICD-10-CM

## 2020-06-19 NOTE — Telephone Encounter (Signed)
Previous transition of care was started under the incorrect encounter department.   [facility] - requested that the referral be re entered.   See message below.   [place], Amy L  Cairrikier [place], [place], CMA This is not our patient. Levert Feinstein is not part of [facility].  Please update your referral to go to the correct department.  Thanks,  Amy    Referral re entered as requested.

## 2020-06-19 NOTE — Telephone Encounter (Signed)
Transition Care Management Follow-up Telephone Call  Date of discharge and from where: 06/15/2020 from   How have you been since you were released from the hospital? Pt is still having arm pain, swelling, redness. Leonette Most (spouse) stated that pt arm is weeping or blistered.   Any questions or concerns? No  Items Reviewed:  Did the pt receive and understand the discharge instructions provided? Yes   Medications obtained and verified? Yes   Other? No   Any new allergies since your discharge? No   Dietary orders reviewed? Heart Healthy.   Do you have support at home? Yes    Functional Questionnaire: (I = Independent and D = Dependent) ADLs: I  Bathing/Dressing- I  Meal Prep- I  Eating- I  Maintaining continence- I  Transferring/Ambulation- I  Managing Meds- I   Follow up appointments reviewed:   PCP Hospital f/u appt confirmed? No  Pt and spouse encouraged to make an appt - informed of xray results and recommendation for an appointment.   Are transportation arrangements needed? No   If their condition worsens, is the pt aware to call PCP or go to the Emergency Dept.? Yes  Was the patient provided with contact information for the PCP's office or ED? Yes  Was to pt encouraged to call back with questions or concerns? Yes

## 2020-06-20 ENCOUNTER — Telehealth: Payer: Self-pay | Admitting: *Deleted

## 2020-06-20 NOTE — Chronic Care Management (AMB) (Unsigned)
  Chronic Care Management   Outreach Note  06/20/2020 Name: Paula Reed MRN: [id number] DOB: 1946/04/02  Paula Reed is a 75 y.o. year old female who is a primary care patient of Pollie Meyer Estevan Ryder, MD. I reached out to Phebe Colla by phone today in response to a referral sent by Ms. Concha Pyo Ruesch's PCP, Latrelle Dodrill, MD.  A telephone outreach was attempted today. The patient was referred to the case management team for assistance with care management and care coordination.   Follow Up Plan: The care management team will reach out to the patient again over the next 7 days. If patient returns call to provider office, please advise to call Embedded Care Management Care Guide Gwenevere Ghazi at [phone].  Gwenevere Ghazi  Care Guide, Embedded Care Coordination [facility] Management

## 2020-06-21 ENCOUNTER — Telehealth: Payer: Self-pay

## 2020-06-21 NOTE — Telephone Encounter (Signed)
   Telephone encounter was:  Successful.  06/21/2020 Name: Paula Reed MRN: [id number] DOB: 31-Jul-1945  Paula Reed is a 75 y.o. year old female who is a primary care patient of Pollie Meyer, Estevan Ryder, MD . The community resource team was consulted for assistance with information for assisted living facilities  Care guide performed the following interventions: Patient provided with information about care guide support team and interviewed to confirm resource needs Investigation of community resources performed emailed information for assisted living facilities to [employer] patient's son [email or web address].  Follow Up Plan:  Care guide will follow up with patient by phone over the next 7 days  Mandalyn Pasqua, AAS Paralegal, [facility] Care Guide . Embedded Care Coordination [facility]  Care Management  [address] [place], [place] [address] ??[email or web address]  ?? [phone]   [email or web address]

## 2020-06-24 ENCOUNTER — Ambulatory Visit (HOSPITAL_COMMUNITY)
Admission: RE | Admit: 2020-06-24 | Discharge: 2020-06-24 | Disposition: A | Discharge status: Home / Self Care | Payer: Medicare Other | Source: Ambulatory Visit | Attending: Internal Medicine | Admitting: Internal Medicine

## 2020-06-24 ENCOUNTER — Telehealth: Payer: Self-pay

## 2020-06-24 ENCOUNTER — Other Ambulatory Visit: Payer: Self-pay

## 2020-06-24 DIAGNOSIS — M79631 Pain in right forearm: Secondary | ICD-10-CM | POA: Insufficient documentation

## 2020-06-24 DIAGNOSIS — M7989 Other specified soft tissue disorders: Secondary | ICD-10-CM

## 2020-06-24 NOTE — Telephone Encounter (Signed)
° °  Telephone encounter was:  Successful.  06/24/2020 Name: Paula Reed MRN: [id number] DOB: Aug 06, 1945  Paula Reed is a 75 y.o. year old female who is a primary care patient of Pollie Meyer Estevan Ryder, MD . The community resource team was consulted for assistance with Dementia care resources and assisted care facilities  Care guide performed the following interventions: Follow up call placed to the patient to discuss status of referral Patient's son received emailed resources for dementia care and assisted living facilities.  Follow Up Plan:  No further follow up planned at this time. The patient has been provided with needed resources.  Aragorn Recker, AAS Paralegal, [facility] Care Guide  Embedded Care Coordination [place]   Care Management  [address] [place], [place] [address] ??[email or web address]   ?? [phone]   [email or web address]

## 2020-06-25 ENCOUNTER — Ambulatory Visit (INDEPENDENT_AMBULATORY_CARE_PROVIDER_SITE_OTHER): Payer: Medicare Other | Admitting: Family Medicine

## 2020-06-25 ENCOUNTER — Encounter: Payer: Self-pay | Admitting: Family Medicine

## 2020-06-25 VITALS — BP 123/80 | HR 88 | Ht 66.0 in | Wt 206.0 lb

## 2020-06-25 DIAGNOSIS — M79631 Pain in right forearm: Secondary | ICD-10-CM

## 2020-06-25 DIAGNOSIS — E1149 Type 2 diabetes mellitus with other diabetic neurological complication: Secondary | ICD-10-CM | POA: Diagnosis not present

## 2020-06-25 DIAGNOSIS — M7989 Other specified soft tissue disorders: Secondary | ICD-10-CM | POA: Diagnosis not present

## 2020-06-25 DIAGNOSIS — Z794 Long term (current) use of insulin: Secondary | ICD-10-CM | POA: Diagnosis not present

## 2020-06-25 LAB — POCT GLYCOSYLATED HEMOGLOBIN (HGB A1C): Hemoglobin A1C: 5.9 % — AB (ref 4.0–5.6)

## 2020-06-25 MED ORDER — INSULIN GLARGINE 100 UNIT/ML SOLOSTAR PEN: 15.0000 [IU] | PEN_INJECTOR | Freq: Every day | SUBCUTANEOUS | 11 refills | Status: DC

## 2020-06-25 NOTE — Assessment & Plan Note (Signed)
Resolved. Unclear what etiology was. Continue to monitor for recurrence.

## 2020-06-25 NOTE — Chronic Care Management (AMB) (Signed)
  Care Management   Note  06/25/2020 Name: SYDNIE SIGMUND MRN: [id number] DOB: Dec 11, 1945  ASHLEI CHINCHILLA is a 75 y.o. year old female who is a primary care patient of Pollie Meyer Estevan Ryder, MD. I reached out to Phebe Colla by phone today in response to a referral sent by Ms. Concha Pyo Rasheed's PCP, Latrelle Dodrill, MD.   Ms. Kumagai was given information about care management services today including:  1. Care management services include personalized support from designated clinical staff supervised by her physician, including individualized plan of care and coordination with other care providers 2. 24/7 contact phone numbers for assistance for urgent and routine care needs. 3. The patient may stop care management services at any time by phone call to the office staff.  Patient agreed to services and verbal consent obtained.   Follow up plan: Telephone appointment with care management team member scheduled for:   [facility] 07/01/2020 Licensed Clinical SW 07/03/2020  Gwenevere Ghazi  Care Guide, Embedded Care Coordination [facility] Management

## 2020-06-25 NOTE — Patient Instructions (Signed)
Please check sugar some mornings.  If it is less than 80, please call us right away If it is routinely less than 100, please call us  Go down on insulin to 15u daily.  Follow up with me in 3 months, sooner if needed.  Please schedule her eye exam  Be well, Dr. Pollie Meyer

## 2020-06-25 NOTE — Assessment & Plan Note (Signed)
A1c today 5.9; suggests control is too tight. Will decrease lantus to 15u daily. Advised to check sugars routinely in AM, and to let us know if routinely running less than 100 (or below 80 ever). Follow up in 3 months for next A1c

## 2020-06-25 NOTE — Chronic Care Management (AMB) (Signed)
  Chronic Care Management   Outreach Note  06/25/2020 Name: Paula Reed MRN: [id number] DOB: 01-08-1946  Paula Reed is a 75 y.o. year old female who is a primary care patient of Pollie Meyer Estevan Ryder, MD. I reached out to Phebe Colla by phone today in response to a referral sent by Ms. Concha Pyo Karl's PCP, Latrelle Dodrill, MD.   A second unsuccessful telephone outreach was attempted today. The patient was referred to the case management team for assistance with care management and care coordination.   Follow Up Plan: A HIPAA compliant phone message was left for the patient providing contact information and requesting a return call. The care management team will reach out to the patient again over the next 7 days. If patient returns call to provider office, please advise to call Embedded Care Management Care Guide Gwenevere Ghazi at [phone].  Gwenevere Ghazi  Care Guide, Embedded Care Coordination [facility] Management

## 2020-06-25 NOTE — Progress Notes (Signed)
  Date of Visit: 06/25/2020   SUBJECTIVE:   HPI:  Paula Reed presents today for routine follow up. Accompanied by her husband Billey Gosling.  Forearm pain - seen on 2/17 by Dr. Mauri Reading for pain in her forearm, had redness and swelling at that time as well. Had xray which was negative for acute process, and doppler which was negative for clot. Symptoms have since resolved.  Diabetes - taking lantus 21u daily and ozempic 0.25mg  weekly. Tolerating these well. Does not check sugars at home but denies any symptoms of low blood sugars.  OBJECTIVE:   BP 123/80   Pulse 88   Ht 5\' 6"  (1.676 m)   Wt 206 lb (93.4 kg)   SpO2 98%   BMI 33.25 kg/m  Gen: no acute distress pleasant cooperaitve HEENT: normocephalic, atraumatic Heart: regular rate and rhythm, no murmur Lungs: clear to auscultation bilaterally, normal work of breathing  Neuro:  Alert, grossly nonfocal, speech noraml Ext: No appreciable lower extremity edema bilaterally. R forearm without swelling, tenderness, or erythema. No warmth. 2+ radial pulses bilaterally   ASSESSMENT/PLAN:   Health maintenance:  -advised again to schedule eye exam  Pain and swelling of right forearm Resolved. Unclear what etiology was. Continue to monitor for recurrence.  Diabetes mellitus, type II (HCC) A1c today 5.9; suggests control is too tight. Will decrease lantus to 15u daily. Advised to check sugars routinely in AM, and to let know if routinely running less than 100 (or below 80 ever). Follow up in 3 months for next A1c  FOLLOW UP: Follow up in 3 mos for next A1c  [place]. [place], MD [facility]

## 2020-06-28 ENCOUNTER — Telehealth: Payer: Medicare Other

## 2020-07-01 ENCOUNTER — Ambulatory Visit: Payer: Medicare Other

## 2020-07-02 NOTE — Chronic Care Management (AMB) (Signed)
Care Management    RN Visit Note  07/02/2020 Name: Paula Reed MRN: [id number] DOB: 1946/03/12  Subjective: Paula Reed is a 75 y.o. year old female who is a primary care patient of Paula Mems Delorse Limber, MD. The care management team was consulted for assistance with disease management and care coordination needs.    Engaged with patient by telephone for initial visit in response to provider referral for case management and/or care coordination services.   Consent to Services:   Paula Reed was given information about Care Management services today including:  1. Care Management services includes personalized support from designated clinical staff supervised by her physician, including individualized plan of care and coordination with other care providers 2. 24/7 contact phone numbers for assistance for urgent and routine care needs. 3. The patient may stop case management services at any time by phone call to the office staff.  Patient agreed to services and consent obtained.    Assessment: Patient is currently experiencing difficulty with not checking her blood sugars... See Care Plan below for interventions and patient self-care actives. Follow up Plan: Patient would like continued follow-up.  CCM RNCM  will will follow up with the patient within the next 14 days.. Patient will call office if needed prior to next encounter  Review of patient past medical history, allergies, medications, health status, including review of consultants reports, laboratory and other test data, was performed as part of comprehensive evaluation and provision of chronic care management services.   SDOH (Social Determinants of Health) assessments and interventions performed:    Care Plan  Allergies  Allergen Reactions  . Metformin And Related Rash  . Chlorthalidone Rash    Per [facility] records, pt may have had a rash rxn to chlorthalidone    Outpatient Encounter Medications as of 07/01/2020   Medication Sig  . amLODipine (NORVASC) 5 MG tablet Take 1 tablet (5 mg total) by mouth at bedtime.  [name] aspirin EC 81 MG tablet Take 81 mg by mouth daily.  [name] atorvastatin (LIPITOR) 80 MG tablet TAKE 1 TABLET BY MOUTH DAILY AT 6 PM.  . Blood Glucose Monitoring Suppl (ONETOUCH VERIO) w/Device KIT 1 kit by Does not apply route daily. Check sugar twice daily  . glucose blood (ONETOUCH VERIO) test strip Check sugar twice daily  . insulin glargine (LANTUS) 100 UNIT/ML Solostar Pen Inject 15 Units into the skin daily.  . Insulin Pen Needle (PEN NEEDLES) 32G X 4 MM MISC Inject 1 Dose into the skin as directed.  . Insulin Syringe-Needle U-100 (BD INSULIN SYRINGE U/F) 31G X 5/16" 0.3 ML MISC USE TO INJECT LANTUS DAILY  . ONETOUCH DELICA LANCETS 27O MISC Check sugar twice daily  . Semaglutide,0.25 or 0.5MG/DOS, (OZEMPIC, 0.25 OR 0.5 MG/DOSE,) 2 MG/1.5ML SOPN Inject 0.25 mg into the skin once a week. (Thursdays)  . triamcinolone ointment (KENALOG) 0.5 % Apply 1 application topically 2 (two) times daily as needed.   No facility-administered encounter medications on file as of 07/01/2020.    Patient Active Problem List   Diagnosis Date Noted  . Pain and swelling of right forearm 06/14/2020  . Pain due to onychomycosis of toenails of both feet 07/26/2019  . History of chronic cough 02/10/2019  . Ischemic cerebrovascular accident (CVA) ([place]) 08/26/2018  . MRSA bacteremia 06/01/2018  . Allergic rhinitis 05/12/2018  . Seizures ([place]) 06/28/2017  . Probable Alzheimer's dementia without behavioral disturbance 09/15/2015  . Rash and nonspecific skin eruption 12/18/2014  . Rash 09/20/2014  .  Bruit of right carotid artery 03/26/2014  . Trigger finger 11/03/2012  . Heart murmur 04/22/2012  . Diabetes mellitus, type II ([place]) 02/02/2012  . Hypertension 02/02/2012  . Hyperlipidemia 02/02/2012  . Routine adult health maintenance 02/02/2012    Conditions to be addressed/monitored: DMII  Care Plan : RN Case  Management  Updates made by Lazaro Arms, RN since 07/02/2020 12:00 AM  Problem: Glycemic Management (Diabetes, Type 2)   Goal: Glycemic Management of Diabetes  Type 2  by checking blood sugars   Start Date: 07/01/2020  Expected End Date: 09/24/2020  This Visit's Progress: Not on track  Priority: High  Objective:  Lab Results  Component Value Date   HGBA1C 5.9 (A) 06/25/2020 .   Lab Results  Component Value Date   CREATININE 0.83 08/15/2019   CREATININE 0.58 06/08/2018   CREATININE 0.59 06/07/2018    Current Barriers:  [name] Knowledge Deficits related to basic Diabetes pathophysiology and self care/management  Case Manager Clinical Goal(s):  Over the next 30 days, patient will demonstrate improved adherence to prescribed treatment plan for diabetes self care/management as evidenced by: checking blood sugars once daily to prevent hypoglycemia  Interventions:  . Provided education to patient about basic DM disease process . Reviewed medications with patient/spouse and discussed importance of medication adherence . Discussed plans with patient for ongoing care management follow up and provided patient with direct contact information for care management team . activity based on tolerance and functional limitations encouraged . healthy lifestyle promoted  Anticipate A1C testing (point-of-care) every 3 to 6 months based on goal attainment.   Promote self-monitoring of blood glucose levels. -discussed problems with the glucometer and advised the spouse to have the batteries changed in the device. RNCM will follow up regarding checking blood sugars.  Assess and address barriers to management plan, such as food insecurity, age, developmental ability, depression, anxiety, fear of hypoglycemia or weight gain, as well as medication cost, side effects and complicated regimen. -Spouse stated that they were able to pay for medications , he gives his wife her meds, he was able to tell me the correct  dosage of her Lantus and ozempic. He stated that they have transportation to appointments . quality of sleep assessed . reduction of sedentary activity encouraged . blood glucose monitoring encouraged . blood glucose readings reviewed-unable to give results patient does not check blood sugars.  Patient Goals/Self Care Activities:   Patient verbalizes understanding of plan Self-administers medications as prescribed  Calls pharmacy for medication refills  Call's provider office for new concerns or questions  check blood sugar at prescribed times  check blood sugar if I feel it is too high or too low         Lazaro Arms RN, BSN, [facility] Care Management Coordinator [place] Phone: [phone] I Fax: [phone]

## 2020-07-03 ENCOUNTER — Ambulatory Visit: Payer: Medicare Other | Admitting: Licensed Clinical Social Worker

## 2020-07-03 NOTE — Chronic Care Management (AMB) (Signed)
    Clinical Social Work  Care Management  Scheduled Phone Outreach    07/03/2020 Name: Paula Reed MRN: [id number] DOB: [birth year]  Paula Reed is a 75 y.o. year old female who is a primary care patient of Latrelle Dodrill, MD .   LCSW reached out to patient's husband today by phone to introduce self, assess needs and offer Care Management services and interventions.   [name] stated he was on his way out the door and was unable to take the call.  Requested to reschedule.   Plan: Phone appointment scheduled 07/04/20 at 2:00  Review of patient status, including review of consultants reports, relevant laboratory and other test results, and collaboration with appropriate care team members and the patient's provider was performed as part of comprehensive patient evaluation and provision of care management services.    Sammuel Hines, LCSW Care Management & Coordination  [facility] / [facility]   [phone] 10:28 AM

## 2020-07-04 ENCOUNTER — Ambulatory Visit: Payer: Medicare Other | Admitting: Licensed Clinical Social Worker

## 2020-07-04 DIAGNOSIS — Z7189 Other specified counseling: Secondary | ICD-10-CM

## 2020-07-04 DIAGNOSIS — Z139 Encounter for screening, unspecified: Secondary | ICD-10-CM

## 2020-07-04 NOTE — Patient Instructions (Signed)
  Paula Reed  it was nice speaking with you. Please call me directly [phone] if you have questions about the goals we discussed. Goals Addressed            This Visit's Progress   . Effective Long-Term Care Planning/ advance directive        Patient Goals/Self-Care Activities :  . Review mailed information and Complete Advance Directive packet,  . Call LCSW if you have questions  . Have advance directive notarized and provide a copy to provider office     Paula Reed received Care Coordination services today:  1. Care Coordination services include personalized support from designated clinical staff supervised by her physician, including individualized plan of care and coordination with other care providers 2. 24/7 contact [phone] for assistance for urgent and routine care needs. 3. Care Coordination are voluntary services and be declined at any time by calling the office.  Patient verbalizes understanding of instructions provided today.    Follow up plan: Appointment scheduled for SW follow up with client by phone on: 08/07/20  Soundra Pilon, LCSW

## 2020-07-04 NOTE — Chronic Care Management (AMB) (Signed)
Care Management Clinical Social Work Note  07/04/2020 Name: Paula Reed MRN: [id number] DOB: 14-Dec-1945  Paula Reed is a 74 y.o. year old female who is a primary care patient of Paula Mems Delorse Limber, MD.  The Care Management team was consulted for assistance with coordination needs for SDOH and possible PCS.  Engaged with patient and husband Paula Reed by telephone for initial visit in response to provider referral for social work chronic care management and care coordination services  Consent to Services:  Paula Reed was given information about Care Management services today including:  1. Care Management services includes personalized support from designated clinical staff supervised by her physician, including individualized plan of care and coordination with other care providers 2. 24/7 contact phone numbers for assistance for urgent and routine care needs. 3. The patient may stop case management services at any time by phone call to the office staff. Patient and husband agreed to services and consent obtained.    Assessment: Patient and husband denied needing support in the home.  Report she is able to meet all ADL's. Disucssed PCS and ALF.  Both denied. State they will contact office if needed. See Care Plan below for interventions and patient self-care actives. Recommendation: Patient may benefit from, PCS for additional support however both patient and husband declined.    Follow up Plan: Mailed advance directive packet to patient and husband. Patient is open to LCSW doing another outreach ref. Advance directives.[name]  LCSW will f/u in 30 days.  Reminded patient of phone appointment with CCM RN.   Review of patient past medical history, allergies, medications, and health status, including review of relevant consultants reports was performed today as part of a comprehensive evaluation and provision of chronic care management and care coordination services.  SDOH (Social  Determinants of Health) assessments and interventions performed: No needs identified  Advanced Directives Status: See Care Plan for related entries.  Care Plan  Allergies  Allergen Reactions  . Metformin And Related Rash  . Chlorthalidone Rash    Per [facility] records, pt may have had a rash rxn to chlorthalidone    Outpatient Encounter Medications as of 07/04/2020  Medication Sig  . amLODipine (NORVASC) 5 MG tablet Take 1 tablet (5 mg total) by mouth at bedtime.  [name] aspirin EC 81 MG tablet Take 81 mg by mouth daily.  [name] atorvastatin (LIPITOR) 80 MG tablet TAKE 1 TABLET BY MOUTH DAILY AT 6 PM.  . Blood Glucose Monitoring Suppl (ONETOUCH VERIO) w/Device KIT 1 kit by Does not apply route daily. Check sugar twice daily  . glucose blood (ONETOUCH VERIO) test strip Check sugar twice daily  . insulin glargine (LANTUS) 100 UNIT/ML Solostar Pen Inject 15 Units into the skin daily.  . Insulin Pen Needle (PEN NEEDLES) 32G X 4 MM MISC Inject 1 Dose into the skin as directed.  . Insulin Syringe-Needle U-100 (BD INSULIN SYRINGE U/F) 31G X 5/16" 0.3 ML MISC USE TO INJECT LANTUS DAILY  . ONETOUCH DELICA LANCETS 82N MISC Check sugar twice daily  . Semaglutide,0.25 or 0.5MG/DOS, (OZEMPIC, 0.25 OR 0.5 MG/DOSE,) 2 MG/1.5ML SOPN Inject 0.25 mg into the skin once a week. (Thursdays)  . triamcinolone ointment (KENALOG) 0.5 % Apply 1 application topically 2 (two) times daily as needed.   No facility-administered encounter medications on file as of 07/04/2020.    Patient Active Problem List   Diagnosis Date Noted  . Pain and swelling of right forearm 06/14/2020  . Pain due to onychomycosis of  toenails of both feet 07/26/2019  . History of chronic cough 02/10/2019  . Ischemic cerebrovascular accident (CVA) ([place]) 08/26/2018  . MRSA bacteremia 06/01/2018  . Allergic rhinitis 05/12/2018  . Seizures ([place]) 06/28/2017  . Probable Alzheimer's dementia without behavioral disturbance 09/15/2015  . Rash and  nonspecific skin eruption 12/18/2014  . Rash 09/20/2014  . Bruit of right carotid artery 03/26/2014  . Trigger finger 11/03/2012  . Heart murmur 04/22/2012  . Diabetes mellitus, type II ([place]) 02/02/2012  . Hypertension 02/02/2012  . Hyperlipidemia 02/02/2012  . Routine adult health maintenance 02/02/2012    Conditions to be addressed/monitored: Dementia; Advance Directive  Care Plan : Clinical Social Worker  Updates made by Paula Cane, LCSW since 07/04/2020 12:00 AM  Problem: no advance directive   Goal: Effective Long-Term Care Planning   Start Date: 07/04/2020  Expected End Date: 09/24/2020  This Visit's Progress: On track  Priority: Medium  Current barriers:   . Patient does not have an advance directive.  . Needs education, support and coordination in order to meet this need. Clinical Goal(s): Over the next 45 days, the patient will review mailed advance directive,   Interventions: . Collaboration with PCP regarding development and update of comprehensive plan of care as evidenced by provider attestation and co-signature. Paula Reed care team collaboration (see longitudinal plan of care) . Assessed understanding of Advance Directives. . A voluntary discussion about advanced care planning including importance of advanced directives, healthcare proxy and living will was discussed with the patient and  husband, Mailed advance directive packet. . Encourage investigation of services such as PCS home health aide and or assisted living ( patient and husband not  interested) Patient Goals/Self-Care Activities : Over the next 60 days . Review mailed information and Complete Advance Directive packet,  . Call LCSW if you have questions  . Have advance directive notarized and provide a copy to provider office     Paula Reed, [place] / [place]   [phone] 3:38 PM

## 2020-07-16 ENCOUNTER — Telehealth: Payer: Medicare Other

## 2020-07-17 ENCOUNTER — Telehealth: Payer: Self-pay

## 2020-07-17 NOTE — Telephone Encounter (Signed)
  Care Management   Outreach Note  07/17/2020 Name: Paula Reed MRN: [id number] DOB: June 21, 1945  Referred by: Latrelle Dodrill, MD Reason for referral : Chronic Care Management (DM II)   An unsuccessful telephone outreach was attempted today. The patient was referred to the case management team for assistance with care management and care coordination.   Unable to leave a message voicemail not set up.  Follow Up Plan: The care management team will reach out to the patient again over the next 7-14 days.   Juanell Fairly RN, BSN, [facility] Care Management Coordinator [facility] Phone: [phone] I Fax: [phone]

## 2020-07-26 NOTE — Telephone Encounter (Signed)
Rescheduled 08/05/2020  Paula Reed  Care Guide, Embedded Care Coordination [facility] Management

## 2020-08-05 ENCOUNTER — Telehealth: Payer: Medicare Other

## 2020-08-05 ENCOUNTER — Telehealth: Payer: Self-pay

## 2020-08-05 NOTE — Telephone Encounter (Signed)
  Care Management   Outreach Note  08/05/2020 Name: Paula Reed MRN: [id number] DOB: [birth year]  Referred by: Latrelle Dodrill, MD Reason for referral : Chronic Care Management (DM II)   A second unsuccessful telephone outreach was attempted today. The patient was referred to the case management team for assistance with care management and care coordination.  Unable to leave a message due to mailbox has not been set up.  Follow Up Plan: The care management team will reach out to the patient again over the next 7-14 days.   Juanell Fairly RN, BSN, [facility] Care Management Coordinator [facility] Phone: [phone] I Fax: [phone]

## 2020-08-06 ENCOUNTER — Ambulatory Visit: Payer: Medicare Other | Admitting: Podiatry

## 2020-08-07 ENCOUNTER — Telehealth: Payer: Self-pay

## 2020-08-07 ENCOUNTER — Ambulatory Visit: Payer: Medicare Other | Admitting: Licensed Clinical Social Worker

## 2020-08-07 DIAGNOSIS — Z719 Counseling, unspecified: Secondary | ICD-10-CM

## 2020-08-07 DIAGNOSIS — Z789 Other specified health status: Secondary | ICD-10-CM

## 2020-08-07 DIAGNOSIS — Z7189 Other specified counseling: Secondary | ICD-10-CM

## 2020-08-07 NOTE — Telephone Encounter (Signed)
I was alerted by our LCSW to call the patient as she was complaining of pain while on care management call. I spoke with husband and he reports her hands have been bothering her and he has noticed some raised dark areas. Husband denies any fever or chills at this time or discharge coming from sites. Patient scheduled for 4/14 to be evaluated.

## 2020-08-07 NOTE — Progress Notes (Signed)
    SUBJECTIVE:   CHIEF COMPLAINT / HPI:   Ms. Winterton is a 75 yo who is accompanied by her husband and presents with L hand pain and discoloration.  Patient with underlying dementia, and husband provided most of the history.  States he noticed dark color or L hand about 2 months ago that recently has started to cause pain.  They deny any falls or other injuries or trauma. States she is unable to raise arm above head and that it hurts to squeeze objects.  Does not recall having this happen before.  Denies taking anything for pain, or being on an anticoagulant.  Denies any new changes to medications. Husband also mentioned that patient's left thigh bothers her as well which occurred around the same time frame. Denies discoloration of leg.   PERTINENT  PMH / PSH:  DM2. HTN, dementia   OBJECTIVE:   BP (!) 132/58   Pulse 94   Ht 5\' 6"  (1.676 m)   Wt 200 lb 8 oz (90.9 kg)   SpO2 99%   BMI 32.36 kg/m    Physical exam  General: Sitting in wheelchair, quiet, NAD Cardiovascular: RRR, no murmurs Lungs: CTAB. Normal WOB Abdomen: soft, non-distended Extremities: L hand with ecchymosis predominantly around thenar in both palmar and dorsal areas (pictures below). Mild edema. Warm to touch. Very tender to light palpation along this area. Reduction in grip strength (4/5) in L hand compared to right.         ASSESSMENT/PLAN:   No problem-specific Assessment & Plan notes found for this encounter.  Pain and ecchymosis of left hand Patient and husband denies any injury or trauma to the hand.  Denies taking anticoagulation medication. Denies history of bleeding disorder. Discussed case with preceptor Dr. who also came to examine patient's hand. Advised the patient that she can take Tylenol for pain, as well as ice and apply a loose bandage for support. We will check labs to ensure she does not have issues with bleeding.  Advised to follow-up with PCP in the next 2 to 3 weeks to check on  hand and leg pain. - CBC - INR  Called patient to discuss results on 4/16, but there was no answer and could not leave a message because voicemail was not set up.    5/16, DO [facility]

## 2020-08-07 NOTE — Patient Instructions (Signed)
  Paula Reed  it was nice speaking with you and your husband. Please call me directly if you have questions about the goals we discussed. Goals Addressed            This Visit's Progress   . Effective Long-Term Care Planning/ advance directive        Patient Goals/Self-Care Activities :  .  I have mailed you a packet with information about assisted living facilities . I will give you a follow up call to review the information in 2 weeks      Paula Reed received Care Coordination services today:  1. Care Coordination services include personalized support from designated clinical staff supervised by her physician, including individualized plan of care and coordination with other care providers 2. 24/7 contact [phone] for assistance for urgent and routine care needs. 3. Care Coordination are voluntary services and be declined at any time by calling the office.  Patient's husband verbalizes understanding of instructions provided today.    Follow up plan: SW will follow up with patient by phone over the next 2 weeks  Soundra Pilon, LCSW  [phone]

## 2020-08-07 NOTE — Chronic Care Management (AMB) (Signed)
Care Management   Clinical Social Work Note  08/07/2020 Name: Paula Reed MRN: [id number] DOB: 26-Oct-1945  Paula Reed is a 75 y.o. year old female who is a primary care patient of Paula Mems Delorse Limber, MD. The CCM team was consulted to assist the patient with chronic disease management and/or care coordination needs related to: Level of Care Concerns.   Engaged with patient's husband and wife by telephone for follow up visit in response to provider referral for social work chronic care management and care coordination services.   Consent to Services:  The patient was given information about Chronic Care Management services, agreed to services, and gave verbal consent prior to initiation of services.  Please see initial visit note for detailed documentation.   Patient agreed to services and consent obtained.   Assessment: .Patient's husband does not want to discuss advance directives during this encounter.  Would like information for assisted living.  Also expressed concern with patient having pain and needing to see PCP as soon as possible. See Care Plan below for interventions and patient self-care actives.  Recommendation: Patient may benefit from, and is in agreement to allow Paula Reed to coordinate care with [facility] RN Reed to assess needs and get patient an appointment. Appointment schedules in Reed 08/08/20 with provider.  ALF Reed will be given to patient and husband during 08/08/20 office visit. Collaboration with Paula Reed staff for patient's care during this encounter.   Follow up Plan: No follow up scheduled with CCM team at this time. Paula Reed will f/u with patient's husband next week after he has a chance to review the Reed of information.   Review of patient past medical history, allergies, medications, and health status, including review of relevant consultants reports was performed today as part of a comprehensive evaluation and provision of chronic care management and care  coordination services.     SDOH (Social Determinants of Health) assessments and interventions performed:    Advanced Directives Status: Not ready or willing to discuss.  CCM Care Plan  Allergies  Allergen Reactions  . Metformin And Related Rash  . Chlorthalidone Rash    Per [facility] records, pt may have had a rash rxn to chlorthalidone    Outpatient Encounter Medications as of 08/07/2020  Medication Sig  . amLODipine (NORVASC) 5 MG tablet Take 1 tablet (5 mg total) by mouth at bedtime.  [name] aspirin EC 81 MG tablet Take 81 mg by mouth daily.  [name] atorvastatin (LIPITOR) 80 MG tablet TAKE 1 TABLET BY MOUTH DAILY AT 6 PM.  . Blood Glucose Monitoring Suppl (ONETOUCH VERIO) w/Device KIT 1 kit by Does not apply route daily. Check sugar twice daily  . glucose blood (ONETOUCH VERIO) test strip Check sugar twice daily  . insulin glargine (LANTUS) 100 UNIT/ML Solostar Pen Inject 15 Units into the skin daily.  . Insulin Pen Needle (PEN NEEDLES) 32G X 4 MM MISC Inject 1 Dose into the skin as directed.  . Insulin Syringe-Needle U-100 (BD INSULIN SYRINGE U/F) 31G X 5/16" 0.3 ML MISC USE TO INJECT LANTUS DAILY  . ONETOUCH DELICA LANCETS 49F MISC Check sugar twice daily  . Semaglutide,0.25 or 0.5MG/DOS, (OZEMPIC, 0.25 OR 0.5 MG/DOSE,) 2 MG/1.5ML SOPN Inject 0.25 mg into the skin once a week. (Thursdays)  . triamcinolone ointment (KENALOG) 0.5 % Apply 1 application topically 2 (two) times daily as needed.   No facility-administered encounter medications on file as of 08/07/2020.    Patient Active Problem List   Diagnosis Date  Noted  . Pain and swelling of right forearm 06/14/2020  . Pain due to onychomycosis of toenails of both feet 07/26/2019  . History of chronic cough 02/10/2019  . Ischemic cerebrovascular accident (CVA) ([place]) 08/26/2018  . MRSA bacteremia 06/01/2018  . Allergic rhinitis 05/12/2018  . Seizures ([place]) 06/28/2017  . Probable Alzheimer's dementia without behavioral disturbance  09/15/2015  . Rash and nonspecific skin eruption 12/18/2014  . Rash 09/20/2014  . Bruit of right carotid artery 03/26/2014  . Trigger finger 11/03/2012  . Heart murmur 04/22/2012  . Diabetes mellitus, type II ([place]) 02/02/2012  . Hypertension 02/02/2012  . Hyperlipidemia 02/02/2012  . Routine adult health maintenance 02/02/2012    Conditions to be addressed/monitored: Dementia; Level of care concerns  Care Plan : Clinical Social Worker  Updates made by Paula Cane, Paula Reed since 08/07/2020 12:00 AM  Problem: no advance directive   Goal: Effective Long-Term Care Planning ( Advance directives/ ALF)   Start Date: 07/04/2020  Expected End Date: 09/24/2020  This Visit's Progress: Not on track  Recent Progress: On track  Priority: Medium  Current barriers:   . Patient does not have an advance directive and husband is not ready to move forward at this time . Needs education, support and coordination in order to meet this need. Clinical Goal(s): Over the next 45 days, the patient will review Paula advance directive and assisted living information    Interventions: . Collaboration with PCP regarding development and update of comprehensive plan of care as evidenced by provider attestation and co-signature. Paula Reed care team collaboration (see longitudinal plan of care) . Assessed understanding of Advance Directives. . A voluntary discussion about advanced care planning including importance of advanced directives, healthcare proxy and living will was discussed with the patient and  husband, Paula Reed. Patient's husband states he has not received the Reed in the mail and does not wish to address this right now;  [name]  Patient's Husband does not want PCS aide would like to explore information for assisted living  Patient Goals/Self-Care Activities : Over the next 60 days . I have Paula Reed . I will give  you a follow up call to review the information in 2 weeks     Paula Reed, [place] / [place]   [phone] 2:49 PM

## 2020-08-08 ENCOUNTER — Encounter: Payer: Self-pay | Admitting: Family Medicine

## 2020-08-08 ENCOUNTER — Ambulatory Visit (INDEPENDENT_AMBULATORY_CARE_PROVIDER_SITE_OTHER): Payer: Medicare Other | Admitting: Family Medicine

## 2020-08-08 ENCOUNTER — Other Ambulatory Visit: Payer: Self-pay

## 2020-08-08 VITALS — BP 132/58 | HR 94 | Ht 66.0 in | Wt 200.5 lb

## 2020-08-08 DIAGNOSIS — M79642 Pain in left hand: Secondary | ICD-10-CM

## 2020-08-08 NOTE — Patient Instructions (Addendum)
It was great seeing you today!  Today you came in for your L hand pain. You can take Tylenol 500mg  every 4-6 hours as needed for pain. You can also apply ice and use a loose ace bandage around your hand.    Please check-out at the front desk before leaving the clinic. Please schedule to see your PCP to follow up on your hand and leg pain in the next 2-3 weeks, but if you need to be seen earlier than that for any new issues we're happy to fit you in, just give a call!  Regarding lab work today:  Due to recent changes in healthcare laws, you may see the results of your imaging and laboratory studies on MyChart before your provider has had a chance to review them.  I understand that in some cases there may be results that are confusing or concerning to you. Not all laboratory results come back in the same time frame and you may be waiting for multiple results in order to interpret others.  Please give [place] 72 hours in order for your provider to thoroughly review all the results before contacting the office for clarification of your results. If everything is normal, you will get a letter in the mail or a message in My Chart. Please give [place] a call if you do not hear from [place] after 2 weeks.     If you haven't already, sign up for My Chart to have easy access to your labs results, and communication with your primary care physician.  Feel free to call with any questions or concerns at any time, at [phone].   Take care,  Dr. [phone] [facility]

## 2020-08-09 LAB — CBC
Hematocrit: 25.2 % — ABNORMAL LOW (ref 34.0–46.6)
Hemoglobin: 7.5 g/dL — ABNORMAL LOW (ref 11.1–15.9)
MCH: 27.6 pg (ref 26.6–33.0)
MCHC: 29.8 g/dL — ABNORMAL LOW (ref 31.5–35.7)
MCV: 93 fL (ref 79–97)
Platelets: 527 10*3/uL — ABNORMAL HIGH (ref 150–450)
RBC: 2.72 x10E6/uL — CL (ref 3.77–5.28)
RDW: 15.4 % (ref 11.7–15.4)
WBC: 11 10*3/uL — ABNORMAL HIGH (ref 3.4–10.8)

## 2020-08-09 LAB — PROTIME-INR
INR: 1 (ref 0.9–1.2)
Prothrombin Time: 10.4 s (ref 9.1–12.0)

## 2020-08-12 ENCOUNTER — Inpatient Hospital Stay (HOSPITAL_COMMUNITY): Payer: Medicare Other | Admitting: Certified Registered Nurse Anesthetist

## 2020-08-12 ENCOUNTER — Telehealth: Payer: Medicare Other

## 2020-08-12 ENCOUNTER — Inpatient Hospital Stay (HOSPITAL_COMMUNITY): Payer: Medicare Other

## 2020-08-12 ENCOUNTER — Inpatient Hospital Stay (HOSPITAL_COMMUNITY)
Admission: EM | Admit: 2020-08-12 | Discharge: 2020-08-20 | DRG: 604 | Disposition: A | Discharge status: Home Health | Payer: Medicare Other | Attending: Family Medicine | Admitting: Family Medicine

## 2020-08-12 ENCOUNTER — Encounter (HOSPITAL_COMMUNITY): Payer: Self-pay | Admitting: Emergency Medicine

## 2020-08-12 ENCOUNTER — Emergency Department (HOSPITAL_COMMUNITY): Payer: Medicare Other

## 2020-08-12 ENCOUNTER — Other Ambulatory Visit: Payer: Self-pay

## 2020-08-12 DIAGNOSIS — B9562 Methicillin resistant Staphylococcus aureus infection as the cause of diseases classified elsewhere: Secondary | ICD-10-CM | POA: Diagnosis present

## 2020-08-12 DIAGNOSIS — J309 Allergic rhinitis, unspecified: Secondary | ICD-10-CM | POA: Diagnosis present

## 2020-08-12 DIAGNOSIS — E785 Hyperlipidemia, unspecified: Secondary | ICD-10-CM | POA: Diagnosis present

## 2020-08-12 DIAGNOSIS — Z87891 Personal history of nicotine dependence: Secondary | ICD-10-CM | POA: Diagnosis not present

## 2020-08-12 DIAGNOSIS — Z8673 Personal history of transient ischemic attack (TIA), and cerebral infarction without residual deficits: Secondary | ICD-10-CM | POA: Diagnosis not present

## 2020-08-12 DIAGNOSIS — F0280 Dementia in other diseases classified elsewhere without behavioral disturbance: Secondary | ICD-10-CM | POA: Diagnosis not present

## 2020-08-12 DIAGNOSIS — D62 Acute posthemorrhagic anemia: Secondary | ICD-10-CM | POA: Diagnosis present

## 2020-08-12 DIAGNOSIS — R58 Hemorrhage, not elsewhere classified: Secondary | ICD-10-CM | POA: Diagnosis present

## 2020-08-12 DIAGNOSIS — Z794 Long term (current) use of insulin: Secondary | ICD-10-CM

## 2020-08-12 DIAGNOSIS — Z833 Family history of diabetes mellitus: Secondary | ICD-10-CM

## 2020-08-12 DIAGNOSIS — R001 Bradycardia, unspecified: Secondary | ICD-10-CM | POA: Diagnosis present

## 2020-08-12 DIAGNOSIS — J9811 Atelectasis: Secondary | ICD-10-CM

## 2020-08-12 DIAGNOSIS — R7881 Bacteremia: Secondary | ICD-10-CM | POA: Diagnosis not present

## 2020-08-12 DIAGNOSIS — J384 Edema of larynx: Secondary | ICD-10-CM | POA: Diagnosis present

## 2020-08-12 DIAGNOSIS — Z888 Allergy status to other drugs, medicaments and biological substances status: Secondary | ICD-10-CM

## 2020-08-12 DIAGNOSIS — D66 Hereditary factor VIII deficiency: Secondary | ICD-10-CM | POA: Diagnosis present

## 2020-08-12 DIAGNOSIS — R061 Stridor: Secondary | ICD-10-CM | POA: Diagnosis present

## 2020-08-12 DIAGNOSIS — J9601 Acute respiratory failure with hypoxia: Secondary | ICD-10-CM | POA: Diagnosis present

## 2020-08-12 DIAGNOSIS — Z79899 Other long term (current) drug therapy: Secondary | ICD-10-CM

## 2020-08-12 DIAGNOSIS — R1310 Dysphagia, unspecified: Secondary | ICD-10-CM | POA: Diagnosis present

## 2020-08-12 DIAGNOSIS — D689 Coagulation defect, unspecified: Secondary | ICD-10-CM

## 2020-08-12 DIAGNOSIS — E669 Obesity, unspecified: Secondary | ICD-10-CM | POA: Diagnosis present

## 2020-08-12 DIAGNOSIS — E1165 Type 2 diabetes mellitus with hyperglycemia: Secondary | ICD-10-CM | POA: Diagnosis present

## 2020-08-12 DIAGNOSIS — E119 Type 2 diabetes mellitus without complications: Secondary | ICD-10-CM | POA: Diagnosis not present

## 2020-08-12 DIAGNOSIS — J988 Other specified respiratory disorders: Secondary | ICD-10-CM | POA: Diagnosis not present

## 2020-08-12 DIAGNOSIS — D75838 Other thrombocytosis: Secondary | ICD-10-CM | POA: Diagnosis present

## 2020-08-12 DIAGNOSIS — J9690 Respiratory failure, unspecified, unspecified whether with hypoxia or hypercapnia: Secondary | ICD-10-CM | POA: Diagnosis not present

## 2020-08-12 DIAGNOSIS — Z8614 Personal history of Methicillin resistant Staphylococcus aureus infection: Secondary | ICD-10-CM

## 2020-08-12 DIAGNOSIS — E1149 Type 2 diabetes mellitus with other diabetic neurological complication: Secondary | ICD-10-CM | POA: Diagnosis not present

## 2020-08-12 DIAGNOSIS — Z20822 Contact with and (suspected) exposure to covid-19: Secondary | ICD-10-CM | POA: Diagnosis present

## 2020-08-12 DIAGNOSIS — D589 Hereditary hemolytic anemia, unspecified: Secondary | ICD-10-CM | POA: Diagnosis present

## 2020-08-12 DIAGNOSIS — G309 Alzheimer's disease, unspecified: Secondary | ICD-10-CM | POA: Diagnosis present

## 2020-08-12 DIAGNOSIS — T380X5A Adverse effect of glucocorticoids and synthetic analogues, initial encounter: Secondary | ICD-10-CM | POA: Diagnosis not present

## 2020-08-12 DIAGNOSIS — I10 Essential (primary) hypertension: Secondary | ICD-10-CM | POA: Diagnosis not present

## 2020-08-12 DIAGNOSIS — D649 Anemia, unspecified: Secondary | ICD-10-CM | POA: Diagnosis not present

## 2020-08-12 DIAGNOSIS — D75839 Thrombocytosis, unspecified: Secondary | ICD-10-CM | POA: Diagnosis not present

## 2020-08-12 DIAGNOSIS — D500 Iron deficiency anemia secondary to blood loss (chronic): Secondary | ICD-10-CM | POA: Diagnosis present

## 2020-08-12 DIAGNOSIS — Z8249 Family history of ischemic heart disease and other diseases of the circulatory system: Secondary | ICD-10-CM

## 2020-08-12 DIAGNOSIS — Z7982 Long term (current) use of aspirin: Secondary | ICD-10-CM

## 2020-08-12 DIAGNOSIS — Z6832 Body mass index (BMI) 32.0-32.9, adult: Secondary | ICD-10-CM

## 2020-08-12 DIAGNOSIS — D6851 Activated protein C resistance: Secondary | ICD-10-CM | POA: Diagnosis not present

## 2020-08-12 DIAGNOSIS — S100XXA Contusion of throat, initial encounter: Principal | ICD-10-CM | POA: Diagnosis present

## 2020-08-12 DIAGNOSIS — R791 Abnormal coagulation profile: Secondary | ICD-10-CM | POA: Diagnosis not present

## 2020-08-12 DIAGNOSIS — Z560 Unemployment, unspecified: Secondary | ICD-10-CM

## 2020-08-12 DIAGNOSIS — D72829 Elevated white blood cell count, unspecified: Secondary | ICD-10-CM | POA: Diagnosis not present

## 2020-08-12 LAB — I-STAT ARTERIAL BLOOD GAS, ED
Acid-Base Excess: 5 mmol/L — ABNORMAL HIGH (ref 0.0–2.0)
Bicarbonate: 28 mmol/L (ref 20.0–28.0)
Calcium, Ion: 1.17 mmol/L (ref 1.15–1.40)
HCT: 23 % — ABNORMAL LOW (ref 36.0–46.0)
Hemoglobin: 7.8 g/dL — ABNORMAL LOW (ref 12.0–15.0)
O2 Saturation: 100 %
Potassium: 3.4 mmol/L — ABNORMAL LOW (ref 3.5–5.1)
Sodium: 138 mmol/L (ref 135–145)
TCO2: 29 mmol/L (ref 22–32)
pCO2 arterial: 33.9 mmHg (ref 32.0–48.0)
pH, Arterial: 7.526 — ABNORMAL HIGH (ref 7.350–7.450)
pO2, Arterial: 492 mmHg — ABNORMAL HIGH (ref 83.0–108.0)

## 2020-08-12 LAB — BASIC METABOLIC PANEL
Anion gap: 15 (ref 5–15)
BUN: 11 mg/dL (ref 8–23)
CO2: 23 mmol/L (ref 22–32)
Calcium: 9.4 mg/dL (ref 8.9–10.3)
Chloride: 99 mmol/L (ref 98–111)
Creatinine, Ser: 0.68 mg/dL (ref 0.44–1.00)
GFR, Estimated: >60 mL/min (ref >60)
Glucose, Bld: 175 mg/dL — ABNORMAL HIGH (ref 70–99)
Potassium: 3.4 mmol/L — ABNORMAL LOW (ref 3.5–5.1)
Sodium: 137 mmol/L (ref 135–145)

## 2020-08-12 LAB — CBC WITH DIFFERENTIAL/PLATELET
Abs Immature Granulocytes: 0.08 10*3/uL — ABNORMAL HIGH (ref 0.00–0.07)
Basophils Absolute: 0.1 10*3/uL (ref 0.0–0.1)
Basophils Relative: 0 %
Eosinophils Absolute: 0.2 10*3/uL (ref 0.0–0.5)
Eosinophils Relative: 2 %
HCT: 25 % — ABNORMAL LOW (ref 36.0–46.0)
Hemoglobin: 7 g/dL — ABNORMAL LOW (ref 12.0–15.0)
Immature Granulocytes: 1 %
Lymphocytes Relative: 12 %
Lymphs Abs: 1.6 10*3/uL (ref 0.7–4.0)
MCH: 27.9 pg (ref 26.0–34.0)
MCHC: 28 g/dL — ABNORMAL LOW (ref 30.0–36.0)
MCV: 99.6 fL (ref 80.0–100.0)
Monocytes Absolute: 1.2 10*3/uL — ABNORMAL HIGH (ref 0.1–1.0)
Monocytes Relative: 9 %
Neutro Abs: 10 10*3/uL — ABNORMAL HIGH (ref 1.7–7.7)
Neutrophils Relative %: 76 %
Platelets: 556 10*3/uL — ABNORMAL HIGH (ref 150–400)
RBC: 2.51 MIL/uL — ABNORMAL LOW (ref 3.87–5.11)
RDW: 16.7 % — ABNORMAL HIGH (ref 11.5–15.5)
WBC: 13.1 10*3/uL — ABNORMAL HIGH (ref 4.0–10.5)
nRBC: 0.3 % — ABNORMAL HIGH (ref 0.0–0.2)

## 2020-08-12 LAB — RESP PANEL BY RT-PCR (FLU A&B, COVID) ARPGX2
Influenza A by PCR: NEGATIVE
Influenza B by PCR: NEGATIVE
SARS Coronavirus 2 by RT PCR: NEGATIVE

## 2020-08-12 LAB — CBG MONITORING, ED: Glucose-Capillary: 152 mg/dL — ABNORMAL HIGH (ref 70–99)

## 2020-08-12 LAB — GLUCOSE, CAPILLARY
Glucose-Capillary: 165 mg/dL — ABNORMAL HIGH (ref 70–99)
Glucose-Capillary: 165 mg/dL — ABNORMAL HIGH (ref 70–99)

## 2020-08-12 LAB — TROPONIN I (HIGH SENSITIVITY)
Troponin I (High Sensitivity): 6 ng/L (ref <18)
Troponin I (High Sensitivity): 6 ng/L (ref <18)

## 2020-08-12 LAB — DIC (DISSEMINATED INTRAVASCULAR COAGULATION)PANEL
D-Dimer, Quant: 7.68 ug/mL-FEU — ABNORMAL HIGH (ref 0.00–0.50)
Fibrinogen: 545 mg/dL — ABNORMAL HIGH (ref 210–475)
INR: 1.1 (ref 0.8–1.2)
Platelets: 391 10*3/uL (ref 150–400)
Prothrombin Time: 14.6 seconds (ref 11.4–15.2)
Smear Review: NONE SEEN
aPTT: 90 seconds — ABNORMAL HIGH (ref 24–36)

## 2020-08-12 LAB — APTT: aPTT: 92 seconds — ABNORMAL HIGH (ref 24–36)

## 2020-08-12 LAB — PROTIME-INR
INR: 1.1 (ref 0.8–1.2)
Prothrombin Time: 14.6 seconds (ref 11.4–15.2)

## 2020-08-12 LAB — ABO/RH: ABO/RH(D): A POS

## 2020-08-12 LAB — MRSA PCR SCREENING: MRSA by PCR: NEGATIVE

## 2020-08-12 MED ORDER — INSULIN ASPART 100 UNIT/ML ~~LOC~~ SOLN
0.0000 [IU] | SUBCUTANEOUS | Status: DC
  Administered 2020-08-12 – 2020-08-13 (×4): 3 [IU] via SUBCUTANEOUS
  Administered 2020-08-13 (×2): 5 [IU] via SUBCUTANEOUS
  Administered 2020-08-13 (×3): 3 [IU] via SUBCUTANEOUS
  Administered 2020-08-14: 5 [IU] via SUBCUTANEOUS
  Administered 2020-08-14: 3 [IU] via SUBCUTANEOUS
  Administered 2020-08-14: 5 [IU] via SUBCUTANEOUS
  Administered 2020-08-14: 3 [IU] via SUBCUTANEOUS
  Administered 2020-08-14: 2 [IU] via SUBCUTANEOUS
  Administered 2020-08-15: 3 [IU] via SUBCUTANEOUS
  Administered 2020-08-15 (×2): 5 [IU] via SUBCUTANEOUS
  Administered 2020-08-15: 3 [IU] via SUBCUTANEOUS
  Administered 2020-08-15: 8 [IU] via SUBCUTANEOUS
  Administered 2020-08-16: 3 [IU] via SUBCUTANEOUS
  Administered 2020-08-16: 2 [IU] via SUBCUTANEOUS
  Administered 2020-08-16 (×2): 5 [IU] via SUBCUTANEOUS
  Administered 2020-08-16: 3 [IU] via SUBCUTANEOUS
  Administered 2020-08-16: 5 [IU] via SUBCUTANEOUS
  Administered 2020-08-17: 2 [IU] via SUBCUTANEOUS

## 2020-08-12 MED ORDER — SUCCINYLCHOLINE CHLORIDE 20 MG/ML IJ SOLN
INTRAMUSCULAR | Status: DC | PRN
  Administered 2020-08-12: 100 mg via INTRAVENOUS

## 2020-08-12 MED ORDER — DOCUSATE SODIUM 100 MG PO CAPS: 100.0000 mg | ORAL_CAPSULE | Freq: Two times a day (BID) | ORAL | Status: DC | PRN

## 2020-08-12 MED ORDER — DEXAMETHASONE SODIUM PHOSPHATE 10 MG/ML IJ SOLN
10.0000 mg | Freq: Once | INTRAMUSCULAR | Status: AC
  Administered 2020-08-12: 10 mg via INTRAVENOUS
  Filled 2020-08-12: qty 1

## 2020-08-12 MED ORDER — SODIUM CHLORIDE 0.9 % IV SOLN: INTRAVENOUS | Status: DC | PRN

## 2020-08-12 MED ORDER — DOCUSATE SODIUM 50 MG/5ML PO LIQD
100.0000 mg | Freq: Two times a day (BID) | ORAL | Status: DC
  Administered 2020-08-12 – 2020-08-16 (×8): 100 mg
  Filled 2020-08-12 (×11): qty 10

## 2020-08-12 MED ORDER — IOHEXOL 300 MG/ML  SOLN
75.0000 mL | Freq: Once | INTRAMUSCULAR | Status: AC | PRN
  Administered 2020-08-12: 75 mL via INTRAVENOUS

## 2020-08-12 MED ORDER — LACTATED RINGERS IV SOLN: INTRAVENOUS | Status: DC

## 2020-08-12 MED ORDER — PANTOPRAZOLE SODIUM 40 MG IV SOLR
40.0000 mg | Freq: Two times a day (BID) | INTRAVENOUS | Status: DC
  Administered 2020-08-12 – 2020-08-17 (×11): 40 mg via INTRAVENOUS
  Filled 2020-08-12 (×11): qty 40

## 2020-08-12 MED ORDER — POLYETHYLENE GLYCOL 3350 17 G PO PACK: 17.0000 g | PACK | Freq: Every day | ORAL | Status: DC | PRN

## 2020-08-12 MED ORDER — RACEPINEPHRINE HCL 2.25 % IN NEBU
0.5000 mL | INHALATION_SOLUTION | Freq: Once | RESPIRATORY_TRACT | Status: DC
  Filled 2020-08-12: qty 0.5

## 2020-08-12 MED ORDER — PROPOFOL 1000 MG/100ML IV EMUL
INTRAVENOUS | Status: AC
  Administered 2020-08-12: 35 ug/kg/min via INTRAVENOUS
  Filled 2020-08-12: qty 100

## 2020-08-12 MED ORDER — FENTANYL CITRATE (PF) 100 MCG/2ML IJ SOLN
50.0000 ug | INTRAMUSCULAR | Status: DC | PRN
  Administered 2020-08-12: 50 ug via INTRAVENOUS
  Filled 2020-08-12 (×2): qty 2

## 2020-08-12 MED ORDER — DEXAMETHASONE SODIUM PHOSPHATE 4 MG/ML IJ SOLN
4.0000 mg | Freq: Four times a day (QID) | INTRAMUSCULAR | Status: DC
  Administered 2020-08-12 – 2020-08-14 (×7): 4 mg via INTRAVENOUS
  Filled 2020-08-12 (×7): qty 1

## 2020-08-12 MED ORDER — LIDOCAINE HCL (CARDIAC) PF 100 MG/5ML IV SOSY: PREFILLED_SYRINGE | INTRAVENOUS | Status: DC | PRN

## 2020-08-12 MED ORDER — FENTANYL CITRATE (PF) 100 MCG/2ML IJ SOLN
50.0000 ug | INTRAMUSCULAR | Status: DC | PRN
  Administered 2020-08-12: 50 ug via INTRAVENOUS

## 2020-08-12 MED ORDER — METHYLPREDNISOLONE SODIUM SUCC 125 MG IJ SOLR: 125.0000 mg | Freq: Once | INTRAMUSCULAR | Status: DC

## 2020-08-12 MED ORDER — PROPOFOL 10 MG/ML IV BOLUS
INTRAVENOUS | Status: DC | PRN
  Administered 2020-08-12: 60 mg via INTRAVENOUS
  Administered 2020-08-12: 50 mg via INTRAVENOUS

## 2020-08-12 MED ORDER — ORAL CARE MOUTH RINSE
15.0000 mL | OROMUCOSAL | Status: DC
  Administered 2020-08-12 – 2020-08-16 (×37): 15 mL via OROMUCOSAL

## 2020-08-12 MED ORDER — PROPOFOL 1000 MG/100ML IV EMUL
0.0000 ug/kg/min | INTRAVENOUS | Status: DC
  Administered 2020-08-12 – 2020-08-13 (×4): 50 ug/kg/min via INTRAVENOUS
  Administered 2020-08-13: 40 ug/kg/min via INTRAVENOUS
  Administered 2020-08-13 – 2020-08-14 (×4): 50 ug/kg/min via INTRAVENOUS
  Administered 2020-08-14 (×4): 40 ug/kg/min via INTRAVENOUS
  Administered 2020-08-14 (×2): 50 ug/kg/min via INTRAVENOUS
  Administered 2020-08-15 (×2): 40 ug/kg/min via INTRAVENOUS
  Administered 2020-08-15 – 2020-08-16 (×4): 25 ug/kg/min via INTRAVENOUS
  Filled 2020-08-12 (×21): qty 100

## 2020-08-12 MED ORDER — POLYETHYLENE GLYCOL 3350 17 G PO PACK
17.0000 g | PACK | Freq: Every day | ORAL | Status: DC
  Administered 2020-08-13 – 2020-08-16 (×4): 17 g
  Filled 2020-08-12 (×5): qty 1

## 2020-08-12 MED ORDER — CHLORHEXIDINE GLUCONATE CLOTH 2 % EX PADS
6.0000 | MEDICATED_PAD | Freq: Every day | CUTANEOUS | Status: DC
  Administered 2020-08-12 – 2020-08-20 (×8): 6 via TOPICAL

## 2020-08-12 MED ORDER — PANTOPRAZOLE SODIUM 40 MG IV SOLR: 40.0000 mg | Freq: Every day | INTRAVENOUS | Status: DC

## 2020-08-12 MED ORDER — CHLORHEXIDINE GLUCONATE 0.12% ORAL RINSE (MEDLINE KIT)
15.0000 mL | Freq: Two times a day (BID) | OROMUCOSAL | Status: DC
  Administered 2020-08-12 – 2020-08-16 (×8): 15 mL via OROMUCOSAL

## 2020-08-12 NOTE — ED Provider Notes (Signed)
[facility] EMERGENCY DEPARTMENT Provider Note   CSN: [id number] Arrival date & time: 08/12/20  1219     History Chief Complaint  Patient presents with  . Shortness of Breath    Paula Reed is a 75 y.o. female history of hypertension, Alzheimer's dementia, diabetes presenting to the ED with a chief complaint of back pain and shortness of breath.  History is provided by patient and husband over the phone.  Patient's main complaint today is her lower back pain that worsened today.  She has had pain on and off for several months.  No pain currently.  Patient reports being congested for the past few days.  Denies any shortness of breath, chest pain. She is unsure how long she has been stridorous today. History is limited secondary to patient's dementia and respiratory distress.  HPI     Past Medical History:  Diagnosis Date  . Allergic rhinitis   . Diabetes ([place])   . Diabetes mellitus without complication ([place])   . Hemorrhoids   . Hyperlipidemia   . Hypertension   . Probable Alzheimer's dementia without behavioral disturbance 09/15/2015   May 2017 [facility] score 8/30   . Seizures [facility])     Patient Active Problem List   Diagnosis Date Noted  . Stridor 08/12/2020  . Laryngeal edema   . Pain and swelling of right forearm 06/14/2020  . Pain due to onychomycosis of toenails of both feet 07/26/2019  . History of chronic cough 02/10/2019  . Ischemic cerebrovascular accident (CVA) ([place]) 08/26/2018  . MRSA bacteremia 06/01/2018  . Allergic rhinitis 05/12/2018  . Seizures ([place]) 06/28/2017  . Probable Alzheimer's dementia without behavioral disturbance 09/15/2015  . Rash and nonspecific skin eruption 12/18/2014  . Rash 09/20/2014  . Bruit of right carotid artery 03/26/2014  . Trigger finger 11/03/2012  . Heart murmur 04/22/2012  . Diabetes mellitus, type II ([place]) 02/02/2012  . Hypertension 02/02/2012  . Hyperlipidemia 02/02/2012  . Routine adult health  maintenance 02/02/2012    Past Surgical History:  Procedure Laterality Date  . carpel tunnel release    . ECTOPIC PREGNANCY SURGERY    . TEE WITHOUT CARDIOVERSION N/A 06/07/2018   Procedure: TRANSESOPHAGEAL ECHOCARDIOGRAM (TEE);  Surgeon: Lelon Perla, MD;  Location: [facility] ENDOSCOPY;  Service: Cardiovascular;  Laterality: N/A;     OB History   No obstetric history on file.     Family History  Problem Relation Age of Onset  . Heart disease Father   . Hypertension Father   . Hypertension Mother   . Colon cancer Other        aunt    Social History   Tobacco Use  . Smoking status: Former Research scientist (life sciences)  . Smokeless tobacco: Never Used  Vaping Use  . Vaping Use: Never used  Substance Use Topics  . Alcohol use: No  . Drug use: No    Home Medications Prior to Admission medications   Medication Sig Start Date End Date Taking? Authorizing Provider  amLODipine (NORVASC) 5 MG tablet Take 1 tablet (5 mg total) by mouth at bedtime. 09/05/19   Nuala Alpha, DO  aspirin EC 81 MG tablet Take 81 mg by mouth daily.    [provider]  atorvastatin (LIPITOR) 80 MG tablet TAKE 1 TABLET BY MOUTH DAILY AT 6 PM. 04/29/20   Leeanne Rio, MD  Blood Glucose Monitoring Suppl (ONETOUCH VERIO) w/Device KIT 1 kit by Does not apply route daily. Check sugar twice daily 01/30/19  Leeanne Rio, MD  glucose blood [facility] VERIO) test strip Check sugar twice daily 06/25/17   Leeanne Rio, MD  insulin glargine (LANTUS) 100 UNIT/ML Solostar Pen Inject 15 Units into the skin daily. 06/25/20   Leeanne Rio, MD  Insulin Pen Needle (PEN NEEDLES) 32G X 4 MM MISC Inject 1 Dose into the skin as directed. 07/20/19   Zenia Resides, MD  Insulin Syringe-Needle U-100 (BD INSULIN SYRINGE U/F) 31G X 5/16" 0.3 ML MISC USE TO INJECT LANTUS DAILY 09/22/18   Leeanne Rio, MD  [facility] DELICA LANCETS 83U MISC Check sugar twice daily 06/25/17   Leeanne Rio, MD   Semaglutide,0.25 or 0.5MG/DOS, (OZEMPIC, 0.25 OR 0.5 MG/DOSE,) 2 MG/1.5ML SOPN Inject 0.25 mg into the skin once a week. (Thursdays) 07/20/19   Zenia Resides, MD  triamcinolone ointment (KENALOG) 0.5 % Apply 1 application topically 2 (two) times daily as needed. 06/13/20   Mullis, Kiersten P, DO    Allergies    Metformin and related and Chlorthalidone  Review of Systems   Review of Systems  Unable to perform ROS: Dementia  Musculoskeletal: Positive for back pain and myalgias.    Physical Exam Updated Vital Signs BP (!) 189/132   Pulse (!) 141   Temp 98.9 F (37.2 C) (Oral)   Resp (!) 22   Ht _0  (1.676 m)   SpO2 100%   BMI 32.36 kg/m   Physical Exam Vitals and nursing note reviewed.  Constitutional:      General: She is not in acute distress.    Appearance: She is well-developed. She is obese.  HENT:     Head: Normocephalic and atraumatic.     Nose: Nose normal.     Mouth/Throat:     Comments: Blood noted to posterior oropharynx. Eyes:     General: No scleral icterus.       Left eye: No discharge.     Conjunctiva/sclera: Conjunctivae normal.  Cardiovascular:     Rate and Rhythm: Regular rhythm. Tachycardia present.     Heart sounds: Normal heart sounds. No murmur heard. No friction rub. No gallop.   Pulmonary:     Effort: Pulmonary effort is normal. Tachypnea present. No respiratory distress.     Breath sounds: Normal breath sounds. Stridor present.     Comments: Stridor noted.  Patient protecting airway. Abdominal:     General: Bowel sounds are normal. There is no distension.     Palpations: Abdomen is soft.     Tenderness: There is no abdominal tenderness. There is no guarding.  Musculoskeletal:        General: Normal range of motion.     Cervical back: Normal range of motion and neck supple.     Comments: Tenderness palpation of the lumbar spine at the paraspinal musculature.  Moving all extremities without difficulty.  Normal sensation to light touch of  bilateral upper and lower extremities.  Skin:    General: Skin is warm and dry.     Findings: No rash.  Neurological:     Mental Status: She is alert.     Motor: No abnormal muscle tone.     Coordination: Coordination normal.     ED Results / Procedures / Treatments   Labs (all labs ordered are listed, but only abnormal results are displayed) Labs Reviewed  CBC WITH DIFFERENTIAL/PLATELET - Abnormal; Notable for the following components:      Result Value   WBC 13.1 (*)  RBC 2.51 (*)    Hemoglobin 7.0 (*)    HCT 25.0 (*)    MCHC 28.0 (*)    RDW 16.7 (*)    Platelets 556 (*)    nRBC 0.3 (*)    Neutro Abs 10.0 (*)    Monocytes Absolute 1.2 (*)    Abs Immature Granulocytes 0.08 (*)    All other components within normal limits  BASIC METABOLIC PANEL - Abnormal; Notable for the following components:   Potassium 3.4 (*)    Glucose, Bld 175 (*)    All other components within normal limits  RESP PANEL BY RT-PCR (FLU A&B, COVID) ARPGX2  CULTURE, BLOOD (ROUTINE X 2)  CULTURE, BLOOD (ROUTINE X 2)  CULTURE, RESPIRATORY W GRAM STAIN  APTT  PROTIME-INR  DIC (DISSEMINATED INTRAVASCULAR COAGULATION)PANEL  CBG MONITORING, ED  TYPE AND SCREEN  TROPONIN I (HIGH SENSITIVITY)  TROPONIN I (HIGH SENSITIVITY)    EKG EKG Interpretation  Date/Time:  Monday August 12 2020 12:31:56 EDT Ventricular Rate:  114 PR Interval:  150 QRS Duration: 76 QT Interval:  346 QTC Calculation: 476 R Axis:   -9 Text Interpretation: Sinus tachycardia Left ventricular hypertrophy with repolarization abnormality ( R in aVL , Romhilt-Estes ) Inferior infarct , age undetermined Abnormal ECG similar to Feb 2020 Confirmed by Sherwood Gambler [phone]) on 08/12/2020 1:17:17 PM   Radiology DG Chest Portable 1 View  Result Date: 08/12/2020 CLINICAL DATA:  Shortness of breath and cough for 2-3 days. EXAM: PORTABLE CHEST 1 VIEW COMPARISON:  Single-view of the chest 05/31/2018. FINDINGS: Lungs clear. Heart size  normal. Aortic atherosclerosis. No pneumothorax or pleural fluid. No acute or focal bony abnormality. IMPRESSION: No acute disease. Aortic Atherosclerosis (ICD10-I70.0). Electronically Signed   By: Inge Rise M.D.   On: 08/12/2020 13:11    Procedures Procedures   Medications Ordered in ED Medications  docusate (COLACE) 50 MG/5ML liquid 100 mg (has no administration in time range)  polyethylene glycol (MIRALAX / GLYCOLAX) packet 17 g (has no administration in time range)  propofol (DIPRIVAN) 1000 MG/100ML infusion (has no administration in time range)  fentaNYL (SUBLIMAZE) injection 50 mcg (has no administration in time range)  fentaNYL (SUBLIMAZE) injection 50-200 mcg (has no administration in time range)  lactated ringers infusion (has no administration in time range)  docusate sodium (COLACE) capsule 100 mg (has no administration in time range)  polyethylene glycol (MIRALAX / GLYCOLAX) packet 17 g (has no administration in time range)  insulin aspart (novoLOG) injection 0-15 Units (has no administration in time range)  dexamethasone (DECADRON) injection 4 mg (has no administration in time range)  propofol (DIPRIVAN) 1000 MG/100ML infusion (has no administration in time range)  pantoprazole (PROTONIX) injection 40 mg (has no administration in time range)  dexamethasone (DECADRON) injection 10 mg (10 mg Intravenous Given 08/12/20 1415)    ED Course  I have reviewed the triage vital signs and the nursing notes.  Pertinent labs & imaging results that were available during my care of the patient were reviewed by me and considered in my medical decision making (see chart for details).  Clinical Course as of 08/12/20 1511  Mon Aug 12, 2020  1317 Consult to ENT Dr. Marcelline Deist who will evaluate the patient at bedside. [HK]  1409 Pulse Rate(!): 109 [HK]  1418 WBC(!): 13.1 [HK]  1439 Hemoglobin(!): 7.0 [HK]  1439 Resp Panel by RT-PCR (Flu A&B, Covid) Nasopharyngeal Swab Negative  [HK]   1504 Patient intubated by anesthesiology. [HK]    Clinical Course  User Index [HK] Delia Heady, PA-C   MDM Rules/Calculators/A&P                          75 year old female with a history of hypertension, dementia, diabetes presenting to the ED with a chief complaint of back pain.  Patient found to be stridorous on triage and sent to the room immediately.  She states that her main complaint is her chronic lower back pain that worsened today.  However she states that since arrival to the ER room she is pain-free.  Patient with noisy upper airway sounds but denies any shortness of breath.  She is not hypoxic but she is tachycardic to the 120s.  She denies any chest pain.  Questionable "knot" under her chin for the past few days that her husband mentioned but I was unable to appreciate this on my exam.  There is bleeding noted in the posterior oropharynx.  ENT was consulted who evaluated the patient at the bedside with direct scoping.  He states that there is significant edema noted in the larynx and requesting intubation admission to the ICU.  We have given her Decadron here.  Her chest x-ray is without any acute findings.  EKG shows sinus tachycardia, similar to priors.  CBC with a slight leukocytosis of 13.  Her hemoglobin is now 7, was 7.5 at her PCP visit about 4 days ago.  Will obtain additional coag panel.  Patient evaluated at bedside by ENT, scope concerning for edema and hematoma? Requesting intubation. She was intubated by anesthesiology at bedside and will be admitted to ICU for further workup. She does have several areas of bruising on her body as well, including the submandibular area.    Portions of this note were generated with [employer]. Dictation errors may occur despite best attempts at proofreading.  Final Clinical Impression(s) / ED Diagnoses Final diagnoses:  Stridor  Anemia, unspecified type  Compromised airway    Rx / DC Orders ED Discharge Orders     None       Delia Heady, PA-C 08/12/20 1512    Sherwood Gambler, MD 08/12/20 1621

## 2020-08-12 NOTE — Anesthesia Procedure Notes (Signed)
Procedure Name: Intubation Date/Time: 08/12/2020 2:59 PM Performed by: Alease Medina, CRNA Pre-anesthesia Checklist: Patient identified, Emergency Drugs available, Suction available and Patient being monitored Patient Re-evaluated:Patient Re-evaluated prior to induction Oxygen Delivery Method: Circle system utilized Preoxygenation: Pre-oxygenation with 100% oxygen Induction Type: IV induction Ventilation: Mask ventilation without difficulty and Two handed mask ventilation required Laryngoscope Size: Glidescope and 4 Grade View: Grade I Tube type: Oral Tube size: 7.0 mm Number of attempts: 1 Airway Equipment and Method: Stylet and Oral airway Placement Confirmation: ETT inserted through vocal cords under direct vision,  positive ETCO2,  breath sounds checked- equal and bilateral and CO2 detector Secured at: 21 cm Tube secured with: Tape Dental Injury: Teeth and Oropharynx as per pre-operative assessment  Difficulty Due To: Difficulty was anticipated

## 2020-08-12 NOTE — ED Triage Notes (Signed)
Pt arrived to triage with stridor.  Reports SOB x 2-3 days with a cough. Denies sore throat.  Also reports lower back pain.

## 2020-08-12 NOTE — ED Provider Notes (Signed)
.  Critical Care Performed by: Pricilla Loveless, MD Authorized by: Pricilla Loveless, MD   Critical care provider statement:    Critical care time (minutes):  45   Critical care time was exclusive of:  Separately billable procedures and treating other patients   Critical care was necessary to treat or prevent imminent or life-threatening deterioration of the following conditions:  Respiratory failure   Critical care was time spent personally by me on the following activities:  Discussions with consultants, evaluation of patient's response to treatment, examination of patient, ordering and performing treatments and interventions, ordering and review of laboratory studies, ordering and review of radiographic studies, pulse oximetry, re-evaluation of patient's condition, obtaining history from patient or surrogate and review of old charts Ultrasound ED Peripheral IV (Provider)  Date/Time: 08/12/2020 4:10 PM Performed by: Pricilla Loveless, MD Authorized by: Pricilla Loveless, MD   Procedure details:    Indications: poor IV access     Skin Prep: chlorhexidine gluconate     Location:  Left AC   Angiocath:  20 G   Bedside Ultrasound Guided: Yes     Patient tolerated procedure without complications: Yes     Dressing applied: Yes      Patient presents with stridor and some increased work of breathing but no respiratory distress.  History is limited due to her dementia but later on it is found out that she had some difficulty swallowing yesterday that has progressed to the point of some difficulty breathing today.  Has a lot of transmitted upper airway sounds.  ENT scoped at the bedside and she has a definitively abnormal oropharynx and swollen cords.  Due to this anesthesia was consulted and they have intubated her at the bedside, which fortunately went smoothly.  ICU will admit.  Will need CT scans from neck to abdomen and we have given her dexamethasone IV.   Pricilla Loveless, MD 08/12/20 [phone]

## 2020-08-12 NOTE — H&P (Addendum)
Subjective:     Addendum:  Patient intubated by anesthesia without problem.  I noted patient having bruising on her left arm and hand as well as quickly developing bruising along her left mandible following mask ventilation.  On review of labs, she has an elevated platelet count, low Hb, and increased monocyte count.  Coags are still pending.  In addition to a clotting workup, I communicated my concerns about some sort of lymphoproliferative process.  Will defer to medicine colleagues regarding workup.    Paula Reed is a 75 y.o. female who presents for evaluation of difficulty breathing which has been progressive over the past 12 to 24 hours.  It all started with dysphagia.  Most of the history comes from the husband who is in the room during my encounter.  Patient has a history of diabetes and Alzheimer's dementia.  No history of trauma to the throat or neck.  No recent medication changes.  She is not anticoagulated.  Review of Systems Pertinent items are noted in HPI.    Objective:    BP (!) 159/74   Pulse (!) 111   Temp 98.9 F (37.2 C) (Oral)   Resp (!) 21   SpO2 98%     Patient is lying supine in bed in some mild respiratory distress.  She is able to speak in sentences but had some difficulty with breathiness.  Her husband is in the room with her. Pupils equal round and reactive to light/extraocular movements intact Cranial nerves II through XII intact/no focal motor or sensory deficits noted on thorough neurologic exam Neck without swelling or asymmetry/trachea midline/no salivary gland masses or lesions Right nasal septal deviation mild without nasopharyngeal mass or polyp Normal tongue mobility without swelling of the soft palate or floor of mouth/upper denture in place Pinna normal without mastoid tenderness/no nystagmus Chest symmetric expansions bilaterally without use of accessory muscles  FFOL -  I passed the flexible fiberoptic scope through the right naris.  The  nasopharynx was symmetric and free of mass.  The supraglottis was symmetrically swollen and discolored suggestive of a hematoma of some sort.  The arytenoids both appeared to move although the true vocal cords were difficult to see due to the discoloration and swelling.  There was no asymmetry to her exam.  Assessment:   Laryngeal edema of unknown etiology   Plan:   I immediately discussed my findings with the ER attending.  This patient has significant symmetric swelling which does not appear to be traumatic, infectious, or allergic.  It almost looks as if she had a retropharyngeal hematoma of some sort which is now spread into the interarytenoid space and up onto the arytenoids.  I recommended that she be intubated for airway protection after which she can undergo CT scan of the neck.  Our anesthesiology colleagues have come down to the ER and will intubate the patient.  I discussed my findings and recommendations with her husband.  After intubation, the patient will undergo

## 2020-08-12 NOTE — Progress Notes (Signed)
RT transported patient from ED to 33M without complications.

## 2020-08-12 NOTE — ED Triage Notes (Signed)
Emergency Medicine Provider Triage Evaluation Note  Paula Reed , a 75 y.o. female  was evaluated in triage.  Pt complains of sob, unable to tell me how long she has been sob. Has had cough, no chest pain  Review of Systems  Positive: Sob, cough Negative: Chest pain  Physical Exam  BP (!) 173/135 (BP Location: Right Arm)   Pulse (!) 111   Temp 98.9 F (37.2 C) (Oral)   Resp (!) 24   SpO2 98%  Gen:   Awake HEENT:  Atraumatic  Resp:  Increased effort, wheezing Cardiac:  tachycardic Abd:   Nondistended, MSK:   Moves extremities without difficulty  Neuro:  Speech clear   Medical Decision Making  Medically screening exam initiated at 12:35 PM.  Appropriate orders placed.  Paula Reed was informed that the remainder of the evaluation will be completed by another provider, this initial triage assessment does not replace that evaluation, and the importance of remaining in the ED until their evaluation is complete.  Clinical Impression   75 y/o f with sob  MSE was initiated and I personally evaluated the patient and placed orders (if any) at  12:35 PM on August 12, 2020.  The patient appears stable so that the remainder of the MSE may be completed by another provider.    Karrie Meres, PA-C 08/12/20 1235

## 2020-08-12 NOTE — Progress Notes (Signed)
RT at bedside to assess pt prior to administering Racepinephrine. Pt in no apparent distress but does have autoable stridor. Per Dr. Criss Alvine, hold off on racepinephrine until ENT assesses pt. RT made RN aware.

## 2020-08-12 NOTE — ED Notes (Signed)
Pt returned from CT scan, tolerated well.

## 2020-08-12 NOTE — H&P (Addendum)
NAME:  Paula Reed, MRN:  [id number], DOB:  Feb 18, 1946, LOS: 0 ADMISSION DATE:  08/12/2020, CONSULTATION DATE:  08/12/20 REFERRING MD:  Regenia Skeeter, CHIEF COMPLAINT:  SOB   History of Present Illness:  75 year old woman presenting with increasing stridor preceded by dysphagia. No trauma.  Seen by ENT in ER with laryngoscopy showing posterior wall swelling with perhaps ecchymoses indicated ?retropharyngeal hematoma.  Anesthesia and ENT planning on careful intubation either in ER or OR.  PCCM consulted for admission  Pertinent  Medical History  Seizures Low grade Alzheimer's HTN HLD DM2  Significant Hospital Events: Including procedures, antibiotic start and stop dates in addition to other pertinent events   . 4/18 admit/intubation  Interim History / Subjective:  consulted  Objective   Blood pressure (!) 156/136, pulse (!) 110, temperature 98.9 F (37.2 C), temperature source Oral, resp. rate (!) 26, SpO2 95 %.       No intake or output data in the 24 hours ending 08/12/20 1455 There were no vitals filed for this visit.  Examination: General: elderly woman in moderate distress HENT: trachea midline, stridor auscultated bilaterally, submandibular bruising Lungs: transmitted upper airway sounds, +accessory muscle use Cardiovascular: tachycardic, ext warm Abdomen: soft, +BS Extremities: no edema Neuro: she is moving all 4 ext Skin: no rashes, has left palmar ?ecchymosis in median artery distribution  Labs/imaging that I havepersonally reviewed  (right click and "Reselect all SmartList Selections" daily)  Hgb 7, last available H/H 2020 was Hgb 12  Resolved Hospital Problem list   N/A  Assessment & Plan:  Acute stridor, rule out RP abscess vs. Hematoma; thought less likely to be angioedema given appearance. Acute hypoxemic respiratory failure secondary to compromised airway Anemia- question chronicity, assume acute for now  - Intubation to be performed by ER/Anesthesia  with ENT backup - Thereafter get neck/chest/abd/pelvis imaging to see what we are dealing with - LTVV with VAP prevention bundle - Hold all AC - Empiric dexamethasone okay - Husband updated by EDP  Best practice (right click and "Reselect all SmartList Selections" daily)  Diet:  NPO Pain/Anxiety/Delirium protocol (if indicated): Yes (RASS goal -2) VAP protocol (if indicated): Yes DVT prophylaxis: Contraindicated GI prophylaxis: PPI Glucose control:  SSI Yes Central venous access:  N/A Arterial line:  N/A Foley:  N/A Mobility:  bed rest  PT consulted: N/A Last date of multidisciplinary goals of care discussion [n/a] Code Status:  full code Disposition: ICU  Labs   CBC: Recent Labs  Lab 08/08/20 1015 08/12/20 1232  WBC 11.0* 13.1*  NEUTROABS  --  10.0*  HGB 7.5* 7.0*  HCT 25.2* 25.0*  MCV 93 99.6  PLT 527* 556*    Basic Metabolic Panel: Recent Labs  Lab 08/12/20 1232  NA 137  K 3.4*  CL 99  CO2 23  GLUCOSE 175*  BUN 11  CREATININE 0.68  CALCIUM 9.4   GFR: Estimated Creatinine Clearance: 71.1 mL/min (by C-G formula based on SCr of 0.68 mg/dL). Recent Labs  Lab 08/08/20 1015 08/12/20 1232  WBC 11.0* 13.1*    Liver Function Tests: No results for input(s): AST, ALT, ALKPHOS, BILITOT, PROT, ALBUMIN in the last 168 hours. No results for input(s): LIPASE, AMYLASE in the last 168 hours. No results for input(s): AMMONIA in the last 168 hours.  ABG    Component Value Date/Time   PHART 7.453 (H) 06/07/2017 0640   PCO2ART 31.8 (L) 06/07/2017 0640   PO2ART 70.9 (L) 06/07/2017 0640   HCO3 21.9 06/07/2017 0640  TCO2 21 (L) 06/05/2017 0856   ACIDBASEDEF 1.5 06/07/2017 0640   O2SAT 94.0 06/07/2017 0640     Coagulation Profile: Recent Labs  Lab 08/08/20 1015  INR 1.0    Cardiac Enzymes: No results for input(s): CKTOTAL, CKMB, CKMBINDEX, TROPONINI in the last 168 hours.  HbA1C: Hemoglobin A1C  Date/Time Value Ref Range Status  06/25/2020 10:45 AM  5.9 (A) 4.0 - 5.6 % Final  04/02/2020 11:40 AM 7.7 (A) 4.0 - 5.6 % Final  06/04/2016 12:00 AM 6.5  Final   HbA1c, POC (controlled diabetic range)  Date/Time Value Ref Range Status  09/05/2019 04:14 PM 8.6 (A) 0.0 - 7.0 % Final  06/23/2019 10:37 AM 12.7 (A) 0.0 - 7.0 % Final    CBG: No results for input(s): GLUCAP in the last 168 hours.  Review of Systems:   Cannot assess, too much distress  Past Medical History:  She,  has a past medical history of Allergic rhinitis, Diabetes ([place]), Diabetes mellitus without complication ([place]), Hemorrhoids, Hyperlipidemia, Hypertension, Probable Alzheimer's dementia without behavioral disturbance (09/15/2015), and Seizures ([place]).   Surgical History:   Past Surgical History:  Procedure Laterality Date  . carpel tunnel release    . ECTOPIC PREGNANCY SURGERY    . TEE WITHOUT CARDIOVERSION N/A 06/07/2018   Procedure: TRANSESOPHAGEAL ECHOCARDIOGRAM (TEE);  Surgeon: Lelon Perla, MD;  Location: [facility] ENDOSCOPY;  Service: Cardiovascular;  Laterality: N/A;     Social History:   reports that she has quit smoking. She has never used smokeless tobacco. She reports that she does not drink alcohol and does not use drugs.   Family History:  Her family history includes Colon cancer in an other family member; Heart disease in her father; Hypertension in her father and mother.   Allergies Allergies  Allergen Reactions  . Metformin And Related Rash  . Chlorthalidone Rash    Per [facility] records, pt may have had a rash rxn to chlorthalidone     Home Medications  Prior to Admission medications   Medication Sig Start Date End Date Taking? Authorizing Provider  amLODipine (NORVASC) 5 MG tablet Take 1 tablet (5 mg total) by mouth at bedtime. 09/05/19   Nuala Alpha, DO  aspirin EC 81 MG tablet Take 81 mg by mouth daily.    [provider]  atorvastatin (LIPITOR) 80 MG tablet TAKE 1 TABLET BY MOUTH DAILY AT 6 PM. 04/29/20   Leeanne Rio,  MD  Blood Glucose Monitoring Suppl (ONETOUCH VERIO) w/Device KIT 1 kit by Does not apply route daily. Check sugar twice daily 01/30/19   Leeanne Rio, MD  glucose blood [facility] VERIO) test strip Check sugar twice daily 06/25/17   Leeanne Rio, MD  insulin glargine (LANTUS) 100 UNIT/ML Solostar Pen Inject 15 Units into the skin daily. 06/25/20   Leeanne Rio, MD  Insulin Pen Needle (PEN NEEDLES) 32G X 4 MM MISC Inject 1 Dose into the skin as directed. 07/20/19   Zenia Resides, MD  Insulin Syringe-Needle U-100 (BD INSULIN SYRINGE U/F) 31G X 5/16" 0.3 ML MISC USE TO INJECT LANTUS DAILY 09/22/18   Leeanne Rio, MD  [facility] DELICA LANCETS 62I MISC Check sugar twice daily 06/25/17   Leeanne Rio, MD  Semaglutide,0.25 or 0.5MG/DOS, (OZEMPIC, 0.25 OR 0.5 MG/DOSE,) 2 MG/1.5ML SOPN Inject 0.25 mg into the skin once a week. (Thursdays) 07/20/19   Zenia Resides, MD  triamcinolone ointment (KENALOG) 0.5 % Apply 1 application topically 2 (two) times daily as needed.  06/13/20   Mullis, Kiersten P, DO     Critical care time: 35 minutes

## 2020-08-12 NOTE — Progress Notes (Signed)
RT assisted with transportation of this pt from ED RM7 to CT2 while on full ventilatory support. Pt tolerated well with SVS.

## 2020-08-12 NOTE — Progress Notes (Signed)
RT assisted with transportation of this pt from RES room in ED to room 7 in ED per RN. Pt tolerated well with SVS.

## 2020-08-12 NOTE — Progress Notes (Signed)
eLink Physician-Brief Progress Note Patient Name: KHAMILLE BEYNON DOB: 02/28/1946 MRN: [id number]   Date of Service  08/12/2020  HPI/Events of Note  34 F history of hypertension, dyslipidemia and DM (A1C 5.9), alzheimer's dementia, presents with stridor and dysphagia. Noted to have bruising on left arm and hand as well as developed bruising along left mandible with bag mask ventilation. Platelets 556, H/H 7/25, WBC 13. PT 14.6, INR 1.1, APTT 90  Sedated on propofol Intubated VAC 15/470/40%/5 PEEP  eICU Interventions  Started empirically on dexamethasone Work up for blood dyscrasia        Darl Pikes 08/12/2020, 8:30 PM

## 2020-08-13 ENCOUNTER — Telehealth: Payer: Self-pay | Admitting: *Deleted

## 2020-08-13 ENCOUNTER — Inpatient Hospital Stay (HOSPITAL_COMMUNITY): Payer: Medicare Other

## 2020-08-13 DIAGNOSIS — R061 Stridor: Secondary | ICD-10-CM | POA: Diagnosis not present

## 2020-08-13 DIAGNOSIS — J988 Other specified respiratory disorders: Secondary | ICD-10-CM | POA: Diagnosis not present

## 2020-08-13 DIAGNOSIS — M79642 Pain in left hand: Secondary | ICD-10-CM | POA: Insufficient documentation

## 2020-08-13 LAB — URINALYSIS, ROUTINE W REFLEX MICROSCOPIC
Bilirubin Urine: NEGATIVE
Glucose, UA: NEGATIVE mg/dL
Hgb urine dipstick: NEGATIVE
Ketones, ur: 5 mg/dL — AB
Leukocytes,Ua: NEGATIVE
Nitrite: NEGATIVE
Protein, ur: NEGATIVE mg/dL
Specific Gravity, Urine: 1.032 — ABNORMAL HIGH (ref 1.005–1.030)
pH: 5 (ref 5.0–8.0)

## 2020-08-13 LAB — POCT I-STAT 7, (LYTES, BLD GAS, ICA,H+H)
Acid-Base Excess: 2 mmol/L (ref 0.0–2.0)
Bicarbonate: 25.3 mmol/L (ref 20.0–28.0)
Calcium, Ion: 1.21 mmol/L (ref 1.15–1.40)
HCT: 21 % — ABNORMAL LOW (ref 36.0–46.0)
Hemoglobin: 7.1 g/dL — ABNORMAL LOW (ref 12.0–15.0)
O2 Saturation: 99 %
Patient temperature: 97.9
Potassium: 3.1 mmol/L — ABNORMAL LOW (ref 3.5–5.1)
Sodium: 139 mmol/L (ref 135–145)
TCO2: 26 mmol/L (ref 22–32)
pCO2 arterial: 31.1 mmHg — ABNORMAL LOW (ref 32.0–48.0)
pH, Arterial: 7.517 — ABNORMAL HIGH (ref 7.350–7.450)
pO2, Arterial: 112 mmHg — ABNORMAL HIGH (ref 83.0–108.0)

## 2020-08-13 LAB — BLOOD CULTURE ID PANEL (REFLEXED) - BCID2

## 2020-08-13 LAB — MAGNESIUM
Magnesium: 2.1 mg/dL (ref 1.7–2.4)
Magnesium: 2 mg/dL (ref 1.7–2.4)

## 2020-08-13 LAB — HEPATIC FUNCTION PANEL
ALT: 11 U/L (ref 0–44)
AST: 16 U/L (ref 15–41)
Albumin: 3 g/dL — ABNORMAL LOW (ref 3.5–5.0)
Alkaline Phosphatase: 68 U/L (ref 38–126)
Bilirubin, Direct: 0.7 mg/dL — ABNORMAL HIGH (ref 0.0–0.2)
Indirect Bilirubin: 1.5 mg/dL — ABNORMAL HIGH (ref 0.3–0.9)
Total Bilirubin: 2.2 mg/dL — ABNORMAL HIGH (ref 0.3–1.2)
Total Protein: 7.1 g/dL (ref 6.5–8.1)

## 2020-08-13 LAB — GLUCOSE, CAPILLARY
Glucose-Capillary: 154 mg/dL — ABNORMAL HIGH (ref 70–99)
Glucose-Capillary: 163 mg/dL — ABNORMAL HIGH (ref 70–99)
Glucose-Capillary: 181 mg/dL — ABNORMAL HIGH (ref 70–99)
Glucose-Capillary: 203 mg/dL — ABNORMAL HIGH (ref 70–99)
Glucose-Capillary: 185 mg/dL — ABNORMAL HIGH (ref 70–99)
Glucose-Capillary: 232 mg/dL — ABNORMAL HIGH (ref 70–99)

## 2020-08-13 LAB — CBC
HCT: 23 % — ABNORMAL LOW (ref 36.0–46.0)
Hemoglobin: 6.8 g/dL — CL (ref 12.0–15.0)
MCH: 27.4 pg (ref 26.0–34.0)
MCHC: 29.6 g/dL — ABNORMAL LOW (ref 30.0–36.0)
MCV: 92.7 fL (ref 80.0–100.0)
Platelets: 550 10*3/uL — ABNORMAL HIGH (ref 150–400)
RBC: 2.48 MIL/uL — ABNORMAL LOW (ref 3.87–5.11)
RDW: 16.8 % — ABNORMAL HIGH (ref 11.5–15.5)
WBC: 14.6 10*3/uL — ABNORMAL HIGH (ref 4.0–10.5)
nRBC: 0.2 % (ref 0.0–0.2)

## 2020-08-13 LAB — BASIC METABOLIC PANEL
Anion gap: 11 (ref 5–15)
BUN: 15 mg/dL (ref 8–23)
CO2: 24 mmol/L (ref 22–32)
Calcium: 9.6 mg/dL (ref 8.9–10.3)
Chloride: 102 mmol/L (ref 98–111)
Creatinine, Ser: 0.66 mg/dL (ref 0.44–1.00)
GFR, Estimated: >60 mL/min (ref >60)
Glucose, Bld: 158 mg/dL — ABNORMAL HIGH (ref 70–99)
Potassium: 3.1 mmol/L — ABNORMAL LOW (ref 3.5–5.1)
Sodium: 137 mmol/L (ref 135–145)

## 2020-08-13 LAB — PHOSPHORUS
Phosphorus: 3.4 mg/dL (ref 2.5–4.6)
Phosphorus: 3.6 mg/dL (ref 2.5–4.6)

## 2020-08-13 LAB — HEMOGLOBIN AND HEMATOCRIT, BLOOD
HCT: 23.3 % — ABNORMAL LOW (ref 36.0–46.0)
Hemoglobin: 7.2 g/dL — ABNORMAL LOW (ref 12.0–15.0)

## 2020-08-13 LAB — SEDIMENTATION RATE: Sed Rate: 80 mm/hr — ABNORMAL HIGH (ref 0–22)

## 2020-08-13 LAB — C-REACTIVE PROTEIN: CRP: 12.9 mg/dL — ABNORMAL HIGH (ref <1.0)

## 2020-08-13 LAB — STREP PNEUMONIAE URINARY ANTIGEN: Strep Pneumo Urinary Antigen: NEGATIVE

## 2020-08-13 LAB — PROCALCITONIN: Procalcitonin: 0.15 ng/mL

## 2020-08-13 LAB — PREPARE RBC (CROSSMATCH)

## 2020-08-13 LAB — TRIGLYCERIDES: Triglycerides: 131 mg/dL (ref <150)

## 2020-08-13 MED ORDER — PROSOURCE TF PO LIQD
45.0000 mL | Freq: Two times a day (BID) | ORAL | Status: DC
  Administered 2020-08-13: 45 mL
  Filled 2020-08-13: qty 45

## 2020-08-13 MED ORDER — LACTATED RINGERS IV BOLUS
1000.0000 mL | Freq: Once | INTRAVENOUS | Status: AC
  Administered 2020-08-13: 1000 mL via INTRAVENOUS

## 2020-08-13 MED ORDER — POTASSIUM CHLORIDE 10 MEQ/100ML IV SOLN
10.0000 meq | INTRAVENOUS | Status: AC
  Administered 2020-08-13 (×4): 10 meq via INTRAVENOUS
  Filled 2020-08-13 (×4): qty 100

## 2020-08-13 MED ORDER — POTASSIUM CHLORIDE 20 MEQ PO PACK
20.0000 meq | PACK | ORAL | Status: AC
  Administered 2020-08-13 (×2): 20 meq
  Filled 2020-08-13 (×2): qty 1

## 2020-08-13 MED ORDER — ADULT MULTIVITAMIN W/MINERALS CH
1.0000 | ORAL_TABLET | Freq: Every day | ORAL | Status: DC
  Administered 2020-08-13 – 2020-08-16 (×4): 1
  Filled 2020-08-13 (×5): qty 1

## 2020-08-13 MED ORDER — MAGNESIUM SULFATE 2 GM/50ML IV SOLN: 2.0000 g | Freq: Once | INTRAVENOUS | Status: DC

## 2020-08-13 MED ORDER — VITAL HIGH PROTEIN PO LIQD
1000.0000 mL | ORAL | Status: DC
  Administered 2020-08-13 – 2020-08-15 (×3): 1000 mL

## 2020-08-13 MED ORDER — PROSOURCE TF PO LIQD
90.0000 mL | Freq: Two times a day (BID) | ORAL | Status: DC
  Administered 2020-08-13 – 2020-08-16 (×6): 90 mL
  Filled 2020-08-13 (×6): qty 90

## 2020-08-13 MED ORDER — SODIUM CHLORIDE 0.9% IV SOLUTION: Freq: Once | INTRAVENOUS | Status: DC

## 2020-08-13 NOTE — Progress Notes (Signed)
Please disregard opened in error ,pt currently inpatient.

## 2020-08-13 NOTE — Progress Notes (Signed)
NAME:  Paula Reed, MRN:  [id number], DOB:  [birth year], LOS: 1 ADMISSION DATE:  08/12/2020, CONSULTATION DATE:  08/12/20 REFERRING MD:  Regenia Skeeter, CHIEF COMPLAINT:  stridor  History of Present Illness:  History limited to chart review and pt's elderly husband's bedside report due to intubation/NCI  Paula Reed is a 75 y.o. 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 Patient presented to the ED from home with husband for audible stridor with potential SOB and congestion. On 4/18, patient had dysphagia and trouble breathing that progressed to stridor. In ED, stridor was audible. Evaluated by ENT and found to have supraglottis swelling and discoloration c/w a hematoma. Was intubated 2/2 c/f airway closure. Husband denies trauma, smoke inhalation, and recent sick contracts. Denies recent changes to medications or new anticoagulant therapies. Notes that pt has a family history of diabetes and "black spots" that lead to amputation. Unclear if husband was referring to necrosis or other process. States always brings wife to medical attention whenever "black spots" arise. Denies/unsure of a family history of coagulopathies or hemoglobinopathies. Denies issues with pregnancy in the past. Patient has presented to PCP recently for Right arm swelling and husband reports swelling that has been waxing and waning in extremities.   CT neck demonstrated mild/possible laryngeal edema w/out any evidence of LAD. CT chest demonstrated small mediastinal nodes and mild bilateral lower lobe atelectasis with trace bilateral pleural effusions.   Pertinent  Medical History  T2DM, insulin dependent HTN HLD Probable Alzheimer's Dementia  Seizures  Fam Hx of DM   Significant Hospital Events: Including procedures, antibiotic start and stop dates in addition to other  pertinent events   . 4/18 intubated for airway protection   Interim History / Subjective:  NAEON. Husband updated at bedside and provided additional history above. Patient attempted to be weaned this morning but was too agitated to proceed.   Objective   Blood pressure (!) 155/67, pulse (!) 103, temperature 99 F (37.2 C), temperature source Oral, resp. rate 18, height 5' 6"  (1.676 m), weight 92.6 kg, SpO2 100 %.    Vent Mode: PRVC FiO2 (%):  [30 %-100 %] 30 % Set Rate:  [15 bmp-18 bmp] 15 bmp Vt Set:  [470 mL] 470 mL PEEP:  [5 cmH20] 5 cmH20 Plateau Pressure:  [18 cmH20-23 cmH20] 18 cmH20   Intake/Output Summary (Last 24 hours) at 08/13/2020 1445 Last data filed at 08/13/2020 1300 Gross per 24 hour  Intake 1874.41 ml  Output 585 ml  Net 1289.41 ml   Filed Weights   08/12/20 1958 08/13/20 0147  Weight: 92.6 kg 92.6 kg    Examination: General: sedated, NAD HENT: MMM, sedated, OGT and ETT in place, submandibular bruising along skin folds, no LAD appreciated  Lungs: CTAB in anterior fields Cardiovascular: nml s1/s2, no m/r/g, no peripheral edema Abdomen: soft, nondistended Extremities: coarse, rough skin over the extensor surfaces of upper extremities that appears lichenous, dry skin, some bruising and scabbed wounds, palms and soles of feet significantly lighter than baseline skin tone, bruising over palms Neuro: sedated, PERL GU: deferred  Labs/imaging that I havepersonally reviewed  (right click and "Reselect all SmartList Selections" daily)  CBC CMP coags Ddimer  Fibrinogen  Resolved Hospital Problem list   NA  Assessment & Plan:  Paula Reed is a 75 y.o. 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 CT neck and CT chest without obvious focal process. Elevated ESR and CRP in setting of leukocytosis,  thrombocytosis, and anemia is c/f autoimmune process. History not c/w hx of thrombosis but unclear fam hx is c/f potential vasculitis or clotting disorder. aPPT elevated without any recent anticoagulant medications. No evidence of infection at this time but will continue to consider. Can also consider a vasculitis process.    Extubation  Stridor Concern for airway occlusion. Will request assistance from ENT for possible extubation in OR when patient indicates more readiness to wean.  - Continue decadron - Continue w/ PVRC min setting support  - CTM  Stridor  Laryngeal Edema and Possible Hematoma  Thrombocytosis  Root etiology at this time unclear. Thrombocytosis and leukocytosis c/w acute inflammatory process. History potentially suggestive of recent myalgias/arthralgias.  - F/up ESR, CRP, ANA, antiphospholipid panel  - F/up LFTs - CTM for fever  Normocytic Anemia Down from a normal level a year ago. Recent value from 4/14 before hospitalization was 7.5 versus 6.8 now. Transfusing one unit. Will consider hemolysis based on Tbili and LFTs.  - S/p one unit   T2DM Currently well controlled.  - Continue with SSI     Best practice (right click and "Reselect all SmartList Selections" daily)  Diet:  Tube Feed  Pain/Anxiety/Delirium protocol (if indicated): Yes (RASS goal -1) VAP protocol (if indicated): Not indicated DVT prophylaxis: LMWH and Contraindicated GI prophylaxis: PPI Glucose control:  SSI Yes Central venous access:  N/A Arterial line:  N/A Foley:  Yes, and it is still needed Mobility:  bed rest  PT consulted: N/A Last date of multidisciplinary goals of care discussion []  Code Status:  full code Disposition: ICU  Labs   CBC: Recent Labs  Lab 08/08/20 1015 08/12/20 1232 08/12/20 1615 08/12/20 1628 08/13/20 0336 08/13/20 0634  WBC 11.0* 13.1*  --   --   --  14.6*  NEUTROABS  --  10.0*  --   --   --   --   HGB 7.5* 7.0*  --  7.8* 7.1* 6.8*  HCT 25.2* 25.0*  --   23.0* 21.0* 23.0*  MCV 93 99.6  --   --   --  92.7  PLT 527* 556* 391  --   --  550*    Basic Metabolic Panel: Recent Labs  Lab 08/12/20 1232 08/12/20 1628 08/13/20 0336 08/13/20 0634  NA 137 138 139 137  K 3.4* 3.4* 3.1* 3.1*  CL 99  --   --  102  CO2 23  --   --  24  GLUCOSE 175*  --   --  158*  BUN 11  --   --  15  CREATININE 0.68  --   --  0.66  CALCIUM 9.4  --   --  9.6  MG  --   --   --  2.1  PHOS  --   --   --  3.4   GFR: Estimated Creatinine Clearance: 71.8 mL/min (by C-G formula based on SCr of 0.66 mg/dL). Recent Labs  Lab 08/08/20 1015 08/12/20 1232 08/13/20 0634 08/13/20 1000  PROCALCITON  --   --   --  0.15  WBC 11.0* 13.1* 14.6*  --     Liver Function Tests: No results for input(s): AST, ALT, ALKPHOS, BILITOT, PROT, ALBUMIN in the last 168 hours. No results for input(s): LIPASE, AMYLASE  in the last 168 hours. No results for input(s): AMMONIA in the last 168 hours.  ABG    Component Value Date/Time   PHART 7.517 (H) 08/13/2020 0336   PCO2ART 31.1 (L) 08/13/2020 0336   PO2ART 112 (H) 08/13/2020 0336   HCO3 25.3 08/13/2020 0336   TCO2 26 08/13/2020 0336   ACIDBASEDEF 1.5 06/07/2017 0640   O2SAT 99.0 08/13/2020 0336     Coagulation Profile: Recent Labs  Lab 08/08/20 1015 08/12/20 1615  INR 1.0 1.1  1.1    Cardiac Enzymes: No results for input(s): CKTOTAL, CKMB, CKMBINDEX, TROPONINI in the last 168 hours.  HbA1C: Hemoglobin A1C  Date/Time Value Ref Range Status  06/25/2020 10:45 AM 5.9 (A) 4.0 - 5.6 % Final  04/02/2020 11:40 AM 7.7 (A) 4.0 - 5.6 % Final  06/04/2016 12:00 AM 6.5  Final   HbA1c, POC (controlled diabetic range)  Date/Time Value Ref Range Status  09/05/2019 04:14 PM 8.6 (A) 0.0 - 7.0 % Final  06/23/2019 10:37 AM 12.7 (A) 0.0 - 7.0 % Final    CBG: Recent Labs  Lab 08/12/20 1955 08/12/20 2308 08/13/20 0331 08/13/20 0757 08/13/20 1145  GLUCAP 165* 165* 154* 163* 181*    Review of Systems:     Past Medical  History:  She,  has a past medical history of Allergic rhinitis, Diabetes ([place]), Diabetes mellitus without complication ([place]), Hemorrhoids, Hyperlipidemia, Hypertension, Probable Alzheimer's dementia without behavioral disturbance (09/15/2015), and Seizures ([place]).   Surgical History:   Past Surgical History:  Procedure Laterality Date  . carpel tunnel release    . ECTOPIC PREGNANCY SURGERY    . TEE WITHOUT CARDIOVERSION N/A 06/07/2018   Procedure: TRANSESOPHAGEAL ECHOCARDIOGRAM (TEE);  Surgeon: Lelon Perla, MD;  Location: [facility] ENDOSCOPY;  Service: Cardiovascular;  Laterality: N/A;     Social History:   reports that she has quit smoking. She has never used smokeless tobacco. She reports that she does not drink alcohol and does not use drugs.   Family History:  Her family history includes Colon cancer in an other family member; Heart disease in her father; Hypertension in her father and mother.   Allergies Allergies  Allergen Reactions  . Metformin And Related Rash  . Chlorthalidone Rash    Per [facility] records, pt may have had a rash rxn to chlorthalidone     Home Medications  Prior to Admission medications   Medication Sig Start Date End Date Taking? Authorizing Provider  acetaminophen (TYLENOL) 500 MG tablet Take 1,000 mg by mouth every 6 (six) hours as needed for headache (pain).   Yes [provider]  amLODipine (NORVASC) 5 MG tablet Take 1 tablet (5 mg total) by mouth at bedtime. 09/05/19  Yes Lockamy, Christia Reading, DO  aspirin EC 81 MG tablet Take 81 mg by mouth every morning.   Yes [provider]  atorvastatin (LIPITOR) 80 MG tablet TAKE 1 TABLET BY MOUTH DAILY AT 6 PM. Patient taking differently: Take 80 mg by mouth at bedtime. 04/29/20  Yes Leeanne Rio, MD  insulin glargine (LANTUS) 100 UNIT/ML Solostar Pen Inject 15 Units into the skin daily. Patient taking differently: Inject 15 Units into the skin at bedtime. 06/25/20  Yes Leeanne Rio, MD   Semaglutide,0.25 or 0.5MG/DOS, (OZEMPIC, 0.25 OR 0.5 MG/DOSE,) 2 MG/1.5ML SOPN Inject 0.25 mg into the skin once a week. (Thursdays) Patient taking differently: Inject 0.25 mg into the skin every Thursday. 07/20/19  Yes Hensel, Jamal Collin, MD  triamcinolone ointment (KENALOG) 0.5 % Apply  1 application topically 2 (two) times daily as needed. Patient taking differently: Apply 1 application topically 2 (two) times daily as needed (itching/rash). 06/13/20  Yes Mullis, Kiersten P, DO  Blood Glucose Monitoring Suppl (ONETOUCH VERIO) w/Device KIT 1 kit by Does not apply route daily. Check sugar twice daily 01/30/19   Leeanne Rio, MD  glucose blood [facility] VERIO) test strip Check sugar twice daily 06/25/17   Leeanne Rio, MD  Insulin Pen Needle (PEN NEEDLES) 32G X 4 MM MISC Inject 1 Dose into the skin as directed. 07/20/19   Zenia Resides, MD  Insulin Syringe-Needle U-100 (BD INSULIN SYRINGE U/F) 31G X 5/16" 0.3 ML MISC USE TO INJECT LANTUS DAILY 09/22/18   Leeanne Rio, MD  [facility] LANCETS 24Q MISC Check sugar twice daily 06/25/17   Leeanne Rio, MD     Critical care time: 6 minutes     Domenick Bookbinder, MS4

## 2020-08-13 NOTE — Plan of Care (Signed)
  Problem: Activity: Goal: Ability to tolerate increased activity will improve Outcome: Progressing   Problem: Elimination: Goal: Will not experience complications related to urinary retention Outcome: Progressing   Problem: Nutrition: Goal: Adequate nutrition will be maintained Outcome: Not Progressing Note: Pt is currently NPO. Order for tube feeds to be started today. OG already in place.   Problem: Activity: Goal: Risk for activity intolerance will decrease Outcome: Not Progressing Note: Pt is sedated and intubated. Unable to mobilize at this time.   Problem: Elimination: Goal: Will not experience complications related to bowel motility Outcome: Not Progressing

## 2020-08-13 NOTE — Progress Notes (Signed)
PHARMACY - PHYSICIAN COMMUNICATION CRITICAL VALUE ALERT - BLOOD CULTURE IDENTIFICATION (BCID)  Paula Reed is an 75 y.o. female who presented to [facility] on 08/12/2020 with a chief complaint of dysphagia/SOB  Assessment:  76 YOF with worsening SOB/stridor requiring intubation with imaging showing laryngeal edema and possible hematoma. The patient is currently without concern for infection and on no antibiotics. Now with 1 of 2 blood cultures (only 1 bottle drawn with each "set") positive for strep species.   Name of physician (or Provider) Contacted: [place]  Current antibiotics: None  Changes to prescribed antibiotics recommended:  None - continue to monitor off antibiotics, could represent contamination. Consider repeat blood cultures if infection becomes a concern and/or if patient shows signs or symptoms of sepsis.   Results for orders placed or performed during the hospital encounter of 08/12/20  Blood Culture ID Panel (Reflexed) (Collected: 08/12/2020  9:09 PM)  Result Value Ref Range   Enterococcus faecalis NOT DETECTED NOT DETECTED   Enterococcus Faecium NOT DETECTED NOT DETECTED   Listeria monocytogenes NOT DETECTED NOT DETECTED   Staphylococcus species NOT DETECTED NOT DETECTED   Staphylococcus aureus (BCID) NOT DETECTED NOT DETECTED   Staphylococcus epidermidis NOT DETECTED NOT DETECTED   Staphylococcus lugdunensis NOT DETECTED NOT DETECTED   Streptococcus species DETECTED (A) NOT DETECTED   Streptococcus agalactiae NOT DETECTED NOT DETECTED   Streptococcus pneumoniae NOT DETECTED NOT DETECTED   Streptococcus pyogenes NOT DETECTED NOT DETECTED   A.calcoaceticus-baumannii NOT DETECTED NOT DETECTED   Bacteroides fragilis NOT DETECTED NOT DETECTED   Enterobacterales NOT DETECTED NOT DETECTED   Enterobacter cloacae complex NOT DETECTED NOT DETECTED   Escherichia coli NOT DETECTED NOT DETECTED   Klebsiella aerogenes NOT DETECTED NOT DETECTED   Klebsiella oxytoca NOT  DETECTED NOT DETECTED   Klebsiella pneumoniae NOT DETECTED NOT DETECTED   Proteus species NOT DETECTED NOT DETECTED   Salmonella species NOT DETECTED NOT DETECTED   Serratia marcescens NOT DETECTED NOT DETECTED   Haemophilus influenzae NOT DETECTED NOT DETECTED   Neisseria meningitidis NOT DETECTED NOT DETECTED   Pseudomonas aeruginosa NOT DETECTED NOT DETECTED   Stenotrophomonas maltophilia NOT DETECTED NOT DETECTED   Candida albicans NOT DETECTED NOT DETECTED   Candida auris NOT DETECTED NOT DETECTED   Candida glabrata NOT DETECTED NOT DETECTED   Candida krusei NOT DETECTED NOT DETECTED   Candida parapsilosis NOT DETECTED NOT DETECTED   Candida tropicalis NOT DETECTED NOT DETECTED   Cryptococcus neoformans/gattii NOT DETECTED NOT DETECTED    Thank you for allowing pharmacy to be a part of this patient's care.  Georgina Pillion, PharmD, BCPS Clinical Pharmacist Clinical phone for 08/13/2020: [phone] 08/13/2020 9:36 PM   **Pharmacist phone directory can now be found on [email or web address] ([email or web address]).  Listed under [facility].   [name]

## 2020-08-13 NOTE — Progress Notes (Signed)
Initial Nutrition Assessment  DOCUMENTATION CODES:   Obesity unspecified  INTERVENTION:   Initiate tube feeding via OG tube: Vital High Protein at 40 ml/h (960 ml per day) Prosource TF 90 ml BID  Provides 1120 kcal, 128 gm protein, 802 ml free water daily  TF regimen and propofol at current rate providing 1832 total kcal/day   MVI with minerals daily   NUTRITION DIAGNOSIS:   Inadequate oral intake related to inability to eat as evidenced by NPO status.  GOAL:   Provide needs based on ASPEN/SCCM guidelines  MONITOR:   TF tolerance  REASON FOR ASSESSMENT:   Consult,Ventilator Enteral/tube feeding initiation and management  ASSESSMENT:   Pt with PMH of Seizures, low grade Alzheimer's, HTN, HLD, and DM admitted with stridor preceded by dysphagia.    Spoke with RN at bedside. Per RN pt is from home and per MD possible retropharyngeal hematoma. Per RN ecchymosis noted per husband no recent falls. No family present.   Patient is currently intubated on ventilator support MV: 6.8 L/min Temp (24hrs), Avg:97.8 F (36.6 C), Min:97.7 F (36.5 C), Max:97.9 F (36.6 C)  Propofol: 27 ml/hr provides: 712 kcal  Medications reviewed and include: decadron, colace, SSI, miralax, KCl 20 mEq every 4 hours  LR @ 50 ml/hr Labs reviewed: CRP: 12.9, K+ 3.1 CBG: 181   OG tube: tip in mid-stomach   NUTRITION - FOCUSED PHYSICAL EXAM:  Flowsheet Row Most Recent Value  Orbital Region No depletion  Upper Arm Region No depletion  Thoracic and Lumbar Region No depletion  Buccal Region No depletion  Temple Region No depletion  Clavicle Bone Region No depletion  Clavicle and Acromion Bone Region No depletion  Scapular Bone Region No depletion  Dorsal Hand No depletion  Patellar Region No depletion  Anterior Thigh Region No depletion  Posterior Calf Region No depletion  Edema (RD Assessment) None  Hair Reviewed  Eyes Unable to assess  Mouth Unable to assess  Skin Reviewed   Nails Reviewed       Diet Order:   Diet Order            Diet NPO time specified  Diet effective now                 EDUCATION NEEDS:   No education needs have been identified at this time  Skin:  Skin Assessment: Reviewed RN Assessment  Last BM:  unknown  Height:   Ht Readings from Last 1 Encounters:  08/12/20 5\' 6"  (1.676 m)    Weight:   Wt Readings from Last 1 Encounters:  08/13/20 92.6 kg    Ideal Body Weight:  59 kg  BMI:  Body mass index is 32.95 kg/m.  Estimated Nutritional Needs:   Kcal:  1300  Protein:  118 grams  Fluid:  >1.5 L/day  08/15/20., RD, LDN, CNSC See AMiON for contact information

## 2020-08-13 NOTE — Progress Notes (Signed)
Pharmacy Electrolyte Replacement  Recent Labs:  Recent Labs    08/13/20 0634  K 3.1*  MG 2.1  PHOS 3.4  CREATININE 0.66    Low Critical Values (K </= 2.5, Phos </= 1, Mg </= 1) Present:None  Plan: K 3.1, repleted per protocol with KCl 20 mEq per tube x2 and KCl 10 mEq IV x 4 runs.  Laverna Peace, PharmD PGY-1 Pharmacy Resident 08/13/2020 9:44 AM Please see AMION for all pharmacy numbers

## 2020-08-14 ENCOUNTER — Inpatient Hospital Stay (HOSPITAL_COMMUNITY): Payer: Medicare Other

## 2020-08-14 DIAGNOSIS — D72829 Elevated white blood cell count, unspecified: Secondary | ICD-10-CM

## 2020-08-14 DIAGNOSIS — J9690 Respiratory failure, unspecified, unspecified whether with hypoxia or hypercapnia: Secondary | ICD-10-CM

## 2020-08-14 DIAGNOSIS — R061 Stridor: Secondary | ICD-10-CM | POA: Diagnosis not present

## 2020-08-14 DIAGNOSIS — D75839 Thrombocytosis, unspecified: Secondary | ICD-10-CM | POA: Diagnosis not present

## 2020-08-14 DIAGNOSIS — D649 Anemia, unspecified: Secondary | ICD-10-CM | POA: Diagnosis not present

## 2020-08-14 DIAGNOSIS — J988 Other specified respiratory disorders: Secondary | ICD-10-CM | POA: Diagnosis not present

## 2020-08-14 LAB — CBC
HCT: 23 % — ABNORMAL LOW (ref 36.0–46.0)
Hemoglobin: 7.1 g/dL — ABNORMAL LOW (ref 12.0–15.0)
MCH: 27.5 pg (ref 26.0–34.0)
MCHC: 30.9 g/dL (ref 30.0–36.0)
MCV: 89.1 fL (ref 80.0–100.0)
Platelets: 376 10*3/uL (ref 150–400)
RBC: 2.58 MIL/uL — ABNORMAL LOW (ref 3.87–5.11)
RDW: 17.8 % — ABNORMAL HIGH (ref 11.5–15.5)
WBC: 14.9 10*3/uL — ABNORMAL HIGH (ref 4.0–10.5)
nRBC: 0.8 % — ABNORMAL HIGH (ref 0.0–0.2)

## 2020-08-14 LAB — COMPREHENSIVE METABOLIC PANEL
ALT: 12 U/L (ref 0–44)
AST: 16 U/L (ref 15–41)
Albumin: 2.8 g/dL — ABNORMAL LOW (ref 3.5–5.0)
Alkaline Phosphatase: 61 U/L (ref 38–126)
Anion gap: 7 (ref 5–15)
BUN: 23 mg/dL (ref 8–23)
CO2: 26 mmol/L (ref 22–32)
Calcium: 9.1 mg/dL (ref 8.9–10.3)
Chloride: 106 mmol/L (ref 98–111)
Creatinine, Ser: 0.6 mg/dL (ref 0.44–1.00)
GFR, Estimated: >60 mL/min (ref >60)
Glucose, Bld: 212 mg/dL — ABNORMAL HIGH (ref 70–99)
Potassium: 4 mmol/L (ref 3.5–5.1)
Sodium: 139 mmol/L (ref 135–145)
Total Bilirubin: 1.3 mg/dL — ABNORMAL HIGH (ref 0.3–1.2)
Total Protein: 6.7 g/dL (ref 6.5–8.1)

## 2020-08-14 LAB — LACTATE DEHYDROGENASE: LDH: 159 U/L (ref 98–192)

## 2020-08-14 LAB — RETICULOCYTES
Immature Retic Fract: 39 % — ABNORMAL HIGH (ref 2.3–15.9)
RBC.: 2.37 MIL/uL — ABNORMAL LOW (ref 3.87–5.11)
Retic Count, Absolute: 151.2 10*3/uL (ref 19.0–186.0)
Retic Ct Pct: 6.4 % — ABNORMAL HIGH (ref 0.4–3.1)

## 2020-08-14 LAB — DIRECT ANTIGLOBULIN TEST (NOT AT ARMC)
DAT, IgG: NEGATIVE
DAT, complement: NEGATIVE

## 2020-08-14 LAB — CBC WITH DIFFERENTIAL/PLATELET
Abs Immature Granulocytes: 0.22 10*3/uL — ABNORMAL HIGH (ref 0.00–0.07)
Basophils Absolute: 0 10*3/uL (ref 0.0–0.1)
Basophils Relative: 0 %
Eosinophils Absolute: 0 10*3/uL (ref 0.0–0.5)
Eosinophils Relative: 0 %
HCT: 21.1 % — ABNORMAL LOW (ref 36.0–46.0)
Hemoglobin: 6.3 g/dL — CL (ref 12.0–15.0)
Immature Granulocytes: 1 %
Lymphocytes Relative: 6 %
Lymphs Abs: 1.1 10*3/uL (ref 0.7–4.0)
MCH: 27.2 pg (ref 26.0–34.0)
MCHC: 29.9 g/dL — ABNORMAL LOW (ref 30.0–36.0)
MCV: 90.9 fL (ref 80.0–100.0)
Monocytes Absolute: 1.3 10*3/uL — ABNORMAL HIGH (ref 0.1–1.0)
Monocytes Relative: 8 %
Neutro Abs: 14.7 10*3/uL — ABNORMAL HIGH (ref 1.7–7.7)
Neutrophils Relative %: 85 %
Platelets: 475 10*3/uL — ABNORMAL HIGH (ref 150–400)
RBC: 2.32 MIL/uL — ABNORMAL LOW (ref 3.87–5.11)
RDW: 19 % — ABNORMAL HIGH (ref 11.5–15.5)
WBC: 17.3 10*3/uL — ABNORMAL HIGH (ref 4.0–10.5)
nRBC: 0.5 % — ABNORMAL HIGH (ref 0.0–0.2)

## 2020-08-14 LAB — PHOSPHORUS
Phosphorus: 2.8 mg/dL (ref 2.5–4.6)
Phosphorus: 3 mg/dL (ref 2.5–4.6)

## 2020-08-14 LAB — GLUCOSE, CAPILLARY
Glucose-Capillary: 206 mg/dL — ABNORMAL HIGH (ref 70–99)
Glucose-Capillary: 180 mg/dL — ABNORMAL HIGH (ref 70–99)
Glucose-Capillary: 213 mg/dL — ABNORMAL HIGH (ref 70–99)
Glucose-Capillary: 148 mg/dL — ABNORMAL HIGH (ref 70–99)
Glucose-Capillary: 153 mg/dL — ABNORMAL HIGH (ref 70–99)
Glucose-Capillary: 115 mg/dL — ABNORMAL HIGH (ref 70–99)

## 2020-08-14 LAB — URINE CULTURE: Culture: NO GROWTH

## 2020-08-14 LAB — HEMOGLOBIN AND HEMATOCRIT, BLOOD
HCT: 26 % — ABNORMAL LOW (ref 36.0–46.0)
Hemoglobin: 8.1 g/dL — ABNORMAL LOW (ref 12.0–15.0)

## 2020-08-14 LAB — ANA: Anti Nuclear Antibody (ANA): NEGATIVE

## 2020-08-14 LAB — APTT: aPTT: 87 seconds — ABNORMAL HIGH (ref 24–36)

## 2020-08-14 LAB — MAGNESIUM
Magnesium: 2.1 mg/dL (ref 1.7–2.4)
Magnesium: 2.1 mg/dL (ref 1.7–2.4)

## 2020-08-14 LAB — PROTIME-INR
INR: 1.2 (ref 0.8–1.2)
Prothrombin Time: 15 seconds (ref 11.4–15.2)

## 2020-08-14 LAB — CYCLIC CITRUL PEPTIDE ANTIBODY, IGG/IGA: CCP Antibodies IgG/IgA: 4 units (ref 0–19)

## 2020-08-14 LAB — PREPARE RBC (CROSSMATCH)

## 2020-08-14 MED ORDER — LACTATED RINGERS IV BOLUS
1000.0000 mL | Freq: Once | INTRAVENOUS | Status: AC
  Administered 2020-08-14: 1000 mL via INTRAVENOUS

## 2020-08-14 MED ORDER — INSULIN GLARGINE 100 UNIT/ML ~~LOC~~ SOLN
10.0000 [IU] | Freq: Every day | SUBCUTANEOUS | Status: DC
  Administered 2020-08-14 – 2020-08-19 (×6): 10 [IU] via SUBCUTANEOUS
  Filled 2020-08-14 (×6): qty 0.1

## 2020-08-14 MED ORDER — SODIUM CHLORIDE 0.9 % IV SOLN
2.0000 g | INTRAVENOUS | Status: DC
  Administered 2020-08-14 – 2020-08-19 (×6): 2 g via INTRAVENOUS
  Filled 2020-08-14: qty 2
  Filled 2020-08-14 (×5): qty 20

## 2020-08-14 MED ORDER — CEFAZOLIN SODIUM-DEXTROSE 1-4 GM/50ML-% IV SOLN: 1.0000 g | Freq: Three times a day (TID) | INTRAVENOUS | Status: DC

## 2020-08-14 MED ORDER — DEXAMETHASONE SODIUM PHOSPHATE 4 MG/ML IJ SOLN
4.0000 mg | Freq: Two times a day (BID) | INTRAMUSCULAR | Status: DC
  Administered 2020-08-14 – 2020-08-16 (×4): 4 mg via INTRAVENOUS
  Filled 2020-08-14 (×5): qty 1

## 2020-08-14 MED ORDER — SODIUM CHLORIDE 0.9% IV SOLUTION: Freq: Once | INTRAVENOUS | Status: AC

## 2020-08-14 MED ORDER — FOLIC ACID 1 MG PO TABS
1.0000 mg | ORAL_TABLET | Freq: Every day | ORAL | Status: DC
  Administered 2020-08-14 – 2020-08-16 (×3): 1 mg
  Filled 2020-08-14 (×4): qty 1

## 2020-08-14 NOTE — Progress Notes (Signed)
Pt placed on PSV 8/5 per wean protocol. Pt tolerating well at this time. RN aware.

## 2020-08-14 NOTE — Consult Note (Signed)
Reason for the request:    Anemia  HPI: 75 year old woman with history of diabetes, hypertension and dementia hospitalized on August 12, 2020 for stridor.  She had presented to her primary care provider with complaints of ecchymosis on the left hand on April 14.  She subsequently presented with symptoms of difficulty breathing that required intubation and admission to intensive care unit.  On admission her hemoglobin was 7.5, white cell count of 11.0 with platelets of 527.  Her MCV was 93.  DIC panel showed no evidence of schistocytes with elevated fibrinogen and PTT but normal INR.  Her hemoglobin was down to 6.8 on April 19 and received packed red cell transfusion with transient bump of hemoglobin to 7.2.  CBC on April 20 showed white cell count of 17.3, hemoglobin of 6.3 and a platelet of 475.  Her white cell count differential showed predominantly neutrophilia.  Her reticulocyte count was elevated by fraction but the absolute number is normal at 151.  Reticulocyte percentage was elevated at 6.4.  Sedimentation rate was elevated at 80.  Chemistry showed normal kidney function and elevated total bilirubin of 2.2 with fractionation showed predominantly indirect component.  LDH is 159, haptoglobin is pending and Coombs test is pending.  Clinically, she is unable to provide any history currently intubated.  Her husband was not present at bedside.     Past Medical History:  Diagnosis Date  . Allergic rhinitis   . Diabetes ([place])   . Diabetes mellitus without complication ([place])   . Hemorrhoids   . Hyperlipidemia   . Hypertension   . Probable Alzheimer's dementia without behavioral disturbance 09/15/2015   May 2017 [facility] score 8/30   . Seizures ([place])   :  Past Surgical History:  Procedure Laterality Date  . carpel tunnel release    . ECTOPIC PREGNANCY SURGERY    . TEE WITHOUT CARDIOVERSION N/A 06/07/2018   Procedure: TRANSESOPHAGEAL ECHOCARDIOGRAM (TEE);  Surgeon: Lelon Perla, MD;   Location: [facility] ENDOSCOPY;  Service: Cardiovascular;  Laterality: N/A;  :   Current Facility-Administered Medications:  .  0.9 %  sodium chloride infusion (Manually program via Guardrails IV Fluids), , Intravenous, Once, Freda Jackson B, MD .  cefTRIAXone (ROCEPHIN) 2 g in sodium chloride 0.9 % 100 mL IVPB, 2 g, Intravenous, Q24H, Freda Jackson B, MD, Last Rate: 10 mL/hr at 08/14/20 1200, Infusion Verify at 08/14/20 1200 .  chlorhexidine gluconate (MEDLINE KIT) (PERIDEX) 0.12 % solution 15 mL, 15 mL, Mouth Rinse, BID, Freddi Starr, MD, 15 mL at 08/14/20 0817 .  Chlorhexidine Gluconate Cloth 2 % PADS 6 each, 6 each, Topical, Q0600, Freddi Starr, MD, 6 each at 08/14/20 0506 .  dexamethasone (DECADRON) injection 4 mg, 4 mg, Intravenous, Q12H, Dewald, Cheryle Horsfall, MD .  docusate (COLACE) 50 MG/5ML liquid 100 mg, 100 mg, Per Tube, BID, Desai, Rahul P, PA-C, 100 mg at 08/14/20 0947 .  docusate sodium (COLACE) capsule 100 mg, 100 mg, Oral, BID PRN, Desai, Rahul P, PA-C .  feeding supplement (PROSource TF) liquid 90 mL, 90 mL, Per Tube, BID, Freddi Starr, MD, 90 mL at 08/14/20 0947 .  feeding supplement (VITAL HIGH PROTEIN) liquid 1,000 mL, 1,000 mL, Per Tube, Q24H, Freddi Starr, MD, 1,000 mL at 08/13/20 1806 .  fentaNYL (SUBLIMAZE) injection 50 mcg, 50 mcg, Intravenous, Q15 min PRN, Desai, Rahul P, PA-C, 50 mcg at 08/12/20 1553 .  fentaNYL (SUBLIMAZE) injection 50-200 mcg, 50-200 mcg, Intravenous, Q30 min PRN, Desai, Rahul P, PA-C,  50 mcg at 08/12/20 1718 .  insulin aspart (novoLOG) injection 0-15 Units, 0-15 Units, Subcutaneous, Q4H, Desai, Rahul P, PA-C, 3 Units at 08/14/20 0816 .  insulin glargine (LANTUS) injection 10 Units, 10 Units, Subcutaneous, Daily, Dewald, Jonathan B, MD .  MEDLINE mouth rinse, 15 mL, Mouth Rinse, 10 times per day, Freddi Starr, MD, 15 mL at 08/14/20 0924 .  multivitamin with minerals tablet 1 tablet, 1 tablet, Per Tube, Daily, Freddi Starr, MD, 1 tablet at 08/14/20 0947 .  pantoprazole (PROTONIX) injection 40 mg, 40 mg, Intravenous, Q12H, Candee Furbish, MD, 40 mg at 08/14/20 3428 .  polyethylene glycol (MIRALAX / GLYCOLAX) packet 17 g, 17 g, Per Tube, Daily, Desai, Rahul P, PA-C, 17 g at 08/14/20 0947 .  polyethylene glycol (MIRALAX / GLYCOLAX) packet 17 g, 17 g, Oral, Daily PRN, Desai, Rahul P, PA-C .  propofol (DIPRIVAN) 1000 MG/100ML infusion, 0-50 mcg/kg/min, Intravenous, Continuous, Desai, Rahul P, PA-C, Last Rate: 21.8 mL/hr at 08/14/20 1200, 40 mcg/kg/min at 08/14/20 1200:  Allergies  Allergen Reactions  . Metformin And Related Rash  . Chlorthalidone Rash    Per [facility] records, pt may have had a rash rxn to chlorthalidone  :  Family History  Problem Relation Age of Onset  . Heart disease Father   . Hypertension Father   . Hypertension Mother   . Colon cancer Other        aunt  :  Social History   Socioeconomic History  . Marital status: Married    Spouse name: Eduard Clos  . Number of children: 5  . Years of education: 28  . Highest education level: Not on file  Occupational History  . Occupation: Retired- Scientist, clinical (histocompatibility and immunogenetics): UNEMPLOYED  Tobacco Use  . Smoking status: Former Research scientist (life sciences)  . Smokeless tobacco: Never Used  Vaping Use  . Vaping Use: Never used  Substance and Sexual Activity  . Alcohol use: No  . Drug use: No  . Sexual activity: Not on file  Other Topics Concern  . Not on file  Social History Narrative    Merged History Encounter        Feels safe in relationship. Negative depression screen on 02/02/12.       Health Care POA:    Emergency Contact: husband, Eduard Clos [phone]   End of Life Plan: gave AD information 4/15   Who lives with you: husband and 3 foster girls ages 62, 34, 49(07/2013)   Any pets: none   Diet: Pt has a varied diet of protein, starch and vegetables.   Exercise: Pt does not have regular exercise routine.    Seatbelts: Pt reports wearing seatbelt when in  vehicles.    Hobbies: walking, eating out   Caffeine use: 1 cup coffee every morning   Diet-cola sometimes   Right handed   Social Determinants of Health   Financial Resource Strain: Low Risk   . Difficulty of Paying Living Expenses: Not very hard  Food Insecurity: No Food Insecurity  . Worried About Charity fundraiser in the Last Year: Never true  . Ran Out of Food in the Last Year: Never true  Transportation Needs: No Transportation Needs  . Lack of Transportation (Medical): No  . Lack of Transportation (Non-Medical): No  Physical Activity: Not on file  Stress: No Stress Concern Present  . Feeling of Stress : Not at all  Social Connections: Not on file  Intimate Partner Violence: Not on file  :  Pertinent items are noted in HPI.  Exam: Blood pressure 136/67, pulse 81, temperature 98 F (36.7 C), temperature source Axillary, resp. rate (!) 0, height $RemoveB'5\' 6"'FyelBSAX$  (1.676 m), weight 208 lb 5.4 oz (94.5 kg), SpO2 100 %.    General appearance: Intubated and sedated without distress. Head: atraumatic without any abnormalities. Eyes: conjunctivae/corneas clear. PERRL.  Sclera anicteric. Throat: lips, mucosa, and tongue normal; without oral thrush or ulcers. Resp: clear to auscultation bilaterally without rhonchi, wheezes or dullness to percussion. Cardio: regular rate and rhythm, S1, S2 normal, no murmur, click, rub or gallop GI: soft, non-tender; bowel sounds normal; no masses,  no organomegaly Skin: Ecchymosis noted on her right antecubital area.  Left hand ecchymosis was also noted. Lymph nodes: Cervical, supraclavicular, and axillary nodes normal. Neurologic: Grossly normal without any motor, sensory or deep tendon reflexes. Musculoskeletal: No joint deformity or effusion.  Recent Labs    08/13/20 0634 08/13/20 1951 08/14/20 0719  WBC 14.6*  --  17.3*  HGB 6.8* 7.2* 6.3*  HCT 23.0* 23.3* 21.1*  PLT 550*  --  475*   Recent Labs    08/13/20 0634 08/14/20 0719  NA 137 139   K 3.1* 4.0  CL 102 106  CO2 24 26  GLUCOSE 158* 212*  BUN 15 23  CREATININE 0.66 0.60  CALCIUM 9.6 9.1    Her peripheral smear was personally reviewed today and showed no evidence of schistocytes or red cell fragments.  Polychromasia was noted with very little spherocytosis however.  Thrombocytosis and left-sided shift was noted with neutrophilia.   CT ABDOMEN PELVIS WO CONTRAST  Result Date: 08/12/2020 CLINICAL DATA:  Shortness of breath, anemia, tracheal stenosis EXAM: CT CHEST, ABDOMEN AND PELVIS WITHOUT CONTRAST TECHNIQUE: Multidetector CT imaging of the chest, abdomen and pelvis was performed following the standard protocol without IV contrast. COMPARISON:  CT chest abdomen pelvis dated 12/26/2014 FINDINGS: CT CHEST FINDINGS Cardiovascular: The heart is normal in size. No pericardial effusion. No evidence of thoracic aortic aneurysm. Atherosclerotic calcifications of the aortic arch. Coronary atherosclerosis of the LAD and right coronary artery. Mediastinum/Nodes: 10 mm short axis right paratracheal node (series 3/image 14). 13 mm short axis subcarinal node (series 3/image 21). Bilateral hilar regions are poorly evaluated due to lack of intravenous contrast administration. Lungs/Pleura: Endotracheal tube terminates 3.0 cm above the carina. Mild paraseptal emphysematous changes. Mild patchy bilateral lower lobe opacities, likely atelectasis. Trace bilateral pleural effusions.  No pneumothorax. Musculoskeletal: Degenerative changes of the mid/lower thoracic spine. CT ABDOMEN PELVIS FINDINGS Hepatobiliary: Unenhanced liver is unremarkable. Status post cholecystectomy. No intrahepatic or extrahepatic ductal dilatation. Pancreas: Within normal limits. Spleen: Within normal limits. Adrenals/Urinary Tract: Adrenal glands are within normal limits. Kidneys are within normal limits. No renal, ureteral, or bladder calculi. No hydronephrosis. Bladder is decompressed with indwelling Foley catheter.  Stomach/Bowel: Enteric tube terminates in the distal gastric antrum. No evidence of bowel obstruction. Normal appendix (series 3/image 84). Vascular/Lymphatic: No evidence of abdominal aortic aneurysm. Atherosclerotic calcifications of the abdominal aorta and branch vessels. No suspicious abdominopelvic lymphadenopathy. Reproductive: Uterus is within normal limits. Left ovary is within normal limits.  No right adnexal mass. Other: No abdominopelvic ascites. Musculoskeletal: Degenerative changes of the lumbar spine. IMPRESSION: Limited evaluation due to lack of intravenous contrast administration. Small mediastinal nodes, indeterminate and possibly reactive. Mild bilateral lower lobe atelectasis with trace bilateral pleural effusions. Endotracheal tube terminates 3 cm above the carina. Enteric tube terminates in the distal gastric antrum. Bladder decompressed by indwelling Foley catheter. Electronically Signed  By: Julian Hy M.D.   On: 08/12/2020 18:28   CT SOFT TISSUE NECK W CONTRAST  Result Date: 08/12/2020 CLINICAL DATA:  Neck mass.  Tracheal stenosis. EXAM: CT NECK WITH CONTRAST TECHNIQUE: Multidetector CT imaging of the neck was performed using the standard protocol following the bolus administration of intravenous contrast. CONTRAST:  60m OMNIPAQUE IOHEXOL 300 MG/ML  SOLN COMPARISON:  CT angio head and neck 06/03/2018 FINDINGS: Pharynx and larynx: The patient is intubated. NG tube in place. Retained secretions are present in the oropharynx. The airway is not well evaluated due to intubation however there is loss of gas around the endotracheal tube above the cords which could represent laryngeal edema. Salivary glands: No inflammation, mass, or stone. Thyroid: Negative Lymph nodes: No enlarged lymph nodes in the neck. 8.5 mm left level 2 lymph node. 6 mm right level 2 lymph node. Small posterior lymph nodes bilaterally. Vascular: Normal vascular enhancement. Atherosclerotic calcification in the  carotid bifurcation bilaterally. Jugular vein patent bilaterally. Limited intracranial: Negative Visualized orbits: Not imaged Mastoids and visualized paranasal sinuses: Minimal mucosal edema Skeleton: Bilateral facet degeneration. No acute skeletal abnormality. Upper chest: Chest CT reported separately Other: Mild edema in the neck bilaterally. There is mild edema around the carotid artery bilaterally of uncertain significance. IMPRESSION: Endotracheal tube and NG tube in place. This obscures evaluation of the larynx however there is possible laryngeal edema. Retained secretions in the pharynx. No mass or adenopathy. Mild edema in the anterior neck bilaterally. Question allergic reaction. Electronically Signed   By: CFranchot GalloM.D.   On: 08/12/2020 18:18   CT CHEST WO CONTRAST  Result Date: 08/12/2020 CLINICAL DATA:  Shortness of breath, anemia, tracheal stenosis EXAM: CT CHEST, ABDOMEN AND PELVIS WITHOUT CONTRAST TECHNIQUE: Multidetector CT imaging of the chest, abdomen and pelvis was performed following the standard protocol without IV contrast. COMPARISON:  CT chest abdomen pelvis dated 12/26/2014 FINDINGS: CT CHEST FINDINGS Cardiovascular: The heart is normal in size. No pericardial effusion. No evidence of thoracic aortic aneurysm. Atherosclerotic calcifications of the aortic arch. Coronary atherosclerosis of the LAD and right coronary artery. Mediastinum/Nodes: 10 mm short axis right paratracheal node (series 3/image 14). 13 mm short axis subcarinal node (series 3/image 21). Bilateral hilar regions are poorly evaluated due to lack of intravenous contrast administration. Lungs/Pleura: Endotracheal tube terminates 3.0 cm above the carina. Mild paraseptal emphysematous changes. Mild patchy bilateral lower lobe opacities, likely atelectasis. Trace bilateral pleural effusions.  No pneumothorax. Musculoskeletal: Degenerative changes of the mid/lower thoracic spine. CT ABDOMEN PELVIS FINDINGS Hepatobiliary:  Unenhanced liver is unremarkable. Status post cholecystectomy. No intrahepatic or extrahepatic ductal dilatation. Pancreas: Within normal limits. Spleen: Within normal limits. Adrenals/Urinary Tract: Adrenal glands are within normal limits. Kidneys are within normal limits. No renal, ureteral, or bladder calculi. No hydronephrosis. Bladder is decompressed with indwelling Foley catheter. Stomach/Bowel: Enteric tube terminates in the distal gastric antrum. No evidence of bowel obstruction. Normal appendix (series 3/image 84). Vascular/Lymphatic: No evidence of abdominal aortic aneurysm. Atherosclerotic calcifications of the abdominal aorta and branch vessels. No suspicious abdominopelvic lymphadenopathy. Reproductive: Uterus is within normal limits. Left ovary is within normal limits.  No right adnexal mass. Other: No abdominopelvic ascites. Musculoskeletal: Degenerative changes of the lumbar spine. IMPRESSION: Limited evaluation due to lack of intravenous contrast administration. Small mediastinal nodes, indeterminate and possibly reactive. Mild bilateral lower lobe atelectasis with trace bilateral pleural effusions. Endotracheal tube terminates 3 cm above the carina. Enteric tube terminates in the distal gastric antrum. Bladder decompressed by indwelling Foley  catheter. Electronically Signed   By: Julian Hy M.D.   On: 08/12/2020 18:28       Assessment and Plan:   75 year old woman with:  1.  Severe anemia with normal MCV associated with mild elevation in her white cell count and platelets.  She does have follow-up element of follow-up possible hemolysis with elevated indirect bilirubin, reticulocyte count although normal LDH.  The differential diagnosis at this time was reviewed.  Based on her peripheral smear but I was personally reviewed autoimmune hemolysis remains a distinct possibility given the overall presentation.  Not immune hemolysis is considered less likely at this time given the lack  of Red cell fragmentation noted on her peripheral smear.  Other hemolytic process such as PNH is considered less likely.  Imaging studies did not show any evidence of thrombosis or marrow failure noted on her peripheral smear.  No evidence of hematuria or malignancy noted on her CT scan.     From a management standpoint, I agree with the current approach with supportive transfusion as needed.  If her Coombs testing is positive this will indicate potentially an autoimmune hemolytic process and I would treat her with steroids.  1 mg/kg daily of prednisone or equivalent Solu-Medrol would be recommended.    Transfusion would be for hemoglobin of less than 7 or hemodynamic compromise.  Continue to monitor her CBC daily.  Please obtain iron studies complete her anemia work-up.  Her peripheral smear does show significant hypochromia and she could have an element of iron deficiency in addition to hemolysis.  I would start folic acid 1 mg daily given suspected hemolysis.  I would consider peripheral blood flow cytometry to evaluate for any lymphoproliferative disorder or PNH although these are considered extremely unlikely given her CT scan and presentation.  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  No evidence of malignancy or an obvious lymphoproliferative disorder.  A bone marrow biopsy may be needed in the future.  Bacteremia could also be the cause of her hemolysis and treating the underlying condition is critical in managing her hemolytic process.   2.  Leukocytosis with thrombocytosis: These appear to be reactive at this time and likely related to acute illness and bacteremia.   3.  Stridor: Unclear etiology currently intubated.  She is currently on Decadron 4 mg every 12 hours.  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  Will continue to follow her progress     80  minutes were dedicated to this consult.  50% of the  time was face-to-face dedicated to reviewing laboratory data, imaging studies, discussing treatment options, discussing differential diagnosis and answering questions regarding future plan.

## 2020-08-14 NOTE — Progress Notes (Signed)
Subjective: Patient admitted for stridor ultimately intubated in the ER after I scoped her airway.  I noted what appeared to be submucosal hemorrhage extending from the nasopharynx down to the posterior glottis extending onto the arytenoid towers bilaterally.  It did not appear infected nor was there a discrete mass on exam; rather, this looked liked bruised mucosa suggestive of a spontaneous submucosal hemorrhage of unknown etiology.  She had several days of bruising on her arms and neck and was seen for this in the PCP office.  She remains intubated in the ICU and is being worked up by heme/onc and the critical care team.  Objective: Vital signs in last 24 hours: Temp:  [98 F (36.7 C)-99 F (37.2 C)] 98 F (36.7 C) (04/20 1150) Pulse Rate:  [80-104] 80 (04/20 1200) Resp:  [0-19] 19 (04/20 1200) BP: (127-166)/(57-83) 141/69 (04/20 1200) SpO2:  [100 %] 100 % (04/20 1200) FiO2 (%):  [30 %-40 %] 40 % (04/20 1200) Weight:  [94.5 kg] 94.5 kg (04/20 0204) Wt Readings from Last 1 Encounters:  08/14/20 94.5 kg    Intake/Output from previous day: 04/19 0701 - 04/20 0700 In: 3316.1 [I.V.:1817; Blood:315; NG/GT:40; IV Piggyback:1144.1] Out: 695 [Urine:695] Intake/Output this shift: Total I/O In: 1783.8 [I.V.:250.8; Blood:315; NG/GT:200; IV Piggyback:1018] Out: 570 [Urine:570]  Intubated Bruising noted along mandible Tongue not swollen Lips normal Neck without asymmetry or mass Trachea midline  Recent Labs    08/13/20 0634 08/13/20 1951 08/14/20 0719  WBC 14.6*  --  17.3*  HGB 6.8* 7.2* 6.3*  HCT 23.0* 23.3* 21.1*  PLT 550*  --  475*    Recent Labs    08/13/20 0634 08/14/20 0719  NA 137 139  K 3.1* 4.0  CL 102 106  CO2 24 26  GLUCOSE 158* 212*  BUN 15 23  CREATININE 0.66 0.60  CALCIUM 9.6 9.1    Medications: I have reviewed the patient's current medications.  Assessment/Plan: Acute airway obstruction likely secondary to submucosal hemorrhage  She needs to remain  intubated to allow this apparent blood to resorb after which she can likely be extubated.  If swelling persist beyond 4-5 days requiring persistent intubation, consider trach placement.  The dictated CT scan of the neck mentioned nothing about the submucosal edema and apparent collection of blood high in the airway.  I reviewed with the neuroradiologist today.  He does not think this represents an abscess - I agree.  He is going to amend the original report to comment on the upper airway abnormality.  Agree with coagulopathy workup - not sure what may have caused this apparent hemorrhage.   LOS: 2 days   Rejeana Brock 08/14/2020, 1:23 PM

## 2020-08-14 NOTE — Progress Notes (Signed)
NAME:  Paula Reed, MRN:  [id number], DOB:  1945-05-15, LOS: 2 ADMISSION DATE:  08/12/2020, CONSULTATION DATE:  08/12/20 REFERRING MD:  Paula Skeeter, CHIEF COMPLAINT:  stridor  History of Present Illness:  History limited to chart review and pt's elderly husband's bedside report due to intubation/NCI  Paula Reed is a 75 y.o. 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 Patient presented to the ED from home with husband for audible stridor with potential SOB and congestion. On 4/18, patient had dysphagia and trouble breathing that progressed to stridor. In ED, stridor was audible. Evaluated by ENT and found to have supraglottis swelling and discoloration c/w a hematoma. Was intubated 2/2 c/f airway closure. Husband denies trauma, smoke inhalation, and recent sick contracts. Denies recent changes to medications or new anticoagulant therapies. Notes that pt has a family history of diabetes and "black spots" that lead to amputation. Unclear if husband was referring to necrosis or other process. Denies/unsure of a family history of coagulopathies or hemoglobinopathies. Denies issues with pregnancy in the past. Patient has presented to PCP recently for Right arm swelling and husband reports swelling that has been waxing and waning in extremities.   CT neck demonstrated mild/possible laryngeal edema w/out any evidence of LAD. CT chest demonstrated small mediastinal nodes and mild bilateral lower lobe atelectasis with trace bilateral pleural effusions.   Pertinent  Medical History  T2DM, insulin dependent HTN HLD Probable Alzheimer's Dementia  Seizures  Fam Hx of DM   Significant Hospital Events: Including procedures, antibiotic start and stop dates in addition to other pertinent events   . 4/18 intubated for airway protection   Interim History  / Subjective:  NAEON. Patient appears comfortable. Husband updated at bedside and updated on plan of care.   Objective   Blood pressure 136/67, pulse 81, temperature 98.4 F (36.9 C), temperature source Axillary, resp. rate (!) 0, height _0  (1.676 m), weight 94.5 kg, SpO2 100 %.    Vent Mode: PRVC FiO2 (%):  [30 %] 30 % Set Rate:  [15 bmp] 15 bmp Vt Set:  [470 mL] 470 mL PEEP:  [5 cmH20] 5 cmH20 Plateau Pressure:  [9 cmH20-20 cmH20] 9 cmH20   Intake/Output Summary (Last 24 hours) at 08/14/2020 1124 Last data filed at 08/14/2020 1100 Gross per 24 hour  Intake 4389.49 ml  Output 880 ml  Net 3509.49 ml   Filed Weights   08/12/20 1958 08/13/20 0147 08/14/20 0204  Weight: 92.6 kg 92.6 kg 94.5 kg    Examination: General: sedated, NAD HENT: Anicteric, MMM, sedated, OGT and ETT in place, submandibular bruising along skin folds, increased from yesterday on L side c/w ecchomysoses, no LAD appreciated  Lungs: CTAB in anterior fields Cardiovascular: nml s1/s2, no m/r/g, no peripheral edema, no signs of splinter hemorrhages, janeway lesions, or osler nodes  Abdomen: soft, nondistended, no bruising on abd around insulin injection sites  Extremities: coarse, rough skin over the extensor surfaces of upper extremities that appears lichenous, dry skin, some bruising and scabbed wounds, palms and soles of feet significantly lighter than baseline skin tone, bruising over palms, bruising where blood pressure cuff is placed  Neuro: sedated, PERL GU: deferred  Labs/imaging that I havepersonally reviewed  (right click and "Reselect all SmartList Selections" daily)  CBC CMP Hemolysis labs   Resolved Hospital Problem list   NA  Assessment & Plan:  Paula Reed  Paula Reed is a 74 y.o. 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 CT neck and CT chest  without obvious focal process. Elevated ESR, CRP, leukocytosis, thrombocytosis, and anemia along with Strep bacteremia is c/w an acute infectious process with undetermined source. However, patient did not have an appropriate response to blood transfusion yesterday c/f a bleed or hemolytic anemia, particularly in the setting of easy bruising. Will continue to consider a vasculitis and autoimmune etiology.    Stridor  Laryngeal Edema and Possible Hematoma  Concern for airway occlusion. Will request assistance from ENT for possible extubation in OR when patient indicates more readiness to wean.  - Taper decadron - Continue w/ PVRC min setting support  - Wean propofol today to assess vent weaning  - CTM  Strep Bacteremia  Thrombocytosis   Blood cultures with Strep species (1/2; 2/2 GP chains) indicating a likely bacteremia. Elevated inflammatory markers. Thrombocytosis may be reactive. Afebrile since admission. Has not pursued colonoscopy screening for several years (last in 2012), which raises question as a potential gut source. Can also consider endocarditis but no known risk factors. CXR with some evidence of increased right lower lobe opacification - will CTM.  - Ceftriaxone  - Trend WBC  - CTM for fever  Easy Bruising  C/f Hemolytic Anemia  Down from a normal level a year ago. Recent value from 4/14 before hospitalization was 7.5 versus 6.8 now. Did not have appropriate response to transfusion of one unit yesterday. Hemolytic labs returning with some evidence of hemolysis (elevated Tbili and indirect bili, mild LDH, CRP, ESR elevated). Elevated PTT without iatrogenic reason (92 > 90 > 87). Will wait on addition labs and thoughts from hematology.  - Transfuse for Hb < 7 one      - 1 unit 4/19     - 1 unit 4/20 - F/up remaining hemolytic labs  - F/up autoimmune labs  - C/s hematology   T2DM A1c indicates controlled. Home reg is 15U Lantus qd and semaglutide 0.57m qThur.  - Continue with  SSI  - Add lantus 10 U     Best practice (right click and "Reselect all SmartList Selections" daily)  Diet:  Tube Feed  Pain/Anxiety/Delirium protocol (if indicated): Yes (RASS goal -1) VAP protocol (if indicated): Not indicated DVT prophylaxis: Contraindicated GI prophylaxis: PPI Glucose control:  SSI Yes Central venous access:  N/A Arterial line:  N/A Foley:  Yes, and it is still needed Mobility:  bed rest  PT consulted: N/A Last date of multidisciplinary goals of care discussion _0  Code Status:  full code Disposition: ICU  Labs   CBC: Recent Labs  Lab 08/08/20 1015 08/12/20 1232 08/12/20 1232 08/12/20 1615 08/12/20 1628 08/13/20 0336 08/13/20 0634 08/13/20 1951 08/14/20 0719  WBC 11.0* 13.1*  --   --   --   --  14.6*  --  17.3*  NEUTROABS  --  10.0*  --   --   --   --   --   --  14.7*  HGB 7.5* 7.0*   < >  --  7.8* 7.1* 6.8* 7.2* 6.3*  HCT 25.2* 25.0*   < >  --  23.0* 21.0* 23.0* 23.3* 21.1*  MCV 93 99.6  --   --   --   --  92.7  --  90.9  PLT 527* 556*  --  391  --   --  550*  --  475*   < > = values in this interval not displayed.    Basic Metabolic Panel: Recent Labs  Lab 08/12/20 1232 08/12/20 1628 08/13/20 0336 08/13/20 0634 08/13/20 1951 08/14/20 0719  NA 137 138 139 137  --  139  K 3.4* 3.4* 3.1* 3.1*  --  4.0  CL 99  --   --  102  --  106  CO2 23  --   --  24  --  26  GLUCOSE 175*  --   --  158*  --  212*  BUN 11  --   --  15  --  23  CREATININE 0.68  --   --  0.66  --  0.60  CALCIUM 9.4  --   --  9.6  --  9.1  MG  --   --   --  2.1 2.0 2.1  PHOS  --   --   --  3.4 3.6 2.8   GFR: Estimated Creatinine Clearance: 72.6 mL/min (by C-G formula based on SCr of 0.6 mg/dL). Recent Labs  Lab 08/08/20 1015 08/12/20 1232 08/13/20 0634 08/13/20 1000 08/14/20 0719  PROCALCITON  --   --   --  0.15  --   WBC 11.0* 13.1* 14.6*  --  17.3*    Liver Function Tests: Recent Labs  Lab 08/13/20 1858 08/14/20 0719  AST 16 16  ALT 11 12  ALKPHOS 68  61  BILITOT 2.2* 1.3*  PROT 7.1 6.7  ALBUMIN 3.0* 2.8*   No results for input(s): LIPASE, AMYLASE in the last 168 hours. No results for input(s): AMMONIA in the last 168 hours.  ABG    Component Value Date/Time   PHART 7.517 (H) 08/13/2020 0336   PCO2ART 31.1 (L) 08/13/2020 0336   PO2ART 112 (H) 08/13/2020 0336   HCO3 25.3 08/13/2020 0336   TCO2 26 08/13/2020 0336   ACIDBASEDEF 1.5 06/07/2017 0640   O2SAT 99.0 08/13/2020 0336     Coagulation Profile: Recent Labs  Lab 08/08/20 1015 08/12/20 1615 08/14/20 0719  INR 1.0 1.1  1.1 1.2    Cardiac Enzymes: No results for input(s): CKTOTAL, CKMB, CKMBINDEX, TROPONINI in the last 168 hours.  HbA1C: Hemoglobin A1C  Date/Time Value Ref Range Status  06/25/2020 10:45 AM 5.9 (A) 4.0 - 5.6 % Final  04/02/2020 11:40 AM 7.7 (A) 4.0 - 5.6 % Final  06/04/2016 12:00 AM 6.5  Final   HbA1c, POC (controlled diabetic range)  Date/Time Value Ref Range Status  09/05/2019 04:14 PM 8.6 (A) 0.0 - 7.0 % Final  06/23/2019 10:37 AM 12.7 (A) 0.0 - 7.0 % Final    CBG: Recent Labs  Lab 08/13/20 1626 08/13/20 1930 08/13/20 2306 08/14/20 0303 08/14/20 0753  GLUCAP 203* 185* 232* 206* 180*    Review of Systems:     Past Medical History:  She,  has a past medical history of Allergic rhinitis, Diabetes ([place]), Diabetes mellitus without complication ([place]), Hemorrhoids, Hyperlipidemia, Hypertension, Probable Alzheimer's dementia without behavioral disturbance (09/15/2015), and Seizures ([place]).   Surgical History:   Past Surgical History:  Procedure Laterality Date  . carpel tunnel release    . ECTOPIC PREGNANCY SURGERY    . TEE WITHOUT CARDIOVERSION N/A 06/07/2018   Procedure: TRANSESOPHAGEAL ECHOCARDIOGRAM (TEE);  Surgeon: Lelon Perla, MD;  Location: [facility] ENDOSCOPY;  Service: Cardiovascular;  Laterality: N/A;     Social History:   reports that she has quit smoking. She has never used smokeless tobacco.  She reports that she does not  drink alcohol and does not use drugs.   Family History:  Her family history includes Colon cancer in an other family member; Heart disease in her father; Hypertension in her father and mother.   Allergies Allergies  Allergen Reactions  . Metformin And Related Rash  . Chlorthalidone Rash    Per [facility] records, pt may have had a rash rxn to chlorthalidone     Home Medications  Prior to Admission medications   Medication Sig Start Date End Date Taking? Authorizing Provider  acetaminophen (TYLENOL) 500 MG tablet Take 1,000 mg by mouth every 6 (six) hours as needed for headache (pain).   Yes [provider]  amLODipine (NORVASC) 5 MG tablet Take 1 tablet (5 mg total) by mouth at bedtime. 09/05/19  Yes Lockamy, Christia Reading, DO  aspirin EC 81 MG tablet Take 81 mg by mouth every morning.   Yes [provider]  atorvastatin (LIPITOR) 80 MG tablet TAKE 1 TABLET BY MOUTH DAILY AT 6 PM. Patient taking differently: Take 80 mg by mouth at bedtime. 04/29/20  Yes Leeanne Rio, MD  insulin glargine (LANTUS) 100 UNIT/ML Solostar Pen Inject 15 Units into the skin daily. Patient taking differently: Inject 15 Units into the skin at bedtime. 06/25/20  Yes Leeanne Rio, MD  Semaglutide,0.25 or 0.5MG/DOS, (OZEMPIC, 0.25 OR 0.5 MG/DOSE,) 2 MG/1.5ML SOPN Inject 0.25 mg into the skin once a week. (Thursdays) Patient taking differently: Inject 0.25 mg into the skin every Thursday. 07/20/19  Yes Hensel, Jamal Collin, MD  triamcinolone ointment (KENALOG) 0.5 % Apply 1 application topically 2 (two) times daily as needed. Patient taking differently: Apply 1 application topically 2 (two) times daily as needed (itching/rash). 06/13/20  Yes Mullis, Kiersten P, DO  Blood Glucose Monitoring Suppl (ONETOUCH VERIO) w/Device KIT 1 kit by Does not apply route daily. Check sugar twice daily 01/30/19   Leeanne Rio, MD  glucose blood [facility] VERIO) test strip Check sugar twice daily 06/25/17    Leeanne Rio, MD  Insulin Pen Needle (PEN NEEDLES) 32G X 4 MM MISC Inject 1 Dose into the skin as directed. 07/20/19   Zenia Resides, MD  Insulin Syringe-Needle U-100 (BD INSULIN SYRINGE U/F) 31G X 5/16" 0.3 ML MISC USE TO INJECT LANTUS DAILY 09/22/18   Leeanne Rio, MD  [facility] LANCETS 08Y MISC Check sugar twice daily 06/25/17   Leeanne Rio, MD     Critical care time: 58 minutes     Domenick Bookbinder, MS4

## 2020-08-14 NOTE — Progress Notes (Signed)
Hgb 6.3, Dr. Francine Graven informed via face to face conversation. No new verbal orders received.

## 2020-08-15 ENCOUNTER — Ambulatory Visit: Payer: Medicare Other | Admitting: Licensed Clinical Social Worker

## 2020-08-15 DIAGNOSIS — D72829 Elevated white blood cell count, unspecified: Secondary | ICD-10-CM | POA: Diagnosis not present

## 2020-08-15 DIAGNOSIS — R061 Stridor: Secondary | ICD-10-CM | POA: Diagnosis not present

## 2020-08-15 DIAGNOSIS — D75839 Thrombocytosis, unspecified: Secondary | ICD-10-CM | POA: Diagnosis not present

## 2020-08-15 DIAGNOSIS — D649 Anemia, unspecified: Secondary | ICD-10-CM | POA: Diagnosis not present

## 2020-08-15 DIAGNOSIS — J9690 Respiratory failure, unspecified, unspecified whether with hypoxia or hypercapnia: Secondary | ICD-10-CM | POA: Diagnosis not present

## 2020-08-15 DIAGNOSIS — J988 Other specified respiratory disorders: Secondary | ICD-10-CM | POA: Diagnosis not present

## 2020-08-15 DIAGNOSIS — R791 Abnormal coagulation profile: Secondary | ICD-10-CM

## 2020-08-15 LAB — COMPREHENSIVE METABOLIC PANEL
ALT: 11 U/L (ref 0–44)
AST: 15 U/L (ref 15–41)
Albumin: 2.6 g/dL — ABNORMAL LOW (ref 3.5–5.0)
Alkaline Phosphatase: 55 U/L (ref 38–126)
Anion gap: 7 (ref 5–15)
BUN: 19 mg/dL (ref 8–23)
CO2: 26 mmol/L (ref 22–32)
Calcium: 8.7 mg/dL — ABNORMAL LOW (ref 8.9–10.3)
Chloride: 105 mmol/L (ref 98–111)
Creatinine, Ser: 0.44 mg/dL (ref 0.44–1.00)
GFR, Estimated: >60 mL/min (ref >60)
Glucose, Bld: 196 mg/dL — ABNORMAL HIGH (ref 70–99)
Potassium: 3.3 mmol/L — ABNORMAL LOW (ref 3.5–5.1)
Sodium: 138 mmol/L (ref 135–145)
Total Bilirubin: 0.9 mg/dL (ref 0.3–1.2)
Total Protein: 6.5 g/dL (ref 6.5–8.1)

## 2020-08-15 LAB — TYPE AND SCREEN
ABO/RH(D): A POS
Antibody Screen: NEGATIVE
Unit division: 0
Unit division: 0

## 2020-08-15 LAB — DIRECT ANTIGLOBULIN TEST (NOT AT ARMC)
DAT, IgG: NEGATIVE
DAT, complement: NEGATIVE

## 2020-08-15 LAB — CBC WITH DIFFERENTIAL/PLATELET
Abs Immature Granulocytes: 0.17 10*3/uL — ABNORMAL HIGH (ref 0.00–0.07)
Basophils Absolute: 0 10*3/uL (ref 0.0–0.1)
Basophils Relative: 0 %
Eosinophils Absolute: 0.1 10*3/uL (ref 0.0–0.5)
Eosinophils Relative: 0 %
HCT: 25.1 % — ABNORMAL LOW (ref 36.0–46.0)
Hemoglobin: 7.7 g/dL — ABNORMAL LOW (ref 12.0–15.0)
Immature Granulocytes: 1 %
Lymphocytes Relative: 22 %
Lymphs Abs: 3.1 10*3/uL (ref 0.7–4.0)
MCH: 28 pg (ref 26.0–34.0)
MCHC: 30.7 g/dL (ref 30.0–36.0)
MCV: 91.3 fL (ref 80.0–100.0)
Monocytes Absolute: 1.4 10*3/uL — ABNORMAL HIGH (ref 0.1–1.0)
Monocytes Relative: 10 %
Neutro Abs: 9.3 10*3/uL — ABNORMAL HIGH (ref 1.7–7.7)
Neutrophils Relative %: 67 %
Platelets: 441 10*3/uL — ABNORMAL HIGH (ref 150–400)
RBC: 2.75 MIL/uL — ABNORMAL LOW (ref 3.87–5.11)
RDW: 18.3 % — ABNORMAL HIGH (ref 11.5–15.5)
WBC: 14 10*3/uL — ABNORMAL HIGH (ref 4.0–10.5)
nRBC: 1 % — ABNORMAL HIGH (ref 0.0–0.2)

## 2020-08-15 LAB — CULTURE, BLOOD (ROUTINE X 2)

## 2020-08-15 LAB — BPAM RBC
Blood Product Expiration Date: 202205092359
Blood Product Expiration Date: 202205092359
ISSUE DATE / TIME: 202204191431
ISSUE DATE / TIME: 202204200918
Unit Type and Rh: 6200
Unit Type and Rh: 6200

## 2020-08-15 LAB — PHOSPHORUS: Phosphorus: 2.3 mg/dL — ABNORMAL LOW (ref 2.5–4.6)

## 2020-08-15 LAB — GLUCOSE, CAPILLARY
Glucose-Capillary: 187 mg/dL — ABNORMAL HIGH (ref 70–99)
Glucose-Capillary: 203 mg/dL — ABNORMAL HIGH (ref 70–99)
Glucose-Capillary: 199 mg/dL — ABNORMAL HIGH (ref 70–99)
Glucose-Capillary: 271 mg/dL — ABNORMAL HIGH (ref 70–99)
Glucose-Capillary: 225 mg/dL — ABNORMAL HIGH (ref 70–99)

## 2020-08-15 LAB — MAGNESIUM: Magnesium: 2 mg/dL (ref 1.7–2.4)

## 2020-08-15 MED ORDER — LACTATED RINGERS IV BOLUS
1000.0000 mL | Freq: Once | INTRAVENOUS | Status: AC
  Administered 2020-08-15: 1000 mL via INTRAVENOUS

## 2020-08-15 MED ORDER — POTASSIUM CHLORIDE 20 MEQ PO PACK
20.0000 meq | PACK | Freq: Once | ORAL | Status: AC
  Administered 2020-08-15: 20 meq
  Filled 2020-08-15: qty 1

## 2020-08-15 MED ORDER — SENNA 8.6 MG PO TABS
1.0000 | ORAL_TABLET | Freq: Every day | ORAL | Status: DC
  Administered 2020-08-15: 8.6 mg
  Filled 2020-08-15: qty 1

## 2020-08-15 MED ORDER — DEXTROSE 5 % IV SOLN
15.0000 mmol | Freq: Once | INTRAVENOUS | Status: AC
Start: 2020-08-15 — End: 2020-08-15
  Administered 2020-08-15: 15 mmol via INTRAVENOUS
  Filled 2020-08-15: qty 5

## 2020-08-15 NOTE — Progress Notes (Signed)
IP PROGRESS NOTE  Subjective:   No major clinical changes noted overnight.  Per the nursing staff no active bleeding noted at the endotracheal tube, IV sites or Foley catheter.  No excessive bruising noted at this time.  She is more awake and responsive.  Her husband is at bedside and does not add much in terms of new changes or observations.  Objective:  Vital signs in last 24 hours: Temp:  [97.7 F (36.5 C)-98.7 F (37.1 C)] 98 F (36.7 C) (04/21 0700) Pulse Rate:  [74-96] 86 (04/21 1117) Resp:  [11-24] 11 (04/21 1117) BP: (119-167)/(51-80) 156/68 (04/21 0800) SpO2:  [98 %-100 %] 100 % (04/21 1117) FiO2 (%):  [30 %-40 %] 30 % (04/21 1117) Weight:  [209 lb 7 oz (95 kg)] 209 lb 7 oz (95 kg) (04/21 0241) Weight change: 1 lb 1.6 oz (0.5 kg) Last BM Date: 08/14/20  Intake/Output from previous day: 04/20 0701 - 04/21 0700 In: 3058 [I.V.:655.1; Blood:315; NG/GT:880; IV Piggyback:1207.9] Out: 1285 [Urine:1285]    General appearance: Comfortable appearing without any discomfort Head: Normocephalic without any trauma Eyes: Pupils are equal and round reactive to light. Lymph nodes: No cervical, supraclavicular, inguinal or axillary lymphadenopathy.   Heart:regular rate and rhythm.  S1 and S2 without leg edema. Lung: Clear without any rhonchi or wheezes.  No dullness to percussion. Abdomin: Soft, nontender, nondistended with good bowel sounds.  No hepatosplenomegaly. Musculoskeletal: No joint deformity or effusion.  Full range of motion noted. Neurological: No deficits noted on motor, sensory and deep tendon reflex exam. Skin: Very limited ecchymosis noted.   Lab Results: Recent Labs    08/14/20 2121 08/15/20 0559  WBC 14.9* 14.0*  HGB 7.1* 7.7*  HCT 23.0* 25.1*  PLT 376 441*    BMET Recent Labs    08/14/20 0719 08/15/20 0559  NA 139 138  K 4.0 3.3*  CL 106 105  CO2 26 26  GLUCOSE 212* 196*  BUN 23 19  CREATININE 0.60 0.44  CALCIUM 9.1 8.7*     Studies/Results: DG CHEST PORT 1 VIEW  Result Date: 08/14/2020 CLINICAL DATA:  Atelectasis EXAM: PORTABLE CHEST 1 VIEW COMPARISON:  Radiograph 08/13/2020, CT 08/12/2020 FINDINGS: Endotracheal tube terminates 3.2 cm from the carina. Transesophageal tube tip and side port terminate distal to the GE junction, below the margins of imaging. Cholecystectomy clips in the right upper quadrant. Telemetry leads overlie the chest. Low volumes and atelectatic changes in the lung bases with some more patchy opacity in the right lower lung. Trace right pleural thickening remains. No pneumothorax. No left effusion. Stable cardiomediastinal contours. No acute osseous or soft tissue abnormality. IMPRESSION: Low volumes with atelectatic changes in the lung bases. More patchy opacity in the right lower lung could reflect atelectasis or early airspace disease. Trace right effusion. Electronically Signed   By: Kreg Shropshire M.D.   On: 08/14/2020 05:25    Medications: I have reviewed the patient's current medications.  Assessment/Plan:  75 year old woman with:  1.  Normocytic, normochromic anemia with element of hypochromia on her peripheral smear.  She did receive packed red cell transfusion with initially an appropriate response indicating possible hemolysis.  She does have elevated reticulocyte count with Coom[place] testing and haptoglobin is pending.  The differential diagnosis was reviewed and discussed today with the patient husband.  Her anemia could be related to immune hemolysis, nonimmune hemolysis or blood loss related to hematoma.  Her work-up is currently ongoing and in the meantime she will continue with supportive care.  2.  Elevated PTT with normal PT: Differential diagnosis includes an inhibitor versus a lupus anticoagulant.  Given the lack of thrombosis this is most likely related to a transient inhibitor versus possible bacteremia.  PTT mixing studies have been sent and currently pending.  Factor VIII  inhibitor is the most common findings in this setting.   From a management standpoint, she does not have any active bleeding and has received dexamethasone which could be helping at this time.  I recommend continue daily PTT and monitor for bleeding.  If a inhibitor is confirmed immunosuppressive therapy utilizing steroids and cyclophosphamide, and others could be considered.   3.  Stridor: Likely related to a hematoma.  Related to her coagulopathy.  Management by primary team and ENT.   We will continue to follow her progress.  Her husband was updated about the ongoing evaluation.   35  minutes were dedicated to this visit.  50% of time was spent face-to-face.  The time was dedicated to reviewing laboratory data, discussing differential diagnosis and management choices for the future.    LOS: 3 days   Eli Hose 08/15/2020, 11:21 AM

## 2020-08-15 NOTE — Progress Notes (Signed)
NAME:  Paula Reed, MRN:  [id number], DOB:  1945-08-31, LOS: 3 ADMISSION DATE:  08/12/2020, CONSULTATION DATE:  08/12/20 REFERRING MD:  Regenia Skeeter, CHIEF COMPLAINT:  stridor  History of Present Illness:  History limited to chart review and pt's elderly husband's bedside report due to intubation/NCI  Paula Reed is a 75 y.o. 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 Patient presented to the ED from home with husband for audible stridor with potential SOB and congestion. On 4/18, patient had dysphagia and trouble breathing that progressed to stridor. In ED, stridor was audible. Evaluated by ENT and found to have supraglottis swelling and discoloration c/w a hematoma. Was intubated 2/2 c/f airway closure. Husband denies trauma, smoke inhalation, and recent sick contracts. Denies recent changes to medications or new anticoagulant therapies. Notes that pt has a family history of diabetes and "black spots" that lead to amputation. Unclear if husband was referring to necrosis or other process. Denies/unsure of a family history of coagulopathies or hemoglobinopathies. Denies issues with pregnancy in the past. Patient has presented to PCP recently for Right arm swelling and husband reports swelling that has been waxing and waning in extremities.   CT neck demonstrated mild/possible laryngeal edema w/out any evidence of LAD. CT chest demonstrated small mediastinal nodes and mild bilateral lower lobe atelectasis with trace bilateral pleural effusions.   Pertinent  Medical History  T2DM, insulin dependent HTN HLD Probable Alzheimer's Dementia  Seizures  Fam Hx of DM   Significant Hospital Events: Including procedures, antibiotic start and stop dates in addition to other pertinent events   . 4/18 intubated for airway protection   Interim History  / Subjective:  NAEON. Patient is awake and indicates that she is comfortable. Husband updated at bedside and updated on plan of care.   Objective   Blood pressure (!) 156/68, pulse 87, temperature 98.7 F (37.1 C), temperature source Oral, resp. rate 20, height 5' 6"  (1.676 m), weight 95 kg, SpO2 100 %.    Vent Mode: PSV;CPAP FiO2 (%):  [30 %-40 %] 30 % Set Rate:  [15 bmp] 15 bmp Vt Set:  [470 mL] 470 mL PEEP:  [5 cmH20] 5 cmH20 Pressure Support:  [8 cmH20-10 cmH20] 10 cmH20 Plateau Pressure:  [18 cmH20] 18 cmH20   Intake/Output Summary (Last 24 hours) at 08/15/2020 1027 Last data filed at 08/15/2020 0800 Gross per 24 hour  Intake 1954.27 ml  Output 1025 ml  Net 929.27 ml   Filed Weights   08/13/20 0147 08/14/20 0204 08/15/20 0241  Weight: 92.6 kg 94.5 kg 95 kg    Examination: General: sedated, NAD HENT: Anicteric, MMM, sedated, OGT and ETT in place, submandibular bruising along skin folds improved, no LAD appreciated  Lungs: CTAB in anterior fields Cardiovascular: nml s1/s2, no m/r/g, no peripheral edema Abdomen: soft, nondistended Extremities: coarse, rough skin over the extensor surfaces of upper extremities that appears lichenous, dry skin, some bruising and scabbed wounds, palms and soles of feet significantly lighter than baseline skin tone, bruising over palms and at soles of feet Neuro: Awake, responds to some questions, EOMI grossly, 3/5 strength in extremities GU: foley in place  Labs/imaging that I havepersonally reviewed  (right click and "Reselect all SmartList Selections" daily)  CBC CMP Hemolysis labs   Resolved Hospital Problem list   NA  Assessment & Plan:  Paula Reed is a 75  y.o. 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 CT neck and CT chest without obvious focal process. Elevated ESR, CRP,  leukocytosis, thrombocytosis, and anemia with an increased retic count along with c/f Strep bacteremia is c/w an acute infectious process with undetermined source. Patient had a normal LDH with an outstanding haptoglobin, coombs test, lupus anticoag, and PTT inhib mixing study. ANA negative. Lower c/f hemolytic anemia at this time. Will give one more dose of dexamethasone primarily for laryngeal edema and then will d/c. Patient is tolerating PSV/CPAP mode and appears to have excellent lung mechanics. Will reach out to our ENT colleagues to see if we can do an air leak test and potentially extubate today or if waiting is preferable.   Stridor  Laryngeal Edema and Possible Hematoma  Concern for airway occlusion. Will request assistance from ENT for possible extubation in OR versus potential extubation today.   - D/c decadron after last dose this morning  - Continue w/ PSV/CPAP min setting support  - Continue w/ low dose propofol for vent comfort  - CTM  Strep Bacteremia  Thrombocytosis   Blood cultures with Strep species (1/2; 2/2 GP chains) indicating a potential bacteremia. Elevated inflammatory markers. Thrombocytosis may be reactive. Afebrile since admission. Has not pursued colonoscopy screening for several years (last in 2012), which raises question as a potential gut source. Can also consider endocarditis but no known risk factors. Given patient presentation, considering an infectious etiology is reasonable and we will continue with current management.  - Ceftriaxone  - Trend WBC  - CTM for fever  Easy Bruising  C/f Hemolytic Anemia  Down from a normal level a year ago. Recent value from 4/14 before hospitalization was 7.5 versus 6.8 now. Hb appears to have stabilized relatively. Hemolytic labs returning with some evidence of hemolysis (elevated Tbili and indirect bili > Tbili normalized, mild LDH, CRP, ESR elevated, increased retic) but in setting of negative ANA. Elevated PTT without  iatrogenic reason (92 > 90 > 87). Will wait on addition labs (haptoglobin, lupus anticoagulants, PTT inhib mixing studies) for diagnostic clarity. - Transfuse for Hb < 7      - 1 unit 4/19     - 1 unit 4/20 - F/up heme labs  - Hematology following, appreciate recommendations   T2DM A1c indicates controlled. Home reg is 15U Lantus qd and semaglutide 0.21m qThur. Should improve w/ d/c of dexamethasone. - Continue with SSI  - Continue lantus 10 U     Best practice (right click and "Reselect all SmartList Selections" daily)  Diet:  Tube Feed  Pain/Anxiety/Delirium protocol (if indicated): Yes (RASS goal -1) VAP protocol (if indicated): Not indicated DVT prophylaxis: Contraindicated GI prophylaxis: PPI Glucose control:  SSI Yes Central venous access:  N/A Arterial line:  N/A Foley:  Yes, and it is still needed Mobility:  bed rest  PT consulted: N/A Last date of multidisciplinary goals of care discussion []  Code Status:  full code Disposition: ICU  Labs   CBC: Recent Labs  Lab 08/12/20 1232 08/12/20 1615 08/12/20 1628 08/13/20 0634 08/13/20 1951 08/14/20 0719 08/14/20 1434 08/14/20 2121 08/15/20 0559  WBC 13.1*  --   --  14.6*  --  17.3*  --  14.9* 14.0*  NEUTROABS 10.0*  --   --   --   --  14.7*  --   --  9.3*  HGB 7.0*  --    < > 6.8* 7.2* 6.3* 8.1* 7.1* 7.7*  HCT 25.0*  --    < > 23.0* 23.3* 21.1* 26.0* 23.0* 25.1*  MCV 99.6  --   --  92.7  --  90.9  --  89.1 91.3  PLT 556* 391  --  550*  --  475*  --  376 441*   < > = values in this interval not displayed.    Basic Metabolic Panel: Recent Labs  Lab 08/12/20 1232 08/12/20 1628 08/13/20 0336 08/13/20 0634 08/13/20 1951 08/14/20 0719 08/14/20 1646 08/15/20 0559  NA 137 138 139 137  --  139  --  138  K 3.4* 3.4* 3.1* 3.1*  --  4.0  --  3.3*  CL 99  --   --  102  --  106  --  105  CO2 23  --   --  24  --  26  --  26  GLUCOSE 175*  --   --  158*  --  212*  --  196*  BUN 11  --   --  15  --  23  --  19   CREATININE 0.68  --   --  0.66  --  0.60  --  0.44  CALCIUM 9.4  --   --  9.6  --  9.1  --  8.7*  MG  --   --   --  2.1 2.0 2.1 2.1 2.0  PHOS  --   --   --  3.4 3.6 2.8 3.0 2.3*   GFR: Estimated Creatinine Clearance: 72.8 mL/min (by C-G formula based on SCr of 0.44 mg/dL). Recent Labs  Lab 08/13/20 0634 08/13/20 1000 08/14/20 0719 08/14/20 2121 08/15/20 0559  PROCALCITON  --  0.15  --   --   --   WBC 14.6*  --  17.3* 14.9* 14.0*    Liver Function Tests: Recent Labs  Lab 08/13/20 1858 08/14/20 0719 08/15/20 0559  AST 16 16 15   ALT 11 12 11   ALKPHOS 68 61 55  BILITOT 2.2* 1.3* 0.9  PROT 7.1 6.7 6.5  ALBUMIN 3.0* 2.8* 2.6*   No results for input(s): LIPASE, AMYLASE in the last 168 hours. No results for input(s): AMMONIA in the last 168 hours.  ABG    Component Value Date/Time   PHART 7.517 (H) 08/13/2020 0336   PCO2ART 31.1 (L) 08/13/2020 0336   PO2ART 112 (H) 08/13/2020 0336   HCO3 25.3 08/13/2020 0336   TCO2 26 08/13/2020 0336   ACIDBASEDEF 1.5 06/07/2017 0640   O2SAT 99.0 08/13/2020 0336     Coagulation Profile: Recent Labs  Lab 08/12/20 1615 08/14/20 0719  INR 1.1  1.1 1.2    Cardiac Enzymes: No results for input(s): CKTOTAL, CKMB, CKMBINDEX, TROPONINI in the last 168 hours.  HbA1C: Hemoglobin A1C  Date/Time Value Ref Range Status  06/25/2020 10:45 AM 5.9 (A) 4.0 - 5.6 % Final  04/02/2020 11:40 AM 7.7 (A) 4.0 - 5.6 % Final  06/04/2016 12:00 AM 6.5  Final   HbA1c, POC (controlled diabetic range)  Date/Time Value Ref Range Status  09/05/2019 04:14 PM 8.6 (A) 0.0 - 7.0 % Final  06/23/2019 10:37 AM 12.7 (A) 0.0 - 7.0 % Final    CBG: Recent Labs  Lab 08/14/20 1539 08/14/20 2014 08/14/20 2314 08/15/20 0437 08/15/20 0745  GLUCAP 148* 153* 115* 187* 203*    Review of Systems:     Past Medical History:  She,  has a past medical history of Allergic rhinitis, Diabetes ([place]), Diabetes mellitus without complication ([place]), Hemorrhoids,  Hyperlipidemia, Hypertension, Probable Alzheimer's dementia without behavioral disturbance (09/15/2015), and Seizures ([place]).   Surgical History:   Past Surgical History:  Procedure Laterality Date  . carpel tunnel release    . ECTOPIC PREGNANCY SURGERY    . TEE WITHOUT CARDIOVERSION N/A 06/07/2018   Procedure: TRANSESOPHAGEAL ECHOCARDIOGRAM (TEE);  Surgeon: Lelon Perla, MD;  Location: [facility];  Service: Cardiovascular;  Laterality: N/A;     Social History:   reports that she has quit smoking. She has never used smokeless tobacco. She reports that she does not drink alcohol and does not use drugs.   Family History:  Her family history includes Colon cancer in an other family member; Heart disease in her father; Hypertension in her father and mother.   Allergies Allergies  Allergen Reactions  . Metformin And Related Rash  . Chlorthalidone Rash    Per [facility] records, pt may have had a rash rxn to chlorthalidone     Home Medications  Prior to Admission medications   Medication Sig Start Date End Date Taking? Authorizing Provider  acetaminophen (TYLENOL) 500 MG tablet Take 1,000 mg by mouth every 6 (six) hours as needed for headache (pain).   Yes [provider]  amLODipine (NORVASC) 5 MG tablet Take 1 tablet (5 mg total) by mouth at bedtime. 09/05/19  Yes Lockamy, Christia Reading, DO  aspirin EC 81 MG tablet Take 81 mg by mouth every morning.   Yes [provider]  atorvastatin (LIPITOR) 80 MG tablet TAKE 1 TABLET BY MOUTH DAILY AT 6 PM. Patient taking differently: Take 80 mg by mouth at bedtime. 04/29/20  Yes Leeanne Rio, MD  insulin glargine (LANTUS) 100 UNIT/ML Solostar Pen Inject 15 Units into the skin daily. Patient taking differently: Inject 15 Units into the skin at bedtime. 06/25/20  Yes Leeanne Rio, MD  Semaglutide,0.25 or 0.5MG/DOS, (OZEMPIC, 0.25 OR 0.5 MG/DOSE,) 2 MG/1.5ML SOPN Inject 0.25 mg into the skin once a week.  (Thursdays) Patient taking differently: Inject 0.25 mg into the skin every Thursday. 07/20/19  Yes Hensel, Jamal Collin, MD  triamcinolone ointment (KENALOG) 0.5 % Apply 1 application topically 2 (two) times daily as needed. Patient taking differently: Apply 1 application topically 2 (two) times daily as needed (itching/rash). 06/13/20  Yes Mullis, Kiersten P, DO  Blood Glucose Monitoring Suppl (ONETOUCH VERIO) w/Device KIT 1 kit by Does not apply route daily. Check sugar twice daily 01/30/19   Leeanne Rio, MD  glucose blood [facility] VERIO) test strip Check sugar twice daily 06/25/17   Leeanne Rio, MD  Insulin Pen Needle (PEN NEEDLES) 32G X 4 MM MISC Inject 1 Dose into the skin as directed. 07/20/19   Zenia Resides, MD  Insulin Syringe-Needle U-100 (BD INSULIN SYRINGE U/F) 31G X 5/16" 0.3 ML MISC USE TO INJECT LANTUS DAILY 09/22/18   Leeanne Rio, MD  [facility] LANCETS 62X MISC Check sugar twice daily 06/25/17   Leeanne Rio, MD     Critical care time: 20 minutes     Domenick Bookbinder, MS4

## 2020-08-15 NOTE — Progress Notes (Signed)
Laboratory data in the last 12 hours reviewed.  Her hemoglobin remained overall stable ranging from 8.1 to currently 7.7 this morning.  Her white cell count shows mild elevation also related to dexamethasone.  Coagulation parameters showed normal PT and INR and still elevated PTT.  Her LDH is normal with haptoglobin and Coombs testing is pending.   Recommendations:  1.  Continue with supportive transfusion as needed.  Her hemoglobin appears to be stable at this time. 2.  I will hold off on any additional steroids unless there is a indication of autoimmune hemolysis.  Will await Coombs testing. 3.  We will obtain coagulation testing with the PTT mixing studies to rule out and inhibitor which could be causing her PTT prolongation and mucosal bleeding.   Will continue to follow

## 2020-08-15 NOTE — Progress Notes (Signed)
Pt placed on PSV 10/5 per wean protocol. Pt is tolerating well at this time. RN aware. RT to continue to monitor. 

## 2020-08-15 NOTE — Chronic Care Management (AMB) (Signed)
    Clinical Social Work  Care Management   Phone Outreach    08/15/2020 Name: Paula Reed MRN: [id number] DOB: 09/14/1945  Paula Reed is a 75 y.o. year old female who is a primary care patient of Pollie Meyer Estevan Ryder, MD .   F/U phone call today to assess needs, and progress with care plan goals.  Patient's husband is with patient in hospital.   Plan:Will reach out to patient's husband again in the next 2 to 3 weeks or sooner if needed.   Review of patient status, including review of consultants reports, relevant laboratory and other test results, and collaboration with appropriate care team members and the patient's provider was performed as part of comprehensive patient evaluation and provision of care management services.    Sammuel Hines, LCSW Care Management & Coordination  [facility]   [phone] 3:48 PM

## 2020-08-15 NOTE — Progress Notes (Signed)
Nutrition Follow-up  DOCUMENTATION CODES:   Obesity unspecified  INTERVENTION:   Continue tube feeds via OG tube: - Vital High Protein @ 40 ml/hr (960 ml/day) - ProSource TF 90 ml BID  Tube feeding regimen provides 1120 kcal, 128 grams of protein, and 802 ml of H2O.   Tube feeding regimen and current propofol provides 1490 total kcal (meets >100% of estimated needs).  - Continue MVI with minerals daily  NUTRITION DIAGNOSIS:   Inadequate oral intake related to inability to eat as evidenced by NPO status.  Ongoing, being addressed via TF  GOAL:   Provide needs based on ASPEN/SCCM guidelines  Met via TF  MONITOR:   TF tolerance  REASON FOR ASSESSMENT:   Consult,Ventilator Enteral/tube feeding initiation and management  ASSESSMENT:   Pt with PMH of Seizures, low grade Alzheimer's, HTN, HLD, and DM admitted with stridor preceded by dysphagia.  Discussed pt with RN and during ICU rounds. Per notes, pt with acute airway obstruction likely secondary to submucosal hemorrhage. Pt is being worked up for possible autoimmune hemolysis with tests pending. Per CCM, will continue mechanical ventilation support until 4/22 or 4/23.  OG tube remains in place with TF infusing.  Admit weight: 92.6 kg Current weight: 95 kg  Patient is currently intubated on ventilator support MV: 6.6 L/min Temp (24hrs), Avg:98.2 F (36.8 C), Min:97.7 F (36.5 C), Max:98.7 F (37.1 C)  Drips: Propofol: 14 ml/hr (provides 370 kcal daily from lipid)  Medications reviewed and include: IV decadron, colace, folic acid, SSI q 4 hours, lantus 10 units daily, MVI with minerals, IV protonix, miralax, senna, IV abx, IV potassium phospate 15 mmol once  Labs reviewed: potassium 3.3, phosphorus 2.3, hemoglobin 7.7 CBG's: 115-271 x 24 hours  UOP: 1285 ml x 24 hours I/O's: +5.8 L since admit  Diet Order:   Diet Order            Diet NPO time specified  Diet effective now                  EDUCATION NEEDS:   No education needs have been identified at this time  Skin:  Skin Assessment: Reviewed RN Assessment  Last BM:  08/14/20  Height:   Ht Readings from Last 1 Encounters:  08/12/20 5' 6"  (1.676 m)    Weight:   Wt Readings from Last 1 Encounters:  08/15/20 95 kg    Ideal Body Weight:  59 kg  BMI:  Body mass index is 33.8 kg/m.  Estimated Nutritional Needs:   Kcal:  1300  Protein:  118 grams  Fluid:  >1.5 L/day    Gustavus Bryant, MS, RD, LDN Inpatient Clinical Dietitian Please see AMiON for contact information.

## 2020-08-16 DIAGNOSIS — D75839 Thrombocytosis, unspecified: Secondary | ICD-10-CM | POA: Diagnosis not present

## 2020-08-16 DIAGNOSIS — J988 Other specified respiratory disorders: Secondary | ICD-10-CM | POA: Diagnosis not present

## 2020-08-16 DIAGNOSIS — D649 Anemia, unspecified: Secondary | ICD-10-CM | POA: Diagnosis not present

## 2020-08-16 DIAGNOSIS — R061 Stridor: Secondary | ICD-10-CM | POA: Diagnosis not present

## 2020-08-16 DIAGNOSIS — D72829 Elevated white blood cell count, unspecified: Secondary | ICD-10-CM | POA: Diagnosis not present

## 2020-08-16 DIAGNOSIS — J9690 Respiratory failure, unspecified, unspecified whether with hypoxia or hypercapnia: Secondary | ICD-10-CM | POA: Diagnosis not present

## 2020-08-16 LAB — TRIGLYCERIDES: Triglycerides: 146 mg/dL (ref <150)

## 2020-08-16 LAB — PTT-LA MIX: PTT-LA Mix: 104.5 s — ABNORMAL HIGH (ref 0.0–48.9)

## 2020-08-16 LAB — GLUCOSE, CAPILLARY
Glucose-Capillary: 137 mg/dL — ABNORMAL HIGH (ref 70–99)
Glucose-Capillary: 141 mg/dL — ABNORMAL HIGH (ref 70–99)
Glucose-Capillary: 191 mg/dL — ABNORMAL HIGH (ref 70–99)
Glucose-Capillary: 194 mg/dL — ABNORMAL HIGH (ref 70–99)
Glucose-Capillary: 201 mg/dL — ABNORMAL HIGH (ref 70–99)
Glucose-Capillary: 202 mg/dL — ABNORMAL HIGH (ref 70–99)
Glucose-Capillary: 212 mg/dL — ABNORMAL HIGH (ref 70–99)

## 2020-08-16 LAB — COMPREHENSIVE METABOLIC PANEL
ALT: 10 U/L (ref 0–44)
AST: 16 U/L (ref 15–41)
Albumin: 2.8 g/dL — ABNORMAL LOW (ref 3.5–5.0)
Alkaline Phosphatase: 55 U/L (ref 38–126)
Anion gap: 7 (ref 5–15)
BUN: 24 mg/dL — ABNORMAL HIGH (ref 8–23)
CO2: 28 mmol/L (ref 22–32)
Calcium: 9 mg/dL (ref 8.9–10.3)
Chloride: 104 mmol/L (ref 98–111)
Creatinine, Ser: 0.53 mg/dL (ref 0.44–1.00)
GFR, Estimated: >60 mL/min (ref >60)
Glucose, Bld: 206 mg/dL — ABNORMAL HIGH (ref 70–99)
Potassium: 4 mmol/L (ref 3.5–5.1)
Sodium: 139 mmol/L (ref 135–145)
Total Bilirubin: 0.9 mg/dL (ref 0.3–1.2)
Total Protein: 6.5 g/dL (ref 6.5–8.1)

## 2020-08-16 LAB — LUPUS ANTICOAGULANT PANEL
DRVVT: 39.7 s (ref 0.0–47.0)
PTT Lupus Anticoagulant: 116.4 s — ABNORMAL HIGH (ref 0.0–51.9)

## 2020-08-16 LAB — CBC WITH DIFFERENTIAL/PLATELET
Abs Immature Granulocytes: 0.17 10*3/uL — ABNORMAL HIGH (ref 0.00–0.07)
Basophils Absolute: 0 10*3/uL (ref 0.0–0.1)
Basophils Relative: 0 %
Eosinophils Absolute: 0 10*3/uL (ref 0.0–0.5)
Eosinophils Relative: 0 %
HCT: 24.4 % — ABNORMAL LOW (ref 36.0–46.0)
Hemoglobin: 7.6 g/dL — ABNORMAL LOW (ref 12.0–15.0)
Immature Granulocytes: 1 %
Lymphocytes Relative: 15 %
Lymphs Abs: 2.1 10*3/uL (ref 0.7–4.0)
MCH: 28.4 pg (ref 26.0–34.0)
MCHC: 31.1 g/dL (ref 30.0–36.0)
MCV: 91 fL (ref 80.0–100.0)
Monocytes Absolute: 1.2 10*3/uL — ABNORMAL HIGH (ref 0.1–1.0)
Monocytes Relative: 9 %
Neutro Abs: 10.3 10*3/uL — ABNORMAL HIGH (ref 1.7–7.7)
Neutrophils Relative %: 75 %
Platelets: 426 10*3/uL — ABNORMAL HIGH (ref 150–400)
RBC: 2.68 MIL/uL — ABNORMAL LOW (ref 3.87–5.11)
RDW: 17.7 % — ABNORMAL HIGH (ref 11.5–15.5)
WBC: 13.8 10*3/uL — ABNORMAL HIGH (ref 4.0–10.5)
nRBC: 1.2 % — ABNORMAL HIGH (ref 0.0–0.2)

## 2020-08-16 LAB — HEXAGONAL PHASE PHOSPHOLIPID: Hexagonal Phase Phospholipid: 10 s (ref 0–11)

## 2020-08-16 LAB — FACTOR 8 ASSAY: Coagulation Factor VIII: <1 % — ABNORMAL LOW (ref 56–140)

## 2020-08-16 LAB — MAGNESIUM: Magnesium: 2 mg/dL (ref 1.7–2.4)

## 2020-08-16 LAB — PHOSPHORUS: Phosphorus: 3.5 mg/dL (ref 2.5–4.6)

## 2020-08-16 LAB — APTT: aPTT: 78 seconds — ABNORMAL HIGH (ref 24–36)

## 2020-08-16 MED ORDER — ORAL CARE MOUTH RINSE
15.0000 mL | Freq: Two times a day (BID) | OROMUCOSAL | Status: DC
  Administered 2020-08-16 – 2020-08-20 (×7): 15 mL via OROMUCOSAL

## 2020-08-16 MED ORDER — DEXAMETHASONE SODIUM PHOSPHATE 4 MG/ML IJ SOLN
4.0000 mg | INTRAMUSCULAR | Status: DC
  Administered 2020-08-16: 4 mg via INTRAVENOUS
  Filled 2020-08-16: qty 1

## 2020-08-16 MED ORDER — LACTATED RINGERS IV SOLN: INTRAVENOUS | Status: DC

## 2020-08-16 NOTE — Progress Notes (Signed)
Pt extubated to 4L nasal cannula. RN was at bedside. BBS heard throughout. No stridor noted.

## 2020-08-16 NOTE — Progress Notes (Signed)
IP PROGRESS NOTE  Subjective:   Patient remains intubated although overall clinically stable.  No bleeding reported per nursing staff.  Her IV sites appear without any bruising or bleeding.  Catheter sites are all appeared normal.  No ecchymosis or petechiae noted.  Objective:  Vital signs in last 24 hours: Temp:  [98.4 F (36.9 C)-98.8 F (37.1 C)] 98.5 F (36.9 C) (04/22 0735) Pulse Rate:  [78-100] 80 (04/22 0600) Resp:  [11-24] 15 (04/22 0600) BP: (101-170)/(49-76) 135/61 (04/22 0600) SpO2:  [96 %-100 %] 100 % (04/22 0600) FiO2 (%):  [30 %] 30 % (04/22 0315) Weight change:  Last BM Date: 08/14/20  Intake/Output from previous day: 04/21 0701 - 04/22 0700 In: 1761.3 [I.V.:112.2; NG/GT:440; IV Piggyback:1209.1] Out: 1827 [Urine:1827]     General appearance:  No distress.  Remains intubated. Head: Atraumatic without abnormalities Oropharynx: ET tube in place. Eyes: No scleral icterus. Lymph nodes: No lymphadenopathy noted in the cervical, supraclavicular, or axillary nodes Heart:regular rate and rhythm, without any murmurs or gallops.   Lung: Clear to auscultation without any rhonchi, wheezes or dullness to percussion. Abdomin: Soft, nontender without any shifting dullness or ascites. Musculoskeletal: No clubbing or cyanosis. Neurological: No motor or sensory deficits. Skin: Unchanged with very little ecchymosis noted.   Lab Results: Recent Labs    08/15/20 0559 08/16/20 0529  WBC 14.0* 13.8*  HGB 7.7* 7.6*  HCT 25.1* 24.4*  PLT 441* 426*    BMET Recent Labs    08/15/20 0559 08/16/20 0529  NA 138 139  K 3.3* 4.0  CL 105 104  CO2 26 28  GLUCOSE 196* 206*  BUN 19 24*  CREATININE 0.44 0.53  CALCIUM 8.7* 9.0    Studies/Results: No results found.  Medications: I have reviewed the patient's current medications.  Assessment/Plan:  75 year old woman with:  1.  Anemia noted on admission with a hemoglobin of 7.5 in the setting of ecchymosis as well as  pharyngeal hematoma.   Work-up for autoimmune hemolytic anemia is negative at this time.  Her Coombs testing is negative with normal LDH and normal bilirubin.  Her elevated indirect bilirubin has improved.  She could have a transient hemolysis related to her hematoma but no active hemolysis noted at this time.  For the time being I recommend continued supportive transfusion and check iron stores given her hypochromia on peripheral smear.  2.  Elevated PTT with normal PT: Work-up is ongoing for possible inhibitor.  Factor VIII inhibitors are the most common in this particular age group and can present with severe bleeding.  Her PTT is improving which could be spontaneous or may be related to her dexamethasone dosing.  Will await PTT mixing studies as well as factor VIII inhibitor titers before any further recommendations.  She does not have any active bleeding at this time clinically.  She does not require any factor replacement or DDAVP at this time.  She is already on dexamethasone for stridor.   3.  Stridor: She remains intubated and managed by ICU team and ENT.  She continues to be on dexamethasone which could be helping her autoimmune process   We will continue to follow her over the weekend and add any additional recommendations.   25  minutes were dedicated to this visit.  50% of time was spent face-to-face.  The time was spent on reviewing her disease status, discussing treatment options and reviewing laboratory results.    LOS: 4 days   Eli Hose 08/16/2020, 8:08 AM

## 2020-08-16 NOTE — Progress Notes (Addendum)
Inpatient Diabetes Program Recommendations  AACE/ADA: New Consensus Statement on Inpatient Glycemic Control (2015)  Target Ranges:  Prepandial:   less than 140 mg/dL      Peak postprandial:   less than 180 mg/dL (1-2 hours)      Critically ill patients:  140 - 180 mg/dL   Lab Results  Component Value Date   GLUCAP 191 (H) 08/16/2020   HGBA1C 5.9 (A) 06/25/2020    Review of Glycemic Control Results for Paula Reed, Paula Reed (MRN [id number]) as of 08/16/2020 09:02  Ref. Range 08/15/2020 07:45 08/15/2020 11:34 08/15/2020 15:21 08/15/2020 19:33 08/15/2020 23:59 08/16/2020 03:41 08/16/2020 07:33  Glucose-Capillary Latest Ref Range: 70 - 99 mg/dL 956 (H)  Novolog 5 units 199 (H)  Novolog 3 units 271 (H)  Novolog 8 units 225 (H)  Novolog 5 units 201 (H)  Novolog 5 units 212 (H)  Novolog 5 units 191 (H)  Novolog 3 units   Diabetes history: DM 2 Outpatient Diabetes medications: Lantus 15 units qhs, Ozempic 0.25 QThursday Current orders for Inpatient glycemic control:  Lantus 10 units Novolog 0-15 units Q4 hours  Vital HP 40 ml/hour Decadron 4 mg Q12 hours  Inpatient Diabetes Program Recommendations:    -  May consider adding Novolog 4 units Q4 hours Tube Feed Coverage  Thanks,  Christena Deem RN, MSN, BC-ADM Inpatient Diabetes Coordinator Team Pager [phone] (8a-5p)

## 2020-08-16 NOTE — Progress Notes (Signed)
NAME:  Paula Reed, MRN:  [id number], DOB:  03-01-1946, LOS: 4 ADMISSION DATE:  08/12/2020, CONSULTATION DATE:  08/12/20 REFERRING MD:  Regenia Skeeter, CHIEF COMPLAINT:  stridor  History of Present Illness:  History limited to chart review and pt's elderly husband's bedside report due to intubation/NCI  Paula Reed is a 75 y.o. 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 Patient presented to the ED from home with husband for audible stridor with potential SOB and congestion. On 4/18, patient had dysphagia and trouble breathing that progressed to stridor. In ED, stridor was audible. Evaluated by ENT and found to have supraglottis swelling and discoloration c/w a hematoma. Was intubated 2/2 c/f airway closure. Husband denies trauma, smoke inhalation, and recent sick contracts. Denies recent changes to medications or new anticoagulant therapies. Notes that pt has a family history of diabetes and "black spots" that lead to amputation. Unclear if husband was referring to necrosis or other process. Denies/unsure of a family history of coagulopathies or hemoglobinopathies. Denies issues with pregnancy in the past. Patient has presented to PCP recently for Right arm swelling and husband reports swelling that has been waxing and waning in extremities.   CT neck demonstrated mild/possible laryngeal edema w/out any evidence of LAD. CT chest demonstrated small mediastinal nodes and mild bilateral lower lobe atelectasis with trace bilateral pleural effusions.   Pertinent  Medical History  T2DM, insulin dependent HTN HLD Probable Alzheimer's Dementia  Seizures  Fam Hx of DM   Significant Hospital Events: Including procedures, antibiotic start and stop dates in addition to other pertinent events   . 4/18 intubated for airway protection  . 4/22 planned  extubation   Interim History / Subjective:  NAEON. Patient is awake and indicates that she is comfortable. Passed cuff leak test at bedside. Will plan on extubation today.   Objective   Blood pressure 137/67, pulse 78, temperature 98.5 F (36.9 C), temperature source Axillary, resp. rate 16, height 5' 6"  (1.676 m), weight 95 kg, SpO2 100 %.    Vent Mode: PSV;CPAP FiO2 (%):  [30 %] 30 % Set Rate:  [15 bmp] 15 bmp Vt Set:  [470 mL] 470 mL PEEP:  [5 cmH20] 5 cmH20 Pressure Support:  [10 cmH20] 10 cmH20 Plateau Pressure:  [20 JOA41-66 cmH20] 21 cmH20   Intake/Output Summary (Last 24 hours) at 08/16/2020 0949 Last data filed at 08/16/2020 0900 Gross per 24 hour  Intake 1993.84 ml  Output 1827 ml  Net 166.84 ml   Filed Weights   08/13/20 0147 08/14/20 0204 08/15/20 0241  Weight: 92.6 kg 94.5 kg 95 kg    Examination: General: sedated, NAD HENT: Anicteric, MMM, sedated, OGT and ETT in place, submandibular bruising along skin folds improved, no LAD appreciated  Lungs: CTAB in anterior fields Cardiovascular: nml s1/s2, no m/r/g, no peripheral edema Abdomen: soft, nondistended Extremities: coarse, rough skin over the extensor surfaces of upper extremities that appears lichenous, dry skin, some bruising and scabbed wounds, palms and soles of feet significantly lighter than baseline skin tone, bruising over palms and at soles of feet. BP cuff switched to L arm shows new signs of bruising. R arm bruising from BP cuff improved  Neuro: Awake, responds to some questions, EOMI grossly, 3/5 strength in extremities GU: ext foley in place  Labs/imaging that I havepersonally reviewed  (right click and "Reselect all SmartList Selections" daily)  CBC CMP Hemolysis labs   Resolved Hospital Problem list   NA  Assessment & Plan:  Paula Reed is a 76 y.o. female with a relevant PMHx of insulin dependent T2DM (A1c 5.9 06/2020), HTN, HLD, mild probable Alzheimer's dementia w/out behavioral  disturbance (08/2015), and seizures that presented with stridor. CT neck and CT chest without obvious focal process. Elevated ESR, CRP, leukocytosis, thrombocytosis, and anemia with an increased retic count along with c/f Strep bacteremia is c/w an acute infectious process with undetermined source. Patient had a normal LDH with an outstanding haptoglobin, coombs test, lupus anticoag, and PTT inhib mixing study. ANA negative. Lower c/f hemolytic anemia at this time. Will plan on extubation today given positive air leak test.   Stridor  Laryngeal Edema and Possible Hematoma  Intubate for concern for airway occlusion. Will plan on extubation today  - CTM  C/f Strep Bacteremia  Thrombocytosis   Blood cultures with Strep species (1/2; 2/2 GP chains) indicating a potential bacteremia. Elevated inflammatory markers. Thrombocytosis may be reactive. Afebrile since admission. Has not pursued colonoscopy screening for several years (last in 2012), which raises question as a potential gut source. Can also consider endocarditis but no known risk factors. Given patient presentation, considering an infectious etiology is reasonable and we will continue with current management.  - Ceftriaxone (4/20 - 4/24) - Trend WBC  - CTM for fever  Easy Bruising  C/f Hemolytic Anemia  Down from a normal level a year ago. Recent value from 4/14 before hospitalization was 7.5 versus 6.8 now. Hb appears to have stabilized relatively. Hemolytic labs returning with some evidence of hemolysis (elevated Tbili and indirect bili > Tbili normalized, mild LDH, CRP, ESR elevated, increased retic) but in setting of negative ANA. Elevated PTT without iatrogenic reason (92 > 90 > 87). Will wait on addition labs (haptoglobin, lupus anticoagulants, PTT inhib mixing studies) for diagnostic clarity. - Transfuse for Hb < 7      - 1 unit 4/19     - 1 unit 4/20 - F/up heme labs  - Hematology following, appreciate recommendations   T2DM A1c  indicates controlled. Home reg is 15U Lantus qd and semaglutide 0.30m qThur. Should improve w/ d/c of dexamethasone. - Continue with SSI  - Increase lantus 15 U     Best practice (right click and "Reselect all SmartList Selections" daily)  Diet:  Tube Feed  Pain/Anxiety/Delirium protocol (if indicated): Yes (RASS goal -1) VAP protocol (if indicated): Not indicated DVT prophylaxis: Contraindicated GI prophylaxis: PPI Glucose control:  SSI Yes Central venous access:  N/A Arterial line:  N/A Foley:  Yes, and it is still needed Mobility:  bed rest  PT consulted: N/A Last date of multidisciplinary goals of care discussion []  Code Status:  full code Disposition: ICU  Labs   CBC: Recent Labs  Lab 08/12/20 1232 08/12/20 1615 08/13/20 0634 08/13/20 1951 08/14/20 0719 08/14/20 1434 08/14/20 2121 08/15/20 0559 08/16/20 0529  WBC 13.1*  --  14.6*  --  17.3*  --  14.9* 14.0* 13.8*  NEUTROABS 10.0*  --   --   --  14.7*  --   --  9.3* 10.3*  HGB 7.0*   < > 6.8*   < > 6.3* 8.1* 7.1* 7.7* 7.6*  HCT 25.0*   < > 23.0*   < > 21.1* 26.0* 23.0* 25.1* 24.4*  MCV 99.6  --  92.7  --  90.9  --  89.1 91.3 91.0  PLT 556*   < >  550*  --  475*  --  376 441* 426*   < > = values in this interval not displayed.    Basic Metabolic Panel: Recent Labs  Lab 08/12/20 1232 08/12/20 1628 08/13/20 0336 08/13/20 2536 08/13/20 0634 08/13/20 1951 08/14/20 0719 08/14/20 1646 08/15/20 0559 08/16/20 0529  NA 137   < > 139 137  --   --  139  --  138 139  K 3.4*   < > 3.1* 3.1*  --   --  4.0  --  3.3* 4.0  CL 99  --   --  102  --   --  106  --  105 104  CO2 23  --   --  24  --   --  26  --  26 28  GLUCOSE 175*  --   --  158*  --   --  212*  --  196* 206*  BUN 11  --   --  15  --   --  23  --  19 24*  CREATININE 0.68  --   --  0.66  --   --  0.60  --  0.44 0.53  CALCIUM 9.4  --   --  9.6  --   --  9.1  --  8.7* 9.0  MG  --   --   --  2.1   < > 2.0 2.1 2.1 2.0 2.0  PHOS  --   --   --  3.4   < > 3.6  2.8 3.0 2.3* 3.5   < > = values in this interval not displayed.   GFR: Estimated Creatinine Clearance: 72.8 mL/min (by C-G formula based on SCr of 0.53 mg/dL). Recent Labs  Lab 08/13/20 1000 08/14/20 0719 08/14/20 2121 08/15/20 0559 08/16/20 0529  PROCALCITON 0.15  --   --   --   --   WBC  --  17.3* 14.9* 14.0* 13.8*    Liver Function Tests: Recent Labs  Lab 08/13/20 1858 08/14/20 0719 08/15/20 0559 08/16/20 0529  AST 16 16 15 16   ALT 11 12 11 10   ALKPHOS 68 61 55 55  BILITOT 2.2* 1.3* 0.9 0.9  PROT 7.1 6.7 6.5 6.5  ALBUMIN 3.0* 2.8* 2.6* 2.8*   No results for input(s): LIPASE, AMYLASE in the last 168 hours. No results for input(s): AMMONIA in the last 168 hours.  ABG    Component Value Date/Time   PHART 7.517 (H) 08/13/2020 0336   PCO2ART 31.1 (L) 08/13/2020 0336   PO2ART 112 (H) 08/13/2020 0336   HCO3 25.3 08/13/2020 0336   TCO2 26 08/13/2020 0336   ACIDBASEDEF 1.5 06/07/2017 0640   O2SAT 99.0 08/13/2020 0336     Coagulation Profile: Recent Labs  Lab 08/12/20 1615 08/14/20 0719  INR 1.1  1.1 1.2    Cardiac Enzymes: No results for input(s): CKTOTAL, CKMB, CKMBINDEX, TROPONINI in the last 168 hours.  HbA1C: Hemoglobin A1C  Date/Time Value Ref Range Status  06/25/2020 10:45 AM 5.9 (A) 4.0 - 5.6 % Final  04/02/2020 11:40 AM 7.7 (A) 4.0 - 5.6 % Final  06/04/2016 12:00 AM 6.5  Final   HbA1c, POC (controlled diabetic range)  Date/Time Value Ref Range Status  09/05/2019 04:14 PM 8.6 (A) 0.0 - 7.0 % Final  06/23/2019 10:37 AM 12.7 (A) 0.0 - 7.0 % Final    CBG: Recent Labs  Lab 08/15/20 1521 08/15/20 1933 08/15/20 2359 08/16/20 0341 08/16/20 0733  GLUCAP 271* 225* 201*  212* 191*    Review of Systems:     Past Medical History:  She,  has a past medical history of Allergic rhinitis, Diabetes ([place]), Diabetes mellitus without complication ([place]), Hemorrhoids, Hyperlipidemia, Hypertension, Probable Alzheimer's dementia without behavioral  disturbance (09/15/2015), and Seizures ([place]).   Surgical History:   Past Surgical History:  Procedure Laterality Date  . carpel tunnel release    . ECTOPIC PREGNANCY SURGERY    . TEE WITHOUT CARDIOVERSION N/A 06/07/2018   Procedure: TRANSESOPHAGEAL ECHOCARDIOGRAM (TEE);  Surgeon: Lelon Perla, MD;  Location: [facility] ENDOSCOPY;  Service: Cardiovascular;  Laterality: N/A;     Social History:   reports that she has quit smoking. She has never used smokeless tobacco. She reports that she does not drink alcohol and does not use drugs.   Family History:  Her family history includes Colon cancer in an other family member; Heart disease in her father; Hypertension in her father and mother.   Allergies Allergies  Allergen Reactions  . Metformin And Related Rash  . Chlorthalidone Rash    Per [facility] records, pt may have had a rash rxn to chlorthalidone     Home Medications  Prior to Admission medications   Medication Sig Start Date End Date Taking? Authorizing Provider  acetaminophen (TYLENOL) 500 MG tablet Take 1,000 mg by mouth every 6 (six) hours as needed for headache (pain).   Yes [provider]  amLODipine (NORVASC) 5 MG tablet Take 1 tablet (5 mg total) by mouth at bedtime. 09/05/19  Yes Lockamy, Christia Reading, DO  aspirin EC 81 MG tablet Take 81 mg by mouth every morning.   Yes [provider]  atorvastatin (LIPITOR) 80 MG tablet TAKE 1 TABLET BY MOUTH DAILY AT 6 PM. Patient taking differently: Take 80 mg by mouth at bedtime. 04/29/20  Yes Leeanne Rio, MD  insulin glargine (LANTUS) 100 UNIT/ML Solostar Pen Inject 15 Units into the skin daily. Patient taking differently: Inject 15 Units into the skin at bedtime. 06/25/20  Yes Leeanne Rio, MD  Semaglutide,0.25 or 0.5MG/DOS, (OZEMPIC, 0.25 OR 0.5 MG/DOSE,) 2 MG/1.5ML SOPN Inject 0.25 mg into the skin once a week. (Thursdays) Patient taking differently: Inject 0.25 mg into the skin every Thursday. 07/20/19   Yes Hensel, Jamal Collin, MD  triamcinolone ointment (KENALOG) 0.5 % Apply 1 application topically 2 (two) times daily as needed. Patient taking differently: Apply 1 application topically 2 (two) times daily as needed (itching/rash). 06/13/20  Yes Mullis, Kiersten P, DO  Blood Glucose Monitoring Suppl (ONETOUCH VERIO) w/Device KIT 1 kit by Does not apply route daily. Check sugar twice daily 01/30/19   Leeanne Rio, MD  glucose blood [facility] VERIO) test strip Check sugar twice daily 06/25/17   Leeanne Rio, MD  Insulin Pen Needle (PEN NEEDLES) 32G X 4 MM MISC Inject 1 Dose into the skin as directed. 07/20/19   Zenia Resides, MD  Insulin Syringe-Needle U-100 (BD INSULIN SYRINGE U/F) 31G X 5/16" 0.3 ML MISC USE TO INJECT LANTUS DAILY 09/22/18   Leeanne Rio, MD  [facility] LANCETS 80K MISC Check sugar twice daily 06/25/17   Leeanne Rio, MD     Critical care time: 35 minutes     Domenick Bookbinder, MS4

## 2020-08-16 NOTE — Progress Notes (Signed)
Subjective: Patient successfully extubated today.  She is breathing easily.  She has not tried to swallow yet.  Objective: Vital signs in last 24 hours: Temp:  [98.5 F (36.9 C)-98.8 F (37.1 C)] 98.7 F (37.1 C) (04/22 1141) Pulse Rate:  [62-100] 75 (04/22 1200) Resp:  [13-24] 18 (04/22 1200) BP: (101-156)/(52-80) 131/52 (04/22 1200) SpO2:  [97 %-100 %] 100 % (04/22 1200) FiO2 (%):  [30 %] 30 % (04/22 0908) Wt Readings from Last 1 Encounters:  08/15/20 95 kg    Intake/Output from previous day: 04/21 0701 - 04/22 0700 In: 2018.5 [I.V.:329.4; NG/GT:480; IV Piggyback:1209.1] Out: 1827 [Urine:1827] Intake/Output this shift: Total I/O In: 137.7 [I.V.:26; NG/GT:100; IV Piggyback:11.7] Out: 150 [Urine:150]  General appearance: alert, cooperative and no distress Throat: no stridor, normal voice  Recent Labs    08/15/20 0559 08/16/20 0529  WBC 14.0* 13.8*  HGB 7.7* 7.6*  HCT 25.1* 24.4*  PLT 441* 426*    Recent Labs    08/15/20 0559 08/16/20 0529  NA 138 139  K 3.3* 4.0  CL 105 104  CO2 26 28  GLUCOSE 196* 206*  BUN 19 24*  CREATININE 0.44 0.53  CALCIUM 8.7* 9.0    Medications: I have reviewed the patient's current medications.  Assessment/Plan: Stridor  She looks quite good following extubation.  Advance diet as able.  Call with questions.   LOS: 4 days   Christia Reading 08/16/2020, 12:58 PM

## 2020-08-17 ENCOUNTER — Inpatient Hospital Stay (HOSPITAL_COMMUNITY): Payer: Medicare Other

## 2020-08-17 DIAGNOSIS — R7881 Bacteremia: Secondary | ICD-10-CM | POA: Diagnosis not present

## 2020-08-17 DIAGNOSIS — G309 Alzheimer's disease, unspecified: Secondary | ICD-10-CM | POA: Diagnosis not present

## 2020-08-17 DIAGNOSIS — J988 Other specified respiratory disorders: Secondary | ICD-10-CM | POA: Diagnosis not present

## 2020-08-17 DIAGNOSIS — D649 Anemia, unspecified: Secondary | ICD-10-CM | POA: Diagnosis not present

## 2020-08-17 DIAGNOSIS — R061 Stridor: Secondary | ICD-10-CM | POA: Diagnosis not present

## 2020-08-17 DIAGNOSIS — D689 Coagulation defect, unspecified: Secondary | ICD-10-CM | POA: Diagnosis not present

## 2020-08-17 DIAGNOSIS — E1149 Type 2 diabetes mellitus with other diabetic neurological complication: Secondary | ICD-10-CM | POA: Diagnosis not present

## 2020-08-17 LAB — ECHOCARDIOGRAM COMPLETE
Area-P 1/2: 3.03 cm2
Calc EF: 60.5 %
Height: 66 in
S' Lateral: 3.3 cm
Single Plane A2C EF: 63.2 %
Single Plane A4C EF: 59.2 %
Weight: 3354.52 oz

## 2020-08-17 LAB — IRON AND TIBC
Iron: 86 ug/dL (ref 28–170)
Saturation Ratios: 24 % (ref 10.4–31.8)
TIBC: 363 ug/dL (ref 250–450)
UIBC: 277 ug/dL

## 2020-08-17 LAB — CBC
HCT: 24.3 % — ABNORMAL LOW (ref 36.0–46.0)
Hemoglobin: 7 g/dL — ABNORMAL LOW (ref 12.0–15.0)
MCH: 27.6 pg (ref 26.0–34.0)
MCHC: 28.8 g/dL — ABNORMAL LOW (ref 30.0–36.0)
MCV: 95.7 fL (ref 80.0–100.0)
Platelets: 415 10*3/uL — ABNORMAL HIGH (ref 150–400)
RBC: 2.54 MIL/uL — ABNORMAL LOW (ref 3.87–5.11)
RDW: 17.5 % — ABNORMAL HIGH (ref 11.5–15.5)
WBC: 15.5 10*3/uL — ABNORMAL HIGH (ref 4.0–10.5)
nRBC: 0.3 % — ABNORMAL HIGH (ref 0.0–0.2)

## 2020-08-17 LAB — GLUCOSE, CAPILLARY
Glucose-Capillary: 108 mg/dL — ABNORMAL HIGH (ref 70–99)
Glucose-Capillary: 121 mg/dL — ABNORMAL HIGH (ref 70–99)
Glucose-Capillary: 126 mg/dL — ABNORMAL HIGH (ref 70–99)
Glucose-Capillary: 202 mg/dL — ABNORMAL HIGH (ref 70–99)
Glucose-Capillary: 183 mg/dL — ABNORMAL HIGH (ref 70–99)

## 2020-08-17 LAB — CULTURE, BLOOD (ROUTINE X 2): Culture: NO GROWTH

## 2020-08-17 LAB — BASIC METABOLIC PANEL
Anion gap: 10 (ref 5–15)
BUN: 18 mg/dL (ref 8–23)
CO2: 25 mmol/L (ref 22–32)
Calcium: 9.2 mg/dL (ref 8.9–10.3)
Chloride: 105 mmol/L (ref 98–111)
Creatinine, Ser: 0.46 mg/dL (ref 0.44–1.00)
GFR, Estimated: >60 mL/min (ref >60)
Glucose, Bld: 121 mg/dL — ABNORMAL HIGH (ref 70–99)
Potassium: 4 mmol/L (ref 3.5–5.1)
Sodium: 140 mmol/L (ref 135–145)

## 2020-08-17 LAB — VITAMIN B12: Vitamin B-12: 221 pg/mL (ref 180–914)

## 2020-08-17 LAB — RETICULOCYTES
Immature Retic Fract: 47.5 % — ABNORMAL HIGH (ref 2.3–15.9)
RBC.: 2.74 MIL/uL — ABNORMAL LOW (ref 3.87–5.11)
Retic Count, Absolute: 203.6 10*3/uL — ABNORMAL HIGH (ref 19.0–186.0)
Retic Ct Pct: 7.4 % — ABNORMAL HIGH (ref 0.4–3.1)

## 2020-08-17 LAB — HEMOGLOBIN AND HEMATOCRIT, BLOOD
HCT: 26.6 % — ABNORMAL LOW (ref 36.0–46.0)
Hemoglobin: 7.9 g/dL — ABNORMAL LOW (ref 12.0–15.0)

## 2020-08-17 LAB — FERRITIN: Ferritin: 100 ng/mL (ref 11–307)

## 2020-08-17 LAB — APTT: aPTT: 81 seconds — ABNORMAL HIGH (ref 24–36)

## 2020-08-17 LAB — FOLATE: Folate: 16.1 ng/mL (ref >5.9)

## 2020-08-17 MED ORDER — DOCUSATE SODIUM 100 MG PO CAPS: 100.0000 mg | ORAL_CAPSULE | Freq: Two times a day (BID) | ORAL | Status: DC

## 2020-08-17 MED ORDER — FOLIC ACID 1 MG PO TABS
1.0000 mg | ORAL_TABLET | Freq: Every day | ORAL | Status: DC
  Administered 2020-08-17 – 2020-08-20 (×3): 1 mg via ORAL
  Filled 2020-08-17 (×2): qty 1

## 2020-08-17 MED ORDER — BETHANECHOL CHLORIDE 10 MG PO TABS
10.0000 mg | ORAL_TABLET | Freq: Three times a day (TID) | ORAL | Status: DC
  Administered 2020-08-17 – 2020-08-18 (×5): 10 mg via ORAL
  Filled 2020-08-17 (×9): qty 1

## 2020-08-17 MED ORDER — ATORVASTATIN CALCIUM 80 MG PO TABS
80.0000 mg | ORAL_TABLET | Freq: Every day | ORAL | Status: DC
  Administered 2020-08-17 – 2020-08-19 (×3): 80 mg via ORAL
  Filled 2020-08-17 (×3): qty 1

## 2020-08-17 MED ORDER — SENNA 8.6 MG PO TABS
1.0000 | ORAL_TABLET | Freq: Every day | ORAL | Status: DC
  Administered 2020-08-17 – 2020-08-19 (×2): 8.6 mg via ORAL
  Filled 2020-08-17 (×2): qty 1

## 2020-08-17 MED ORDER — SODIUM CHLORIDE 0.9 % IV SOLN
INTRAVENOUS | Status: DC | PRN
  Administered 2020-08-17: 250 mL via INTRAVENOUS

## 2020-08-17 MED ORDER — ACETAMINOPHEN 500 MG PO TABS
1000.0000 mg | ORAL_TABLET | Freq: Four times a day (QID) | ORAL | Status: DC | PRN
  Administered 2020-08-18 – 2020-08-20 (×2): 1000 mg via ORAL
  Filled 2020-08-17 (×2): qty 2

## 2020-08-17 MED ORDER — IMMUNE GLOBULIN (HUMAN) 20 GM/200ML IV SOLN
1.0000 g/kg | INTRAVENOUS | Status: AC
  Administered 2020-08-17 – 2020-08-18 (×2): 95 g via INTRAVENOUS
  Filled 2020-08-17 (×2): qty 800

## 2020-08-17 MED ORDER — INSULIN ASPART 100 UNIT/ML ~~LOC~~ SOLN
0.0000 [IU] | Freq: Three times a day (TID) | SUBCUTANEOUS | Status: DC
  Administered 2020-08-17: 5 [IU] via SUBCUTANEOUS
  Administered 2020-08-17: 2 [IU] via SUBCUTANEOUS
  Administered 2020-08-18: 5 [IU] via SUBCUTANEOUS
  Administered 2020-08-18: 2 [IU] via SUBCUTANEOUS
  Administered 2020-08-18 – 2020-08-19 (×2): 5 [IU] via SUBCUTANEOUS
  Administered 2020-08-19: 3 [IU] via SUBCUTANEOUS
  Administered 2020-08-19: 11 [IU] via SUBCUTANEOUS
  Administered 2020-08-20: 8 [IU] via SUBCUTANEOUS
  Administered 2020-08-20: 5 [IU] via SUBCUTANEOUS
  Administered 2020-08-20: 11 [IU] via SUBCUTANEOUS

## 2020-08-17 MED ORDER — LACTATED RINGERS IV SOLN: INTRAVENOUS | Status: DC | PRN

## 2020-08-17 MED ORDER — AMLODIPINE BESYLATE 5 MG PO TABS
5.0000 mg | ORAL_TABLET | Freq: Every day | ORAL | Status: DC
  Administered 2020-08-17 – 2020-08-19 (×3): 5 mg via ORAL
  Filled 2020-08-17 (×3): qty 1

## 2020-08-17 MED ORDER — ADULT MULTIVITAMIN W/MINERALS CH
1.0000 | ORAL_TABLET | Freq: Every day | ORAL | Status: DC
  Administered 2020-08-17 – 2020-08-20 (×3): 1 via ORAL
  Filled 2020-08-17 (×2): qty 1

## 2020-08-17 MED ORDER — METHYLPREDNISOLONE SODIUM SUCC 40 MG IJ SOLR
40.0000 mg | Freq: Three times a day (TID) | INTRAMUSCULAR | Status: DC
  Administered 2020-08-17 – 2020-08-20 (×9): 40 mg via INTRAVENOUS
  Filled 2020-08-17 (×9): qty 1

## 2020-08-17 MED ORDER — POLYETHYLENE GLYCOL 3350 17 G PO PACK
17.0000 g | PACK | Freq: Every day | ORAL | Status: DC
  Administered 2020-08-17: 17 g via ORAL

## 2020-08-17 MED ORDER — INSULIN ASPART 100 UNIT/ML ~~LOC~~ SOLN
0.0000 [IU] | Freq: Every day | SUBCUTANEOUS | Status: DC
  Administered 2020-08-18: 2 [IU] via SUBCUTANEOUS
  Administered 2020-08-19: 3 [IU] via SUBCUTANEOUS

## 2020-08-17 NOTE — Progress Notes (Signed)
IP PROGRESS NOTE  Subjective:   Patient is extubated and conversant. Overall clinically stable.  No bleeding in stool, urine, or IV sites. No ecchymosis or petechiae noted. Hgb drop to 7.0, no respiratory distress or shortness of breath. Tolerating liquid diet.   Objective:  Vital signs in last 24 hours: Temp:  [97.7 F (36.5 C)-98.7 F (37.1 C)] 98.4 F (36.9 C) (04/23 0332) Pulse Rate:  [53-82] 66 (04/23 1200) Resp:  [12-26] 18 (04/23 1200) BP: (112-153)/(46-100) 127/49 (04/23 1200) SpO2:  [95 %-100 %] 98 % (04/23 1200) Weight:  [209 lb 10.5 oz (95.1 kg)] 209 lb 10.5 oz (95.1 kg) (04/23 0500) Weight change:  Last BM Date: 08/14/20  Intake/Output from previous day: 04/22 0701 - 04/23 0700 In: 1119.5 [I.V.:1007.8; NG/GT:100; IV Piggyback:11.7] Out: 1715 [Urine:1715]  General appearance:  No distress.  Remains intubated. Head: Atraumatic without abnormalities Oropharynx: ET tube in place. Eyes: No scleral icterus. Lymph nodes: No lymphadenopathy noted in the cervical, supraclavicular, or axillary nodes Heart:regular rate and rhythm, without any murmurs or gallops.   Lung: Clear to auscultation without any rhonchi, wheezes or dullness to percussion. Abdomin: Soft, nontender without any shifting dullness or ascites. Musculoskeletal: No clubbing or cyanosis. Neurological: No motor or sensory deficits. Skin: Unchanged with very little ecchymosis noted.   Lab Results: Recent Labs    08/16/20 0529 08/17/20 0532  WBC 13.8* 15.5*  HGB 7.6* 7.0*  HCT 24.4* 24.3*  PLT 426* 415*    BMET Recent Labs    08/16/20 0529 08/17/20 0532  NA 139 140  K 4.0 4.0  CL 104 105  CO2 28 25  GLUCOSE 206* 121*  BUN 24* 18  CREATININE 0.53 0.46  CALCIUM 9.0 9.2    Studies/Results: No results found.  Medications: I have reviewed the patient's current medications.  Assessment/Plan:  75 year old woman with:  1.  Anemia noted on admission with a hemoglobin of 7.0 in the setting  of ecchymosis as well as pharyngeal hematoma.  Work-up for autoimmune hemolytic anemia is negative at this time.  Her Coombs testing is negative with normal LDH and normal bilirubin.  Her elevated indirect bilirubin has improved.  She could have a transient hemolysis related to her hematoma but no active hemolysis noted at this time.  For the time being I recommend continued supportive transfusion (transfuse for Hgb <7.0)  and check iron stores given her hypochromia on peripheral smear.  2.  Elevated PTT with normal PT: Work-up is ongoing for possible inhibitor.  Factor VIII inhibitors are the most common in this particular age group and can present with severe bleeding.  Her PTT is improving which could be spontaneous or may be related to her dexamethasone dosing.  --PTT mixing study does not correct, Factor VIII level is <1%. 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 She is currently on liquid diet, can provide IV equivalent if necessary.  --if patient were to start to have critical bleeding/drop in Hgb/unstable vitals recommend administering rFVIIa 90 mcg/kg q 2 hours. DO NOT use FVIII supplementation as we do not know the titer of the inhibitor. If rFVIIa is unavailable can use aPCC 50 IU/kg q 8H --in addition to steroid will start high dose IVIG 1g/kg daily x 2 days. Discussed the risks and benefits with the patient today --  Hematology will continue to monitor   3.  Stridor: She has been extubated and is breathing comfortably . Plan to transfer to the floor today.   We will continue to follow her over the weekend and add any additional recommendations.  30  minutes were dedicated to this visit.  50% of time was spent face-to-face.  The time was spent on reviewing  her disease status, discussing treatment options and reviewing laboratory results.    LOS: 5 days   Ulysees Barns, MD Department of Hematology/Oncology [facility] Phone: [phone] Pager: [phone] Email: [email or web address]

## 2020-08-17 NOTE — Progress Notes (Signed)
Handoff given to Meridee Score., receiving nurse on 2W on how to maintain and complete IVIG infusion per IV team nurse instructions which were hand written and hung on bag with medication.

## 2020-08-17 NOTE — Progress Notes (Signed)
NAME:  Paula Reed, MRN:  [id number], DOB:  1946/01/24, LOS: 5 ADMISSION DATE:  08/12/2020, CONSULTATION DATE:  08/12/20 REFERRING MD:  Criss Alvine, CHIEF COMPLAINT:  stridor  History of Present Illness:  75 yo female former smoker presented with stridor and dyspnea with dysphagia.  Found to have supraglottic edema and discoloration concerning for a hematoma.  She required intubation and ENT was consulted.  CT neck demonstrated mild/possible laryngeal edema w/out any evidence of LAD. CT chest demonstrated small mediastinal nodes and mild bilateral lower lobe atelectasis with trace bilateral pleural effusions.   Pertinent  Medical History  DM type 2, HTN, HLD, Seizures, Dementia  Significant Hospital Events: Including procedures, antibiotic start and stop dates in addition to other pertinent events   . 4/18 intubated for airway protection  . 4/22 planned extubation; hematology consulted . 4/23 transfer to medical telemetry  Interim History / Subjective:  Throat feels raspy.  Objective   Blood pressure (!) 117/50, pulse (!) 55, temperature 98.4 F (36.9 C), temperature source Oral, resp. rate 14, height 5\' 6"  (1.676 m), weight 95.1 kg, SpO2 100 %.        Intake/Output Summary (Last 24 hours) at 08/17/2020 0940 Last data filed at 08/17/2020 0800 Gross per 24 hour  Intake 1148.23 ml  Output 1615 ml  Net -466.77 ml   Filed Weights   08/14/20 0204 08/15/20 0241 08/17/20 0500  Weight: 94.5 kg 95 kg 95.1 kg    Examination:  General - alert Eyes - pupils reactive ENT - no sinus tenderness, no stridor Cardiac - regular rate/rhythm, no murmur Chest - equal breath sounds b/l, no wheezing or rales Abdomen - soft, non tender, + bowel sounds Extremities - no cyanosis, clubbing, or edema Skin - no rashes Neuro - normal strength, moves extremities, follows commands Psych - normal mood and behavior  Labs/imaging that I havepersonally reviewed  (right click and "Reselect all  SmartList Selections" daily)   CMP Latest Ref Rng & Units 08/17/2020 08/16/2020 08/15/2020  Glucose 70 - 99 mg/dL 08/17/2020) 810(F) 751(W)  BUN 8 - 23 mg/dL 18 258(N) 19  Creatinine 0.44 - 1.00 mg/dL 27(P 8.24 2.35  Sodium 135 - 145 mmol/L 140 139 138  Potassium 3.5 - 5.1 mmol/L 4.0 4.0 3.3(L)  Chloride 98 - 111 mmol/L 105 104 105  CO2 22 - 32 mmol/L 25 28 26   Calcium 8.9 - 10.3 mg/dL 9.2 9.0 3.61)  Total Protein 6.5 - 8.1 g/dL - 6.5 6.5  Total Bilirubin 0.3 - 1.2 mg/dL - 0.9 0.9  Alkaline Phos 38 - 126 U/L - 55 55  AST 15 - 41 U/L - 16 15  ALT 0 - 44 U/L - 10 11    CBC Latest Ref Rng & Units 08/17/2020 08/16/2020 08/15/2020  WBC 4.0 - 10.5 K/uL 15.5(H) 13.8(H) 14.0(H)  Hemoglobin 12.0 - 15.0 g/dL 7.0(L) 7.6(L) 7.7(L)  Hematocrit 36.0 - 46.0 % 24.3(L) 24.4(L) 25.1(L)  Platelets 150 - 400 K/uL 415(H) 426(H) 441(H)    ABG    Component Value Date/Time   PHART 7.517 (H) 08/13/2020 0336   PCO2ART 31.1 (L) 08/13/2020 0336   PO2ART 112 (H) 08/13/2020 0336   HCO3 25.3 08/13/2020 0336   TCO2 26 08/13/2020 0336   ACIDBASEDEF 1.5 06/07/2017 0640   O2SAT 99.0 08/13/2020 0336    CBG (last 3)  Recent Labs    08/16/20 2331 08/17/20 0331 08/17/20 0747  GLUCAP 141* 108* 121*    Resolved Hospital Problem list  Assessment & Plan:   Stridor from laryngeal edema and possible hematoma. - extubated 4/22 - continue decadron for now; if stable, then can likely d/c on 4/24 or 4/25  Streptococcus mitis bacteremia from 08/12/20. - day 4 of ABx - check Echo  Anemia, elevated PTT with normal PT. - work up negative for hemolytic anemia; likely had transient hemolysis from hematoma - f/u PTT mixing studies as well as factor VIII inhibitor titers - check iron studies - hematology consulted  DM type 2 poorly controlled with steroid induced hyperglycemia. - SSI with lantus  Hx of HTN, HLD. Sinus bradycardia. - norvasc, lipitor - monitor heart rhythm - hold outpt ASA  Deconditioning. -  PT/OT  Best practice  Diet:  Oral DVT prophylaxis: SCD and Contraindicated GI prophylaxis: N/A and PPI Mobility:  OOB  PT consulted: Yes Code Status:  full code Disposition: medical telemetry;  Followed by FPTS as outpt.  Will ask FPTS to assume care from 4/24 and PCCM sign off  Signature:  Coralyn Helling, MD [facility] Pulmonary/Critical Care Pager - [phone] 08/17/2020, 9:51 AM

## 2020-08-17 NOTE — Progress Notes (Signed)
Spoke with hematology.  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  She needs to remain on systemic steroids.  Will change to solumedrol 40 mg q8h.  Coralyn Helling, MD [facility] Pulmonary/Critical Care Pager - [phone] 08/17/2020, 11:17 AM

## 2020-08-17 NOTE — Progress Notes (Signed)
  Echocardiogram 2D Echocardiogram with strain has been performed.  Leta Jungling M 08/17/2020, 1:35 PM

## 2020-08-17 NOTE — Evaluation (Signed)
Clinical/Bedside Swallow Evaluation Patient Details  Name: Paula Reed MRN: [id number] Date of Birth: 1945-09-08  Today's Date: 08/17/2020 Time: SLP Start Time (ACUTE ONLY): 0840 SLP Stop Time (ACUTE ONLY): 0850 SLP Time Calculation (min) (ACUTE ONLY): 10 min  Past Medical History:  Past Medical History:  Diagnosis Date  . Allergic rhinitis   . Diabetes (HCC)   . Diabetes mellitus without complication (HCC)   . Hemorrhoids   . Hyperlipidemia   . Hypertension   . Probable Alzheimer's dementia without behavioral disturbance 09/15/2015   May 2017 [facility] [address] score 8/30   . Seizures (HCC)    Past Surgical History:  Past Surgical History:  Procedure Laterality Date  . carpel tunnel release    . ECTOPIC PREGNANCY SURGERY    . TEE WITHOUT CARDIOVERSION N/A 06/07/2018   Procedure: TRANSESOPHAGEAL ECHOCARDIOGRAM (TEE);  Surgeon: Lewayne Bunting, MD;  Location: [facility] ENDOSCOPY;  Service: Cardiovascular;  Laterality: N/A;   HPI:  75 year old woman with history of DMII, HTN, HLD, seizures and mild Alzheimer's dementia who is admitted to the ICU for stridor. She was intubated in the ER on 4/18 and noted to have supraglottis swelling and discoloration consistent with hematoma of her airway.  CT neck demonstrated mild/possible laryngeal edema w/out any evidence of LAD. CT chest demonstrated small mediastinal nodes and mild bilateral lower lobe atelectasis with trace bilateral pleural effusions. ENT scope while intubated shows "submucosal hemorrhage extending from the nasopharynx down to the posterior glottis extending onto the arytenoid towers bilaterally.  It did not appear infected nor was there a discrete mass on exam; rather, this looked liked bruised mucosa suggestive of a spontaneous submucosal hemorrhage of unknown etiology." intubated 4/18-4/22   Assessment / Plan / Recommendation Clinical Impression  Pt demonstrates minimal signs of dysphagia and denies any difficulty though she is a poor  historian with no awareness of deficits. Pt has a clear vocal quality and strong volitional cough. She is alert and able to self feed. No immediate signs of aspiration observed with small sips of water or bites of puree. No grimacing with swallowing noted. After several oz of puree given pt had a small cough and later, after consecutive sips of water a throat clear. Overall pt appears to be protecting her airway though pharyngeal/laryngeal sensitivity is expected given dx. Recommend starting full liquids with advancement to solids as appropriate. May need to discuss baseline diet with husband as pt is edentulous and denies presence of dentures or modified diet, but is also only oriented to self. SLP Visit Diagnosis: Dysphagia, unspecified (R13.10)    Aspiration Risk  Mild aspiration risk    Diet Recommendation Thin liquid   Liquid Administration via: Straw;Cup Medication Administration: Crushed with puree Compensations: Slow rate;Small sips/bites;Minimize environmental distractions Postural Changes: Seated upright at 90 degrees    Other  Recommendations Oral Care Recommendations: Oral care BID   Follow up Recommendations 24 hour supervision/assistance      Frequency and Duration min 2x/week  2 weeks       Prognosis Prognosis for Safe Diet Advancement: Good      Swallow Study   General HPI: 75 year old woman with history of DMII, HTN, HLD, seizures and mild Alzheimer's dementia who is admitted to the ICU for stridor. She was intubated in the ER on 4/18 and noted to have supraglottis swelling and discoloration consistent with hematoma of her airway.  CT neck demonstrated mild/possible laryngeal edema w/out any evidence of LAD. CT chest demonstrated small mediastinal nodes  and mild bilateral lower lobe atelectasis with trace bilateral pleural effusions. ENT scope while intubated shows "submucosal hemorrhage extending from the nasopharynx down to the posterior glottis extending onto the  arytenoid towers bilaterally.  It did not appear infected nor was there a discrete mass on exam; rather, this looked liked bruised mucosa suggestive of a spontaneous submucosal hemorrhage of unknown etiology." intubated 4/18-4/22 Type of Study: Bedside Swallow Evaluation Previous Swallow Assessment: none Diet Prior to this Study: NPO Temperature Spikes Noted: No Respiratory Status: Nasal cannula History of Recent Intubation: Yes Length of Intubations (days): 3 days Date extubated: 08/16/20 Behavior/Cognition: Alert;Cooperative;Confused Oral Cavity Assessment: Other (comment) (bruising on left tongue and posterior pharynx) Oral Care Completed by SLP: No Oral Cavity - Dentition: Edentulous Vision: Functional for self-feeding Self-Feeding Abilities: Able to feed self;Needs assist Patient Positioning: Upright in bed Baseline Vocal Quality: Normal Volitional Cough: Strong Volitional Swallow: Able to elicit    Oral/Motor/Sensory Function Overall Oral Motor/Sensory Function: Within functional limits   Ice Chips     Thin Liquid Thin Liquid: Impaired Presentation: Cup;Self Fed Pharyngeal  Phase Impairments: Throat Clearing - Immediate    Nectar Thick Nectar Thick Liquid: Not tested   Honey Thick Honey Thick Liquid: Not tested   Puree Puree: Impaired Presentation: Spoon Pharyngeal Phase Impairments: Cough - Delayed   Solid     Solid: Not tested     Harlon Ditty, MA CCC-SLP  Acute Rehabilitation Services Pager [phone] Office [phone]  Claudine Mouton 08/17/2020,9:01 AM

## 2020-08-18 DIAGNOSIS — I10 Essential (primary) hypertension: Secondary | ICD-10-CM

## 2020-08-18 DIAGNOSIS — D689 Coagulation defect, unspecified: Secondary | ICD-10-CM | POA: Diagnosis not present

## 2020-08-18 DIAGNOSIS — E1149 Type 2 diabetes mellitus with other diabetic neurological complication: Secondary | ICD-10-CM

## 2020-08-18 DIAGNOSIS — D649 Anemia, unspecified: Secondary | ICD-10-CM | POA: Diagnosis not present

## 2020-08-18 DIAGNOSIS — B9562 Methicillin resistant Staphylococcus aureus infection as the cause of diseases classified elsewhere: Secondary | ICD-10-CM

## 2020-08-18 DIAGNOSIS — Z794 Long term (current) use of insulin: Secondary | ICD-10-CM

## 2020-08-18 DIAGNOSIS — G309 Alzheimer's disease, unspecified: Secondary | ICD-10-CM

## 2020-08-18 DIAGNOSIS — F0280 Dementia in other diseases classified elsewhere without behavioral disturbance: Secondary | ICD-10-CM

## 2020-08-18 DIAGNOSIS — R7881 Bacteremia: Secondary | ICD-10-CM

## 2020-08-18 LAB — APTT: aPTT: 67 seconds — ABNORMAL HIGH (ref 24–36)

## 2020-08-18 LAB — COMPREHENSIVE METABOLIC PANEL
ALT: 15 U/L (ref 0–44)
AST: 20 U/L (ref 15–41)
Albumin: 2.6 g/dL — ABNORMAL LOW (ref 3.5–5.0)
Alkaline Phosphatase: 52 U/L (ref 38–126)
Anion gap: 9 (ref 5–15)
BUN: 16 mg/dL (ref 8–23)
CO2: 24 mmol/L (ref 22–32)
Calcium: 9 mg/dL (ref 8.9–10.3)
Chloride: 100 mmol/L (ref 98–111)
Creatinine, Ser: 0.58 mg/dL (ref 0.44–1.00)
GFR, Estimated: >60 mL/min (ref >60)
Glucose, Bld: 210 mg/dL — ABNORMAL HIGH (ref 70–99)
Potassium: 3.7 mmol/L (ref 3.5–5.1)
Sodium: 133 mmol/L — ABNORMAL LOW (ref 135–145)
Total Bilirubin: 1.7 mg/dL — ABNORMAL HIGH (ref 0.3–1.2)
Total Protein: 8.3 g/dL — ABNORMAL HIGH (ref 6.5–8.1)

## 2020-08-18 LAB — CBC WITH DIFFERENTIAL/PLATELET
Abs Immature Granulocytes: 0.12 10*3/uL — ABNORMAL HIGH (ref 0.00–0.07)
Basophils Absolute: 0 10*3/uL (ref 0.0–0.1)
Basophils Relative: 0 %
Eosinophils Absolute: 0 10*3/uL (ref 0.0–0.5)
Eosinophils Relative: 0 %
HCT: 23.2 % — ABNORMAL LOW (ref 36.0–46.0)
Hemoglobin: 7.1 g/dL — ABNORMAL LOW (ref 12.0–15.0)
Immature Granulocytes: 1 %
Lymphocytes Relative: 9 %
Lymphs Abs: 1 10*3/uL (ref 0.7–4.0)
MCH: 28.7 pg (ref 26.0–34.0)
MCHC: 30.6 g/dL (ref 30.0–36.0)
MCV: 93.9 fL (ref 80.0–100.0)
Monocytes Absolute: 0.6 10*3/uL (ref 0.1–1.0)
Monocytes Relative: 5 %
Neutro Abs: 9.1 10*3/uL — ABNORMAL HIGH (ref 1.7–7.7)
Neutrophils Relative %: 85 %
Platelets: 447 10*3/uL — ABNORMAL HIGH (ref 150–400)
RBC: 2.47 MIL/uL — ABNORMAL LOW (ref 3.87–5.11)
RDW: 18 % — ABNORMAL HIGH (ref 11.5–15.5)
WBC: 10.9 10*3/uL — ABNORMAL HIGH (ref 4.0–10.5)
nRBC: 0.4 % — ABNORMAL HIGH (ref 0.0–0.2)

## 2020-08-18 LAB — GLUCOSE, CAPILLARY
Glucose-Capillary: 219 mg/dL — ABNORMAL HIGH (ref 70–99)
Glucose-Capillary: 234 mg/dL — ABNORMAL HIGH (ref 70–99)
Glucose-Capillary: 286 mg/dL — ABNORMAL HIGH (ref 70–99)
Glucose-Capillary: 249 mg/dL — ABNORMAL HIGH (ref 70–99)

## 2020-08-18 MED ORDER — QUETIAPINE FUMARATE 25 MG PO TABS
25.0000 mg | ORAL_TABLET | Freq: Every day | ORAL | Status: DC
  Administered 2020-08-18 – 2020-08-19 (×2): 25 mg via ORAL
  Filled 2020-08-18 (×2): qty 1

## 2020-08-18 NOTE — Progress Notes (Signed)
IP PROGRESS NOTE  Subjective:   --Patient is extubated and conversant.  --Overall clinically stable.  No tachycardia or hypotension. Satting well on room air.  --No bleeding in stool, urine, or IV sites. No ecchymosis or petechiae noted.  --Hgb at 7.1 today, stable from prior  Objective:  Vital signs in last 24 hours: Temp:  [97.7 F (36.5 C)-98.9 F (37.2 C)] 97.8 F (36.6 C) (04/24 1745) Pulse Rate:  [71-95] 71 (04/24 1745) Resp:  [15-20] 20 (04/24 1745) BP: (118-144)/(53-79) 127/79 (04/24 1745) SpO2:  [97 %-100 %] 100 % (04/24 1745) Weight:  [199 lb 8.3 oz (90.5 kg)] 199 lb 8.3 oz (90.5 kg) (04/24 0642) Weight change: -10 lb 2.3 oz (-4.6 kg) Last BM Date: 08/14/20  Intake/Output from previous day: 04/23 0701 - 04/24 0700 In: 1879.5 [P.O.:360; I.V.:1369.6; IV Piggyback:99.9] Out: 1550 [Urine:1550]  General appearance:  No distress.  Remains intubated. Head: Atraumatic without abnormalities Oropharynx: ET tube in place. Eyes: No scleral icterus. Lymph nodes: No lymphadenopathy noted in the cervical, supraclavicular, or axillary nodes Heart:regular rate and rhythm, without any murmurs or gallops.   Lung: Clear to auscultation without any rhonchi, wheezes or dullness to percussion. Abdomin: Soft, nontender without any shifting dullness or ascites. Musculoskeletal: No clubbing or cyanosis. Neurological: No motor or sensory deficits. Skin: Unchanged with very little ecchymosis noted.   Lab Results: Recent Labs    08/17/20 0532 08/17/20 1439 08/18/20 0502  WBC 15.5*  --  10.9*  HGB 7.0* 7.9* 7.1*  HCT 24.3* 26.6* 23.2*  PLT 415*  --  447*    BMET Recent Labs    08/17/20 0532 08/18/20 0502  NA 140 133*  K 4.0 3.7  CL 105 100  CO2 25 24  GLUCOSE 121* 210*  BUN 18 16  CREATININE 0.46 0.58  CALCIUM 9.2 9.0    Studies/Results: ECHOCARDIOGRAM COMPLETE  Result Date: 08/17/2020    ECHOCARDIOGRAM REPORT   Patient Name:   Paula Reed Date of Exam:  08/17/2020 Medical Rec #:  [id number]        Height:       66.0 in Accession #:    [id number]       Weight:       209.7 lb Date of Birth:  01/30/1946       BSA:          2.040 m Patient Age:    75 years         BP:           110/50 mmHg Patient Gender: F                HR:           67 bpm. Exam Location:  Inpatient Procedure: 2D Echo, Cardiac Doppler, Color Doppler and Strain Analysis Indications:    Bacteremia R78.81  History:        Patient has prior history of Echocardiogram examinations, most                 recent 06/07/2018. Risk Factors:Former Smoker. Possible laryngeal                 edema, trace bilateral pleural effusions, Streptococcus mitis                 bacteremia, anemia, sinus bradycardia, DM type 2, HTN, HLD,                 Seizures, Dementia.  Sonographer:    Leta Jungling  RDCS Referring Phys: [id number] VINEET SOOD IMPRESSIONS  1. No obvious vegetations but technically difficult; if clinical suspicion high, suggest TEE to better assess.  2. Left ventricular ejection fraction, by estimation, is 60 to 65%. The left ventricle has normal function. The left ventricle has no regional wall motion abnormalities. Left ventricular diastolic parameters are consistent with Grade I diastolic dysfunction (impaired relaxation). The average left ventricular global longitudinal strain is -18.6 %. The global longitudinal strain is normal.  3. Right ventricular systolic function is normal. The right ventricular size is normal. There is normal pulmonary artery systolic pressure.  4. The mitral valve is normal in structure. No evidence of mitral valve regurgitation. No evidence of mitral stenosis.  5. The aortic valve is tricuspid. Aortic valve regurgitation is not visualized. Mild aortic valve sclerosis is present, with no evidence of aortic valve stenosis.  6. The inferior vena cava is normal in size with greater than 50% respiratory variability, suggesting right atrial pressure of 3 mmHg. FINDINGS  Left Ventricle:  Left ventricular ejection fraction, by estimation, is 60 to 65%. The left ventricle has normal function. The left ventricle has no regional wall motion abnormalities. The average left ventricular global longitudinal strain is -18.6 %. The global longitudinal strain is normal. The left ventricular internal cavity size was normal in size. There is no left ventricular hypertrophy. Left ventricular diastolic parameters are consistent with Grade I diastolic dysfunction (impaired relaxation). Right Ventricle: The right ventricular size is normal.Right ventricular systolic function is normal. There is normal pulmonary artery systolic pressure. The tricuspid regurgitant velocity is 2.28 m/s, and with an assumed right atrial pressure of 3 mmHg, the estimated right ventricular systolic pressure is 23.8 mmHg. Left Atrium: Left atrial size was normal in size. Right Atrium: Right atrial size was normal in size. Pericardium: There is no evidence of pericardial effusion. Mitral Valve: The mitral valve is normal in structure. No evidence of mitral valve regurgitation. No evidence of mitral valve stenosis. Tricuspid Valve: The tricuspid valve is normal in structure. Tricuspid valve regurgitation is mild . No evidence of tricuspid stenosis. Aortic Valve: The aortic valve is tricuspid. Aortic valve regurgitation is not visualized. Mild aortic valve sclerosis is present, with no evidence of aortic valve stenosis. Pulmonic Valve: The pulmonic valve was not well visualized. Pulmonic valve regurgitation is not visualized. No evidence of pulmonic stenosis. Aorta: The aortic root is normal in size and structure. Venous: The inferior vena cava is normal in size with greater than 50% respiratory variability, suggesting right atrial pressure of 3 mmHg. IAS/Shunts: No atrial level shunt detected by color flow Doppler. Additional Comments: No obvious vegetations but technically difficult; if clinical suspicion high, suggest TEE to better assess.   LEFT VENTRICLE PLAX 2D LVIDd:         4.60 cm     Diastology LVIDs:         3.30 cm     LV e' medial:    5.66 cm/s LV PW:         0.80 cm     LV E/e' medial:  11.2 LV IVS:        1.00 cm     LV e' lateral:   7.18 cm/s LVOT diam:     1.90 cm     LV E/e' lateral: 8.8 LV SV:         56 LV SV Index:   28          2D Longitudinal Strain LVOT Area:  2.84 cm    2D Strain GLS Avg:     -18.6 %  LV Volumes (MOD) LV vol d, MOD A2C: 63.6 ml LV vol d, MOD A4C: 72.6 ml LV vol s, MOD A2C: 23.4 ml LV vol s, MOD A4C: 29.6 ml LV SV MOD A2C:     40.2 ml LV SV MOD A4C:     72.6 ml LV SV MOD BP:      41.3 ml LEFT ATRIUM           Index LA diam:      3.00 cm 1.47 cm/m LA Vol (A2C): 17.3 ml 8.48 ml/m LA Vol (A4C): 54.3 ml 26.61 ml/m  AORTIC VALVE LVOT Vmax:   90.10 cm/s LVOT Vmean:  63.300 cm/s LVOT VTI:    0.198 m  AORTA Ao Root diam: 3.20 cm MITRAL VALVE               TRICUSPID VALVE MV Area (PHT): 3.03 cm    TR Peak grad:   20.8 mmHg MV Decel Time: 250 msec    TR Vmax:        228.00 cm/s MV E velocity: 63.40 cm/s MV A velocity: 60.40 cm/s  SHUNTS MV E/A ratio:  1.05        Systemic VTI:  0.20 m                            Systemic Diam: 1.90 cm Olga Millers MD Electronically signed by Olga Millers MD Signature Date/Time: 08/17/2020/3:05:12 PM    Final     Medications: I have reviewed the patient's current medications.  Assessment/Plan:  75 year old woman with:  1.  Anemia noted on admission with a hemoglobin of 7.0 in the setting of ecchymosis as well as pharyngeal hematoma.  Work-up for autoimmune hemolytic anemia is negative at this time.  Her Coombs testing is negative with normal LDH and normal bilirubin.  Her elevated indirect bilirubin has improved.  She could have a transient hemolysis related to her hematoma but no active hemolysis noted at this time.  For the time being I recommend continued supportive transfusion (transfuse for Hgb <7.0)  and check iron stores given her hypochromia on peripheral  smear.  2.  Elevated PTT with normal PT: Work-up is ongoing for possible inhibitor.  Factor VIII inhibitors are the most common in this particular age group and can present with severe bleeding.  Her PTT is improving which could be spontaneous or may be related to her dexamethasone dosing.  --PTT mixing study does not correct, Factor VIII level is <1%. 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 She is currently on liquid diet, can provide IV equivalent if necessary.  --if patient were to start to have critical bleeding/drop in Hgb/unstable vitals recommend administering rFVIIa 90 mcg/kg q 2 hours. DO NOT use FVIII supplementation as we do not know the titer of the inhibitor. If rFVIIa is unavailable can use aPCC 50 IU/kg q 8H --in addition to steroid will start high dose IVIG 1g/kg daily x 2 days. Discussed the risks and benefits with the patient today --Hematology will continue to monitor   3.  Stridor: She has been extubated and is breathing comfortably . Plan to transfer to the floor today.  We will continue to follow her over the weekend and add any additional recommendations.  30  minutes were dedicated to this visit.  50% of time was spent face-to-face.  The time was spent on reviewing her disease status, discussing treatment options and reviewing laboratory results.    LOS: 6 days   Ulysees BarnsJohn T. Malie Kashani, MD Department of Hematology/Oncology [facility] Phone: [phone] Pager: [phone] Email: [email or web address]

## 2020-08-18 NOTE — Progress Notes (Addendum)
Family Medicine Teaching Service Daily Progress Note Intern Pager: [phone]  Patient name: Paula Reed Medical record number: [id number] Date of birth: Sep 22, 1945 Age: 75 y.o. Gender: female  Primary Care Provider: Latrelle Dodrill, MD Consultants: CCM, hematology, ENT Code Status: Full  Pt Overview and Major Events to Date:  4/18-admitted to ICU 4/22-extubated 4/24-transferred FPTS  Assessment and Plan: 75 year old woman presenting with increasing stridor preceded by dysphagia.  Past medical history significant for seizures, Alzheimer's dementia, hypertension, hyperlipidemia, T2DM.  Acute stridor with acute hypoxic respiratory failure-improved Currently on room air with no complaints of breathing difficulty, no stridor on exam.  Was in the ICU and intubated.  Now extubated and transferred out of the ICU to the care of family practice. ENT was initially involved and the patient was intubated while in the ICU.  CT soft tissue of neck showed circumferential mucosal edema in the pharynx which suggested fluid that could be blood or secretions.  There was concern for submucosal hemorrhage at the time.  At this time appears to be improving well. -Continue to monitor  Anemia with elevated PTT and normal PT: Current Hgb of 7.1.  Transfusion threshold of 7.  Baseline hemoglobin of 12-13.  While in the ICU hematology was consulted out of concern for autoimmune hemolytic anemia.  Patient was treated with 4 mg Decadron every 6 hours.  She did receive 1 unit packed RBCs on 4/19.  Coombs testing was negative.  Patient did have elevated PTT and a normal PTT with a differential diagnosis including an inhibitor versus lupus anticoagulant per the oncology note.  Per oncology factor VIII inhibitor is most common finding in the settings.  They recommended waiting for results of PTT factor inhibitor study, continue steroids daily, if patient were to have critical bleeding or drop in hemoglobin recommend  administering rFVIIa 3mcg/kg q2hr. Oncology recommended not using a factor VIII supplementation in that setting. -Hematology following, appreciate recommendations -Continue immunoglobulin Privigen for the 2 ordered doses -AM CBC -Transfuse for threshold of 7  Strep mitis bacteremia: Patient grew strep mitis.  Currently on ceftriaxone 2 g every 24 hours which started on 4/20.  Currently starting day 5 of antibiotics.  Echo showed no obvious vegetations but stated technically difficult exam and suggested TEE for better assessment.  Patient had ejection fraction of 60-65% with grade 1 diastolic dysfunction. -Consider TEE for better evaluation for vegetations -Continue ceftriaxone for 7-14 total days  Type 2 diabetes: AM CBG shows 219.  Currently on 10 units of Lantus daily with sliding scale.  Patient is also taken Solu-Medrol 40 mg every 8 hours IV. -Continue sliding scale with bedtime correction -Continue 10 units Lantus -Monitor closely while on steroids  HTN/HLD: Blood pressure overnight 136-144 over 50s.  Current meds include amlodipine 5 mg.  Patient takes Lipitor 80 mg for hyperlipidemia. -Continue current medications  FEN/GI: Full liquid PPx: SCDs  Status is: Inpatient  Remains inpatient appropriate because:Inpatient level of care appropriate due to severity of illness   Dispo: The patient is from: Home              Anticipated d/c is to: Home              Patient currently is not medically stable to d/c.   Difficult to place patient No   Subjective:  Patient with no complaints today.  She is able to state her name and current location but does not know the year, the current president, or what brought  her into the hospital.  When asking what brought her to the hospital she states that she overdid it with her back and had some back pain.  Objective: Temp:  [97.4 F (36.3 C)-98.8 F (37.1 C)] 98.8 F (37.1 C) (04/24 0513) Pulse Rate:  [55-95] 80 (04/24 0513) Resp:   [14-26] 15 (04/24 0513) BP: (110-144)/(49-118) 144/56 (04/24 0513) SpO2:  [95 %-100 %] 97 % (04/24 0513) Weight:  [90.5 kg] 90.5 kg (04/24 4627)   Physical Exam: General: Alert and oriented in no apparent distress HEENT: No stridor noted with auscultation of patient's neck, no pain with palpation of the patient's throat, no pharyngeal erythema Heart: Regular rate and rhythm with no murmurs appreciated Lungs: CTA bilaterally, no wheezing Abdomen: Bowel sounds present, no abdominal pain Skin: Warm and dry Extremities: No lower extremity edema  Laboratory: Recent Labs  Lab 08/16/20 0529 08/17/20 0532 08/17/20 1439 08/18/20 0502  WBC 13.8* 15.5*  --  10.9*  HGB 7.6* 7.0* 7.9* 7.1*  HCT 24.4* 24.3* 26.6* 23.2*  PLT 426* 415*  --  447*   Recent Labs  Lab 08/15/20 0559 08/16/20 0529 08/17/20 0532 08/18/20 0502  NA 138 139 140 133*  K 3.3* 4.0 4.0 3.7  CL 105 104 105 100  CO2 26 28 25 24   BUN 19 24* 18 16  CREATININE 0.44 0.53 0.46 0.58  CALCIUM 8.7* 9.0 9.2 9.0  PROT 6.5 6.5  --  8.3*  BILITOT 0.9 0.9  --  1.7*  ALKPHOS 55 55  --  52  ALT 11 10  --  15  AST 15 16  --  20  GLUCOSE 196* 206* 121* 210*     , DO 08/18/2020, 6:49 AM PGY-2, [facility] FPTS Intern pager: [phone], text pages welcome

## 2020-08-18 NOTE — Progress Notes (Addendum)
Received [facility] mobile page to my personal pager number. While the text was a bit garbled (likely text error), I believe this page was about the IV team calculating the IVIG rate incorrectly and RN being unable to reach the IV team.   Number listed in the page was incorrect, rang to the charge respiratory therapist. Called floor and attempted to connect with nurse, however was told she was getting report on a patient.   I will try to answer the question with the limited information I have. It looks like hematology ordered the high dose IVIG. Per their note today, they are doing "high dose IVIG 1g/kg daily x 2 days". Ms. Renfrow is 90.5 kg today. This would be 90.5g IVIG daily. I am personally unsure of infusion duration.   Specific questions should be directed to Dr. Leonides Schanz with heme onc. His information from his note is below: Ulysees Barns, MD Department of Hematology/Oncology [facility] at [facility] Phone: [phone] Pager: [phone] Email: [email or web address]  ADDENDUM Spoke with pharmacy. It is a titrated schedule based on patient's tolerance and vitals: - Start at 30 mg/kg/hr for 15 minutes. - If tolerated, increase to 60 mg/kg/hr for 15 more minutes - If tolerated, increase to 120 mg/kg/hr for 15 more minutes - If tolerated, increase to 240 mg/kg/hr and hold steady there. (Speak with either pharmacy or heme onc for further direction at that time.)  Main pharmacy [phone]  Fayette Pho, MD Pager [phone]

## 2020-08-18 NOTE — Evaluation (Signed)
Occupational Therapy Evaluation Patient Details Name: Paula Reed MRN: [id number] DOB: Nov 24, 1945 Today's Date: 08/18/2020    History of Present Illness Pt is a 75 y.o. female admitted 08/12/20 with stridor and dyspnea with dysphagia; ETT 4/18-4/22. Found to have supraglottic edema and discoloration concerning for a hematoma. Neck CT with possible laryngeal edema. Chest CT with small mediastinal nodes and mild bilateral lower lobe atelectasis, trace bilateral pleural effusions. PMH includes Alzheimer's dementia, HTN, DM, seizures.   Clinical Impression   Pt PTA: pt living with family, dementia at baseline, but per chart, was ambulatory at times, but recently more bed bound and required assist from family. Pt currently, minA for bed mobility and minguardA for ADL tasks. Pt standing at sink for grooming tasks. Pt ambulatory in room with and without RW requiring assist for sequencing in unfamiliar place. Pt with deficits mostly in worsened cognition. Pt would greatly benefit from continued OT skilled services. OT following acutely.    Follow Up Recommendations  Home health OT;Supervision/Assistance - 24 hour    Equipment Recommendations  None recommended by OT    Recommendations for Other Services       Precautions / Restrictions Precautions Precautions: Fall Restrictions Weight Bearing Restrictions: No      Mobility Bed Mobility Overal bed mobility: Needs Assistance Bed Mobility: Supine to Sit;Sit to Supine     Supine to sit: Min assist Sit to supine: Supervision   General bed mobility comments: cues to get to EOB and to get to middle of bed    Transfers Overall transfer level: Needs assistance Equipment used: None;Rolling walker (2 wheeled) Transfers: Sit to/from Stand Sit to Stand: Min guard         General transfer comment: x2 momentous tries and stood    Balance Overall balance assessment: Needs assistance   Sitting balance-Leahy Scale: Fair        Standing balance-Leahy Scale: Fair Standing balance comment: performing grooming at sink                           ADL either performed or assessed with clinical judgement   ADL Overall ADL's : At baseline                                       General ADL Comments: Pt supervisionA to minguardA for stability with mobility in unfamiliar place. Pt ambulatory in room with RW for stability. Pt with 1 LOB episode when getting off of commode, she backed up to wall and was surpised that it was there.     Vision Baseline Vision/History: Wears glasses Additional Comments: continue to assess; pt unable to attend to visual scanning task     Perception     Praxis      Pertinent Vitals/Pain Pain Assessment: No/denies pain     Hand Dominance Right   Extremity/Trunk Assessment Upper Extremity Assessment Upper Extremity Assessment: Overall WFL for tasks assessed   Lower Extremity Assessment Lower Extremity Assessment: Generalized weakness   Cervical / Trunk Assessment Cervical / Trunk Assessment: Normal   Communication Communication Communication: No difficulties   Cognition Arousal/Alertness: Awake/alert Behavior During Therapy: WFL for tasks assessed/performed Overall Cognitive Status: History of cognitive impairments - at baseline Area of Impairment: Orientation;Attention;Memory;Following commands;Safety/judgement;Awareness;Problem solving                 Orientation Level: Disoriented to;Place;Time;Situation Current  Attention Level: Sustained;Selective Memory: Decreased short-term memory Following Commands: Follows one step commands with increased time Safety/Judgement: Decreased awareness of safety;Decreased awareness of deficits Awareness: Intellectual Problem Solving: Requires verbal cues General Comments: H/o dementia; husband seemed surprised by pt's current level of disorientation. Pt would not believe that she was in the hospital. Pt  able to perform automatic tasks well, but very poor safety awareness   General Comments  no family present. Pt talking tangentially.    Exercises     Shoulder Instructions      Home Living Family/patient expects to be discharged to:: Private residence Living Arrangements: Spouse/significant other Available Help at Discharge: Family;Available 24 hours/day Type of Home: House                       Home Equipment: Walker - 2 wheels;Cane - single point   Additional Comments: Husband available 24/7; son also assists, but works as well      Prior Functioning/Environment Level of Independence: Needs Financial planner / Transfers Assistance Needed: Husband reports pt ambulatory without DME, does not use SPC or RW but has used before ADL's / Homemaking Assistance Needed: Husband reports pt typically able to perform these tasks herself, but states, "We've had to help before"   Comments: Pt A&Ox1 and unreliable historian - husband present for PT eval at end of session to provide PLOF        OT Problem List: Decreased cognition;Decreased safety awareness      OT Treatment/Interventions: Self-care/ADL training;Energy conservation;Cognitive remediation/compensation;Visual/perceptual remediation/compensation;Therapeutic activities;Neuromuscular education    OT Goals(Current goals can be found in the care plan section) Acute Rehab OT Goals Patient Stated Goal: Return home with family support OT Goal Formulation: With patient Time For Goal Achievement: 09/01/20 Potential to Achieve Goals: Fair ADL Goals Pt Will Transfer to Toilet: with modified independence;ambulating Additional ADL Goal #1: Pt will perform bed mobility with superivisonA as precursor for ADL. Additional ADL Goal #2: Pt will increase to OOB ADL x10 mins with 1 seated rest break to increase activity tolerance. Additional ADL Goal #3: pt will state location with minimal cues.  OT Frequency: Min 2X/week   Barriers  to D/C:            Co-evaluation              AM-PAC OT "6 Clicks" Daily Activity     Outcome Measure Help from another person eating meals?: A Little Help from another person taking care of personal grooming?: A Little Help from another person toileting, which includes using toliet, bedpan, or urinal?: A Little Help from another person bathing (including washing, rinsing, drying)?: A Little Help from another person to put on and taking off regular upper body clothing?: A Little Help from another person to put on and taking off regular lower body clothing?: A Little 6 Click Score: 18   End of Session Equipment Utilized During Treatment: Gait belt;Rolling walker Nurse Communication: Mobility status  Activity Tolerance: Patient tolerated treatment well Patient left: in bed;with call bell/phone within reach;with bed alarm set  OT Visit Diagnosis: Unsteadiness on feet (R26.81);Other symptoms and signs involving cognitive function                Time: 6213-0865 OT Time Calculation (min): 38 min Charges:  OT General Charges $OT Visit: 1 Visit OT Evaluation $OT Eval Moderate Complexity: 1 Mod OT Treatments $Self Care/Home Management : 8-22 mins $Therapeutic Activity: 8-22 mins  Flora Lipps, OTR/L Acute Rehabilitation  Services Pager: [phone] Office: [phone]   Nijah Tejera C 08/18/2020, 3:18 PM

## 2020-08-18 NOTE — Progress Notes (Signed)
FPTS Interim Progress Note  S: Notified by nurse that patient is attempting to get out of bed. Went to bedside with Dr. Leary Roca to evaluate the patient. Remained at bedside for over 40 minutes attempting to constantly redirect patient. Patient alert and oriented to name only, thinks she is at home and it is 74. Hallucinating kids and nurse reports that she has hallucinated prior to [place] arriving.  After multiple attempts of consistent redirection, patient still attempting get out of bed and being intermittently verbally abusive. Likely sundowning given history of dementia. Ordered seroquel 25 mg but patient still attempted to get out of bed multiple times in spite of other conservative interventions. Ordered posey. Patient laying in bed comfortably with posey on after receiving seroquel upon our departure. Will plan to remove posey once patient becomes more calm and no longer attempts to get out of bed risking falls or injury.    Reece Leader, DO 08/18/2020, 9:05 PM PGY-1, [facility] Service pager [phone]

## 2020-08-18 NOTE — Progress Notes (Signed)
   [facility] has been requested to perform a transesophageal echocardiogram on Paula Reed for bacteremia.  After careful review of history and examination, the risks and benefits of transesophageal echocardiogram have been explained including risks of esophageal damage, perforation (1:10,000 risk), bleeding, pharyngeal hematoma as well as other potential complications associated with conscious sedation including aspiration, arrhythmia, respiratory failure and death. Alternatives to treatment were discussed, questions were answered. Patient is willing to proceed.   Patient is still having some confusion and is not fully oriented. Discussed procedure with both husband and son and they are both in agreement with proceeding with TEE. Husband, Ashleigh Luckow, signed the consent form and I have placed this in paper chart.   Of note, patient does have acute anemia. Hemoglobin 7.1 today. Baseline 12-13. 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 Patient is receiving Privigen. Patient has repeat CBC ordered for the morning so we will need to make sure that hemoglobin is stable before TEE.  Corrin Parker, PA-C 08/18/2020 3:05 PM

## 2020-08-18 NOTE — Plan of Care (Signed)
  Problem: Nutrition: Goal: Adequate nutrition will be maintained Outcome: Progressing   Problem: Activity: Goal: Ability to tolerate increased activity will improve Outcome: Progressing   Problem: Respiratory: Goal: Ability to maintain a clear airway and adequate ventilation will improve Outcome: Progressing   Problem: Clinical Measurements: Goal: Will remain free from infection Outcome: Progressing Goal: Diagnostic test results will improve Outcome: Progressing Goal: Respiratory complications will improve Outcome: Progressing   Problem: Activity: Goal: Risk for activity intolerance will decrease Outcome: Progressing   Problem: Elimination: Goal: Will not experience complications related to bowel motility Outcome: Progressing Goal: Will not experience complications related to urinary retention Outcome: Progressing   Problem: Pain Managment: Goal: General experience of comfort will improve Outcome: Progressing   Problem: Safety: Goal: Ability to remain free from injury will improve Outcome: Progressing   Problem: Skin Integrity: Goal: Risk for impaired skin integrity will decrease Outcome: Progressing

## 2020-08-18 NOTE — Evaluation (Addendum)
Physical Therapy Evaluation Patient Details Name: Paula Reed MRN: [id number] DOB: 15-May-1945 Today's Date: 08/18/2020   History of Present Illness  Pt is a 75 y.o. female admitted 08/12/20 with stridor and dyspnea with dysphagia; ETT 4/18-4/22. Found to have supraglottic edema and discoloration concerning for a hematoma. Neck CT with possible laryngeal edema. Chest CT with small mediastinal nodes and mild bilateral lower lobe atelectasis, trace bilateral pleural effusions. PMH includes Alzheimer's dementia, HTN, DM, seizures.    Clinical Impression  Pt presents with an overall decrease in functional mobility secondary to above. Pt A&O to self only, unreliable historian (states she is 75 y.o. and lives with parents); husband present at end of session and reports pt typically ambulatory without DME, husband and son provide assist as needed. Today, pt moving fairly well for how long she has been bedbound/intubated, requiring occasional min-modA to prevent LOB due to poor balance and impaired cognition with decreased safety awareness. Pt would benefit from continued acute PT services to maximize functional mobility and independence prior to d/c with HHPT services with 24/7 support for safety.   SpO2 98-100% on RA, HR 103    Follow Up Recommendations Home health PT;Supervision/Assistance - 24 hour    Equipment Recommendations  None recommended by PT    Recommendations for Other Services       Precautions / Restrictions Precautions Precautions: Fall Restrictions Weight Bearing Restrictions: No      Mobility  Bed Mobility Overal bed mobility: Needs Assistance Bed Mobility: Supine to Sit;Sit to Supine     Supine to sit: Min assist Sit to supine: Supervision   General bed mobility comments: MinA for HHA to elevate trunk; return to supine without assist; verbal cues to stay on task    Transfers Overall transfer level: Needs assistance Equipment used: None;Rolling walker (2  wheeled) Transfers: Sit to/from Stand Sit to Stand: Min guard;Min assist         General transfer comment: Pt reliant on momentum to power into standing, able to stand with min guard but then LOB back to sitting on bed, additional trial with RW and minA for trunk elevation; pt able to stand from low toilet height with grab bar and min guard; poor safety awareness  Ambulation/Gait Ambulation/Gait assistance: Min guard;Min assist;Mod assist Gait Distance (Feet): 26 Feet Assistive device: Rolling walker (2 wheeled);None Gait Pattern/deviations: Step-through pattern;Decreased stride length;Trunk flexed;Staggering left;Staggering right Gait velocity: Decreased   General Gait Details: Pt taking a few steps with RW, then quickly leaving it behind/walking through it and ambulating with intermittent UE support on furniture/counter; close min guard for balance with periods of min-modA to prevent LOB due to instability; pt with poor insight into LOB and safety  Stairs            Wheelchair Mobility    Modified Rankin (Stroke Patients Only)       Balance Overall balance assessment: Needs assistance   Sitting balance-Leahy Scale: Fair Sitting balance - Comments: Able to don sock sitting EOB     Standing balance-Leahy Scale: Fair Standing balance comment: Can static stand without UE support, unable to accept challenge                             Pertinent Vitals/Pain Pain Assessment: No/denies pain    Home Living Family/patient expects to be discharged to:: Private residence Living Arrangements: Spouse/significant other Available Help at Discharge: Family;Available 24 hours/day Type of Home: House  Home Equipment: Walker - 2 wheels;Cane - single point Additional Comments: Husband available 24/7; son also assists, but works as well    Prior Function Level of Independence: Needs Geologist, engineering / Transfers Assistance Needed: Husband reports pt  ambulatory without DME, does not use SPC or RW but has used before  ADL's / Homemaking Assistance Needed: Husband reports pt typically able to perform these tasks herself, but states, "We've had to help before"  Comments: Pt A&Ox1 and unreliable historian - husband present at end of session to provide PLOF     Hand Dominance        Extremity/Trunk Assessment   Upper Extremity Assessment Upper Extremity Assessment: Overall WFL for tasks assessed    Lower Extremity Assessment Lower Extremity Assessment: Generalized weakness       Communication   Communication: No difficulties  Cognition Arousal/Alertness: Awake/alert Behavior During Therapy: WFL for tasks assessed/performed Overall Cognitive Status: History of cognitive impairments - at baseline Area of Impairment: Orientation;Attention;Memory;Following commands;Safety/judgement;Awareness;Problem solving                 Orientation Level: Disoriented to;Place;Time;Situation Current Attention Level: Sustained;Selective Memory: Decreased short-term memory Following Commands: Follows one step commands with increased time Safety/Judgement: Decreased awareness of safety;Decreased awareness of deficits Awareness: Intellectual Problem Solving: Requires verbal cues General Comments: H/o dementia; husband seemed surprised by pt's current level of disorientation. Pt initially stated she is 75 y.o. and lives with parents; pt later telling husband she's 17, no 19. Pt able to perform automatic tasks well, but very poor safety awareness      General Comments General comments (skin integrity, edema, etc.): Husband present at end of session; updated him on pt's current mobility and cognitive status. Husband plans to have pt return home with family support    Exercises     Assessment/Plan    PT Assessment Patient needs continued PT services  PT Problem List Decreased strength;Decreased activity tolerance;Decreased  balance;Decreased mobility;Decreased cognition;Decreased knowledge of use of DME;Decreased safety awareness       PT Treatment Interventions DME instruction;Gait training;Stair training;Functional mobility training;Therapeutic activities;Therapeutic exercise;Balance training;Cognitive remediation;Patient/family education    PT Goals (Current goals can be found in the Care Plan section)  Acute Rehab PT Goals Patient Stated Goal: Return home with family support PT Goal Formulation: With family Time For Goal Achievement: 09/01/20 Potential to Achieve Goals: Good    Frequency Min 3X/week   Barriers to discharge        Co-evaluation               AM-PAC PT "6 Clicks" Mobility  Outcome Measure Help needed turning from your back to your side while in a flat bed without using bedrails?: A Little Help needed moving from lying on your back to sitting on the side of a flat bed without using bedrails?: A Little Help needed moving to and from a bed to a chair (including a wheelchair)?: A Little Help needed standing up from a chair using your arms (e.g., wheelchair or bedside chair)?: A Little Help needed to walk in hospital room?: A Lot Help needed climbing 3-5 steps with a railing? : A Lot 6 Click Score: 16    End of Session Equipment Utilized During Treatment: Gait belt Activity Tolerance: Patient tolerated treatment well Patient left: in bed;with call bell/phone within reach;with bed alarm set Nurse Communication: Mobility status PT Visit Diagnosis: Other abnormalities of gait and mobility (R26.89);Muscle weakness (generalized) (M62.81)    Time: 2595-6387 PT Time  Calculation (min) (ACUTE ONLY): 25 min   Charges:   PT Evaluation $PT Eval Moderate Complexity: 1 Mod PT Treatments $Therapeutic Activity: 8-22 mins   Ina Homes, PT, DPT Acute Rehabilitation Services  Pager [phone] Office [phone]  Malachy Chamber 08/18/2020, 10:55 AM

## 2020-08-19 ENCOUNTER — Encounter (HOSPITAL_COMMUNITY)
Admission: EM | Disposition: A | Discharge status: Home Health | Payer: Self-pay | Source: Home / Self Care | Attending: Pulmonary Disease

## 2020-08-19 ENCOUNTER — Encounter (HOSPITAL_COMMUNITY): Payer: Self-pay | Admitting: Certified Registered"

## 2020-08-19 ENCOUNTER — Encounter (HOSPITAL_COMMUNITY): Payer: Self-pay

## 2020-08-19 ENCOUNTER — Other Ambulatory Visit (HOSPITAL_COMMUNITY): Payer: Medicare Other

## 2020-08-19 DIAGNOSIS — R7881 Bacteremia: Secondary | ICD-10-CM

## 2020-08-19 DIAGNOSIS — E119 Type 2 diabetes mellitus without complications: Secondary | ICD-10-CM

## 2020-08-19 DIAGNOSIS — G309 Alzheimer's disease, unspecified: Secondary | ICD-10-CM | POA: Diagnosis not present

## 2020-08-19 DIAGNOSIS — E1149 Type 2 diabetes mellitus with other diabetic neurological complication: Secondary | ICD-10-CM | POA: Diagnosis not present

## 2020-08-19 DIAGNOSIS — R061 Stridor: Secondary | ICD-10-CM | POA: Diagnosis not present

## 2020-08-19 DIAGNOSIS — D649 Anemia, unspecified: Secondary | ICD-10-CM

## 2020-08-19 DIAGNOSIS — J988 Other specified respiratory disorders: Secondary | ICD-10-CM | POA: Diagnosis not present

## 2020-08-19 DIAGNOSIS — D6851 Activated protein C resistance: Secondary | ICD-10-CM | POA: Diagnosis not present

## 2020-08-19 DIAGNOSIS — D689 Coagulation defect, unspecified: Secondary | ICD-10-CM

## 2020-08-19 DIAGNOSIS — F0280 Dementia in other diseases classified elsewhere without behavioral disturbance: Secondary | ICD-10-CM | POA: Diagnosis not present

## 2020-08-19 LAB — BASIC METABOLIC PANEL
Anion gap: 5 (ref 5–15)
BUN: 14 mg/dL (ref 8–23)
CO2: 25 mmol/L (ref 22–32)
Calcium: 9 mg/dL (ref 8.9–10.3)
Chloride: 103 mmol/L (ref 98–111)
Creatinine, Ser: 0.54 mg/dL (ref 0.44–1.00)
GFR, Estimated: >60 mL/min (ref >60)
Glucose, Bld: 218 mg/dL — ABNORMAL HIGH (ref 70–99)
Potassium: 3.9 mmol/L (ref 3.5–5.1)
Sodium: 133 mmol/L — ABNORMAL LOW (ref 135–145)

## 2020-08-19 LAB — GLUCOSE, CAPILLARY
Glucose-Capillary: 222 mg/dL — ABNORMAL HIGH (ref 70–99)
Glucose-Capillary: 199 mg/dL — ABNORMAL HIGH (ref 70–99)
Glucose-Capillary: 343 mg/dL — ABNORMAL HIGH (ref 70–99)
Glucose-Capillary: 284 mg/dL — ABNORMAL HIGH (ref 70–99)

## 2020-08-19 LAB — CBC
HCT: 23.2 % — ABNORMAL LOW (ref 36.0–46.0)
Hemoglobin: 7 g/dL — ABNORMAL LOW (ref 12.0–15.0)
MCH: 29.4 pg (ref 26.0–34.0)
MCHC: 30.2 g/dL (ref 30.0–36.0)
MCV: 97.5 fL (ref 80.0–100.0)
Platelets: 444 10*3/uL — ABNORMAL HIGH (ref 150–400)
RBC: 2.38 MIL/uL — ABNORMAL LOW (ref 3.87–5.11)
RDW: 19.9 % — ABNORMAL HIGH (ref 11.5–15.5)
WBC: 14.4 10*3/uL — ABNORMAL HIGH (ref 4.0–10.5)
nRBC: 0.5 % — ABNORMAL HIGH (ref 0.0–0.2)

## 2020-08-19 LAB — PTT FACTOR INHIBITOR (MIXING STUDY)
aPTT 1:1 NP Incub. Mix Ctl: 34.2 s — ABNORMAL HIGH (ref 22.9–30.2)
aPTT 1:1 NP Mix, 60 Min,Incub.: 51.3 s — ABNORMAL HIGH (ref 22.9–30.2)
aPTT 1:1 Normal Plasma: 32 s — ABNORMAL HIGH (ref 22.9–30.2)
aPTT: 47.9 s — ABNORMAL HIGH (ref 22.9–30.2)

## 2020-08-19 SURGERY — CANCELLED PROCEDURE

## 2020-08-19 MED ORDER — SODIUM CHLORIDE 0.9 % IV SOLN: INTRAVENOUS | Status: DC

## 2020-08-19 MED ORDER — INSULIN GLARGINE 100 UNIT/ML ~~LOC~~ SOLN
12.0000 [IU] | Freq: Every day | SUBCUTANEOUS | Status: DC
  Administered 2020-08-20: 12 [IU] via SUBCUTANEOUS
  Filled 2020-08-19: qty 0.12

## 2020-08-19 MED ORDER — INSULIN GLARGINE 100 UNIT/ML ~~LOC~~ SOLN
2.0000 [IU] | Freq: Once | SUBCUTANEOUS | Status: AC
  Administered 2020-08-19: 2 [IU] via SUBCUTANEOUS
  Filled 2020-08-19: qty 0.02

## 2020-08-19 NOTE — Progress Notes (Signed)
Physical Therapy Treatment Patient Details Name: Paula Reed MRN: [id number] DOB: 12-26-1945 Today's Date: 08/19/2020    History of Present Illness Pt is a 75 y.o. female admitted 08/12/20 with stridor and dyspnea with dysphagia; ETT 4/18-4/22. Found to have supraglottic edema and discoloration concerning for a hematoma. Neck CT with possible laryngeal edema. Chest CT with small mediastinal nodes and mild bilateral lower lobe atelectasis, trace bilateral pleural effusions. PMH includes Alzheimer's dementia, HTN, DM, seizures.    PT Comments    Patient easily directed to participate. She is distracted as walking in hallway and looking into each room (but demonstrating good balance with head turns!). She returned to room after 80 feet due to wanting to find her shoes and didn't like walking without her shoes. (Shoes are not present in her room, but she does not understand this as she thinks she is at home). Unclear if her cognitive status is baseline.      Follow Up Recommendations  Home health PT;Supervision/Assistance - 24 hour     Equipment Recommendations  None recommended by PT    Recommendations for Other Services       Precautions / Restrictions Precautions Precautions: Fall    Mobility  Bed Mobility                    Transfers Overall transfer level: Needs assistance Equipment used: None Transfers: Sit to/from Stand Sit to Stand: Min guard         General transfer comment: x2 from chair without armrest  Ambulation/Gait Ambulation/Gait assistance: Min guard Gait Distance (Feet): 80 Feet Assistive device: 1 person hand held assist Gait Pattern/deviations: Step-through pattern;Decreased stride length;Trunk flexed Gait velocity: Decreased   General Gait Details: close min guard for safety but with no LOB today; pt looking left and right while walking   Stairs             Wheelchair Mobility    Modified Rankin (Stroke Patients Only)        Balance Overall balance assessment: Needs assistance   Sitting balance-Leahy Scale: Fair       Standing balance-Leahy Scale: Fair Standing balance comment: performing grooming at sink                            Cognition Arousal/Alertness: Awake/alert Behavior During Therapy: WFL for tasks assessed/performed Overall Cognitive Status: No family/caregiver present to determine baseline cognitive functioning (has history of dementia, but unclear if at baseline) Area of Impairment: Orientation;Attention;Memory;Following commands;Safety/judgement;Awareness;Problem solving                 Orientation Level: Disoriented to;Place;Time;Situation Current Attention Level: Sustained;Selective Memory: Decreased short-term memory Following Commands: Follows one step commands inconsistently Safety/Judgement: Decreased awareness of safety;Decreased awareness of deficits   Problem Solving: Requires verbal cues General Comments: follows most cues the first attempt; perseverating on where her shoes are      Exercises      General Comments        Pertinent Vitals/Pain Pain Assessment: No/denies pain    Home Living                      Prior Function            PT Goals (current goals can now be found in the care plan section) Acute Rehab PT Goals Patient Stated Goal: Return home with family support Time For Goal Achievement: 09/01/20 Potential to Achieve  Goals: Good Progress towards PT goals: Progressing toward goals    Frequency    Min 3X/week      PT Plan Current plan remains appropriate    Co-evaluation              AM-PAC PT "6 Clicks" Mobility   Outcome Measure  Help needed turning from your back to your side while in a flat bed without using bedrails?: A Little Help needed moving from lying on your back to sitting on the side of a flat bed without using bedrails?: A Little Help needed moving to and from a bed to a chair  (including a wheelchair)?: A Little Help needed standing up from a chair using your arms (e.g., wheelchair or bedside chair)?: A Little Help needed to walk in hospital room?: A Little Help needed climbing 3-5 steps with a railing? : A Lot 6 Click Score: 17    End of Session Equipment Utilized During Treatment: Gait belt Activity Tolerance: Patient tolerated treatment well Patient left: with call bell/phone within reach;in chair;with chair alarm set Nurse Communication: Mobility status PT Visit Diagnosis: Other abnormalities of gait and mobility (R26.89);Muscle weakness (generalized) (M62.81)     Time: 9163-8466 PT Time Calculation (min) (ACUTE ONLY): 12 min  Charges:  $Gait Training: 8-22 mins                      Jerolyn Center, PT Pager [phone]    Zena Amos 08/19/2020, 3:04 PM

## 2020-08-19 NOTE — Plan of Care (Signed)
  Problem: Nutrition: Goal: Adequate nutrition will be maintained Outcome: Progressing   Problem: Activity: Goal: Ability to tolerate increased activity will improve Outcome: Progressing   Problem: Respiratory: Goal: Ability to maintain a clear airway and adequate ventilation will improve Outcome: Progressing   Problem: Clinical Measurements: Goal: Will remain free from infection Outcome: Progressing Goal: Diagnostic test results will improve Outcome: Progressing Goal: Respiratory complications will improve Outcome: Progressing   Problem: Activity: Goal: Risk for activity intolerance will decrease Outcome: Progressing   Problem: Elimination: Goal: Will not experience complications related to bowel motility Outcome: Progressing Goal: Will not experience complications related to urinary retention Outcome: Progressing   Problem: Pain Managment: Goal: General experience of comfort will improve Outcome: Progressing   Problem: Safety: Goal: Ability to remain free from injury will improve Outcome: Progressing   Problem: Skin Integrity: Goal: Risk for impaired skin integrity will decrease Outcome: Progressing   Problem: Safety: Goal: Non-violent Restraint(s) Outcome: Progressing

## 2020-08-19 NOTE — Progress Notes (Addendum)
  Speech Language Pathology Treatment: Dysphagia  Patient Details Name: Paula Reed MRN: [id number] DOB: 07-Jul-1945 Today's Date: 08/19/2020 Time: 1420-1430 SLP Time Calculation (min) (ACUTE ONLY): 10 min  Assessment / Plan / Recommendation Clinical Impression  Pt much more alert and responsive than in prior eval. She was upright in chair, denied hunger or thirst after being NPO all day for TEE. RN is unsure if she tolerated clears well given NPO today. Pt now with dentures at bedside. Pt able to self feed and masticate well. One instance of cough after thins, but otherwise well tolerated. Pt takes very small sips.  Will f/u x1 to assess tolerance of regular thin.   HPI HPI: 75 year old woman with history of DMII, HTN, HLD, seizures and mild Alzheimer's dementia who is admitted to the ICU for stridor. She was intubated in the ER on 4/18 and noted to have supraglottis swelling and discoloration consistent with hematoma of her airway.  CT neck demonstrated mild/possible laryngeal edema w/out any evidence of LAD. CT chest demonstrated small mediastinal nodes and mild bilateral lower lobe atelectasis with trace bilateral pleural effusions. ENT scope while intubated shows "submucosal hemorrhage extending from the nasopharynx down to the posterior glottis extending onto the arytenoid towers bilaterally.  It did not appear infected nor was there a discrete mass on exam; rather, this looked liked bruised mucosa suggestive of a spontaneous submucosal hemorrhage of unknown etiology." intubated 4/18-4/22      SLP Plan  All goals met       Recommendations  Diet recommendations: Regular;Thin liquid Liquids provided via: Cup;Straw Medication Administration: Whole meds with puree Supervision: Patient able to self feed                Oral Care Recommendations: Oral care BID Follow up Recommendations: 24 hour supervision/assistance SLP Visit Diagnosis: Dysphagia, unspecified (R13.10) Plan: All  goals met       GO               Herbie [place], MA [place] Pager [phone] Office [phone]  Lynann Beaver 08/19/2020, 2:44 PM

## 2020-08-19 NOTE — Progress Notes (Signed)
PHARMACY CONSULT NOTE FOR:  OUTPATIENT  PARENTERAL ANTIBIOTIC THERAPY (OPAT)  Indication: Strep mitis bacteremia Regimen: Ceftriaxone 2g IV q24 hours End date: 08/27/2020 (14 days total due to patient having underlying immunosuppression)  IV antibiotic discharge orders are pended. To discharging provider:  please sign these orders via discharge navigator,  Select New Orders & click on the button choice - Manage This Unsigned Work.     Thank you for allowing pharmacy to be a part of this patient's care.  Rexford Maus, PharmD PGY-1 Acute Care Pharmacy Resident 08/19/2020 11:48 AM

## 2020-08-19 NOTE — Progress Notes (Signed)
Occupational Therapy Treatment Patient Details Name: Paula Reed MRN: [id number] DOB: 01-16-1946 Today's Date: 08/19/2020    History of present illness Pt is a 75 y.o. female admitted 08/12/20 with stridor and dyspnea with dysphagia; ETT 4/18-4/22. Found to have supraglottic edema and discoloration concerning for a hematoma. Neck CT with possible laryngeal edema. Chest CT with small mediastinal nodes and mild bilateral lower lobe atelectasis, trace bilateral pleural effusions. PMH includes Alzheimer's dementia, HTN, DM, seizures.   OT comments  Pt progressing. Pt minguardA after set-up for toilet hygiene, and grooming at sink in standing x5 mins. Pt supervisionA to minguardA for stability with mobility in unfamiliar place. Pt ambulatory in room with occasional hand held assist for stability. Pt ambulatory in room x3 laps in room. VSS on RA. Pt slightly more clear with cognition today and following 90% of commands.  Pt would benefit from continued OT skilled services. OT following acutely.    Follow Up Recommendations  Home health OT;Supervision/Assistance - 24 hour    Equipment Recommendations  None recommended by OT    Recommendations for Other Services      Precautions / Restrictions Precautions Precautions: Fall Restrictions Weight Bearing Restrictions: No       Mobility Bed Mobility Overal bed mobility: Needs Assistance Bed Mobility: Supine to Sit;Sit to Supine     Supine to sit: Supervision Sit to supine: Min guard   General bed mobility comments: cues to get to middle of bed and to scoot toward [facility]    Transfers Overall transfer level: Needs assistance Equipment used: None Transfers: Sit to/from Stand Sit to Stand: Min guard         General transfer comment: from bed and commode    Balance Overall balance assessment: Needs assistance   Sitting balance-Leahy Scale: Fair       Standing balance-Leahy Scale: Fair Standing balance comment: performing  grooming at sink                           ADL either performed or assessed with clinical judgement   ADL Overall ADL's : At baseline                                       General ADL Comments: Pt minguardA after set-up for toilet hygiene, and grooming at sink in standing. Pt supervisionA to minguardA for stability with mobility in unfamiliar place. Pt ambulatory in room with occasional hand held assist for stability. Pt ambulatory in room x3 laps in room to door.     Vision       Perception     Praxis      Cognition Arousal/Alertness: Awake/alert Behavior During Therapy: WFL for tasks assessed/performed Overall Cognitive Status: History of cognitive impairments - at baseline Area of Impairment: Orientation;Attention;Memory;Following commands;Safety/judgement;Awareness;Problem solving                 Orientation Level: Disoriented to;Place;Time;Situation Current Attention Level: Sustained Memory: Decreased short-term memory Following Commands: Follows one step commands inconsistently Safety/Judgement: Decreased awareness of safety;Decreased awareness of deficits Awareness: Intellectual Problem Solving: Requires verbal cues General Comments: Pt stating "cone" for place 2 times throughout session by using contextual cues. Pt following most commands with verbal cues.        Exercises     Shoulder Instructions       General Comments VSS on RA. Pt slightly  more clear with cognition today and following 90% of commands.    Pertinent Vitals/ Pain       Pain Assessment: Faces Faces Pain Scale: No hurt Pain Intervention(s): Monitored during session  Home Living                                          Prior Functioning/Environment              Frequency  Min 2X/week        Progress Toward Goals  OT Goals(current goals can now be found in the care plan section)  Progress towards OT goals: Progressing toward  goals  Acute Rehab OT Goals Patient Stated Goal: Return home with family support OT Goal Formulation: With patient Time For Goal Achievement: 09/01/20 Potential to Achieve Goals: Fair ADL Goals Pt Will Transfer to Toilet: with modified independence;ambulating Additional ADL Goal #1: Pt will perform bed mobility with superivisonA as precursor for ADL. Additional ADL Goal #2: Pt will increase to OOB ADL x10 mins with 1 seated rest break to increase activity tolerance. Additional ADL Goal #3: pt will state location with minimal cues.  Plan Discharge plan remains appropriate    Co-evaluation                 AM-PAC OT "6 Clicks" Daily Activity     Outcome Measure   Help from another person eating meals?: A Little Help from another person taking care of personal grooming?: A Little Help from another person toileting, which includes using toliet, bedpan, or urinal?: A Little Help from another person bathing (including washing, rinsing, drying)?: A Little Help from another person to put on and taking off regular upper body clothing?: A Little Help from another person to put on and taking off regular lower body clothing?: A Little 6 Click Score: 18    End of Session Equipment Utilized During Treatment: Gait belt  OT Visit Diagnosis: Unsteadiness on feet (R26.81);Other symptoms and signs involving cognitive function   Activity Tolerance Patient tolerated treatment well   Patient Left in bed;with call bell/phone within reach;with bed alarm set ((waiting for transport to take to TEE))   Nurse Communication Mobility status        Time: 1937-9024 OT Time Calculation (min): 18 min  Charges: OT General Charges $OT Visit: 1 Visit OT Treatments $Self Care/Home Management : 8-22 mins  Flora Lipps, OTR/L Acute Rehabilitation Services Pager: [phone] Office: [phone]    Shelisha Gautier C 08/19/2020, 4:29 PM

## 2020-08-19 NOTE — Progress Notes (Signed)
Case reviewed.  Patient admitted with respiratory distress secondary to airway compromise with stridor and dysphagia.  Emergently intubated and found to have mucosal edema in pharynx c/w pharyngeal hematoma per CT.  She remains anemic with Hbg 7.  She was just extubated yesterday.  Given recent retropharyngeal hematoma resulting in airway compromise and intubation, I do not think it is a good idea to place a TEE probe in that area at this time.  2D echo reviewed and no obvious vegetation.  Recommend ID input but at this time would avoid TEE and consider longer course of antbs for bacteremia.  Discussed with Teaching service.

## 2020-08-19 NOTE — Care Management Important Message (Signed)
Important Message  Patient Details  Name: Paula Reed MRN: [id number] Date of Birth: Oct 16, 1945   Medicare Important Message Given:  Yes     [name] Stefan Church 08/19/2020, 1:55 PM

## 2020-08-19 NOTE — Progress Notes (Signed)
Family Medicine Teaching Service Daily Progress Note Intern Pager: [phone]  Patient name: Paula Reed Medical record number: [id number] Date of birth: 1946-04-10 Age: 75 y.o. Gender: female  Primary Care Provider: Latrelle Dodrill, MD Consultants: CCM, heme, ENT, ID Code Status: Full  Pt Overview and Major Events to Date:  4/18-admitted to ICU 4/22-extubated 4/24-transferred FPTS  Assessment and Plan: 75 year old woman s/p ICU stay and intubation for dysphagia and stridor. PMHx seizures, Alzheimer's dementia, hypertension, hyperlipidemia, T2DM.  Acute stridor with acute hypoxic respiratory failure-improved SpO2 >97% on room air. Breathing comfortably.  - continue to monitor  Anemia with elevated PTT and normal PT This morning's Hgb 7.0, transfusion threshold 7. Baseline 12-13.  - hematology on board, appreciate ongoing care - day #2 high dose IVIG per heme - continue solumedrol per heme - transfusion threshold 7 - AM CBC  Strep mitis bacteremia: WBC increased from 10 to 14 today. Patient afebrile last 24 hours.  - consult ID for suggestions - currently day #6 of ceftriaxone 2g q24 hr - current plan for antibiotics total 14 days  - may need PICC to go back home with IV antibiotics  Type 2 diabetes: CBGs recently 218, 249. Currently on steroids as above.  - lantus increased from 10 to 12 units - moderate SSI with HS coverage  HTN  HLD BP modestly elevated last 24 hours with systolics in 130s.  - continue amlodipine 5, lipitor 80   FEN/GI: NPO for TEE PPx: SCDs for bleed risk   Status is: Inpatient  Remains inpatient appropriate because:Ongoing diagnostic testing needed not appropriate for outpatient work up, IV treatments appropriate due to intensity of illness or inability to take PO and Inpatient level of care appropriate due to severity of illness   Dispo: The patient is from: Home              Anticipated d/c is to: Home              Patient  currently is not medically stable to d/c.   Difficult to place patient No   Subjective:  Patient has no complaints. Reports breathing fine, does not feel SOB. No chest pain. No belly pain. No difficulty voiding.   Objective: Temp:  [97.8 F (36.6 C)-98.9 F (37.2 C)] 98.1 F (36.7 C) (04/24 2012) Pulse Rate:  [71-91] 71 (04/24 1745) Resp:  [16-20] 18 (04/24 2012) BP: (118-161)/(53-93) 161/78 (04/24 2012) SpO2:  [100 %] 100 % (04/24 2012) Weight:  [92.6 kg] 92.6 kg (04/25 0500) Physical Exam: General: awake, alert, oriented only to self, reports taking care of own ADLs at home Cardiovascular: RRR no murmur Respiratory: CTAB Abdomen: soft, non tender  Laboratory: Recent Labs  Lab 08/17/20 0532 08/17/20 1439 08/18/20 0502 08/19/20 0226  WBC 15.5*  --  10.9* 14.4*  HGB 7.0* 7.9* 7.1* 7.0*  HCT 24.3* 26.6* 23.2* 23.2*  PLT 415*  --  447* 444*   Recent Labs  Lab 08/15/20 0559 08/16/20 0529 08/17/20 0532 08/18/20 0502 08/19/20 0226  NA 138 139 140 133* 133*  K 3.3* 4.0 4.0 3.7 3.9  CL 105 104 105 100 103  CO2 26 28 25 24 25   BUN 19 24* 18 16 14   CREATININE 0.44 0.53 0.46 0.58 0.54  CALCIUM 8.7* 9.0 9.2 9.0 9.0  PROT 6.5 6.5  --  8.3*  --   BILITOT 0.9 0.9  --  1.7*  --   ALKPHOS 55 55  --  52  --  ALT 11 10  --  15  --   AST 15 16  --  20  --   GLUCOSE 196* 206* 121* 210* 218*    Imaging/Diagnostic Tests: None last 24 hours.   Fayette Pho, MD 08/19/2020, 7:26 AM PGY-1, [facility] Family Medicine FPTS Intern pager: [phone], text pages welcome

## 2020-08-19 NOTE — TOC Initial Note (Signed)
Transition of Care [facility]) - Initial/Assessment Note    Patient Details  Name: Paula Reed MRN: [id number] Date of Birth: 1945/07/08  Transition of Care [facility]) CM/SW Contact:    Joanne Chars, LCSW Phone Number: 08/19/2020, 11:09 AM  Clinical Narrative:   CSW met with pt regarding recommendations for [facility].  Pt pleasant, does not appear to be able to provide accurate information, states she is living with her parents, says her contact is her cousin.  Pt reports she is agreeable to [facility]  CSW checked face sheet and epic, husband and son involved with care, CSW brought choice document for [facility] back to pt and pt gives permission for CSW to speak to husband and son.   CSW spoke with husband [name].  He and wife are in [place]  Eduard Clos reports there is and RN that comes to the home, he does not recall what agency or have a phone number, but he will look for this.  He is agreeable to [facility].  CSW spoke with son Kyung Rudd.  His parents may be moving to an apartment complex that is smaller as they are downsizing.  CSW discussed recommendation for [facility], Kyung Rudd not sure where the RN is from as he is always at work when she comes by, he will contact his father and see what they can figure out.  He called back later, RN is Beaulah Dinning but still not info on agency or contact phone number.   CSW looked through epic, pt seen at [place], note does reference an RN, spoke with reception at [place], CSW there is not available.  Does not appear to be [facility] but some sort of RN through the PCP.                 Expected Discharge Plan: [place] Services Barriers to Discharge: Other (comment),Continued Medical Work up [facility] with [employer])   Patient Goals and CMS Choice Patient states their goals for this hospitalization and ongoing recovery are:: "I don't know" CMS Medicare.gov Compare Post Acute Care list provided to:: Patient Choice  offered to / list presented to : Patient  Expected Discharge Plan and Services Expected Discharge Plan: [place] In-house Referral: Clinical Social Work   Post Acute Care Choice: [place] arrangements for the past 2 months: [place]                 DME Arranged: N/A         HH Arranged: PT,OT [place] Agency: [place] Date [place]: 08/19/20 Time [place]: 1054 Representative spoke with at [place]: Tommi Rumps  Prior Living Arrangements/Services Living arrangements for the past 2 months: [place] Lives with:: Spouse Patient language and need for interpreter reviewed:: Yes Do you feel safe going back to the place where you live?: Yes      Need for Family Participation in Patient Care: Yes (Comment) Care giver support system in place?: Yes (comment) Current home services: Home OT,Home PT Criminal Activity/Legal Involvement Pertinent to Current Situation/Hospitalization: No - Comment as needed  Activities of Daily Living Home Assistive Devices/Equipment: Cane (specify quad or straight),Dentures (specify type) (upper and lower) ADL Screening (condition at time of admission) Patient's cognitive ability adequate to safely complete daily activities?: No Is the patient deaf or have difficulty hearing?: No Does the patient have difficulty seeing, even when wearing glasses/contacts?: No  Does the patient have difficulty concentrating, remembering, or making decisions?: No Patient able to express need for assistance with ADLs?: Yes Does the patient have difficulty dressing or bathing?: No Independently performs ADLs?: Yes (appropriate for developmental age) Does the patient have difficulty walking or climbing stairs?: No Weakness of Legs: None Weakness of Arms/Hands: None  Permission Sought/Granted Permission sought to share information with : Family Supports Permission granted to share information with :  Yes, Verbal Permission Granted  Share Information with NAME: husband Eduard Clos, son Kyung Rudd           Emotional Assessment Appearance:: Appears stated age Attitude/Demeanor/Rapport: Engaged Affect (typically observed): Pleasant Orientation: : Oriented to Self Alcohol / Substance Use: Not Applicable Psych Involvement: No (comment)  Admission diagnosis:  Stridor [R06.1] Compromised airway [J98.8] Anemia, unspecified type [D64.9] Patient Active Problem List   Diagnosis Date Noted  . Compromised airway   . Hand pain, left 08/13/2020  . Stridor 08/12/2020  . Laryngeal edema   . Pain and swelling of right forearm 06/14/2020  . Pain due to onychomycosis of toenails of both feet 07/26/2019  . History of chronic cough 02/10/2019  . Ischemic cerebrovascular accident (CVA) ([place]) 08/26/2018  . MRSA bacteremia 06/01/2018  . Allergic rhinitis 05/12/2018  . Seizures ([place]) 06/28/2017  . Probable Alzheimer's dementia without behavioral disturbance 09/15/2015  . Rash and nonspecific skin eruption 12/18/2014  . Rash 09/20/2014  . Bruit of right carotid artery 03/26/2014  . Trigger finger 11/03/2012  . Heart murmur 04/22/2012  . Diabetes mellitus, type II ([place]) 02/02/2012  . Hypertension 02/02/2012  . Hyperlipidemia 02/02/2012  . Routine adult health maintenance 02/02/2012   PCP:  Leeanne Rio, MD Pharmacy:   [id number] [place] [place]Phone: [phone]: [phone] [place] [address]Phone: [phone]: [phone]    Social Determinants of Health (SDOH) Interventions    Readmission Risk Interventions No flowsheet data found.

## 2020-08-19 NOTE — Consult Note (Signed)
[facility]    Date of Admission:  08/12/2020     Reason for Consult: strep mitis bacteremia     Referring Provider: Jennette KettleNeal   Lines:  peripheral  Abx: 4/20-4/25 ceftriaxone          Assessment: Newly dx'ed coagulopathy (factor VIII inhibitor, isolated) Pharyngeal hematoma in setting coagulopathy Anemia with reactive thrombocytosis Strep mitis bacteremia 1 of 2 sets  75 yo female admitted 4/18 for 2-3 days progressive stridor/short of breath, found to have coagulapathy and pharyngeal edema, s/p intubation-extubation, with 1 of 2 set blood cx strep mitis  Patient never had fever. Had stable leukocytosis in setting acute anemia perhaps due to bleeding. 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 Currently on glucocorticoid for this  The strep mitis was after the intubation, suspect related to this/hematoma vs contaminant. Again no sepsis/fever. She has no cardiac device or other hardware. tte poor sensitivity. Agree at this time doesn't need to have tee     Plan: 1. Stop ceftriaxone 2. If sepsis could repeat blood cx 3. Treatment/management of factor VIII inhibitor per oncology team 4. Thank you for involving us with this interesting case. We'll sign off. Please call if further issue    I spent 60 minute reviewing data/chart, and coordinating care and >50% direct face to face time providing counseling/discussing diagnostics/treatment plan with patient   ------------------------------------------------ Active Problems:   Diabetes mellitus, type II (HCC)   Hypertension   Probable Alzheimer's dementia without behavioral disturbance   MRSA bacteremia   Stridor   Compromised airway   Anemia   Coagulopathy (HCC)    HPI: Phebe CollaBetty L Rodda is a 75 y.o. female alzheimer, admitted 4/18 after presenting with progressive 2-3 days stridor short of breath, found to have  coagulopathy/pharyngeal hematoma requiring brief intubation, ultimately dxed with factor 8 inhibitor. ID involved for bcx 1 of 2 set strep mitis   The history taking was difficult due to patient's being demented  Per chart and patient concurred dyspnea. Couldn't give me any detail otherwise.   Patient's initial w/u in ED on 4/18 showed pharyngeal hematoma. ENT was astute to notice sign of coagulopathy and suggested this was hematoma after initial ct read as pharyngeal edema. Confirms with patient no new medication. I reviewed list no ace-I prior to admission  Patient had initial leukocytosis, anemia hb 6 and thrombocytosis. Elevated ptt but normal inr.  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 She has been receiving ceftriaxone (2 days after bcx obtained). Labs trend uremarkable prior to abx and had been stable. Again no fever before abx started or this admission  No cardiac device or hardware present     Family History  Problem Relation Age of Onset  . Heart disease Father   . Hypertension Father   . Hypertension Mother   . Colon cancer Other        aunt    Social History   Tobacco Use  . Smoking status: Former Games developermoker  . Smokeless  tobacco: Never Used  Vaping Use  . Vaping Use: Never used  Substance Use Topics  . Alcohol use: No  . Drug use: No    Allergies  Allergen Reactions  . Metformin And Related Rash  . Chlorthalidone Rash    Per [facility] records, pt may have had a rash rxn to chlorthalidone     Review of Systems: ROS All Other ROS was negative, except mentioned above   Past Medical History:  Diagnosis Date  . Allergic rhinitis   . Diabetes (HCC)   . Diabetes mellitus without complication (HCC)   . Hemorrhoids   . Hyperlipidemia   . Hypertension   . Probable Alzheimer's dementia without behavioral disturbance 09/15/2015   May 2017 [facility] score 8/30   . Seizures (HCC)        Scheduled Meds: . amLODipine  5 mg Oral QHS  . atorvastatin  80 mg Oral QHS  . Chlorhexidine Gluconate Cloth  6 each Topical Q0600  . folic acid  1 mg Oral Daily  . insulin aspart  0-15 Units Subcutaneous TID WC  . insulin aspart  0-5 Units Subcutaneous QHS  . [START ON 08/20/2020] insulin glargine  12 Units Subcutaneous Daily  . mouth rinse  15 mL Mouth Rinse BID  . methylPREDNISolone (SOLU-MEDROL) injection  40 mg Intravenous Q8H  . multivitamin with minerals  1 tablet Oral Daily  . QUEtiapine  25 mg Oral QHS  . senna  1 tablet Oral QHS   Continuous Infusions: . sodium chloride 10 mL/hr at 08/17/20 1600  . cefTRIAXone (ROCEPHIN)  IV 2 g (08/19/20 1004)  . lactated ringers     PRN Meds:.sodium chloride, acetaminophen, docusate sodium, lactated ringers, polyethylene glycol   OBJECTIVE: Blood pressure (!) 162/79, pulse 99, temperature 97.8 F (36.6 C), temperature source Oral, resp. rate 20, height 5\' 6"  (1.676 m), weight 92.6 kg, SpO2 97 %.  Physical Exam Sitting in chair, conversant, no distress, pleasant, cooperative heent normocephalic; per; conj clear; voice normal Neck supple cv rrr no mrg Lungs clear; normal respiratory effort abd s/nt Ext no edema Skin no rash msk no synovitis Neuro nonfocal Psych alert. Oriented to self only   Lab Results Lab Results  Component Value Date   WBC 14.4 (H) 08/19/2020   HGB 7.0 (L) 08/19/2020   HCT 23.2 (L) 08/19/2020   MCV 97.5 08/19/2020   PLT 444 (H) 08/19/2020    Lab Results  Component Value Date   CREATININE 0.54  08/19/2020   BUN 14 08/19/2020   NA 133 (L) 08/19/2020   K 3.9 08/19/2020   CL 103 08/19/2020   CO2 25 08/19/2020    Lab Results  Component Value Date   ALT 15 08/18/2020   AST 20 08/18/2020   ALKPHOS 52 08/18/2020   BILITOT 1.7 (H) 08/18/2020      Microbiology: Recent Results (from the past 240 hour(s))  Resp Panel by RT-PCR (Flu A&B, Covid) Nasopharyngeal Swab     Status: None   Collection Time: 08/12/20 12:56 PM   Specimen: Nasopharyngeal Swab; Nasopharyngeal(NP) swabs in vial transport medium  Result Value Ref Range Status   SARS Coronavirus 2 by RT PCR NEGATIVE NEGATIVE Final    Comment: (NOTE) SARS-CoV-2 target nucleic acids are NOT DETECTED.  The SARS-CoV-2 RNA is generally detectable in upper respiratory specimens during the acute phase of infection. The lowest concentration of SARS-CoV-2 viral copies this assay can detect is 138 copies/mL. A negative result does not preclude SARS-Cov-2 infection and should not  be used as the sole basis for treatment or other patient management decisions. A negative result may occur with  improper specimen collection/handling, submission of specimen other than nasopharyngeal swab, presence of viral mutation(s) within the areas targeted by this assay, and inadequate number of viral copies(<138 copies/mL). A negative result must be combined with clinical observations, patient history, and epidemiological information. The expected result is Negative.  Fact Sheet for Patients:  BloggerCourse.com  Fact Sheet for Healthcare Providers:  SeriousBroker.it  This test is no t yet approved or cleared by the [place] FDA and  has been authorized for detection and/or diagnosis of SARS-CoV-2 by FDA under an Emergency Use Authorization (EUA). This EUA will remain  in effect (meaning this test can be used) for the duration of the COVID-19 declaration under Section 564(b)(1) of the Act,  21 U.S.C.section 360bbb-3(b)(1), unless the authorization is terminated  or revoked sooner.       Influenza A by PCR NEGATIVE NEGATIVE Final   Influenza B by PCR NEGATIVE NEGATIVE Final    Comment: (NOTE) The Xpert Xpress SARS-CoV-2/FLU/RSV plus assay is intended as an aid in the diagnosis of influenza from Nasopharyngeal swab specimens and should not be used as a sole basis for treatment. Nasal washings and aspirates are unacceptable for Xpert Xpress SARS-CoV-2/FLU/RSV testing.  Fact Sheet for Patients: BloggerCourse.com  Fact Sheet for Healthcare Providers: SeriousBroker.it  This test is not yet approved or cleared by the [place] FDA and has been authorized for detection and/or diagnosis of SARS-CoV-2 by FDA under an Emergency Use Authorization (EUA). This EUA will remain in effect (meaning this test can be used) for the duration of the COVID-19 declaration under Section 564(b)(1) of the Act, 21 U.S.C. section 360bbb-3(b)(1), unless the authorization is terminated or revoked.  Performed at [facility], [address] [place], [place] [address]   MRSA PCR Screening     Status: None   Collection Time: 08/12/20  7:58 PM   Specimen: Nasopharyngeal  Result Value Ref Range Status   MRSA by PCR NEGATIVE NEGATIVE Final    Comment:        The GeneXpert MRSA Assay (FDA approved for NASAL specimens only), is one component of a comprehensive MRSA colonization surveillance program. It is not intended to diagnose MRSA infection nor to guide or monitor treatment for MRSA infections. Performed at [facility], [address], [place], [place] [address]   Culture, blood (routine x 2)     Status: Abnormal   Collection Time: 08/12/20  9:09 PM   Specimen: BLOOD  Result Value Ref Range Status   Specimen Description BLOOD SITE NOT SPECIFIED  Final   Special Requests   Final    BOTTLES DRAWN AEROBIC ONLY Blood  Culture results may not be optimal due to an inadequate volume of blood received in culture bottles   Culture  Setup Time   Final    GRAM POSITIVE COCCI IN CHAINS AEROBIC BOTTLE ONLY CRITICAL RESULT CALLED TO, READ BACK BY AND VERIFIED WITH: E MARTIN PHARMD 2111 08/13/20 A BROWNING    Culture (A)  Final    STREPTOCOCCUS MITIS/ORALIS THE SIGNIFICANCE OF ISOLATING THIS ORGANISM FROM A SINGLE SET OF BLOOD CULTURES WHEN MULTIPLE SETS ARE DRAWN IS UNCERTAIN. PLEASE NOTIFY THE MICROBIOLOGY DEPARTMENT WITHIN ONE WEEK IF SPECIATION AND SENSITIVITIES ARE REQUIRED.    Report Status 08/15/2020 FINAL  Final  Blood Culture ID Panel (Reflexed)     Status: Abnormal   Collection Time: 08/12/20  9:09 PM  Result Value Ref Range Status   Enterococcus faecalis NOT DETECTED NOT DETECTED Final   Enterococcus Faecium NOT DETECTED NOT DETECTED Final   Listeria monocytogenes NOT DETECTED NOT DETECTED Final   Staphylococcus species NOT DETECTED NOT DETECTED Final   Staphylococcus aureus (BCID) NOT DETECTED NOT DETECTED Final   Staphylococcus epidermidis NOT DETECTED NOT DETECTED Final   Staphylococcus lugdunensis NOT DETECTED NOT DETECTED Final   Streptococcus species DETECTED (A) NOT DETECTED Final    Comment: Not Enterococcus species, Streptococcus agalactiae, Streptococcus pyogenes, or Streptococcus pneumoniae. CRITICAL RESULT CALLED TO, READ BACK BY AND VERIFIED WITH: E MARTIN PHARMD 2111 08/13/20 A BROWNING    Streptococcus agalactiae NOT DETECTED NOT DETECTED Final   Streptococcus pneumoniae NOT DETECTED NOT DETECTED Final   Streptococcus pyogenes NOT DETECTED NOT DETECTED Final   A.calcoaceticus-baumannii NOT DETECTED NOT DETECTED Final   Bacteroides fragilis NOT DETECTED NOT DETECTED Final   Enterobacterales NOT DETECTED NOT DETECTED Final   Enterobacter cloacae complex NOT DETECTED NOT DETECTED Final   Escherichia coli NOT DETECTED NOT DETECTED Final   Klebsiella aerogenes NOT DETECTED NOT DETECTED  Final   Klebsiella oxytoca NOT DETECTED NOT DETECTED Final   Klebsiella pneumoniae NOT DETECTED NOT DETECTED Final   Proteus species NOT DETECTED NOT DETECTED Final   Salmonella species NOT DETECTED NOT DETECTED Final   Serratia marcescens NOT DETECTED NOT DETECTED Final   Haemophilus influenzae NOT DETECTED NOT DETECTED Final   Neisseria meningitidis NOT DETECTED NOT DETECTED Final   Pseudomonas aeruginosa NOT DETECTED NOT DETECTED Final   Stenotrophomonas maltophilia NOT DETECTED NOT DETECTED Final   Candida albicans NOT DETECTED NOT DETECTED Final   Candida auris NOT DETECTED NOT DETECTED Final   Candida glabrata NOT DETECTED NOT DETECTED Final   Candida krusei NOT DETECTED NOT DETECTED Final   Candida parapsilosis NOT DETECTED NOT DETECTED Final   Candida tropicalis NOT DETECTED NOT DETECTED Final   Cryptococcus neoformans/gattii NOT DETECTED NOT DETECTED Final    Comment: Performed at [facility], [address], [place], [place] [address]  Culture, blood (routine x 2)     Status: None   Collection Time: 08/12/20  9:11 PM   Specimen: BLOOD  Result Value Ref Range Status   Specimen Description BLOOD SITE NOT SPECIFIED  Final   Special Requests   Final    BOTTLES DRAWN AEROBIC ONLY Blood Culture results may not be optimal due to an inadequate volume of blood received in culture bottles   Culture   Final    NO GROWTH 5 DAYS Performed at [facility], [address] [place], [place] [address]    Report Status 08/17/2020 FINAL  Final  Culture, Urine     Status: None   Collection Time: 08/13/20 12:21 PM   Specimen: Urine, Random  Result Value Ref Range Status   Specimen Description URINE, RANDOM  Final   Special Requests NONE  Final   Culture   Final    NO GROWTH Performed at [facility], [address], [place], [place] [address]    Report Status 08/14/2020 FINAL  Final     Serology:    Imaging: If present, new imagings (plain films, ct scans, and  mri) have been personally visualized and interpreted; radiology reports have been reviewed. Decision making incorporated into the Impression / Recommendations.  4/23 tte 1. No obvious vegetations but technically difficult; if clinical  suspicion high, suggest TEE to better assess.  2. Left ventricular  ejection fraction, by estimation, is 60 to 65%. The  left ventricle has normal function. The left ventricle has no regional  wall motion abnormalities. Left ventricular diastolic parameters are  consistent with Grade I diastolic  dysfunction (impaired relaxation). The average left ventricular global  longitudinal strain is -18.6 %. The global longitudinal strain is normal.  3. Right ventricular systolic function is normal. The right ventricular  size is normal. There is normal pulmonary artery systolic pressure.  4. The mitral valve is normal in structure. No evidence of mitral valve  regurgitation. No evidence of mitral stenosis.  5. The aortic valve is tricuspid. Aortic valve regurgitation is not  visualized. Mild aortic valve sclerosis is present, with no evidence of  aortic valve stenosis.  6. The inferior vena cava is normal in size with greater than 50%  respiratory variability, suggesting right atrial pressure of 3 mmHg.    4/20 cxr Low volumes with atelectatic changes in the lung bases.  More patchy opacity in the right lower lung could reflect atelectasis or early airspace disease.  Trace right effusion.   4/18 neck ct contrast Endotracheal tube and NG tube in place. This obscures evaluation of the larynx however there is possible laryngeal edema. Retained secretions in the pharynx.  No mass or adenopathy.  Mild edema in the anterior neck bilaterally. Question allergic Reaction.  After further review with Dr. Elijah Birk, there is mucosal edema in the pharynx. This is circumferential. Fluid in the pharynx which is not high density but could be blood or  secretions. Patient is intubated. There also is mucosal and submucosal edema in the hypopharynx and larynx. Given the history of coagulopathy this may be due to submucosal hemorrhage.   4/18 ct abd/pelv/chest no contrast Limited evaluation due to lack of intravenous contrast administration.  Small mediastinal nodes, indeterminate and possibly reactive.  Mild bilateral lower lobe atelectasis with trace bilateral pleural effusions.  Endotracheal tube terminates 3 cm above the carina. Enteric tube terminates in the distal gastric antrum. Bladder decompressed by indwelling Foley catheter.     Raymondo Band, MD [facility] [phone] pager    08/19/2020, 6:33 PM

## 2020-08-19 NOTE — Interval H&P Note (Signed)
History and Physical Interval Note:  08/19/2020 10:51 AM  Paula Reed  has presented today for surgery, with the diagnosis of bacteremia.  The various methods of treatment have been discussed with the patient and family. After consideration of risks, benefits and other options for treatment, the patient has consented to  Procedure(s): TRANSESOPHAGEAL ECHOCARDIOGRAM (TEE) (N/A) as a surgical intervention.  The patient's history has been reviewed, patient examined, no change in status, stable for surgery.  I have reviewed the patient's chart and labs.  Questions were answered to the patient's satisfaction.     Armanda Magic

## 2020-08-19 NOTE — Progress Notes (Addendum)
IP PROGRESS NOTE  Subjective:   The patient is sitting up in the recliner.  She was scheduled for TEE earlier today and given recent retropharyngeal hematoma resulting in airway compromise and intubation, they do not recommend placing a TEE probe in that area at this time.  The patient's husband is at the bedside.  Also discussed with nursing.  No bleeding reported.  Breathing has been stable.  Objective:  Vital signs in last 24 hours: Temp:  [97.7 F (36.5 C)-98.9 F (37.2 C)] 97.7 F (36.5 C) (04/25 1000) Pulse Rate:  [71-91] 87 (04/25 1000) Resp:  [16-20] 20 (04/25 1000) BP: (118-187)/(53-93) 187/87 (04/25 1000) SpO2:  [98 %-100 %] 98 % (04/25 1000) Weight:  [92.6 kg] 92.6 kg (04/25 0500) Weight change: 2.1 kg Last BM Date: 08/14/20  Intake/Output from previous day: 04/24 0701 - 04/25 0700 In: 50.1 [I.V.:50.1] Out: 800 [Urine:800]  General appearance:  Awake and alert, easily agitated Head: Atraumatic without abnormalities Oropharynx: No thrush or mucositis. Eyes: No scleral icterus. Lymph nodes: No lymphadenopathy noted in the cervical, supraclavicular, or axillary nodes Heart:regular rate and rhythm, without any murmurs or gallops.   Lung: Clear to auscultation without any rhonchi, wheezes or dullness to percussion. Abdomin: Soft, nontender without any shifting dullness or ascites. Musculoskeletal: No clubbing or cyanosis. Neurological: No motor or sensory deficits. Skin: Unchanged with very little ecchymosis noted.   Lab Results: Recent Labs    08/18/20 0502 08/19/20 0226  WBC 10.9* 14.4*  HGB 7.1* 7.0*  HCT 23.2* 23.2*  PLT 447* 444*    BMET Recent Labs    08/18/20 0502 08/19/20 0226  NA 133* 133*  K 3.7 3.9  CL 100 103  CO2 24 25  GLUCOSE 210* 218*  BUN 16 14  CREATININE 0.58 0.54  CALCIUM 9.0 9.0    Studies/Results: ECHOCARDIOGRAM COMPLETE  Result Date: 08/17/2020    ECHOCARDIOGRAM REPORT   Patient Name:   Paula Reed Date of Exam:  08/17/2020 Medical Rec #:  [id number]        Height:       66.0 in Accession #:    [id number]       Weight:       209.7 lb Date of Birth:  [birth year]       BSA:          2.040 m Patient Age:    74 years         BP:           110/50 mmHg Patient Gender: F                HR:           67 bpm. Exam Location:  Inpatient Procedure: 2D Echo, Cardiac Doppler, Color Doppler and Strain Analysis Indications:    Bacteremia R78.81  History:        Patient has prior history of Echocardiogram examinations, most                 recent 06/07/2018. Risk Factors:Former Smoker. Possible laryngeal                 edema, trace bilateral pleural effusions, Streptococcus mitis                 bacteremia, anemia, sinus bradycardia, DM type 2, HTN, HLD,                 Seizures, Dementia.  Sonographer:    Leta Jungling RDCS Referring Phys:  [id number] VINEET SOOD IMPRESSIONS  1. No obvious vegetations but technically difficult; if clinical suspicion high, suggest TEE to better assess.  2. Left ventricular ejection fraction, by estimation, is 60 to 65%. The left ventricle has normal function. The left ventricle has no regional wall motion abnormalities. Left ventricular diastolic parameters are consistent with Grade I diastolic dysfunction (impaired relaxation). The average left ventricular global longitudinal strain is -18.6 %. The global longitudinal strain is normal.  3. Right ventricular systolic function is normal. The right ventricular size is normal. There is normal pulmonary artery systolic pressure.  4. The mitral valve is normal in structure. No evidence of mitral valve regurgitation. No evidence of mitral stenosis.  5. The aortic valve is tricuspid. Aortic valve regurgitation is not visualized. Mild aortic valve sclerosis is present, with no evidence of aortic valve stenosis.  6. The inferior vena cava is normal in size with greater than 50% respiratory variability, suggesting right atrial pressure of 3 mmHg. FINDINGS  Left Ventricle:  Left ventricular ejection fraction, by estimation, is 60 to 65%. The left ventricle has normal function. The left ventricle has no regional wall motion abnormalities. The average left ventricular global longitudinal strain is -18.6 %. The global longitudinal strain is normal. The left ventricular internal cavity size was normal in size. There is no left ventricular hypertrophy. Left ventricular diastolic parameters are consistent with Grade I diastolic dysfunction (impaired relaxation). Right Ventricle: The right ventricular size is normal.Right ventricular systolic function is normal. There is normal pulmonary artery systolic pressure. The tricuspid regurgitant velocity is 2.28 m/s, and with an assumed right atrial pressure of 3 mmHg, the estimated right ventricular systolic pressure is 23.8 mmHg. Left Atrium: Left atrial size was normal in size. Right Atrium: Right atrial size was normal in size. Pericardium: There is no evidence of pericardial effusion. Mitral Valve: The mitral valve is normal in structure. No evidence of mitral valve regurgitation. No evidence of mitral valve stenosis. Tricuspid Valve: The tricuspid valve is normal in structure. Tricuspid valve regurgitation is mild . No evidence of tricuspid stenosis. Aortic Valve: The aortic valve is tricuspid. Aortic valve regurgitation is not visualized. Mild aortic valve sclerosis is present, with no evidence of aortic valve stenosis. Pulmonic Valve: The pulmonic valve was not well visualized. Pulmonic valve regurgitation is not visualized. No evidence of pulmonic stenosis. Aorta: The aortic root is normal in size and structure. Venous: The inferior vena cava is normal in size with greater than 50% respiratory variability, suggesting right atrial pressure of 3 mmHg. IAS/Shunts: No atrial level shunt detected by color flow Doppler. Additional Comments: No obvious vegetations but technically difficult; if clinical suspicion high, suggest TEE to better assess.   LEFT VENTRICLE PLAX 2D LVIDd:         4.60 cm     Diastology LVIDs:         3.30 cm     LV e' medial:    5.66 cm/s LV PW:         0.80 cm     LV E/e' medial:  11.2 LV IVS:        1.00 cm     LV e' lateral:   7.18 cm/s LVOT diam:     1.90 cm     LV E/e' lateral: 8.8 LV SV:         56 LV SV Index:   28          2D Longitudinal Strain LVOT Area:     2.84  cm    2D Strain GLS Avg:     -18.6 %  LV Volumes (MOD) LV vol d, MOD A2C: 63.6 ml LV vol d, MOD A4C: 72.6 ml LV vol s, MOD A2C: 23.4 ml LV vol s, MOD A4C: 29.6 ml LV SV MOD A2C:     40.2 ml LV SV MOD A4C:     72.6 ml LV SV MOD BP:      41.3 ml LEFT ATRIUM           Index LA diam:      3.00 cm 1.47 cm/m LA Vol (A2C): 17.3 ml 8.48 ml/m LA Vol (A4C): 54.3 ml 26.61 ml/m  AORTIC VALVE LVOT Vmax:   90.10 cm/s LVOT Vmean:  63.300 cm/s LVOT VTI:    0.198 m  AORTA Ao Root diam: 3.20 cm MITRAL VALVE               TRICUSPID VALVE MV Area (PHT): 3.03 cm    TR Peak grad:   20.8 mmHg MV Decel Time: 250 msec    TR Vmax:        228.00 cm/s MV E velocity: 63.40 cm/s MV A velocity: 60.40 cm/s  SHUNTS MV E/A ratio:  1.05        Systemic VTI:  0.20 m                            Systemic Diam: 1.90 cm Olga MillersBrian Crenshaw MD Electronically signed by Olga MillersBrian Crenshaw MD Signature Date/Time: 08/17/2020/3:05:12 PM    Final     Medications: I have reviewed the patient's current medications.  Assessment/Plan:  75 year old woman with:  1.  Anemia noted on admission with a hemoglobin of 7.0 in the setting of ecchymosis as well as pharyngeal hematoma.  Work-up for autoimmune hemolytic anemia is negative at this time.  Her Coombs testing is negative with normal LDH and normal bilirubin.  Her elevated indirect bilirubin has improved. She could have a transient hemolysis related to her hematoma but no active hemolysis noted at this time.  For the time being I recommend continued supportive transfusion (transfuse for Hgb <7.0).  Ferritin and iron stores are adequate.  2.  Elevated PTT with  normal PT: Work-up is ongoing for possible inhibitor.  Factor VIII inhibitors are the most common in this particular age group and can present with severe bleeding.  Her PTT is improving which could be spontaneous or may be related to her dexamethasone dosing.  --PTT mixing study does not correct, Factor VIII level is <1%. 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  --if patient were to start to have critical bleeding/drop in Hgb/unstable vitals recommend administering rFVIIa 90 mcg/kg q 2 hours. DO NOT use FVIII supplementation as we do not know the titer of the inhibitor. If rFVIIa is unavailable can use aPCC 50 IU/kg q 8H --in addition to steroid, she received IVIG 1 g/kg x 2 doses on 4/23 and 4/24. --Hematology will continue to monitor   3.  Stridor: The patient has been extubated and is breathing comfortably.  Clenton PareKristin Curcio, DNP, AGPCNP-BC, AOCNP   LOS: 7 days    ADDENDUM: Hematology/Oncology Attending: The patient is seen and examined today.  I agree with the above note.  She is a very pleasant  75 years old African-American female with dementia who was admitted with severe anemia in the setting of ecchymosis as well as pharyngeal hematoma.  Her work-up showed elevated PTT and the patient was found to have factor VIII level of less than 1% with no correction of the mixing studies indicating the possibility of factor VIII inhibitor.  She started treatment with prednisone and she is tolerating it fairly well.  She denied having any bleeding since Friday evening.  We will continue to monitor the patient closely and outlined the recommendation given on previous notes if she has any recurrent bleeding. Dr. Clelia Croft will resume her care starting tomorrow. Please call if you have any concerning  issues.  Disclaimer: This note was dictated with voice recognition software. Similar sounding words can inadvertently be transcribed and may be missed upon review. Lajuana Matte, MD

## 2020-08-20 ENCOUNTER — Ambulatory Visit: Payer: Self-pay | Admitting: Licensed Clinical Social Worker

## 2020-08-20 ENCOUNTER — Other Ambulatory Visit: Payer: Self-pay | Admitting: Oncology

## 2020-08-20 ENCOUNTER — Telehealth: Payer: Self-pay | Admitting: Oncology

## 2020-08-20 DIAGNOSIS — D689 Coagulation defect, unspecified: Secondary | ICD-10-CM

## 2020-08-20 DIAGNOSIS — R7881 Bacteremia: Secondary | ICD-10-CM | POA: Diagnosis not present

## 2020-08-20 DIAGNOSIS — F0280 Dementia in other diseases classified elsewhere without behavioral disturbance: Secondary | ICD-10-CM | POA: Diagnosis not present

## 2020-08-20 DIAGNOSIS — G309 Alzheimer's disease, unspecified: Secondary | ICD-10-CM | POA: Diagnosis not present

## 2020-08-20 DIAGNOSIS — D649 Anemia, unspecified: Secondary | ICD-10-CM | POA: Diagnosis not present

## 2020-08-20 LAB — CBC
HCT: 22.7 % — ABNORMAL LOW (ref 36.0–46.0)
Hemoglobin: 6.9 g/dL — CL (ref 12.0–15.0)
MCH: 29 pg (ref 26.0–34.0)
MCHC: 30.4 g/dL (ref 30.0–36.0)
MCV: 95.4 fL (ref 80.0–100.0)
Platelets: 460 10*3/uL — ABNORMAL HIGH (ref 150–400)
RBC: 2.38 MIL/uL — ABNORMAL LOW (ref 3.87–5.11)
RDW: 20.2 % — ABNORMAL HIGH (ref 11.5–15.5)
WBC: 17.2 10*3/uL — ABNORMAL HIGH (ref 4.0–10.5)
nRBC: 1 % — ABNORMAL HIGH (ref 0.0–0.2)

## 2020-08-20 LAB — GLUCOSE, CAPILLARY
Glucose-Capillary: 268 mg/dL — ABNORMAL HIGH (ref 70–99)
Glucose-Capillary: 310 mg/dL — ABNORMAL HIGH (ref 70–99)
Glucose-Capillary: 214 mg/dL — ABNORMAL HIGH (ref 70–99)

## 2020-08-20 LAB — HEMOGLOBIN AND HEMATOCRIT, BLOOD
HCT: 27.4 % — ABNORMAL LOW (ref 36.0–46.0)
Hemoglobin: 8.8 g/dL — ABNORMAL LOW (ref 12.0–15.0)

## 2020-08-20 LAB — BASIC METABOLIC PANEL
Anion gap: 10 (ref 5–15)
BUN: 23 mg/dL (ref 8–23)
CO2: 24 mmol/L (ref 22–32)
Calcium: 9.4 mg/dL (ref 8.9–10.3)
Chloride: 98 mmol/L (ref 98–111)
Creatinine, Ser: 0.69 mg/dL (ref 0.44–1.00)
GFR, Estimated: >60 mL/min (ref >60)
Glucose, Bld: 173 mg/dL — ABNORMAL HIGH (ref 70–99)
Potassium: 3.9 mmol/L (ref 3.5–5.1)
Sodium: 132 mmol/L — ABNORMAL LOW (ref 135–145)

## 2020-08-20 LAB — PREPARE RBC (CROSSMATCH)

## 2020-08-20 MED ORDER — PREDNISONE 50 MG PO TABS: 50.0000 mg | ORAL_TABLET | Freq: Two times a day (BID) | ORAL | 0 refills | Status: AC

## 2020-08-20 MED ORDER — GLUCERNA SHAKE PO LIQD
237.0000 mL | Freq: Three times a day (TID) | ORAL | Status: DC
  Administered 2020-08-20: 237 mL via ORAL
  Filled 2020-08-20: qty 237

## 2020-08-20 MED ORDER — PREDNISONE 20 MG PO TABS
50.0000 mg | ORAL_TABLET | Freq: Two times a day (BID) | ORAL | Status: DC
  Administered 2020-08-20: 50 mg via ORAL
  Filled 2020-08-20: qty 2

## 2020-08-20 MED ORDER — CEFTRIAXONE IV (FOR PTA / DISCHARGE USE ONLY): 2.0000 g | INTRAVENOUS | 0 refills | Status: DC

## 2020-08-20 MED ORDER — PROSOURCE PLUS PO LIQD
30.0000 mL | Freq: Two times a day (BID) | ORAL | Status: DC
  Administered 2020-08-20: 30 mL via ORAL
  Filled 2020-08-20: qty 30

## 2020-08-20 MED ORDER — INSULIN GLARGINE 100 UNIT/ML ~~LOC~~ SOLN: 15.0000 [IU] | Freq: Every day | SUBCUTANEOUS | Status: DC

## 2020-08-20 MED ORDER — POLYETHYLENE GLYCOL 3350 17 G PO PACK: 17.0000 g | PACK | Freq: Every day | ORAL | 0 refills | Status: AC | PRN

## 2020-08-20 MED ORDER — SODIUM CHLORIDE 0.9% IV SOLUTION: Freq: Once | INTRAVENOUS | Status: DC

## 2020-08-20 NOTE — Progress Notes (Addendum)
Blood consent form signed by patient, witnessed by MD. Jovita Gamma to RN.  ADDENDUM Plan for transfusion with one unit pRBC. Will have nurse order post-H&H. Formal progress note to follow.   Fayette Pho, MD

## 2020-08-20 NOTE — Progress Notes (Signed)
Nutrition Follow-up  DOCUMENTATION CODES:  Obesity unspecified  INTERVENTION:   Glucerna Shake po TID, each supplement provides 220 kcal and 10 grams of protein  64m Prosource Plus po BID, each supplement provides 100 kcals and 15 grams of protein  Continue MVI with minerals daily  NUTRITION DIAGNOSIS:   Inadequate oral intake related to inability to eat as evidenced by NPO status. - progressing, pt on regular diet  GOAL:   Provide needs based on ASPEN/SCCM guidelines -- met  MONITOR:   PO intake,Supplement acceptance,Weight trends,Labs  REASON FOR ASSESSMENT:   Consult,Ventilator Enteral/tube feeding initiation and management  ASSESSMENT:   75year old woman s/p ICU stay and intubation for dysphagia and stridor. PMHx seizures, Alzheimer's dementia, hypertension, hyperlipidemia, T2DM.  4/18 - intubated; OG placed 4/22 - extubated; OG removed 4/23 - diet advanced to full liquids  4/24 - tx FPTS 4/25 - diet advanced to regular  Pt with anemia, likely 2/2 blood loss and hematoma. Pt to have transfusion of one unit pRBC per MD.   Oncology was following pt (has since signed off). Factor VIII inhibitor presented with PTT but did not correct with a mixing study. Pt on solu-medrol and received IVIG. Oncologist recommended switch to oral prednisone (to be tapered outpatient) with additional immunosuppressive agents to be considered. Pt without active bleeding at this time and does not require any factor VIII replacement. Oncology okay with pt discharging.   Pt w/o complaints at this time.   Admit weight: 92.6 kg Current weight: 92.6 kg  UOP: [id number] hours  Medications: folvite, SSI TID w/ meals and at bedtime, 12 units lantus daily, mvi with minerals, deltasone, senokot Labs: Na 132 (L) CBGs [id number] Diet Order:   Diet Order            Diet regular Room service appropriate? Yes; Fluid consistency: Thin  Diet effective now                 EDUCATION  NEEDS:   No education needs have been identified at this time  Skin:  Skin Assessment: Reviewed RN Assessment  Last BM:  08/14/20  Height:   Ht Readings from Last 1 Encounters:  08/12/20 _0  (1.676 m)    Weight:   Wt Readings from Last 1 Encounters:  08/19/20 92.6 kg    Ideal Body Weight:  59 kg  BMI:  Body mass index is 32.95 kg/m.  Estimated Nutritional Needs:   Kcal:  1900-2100  Protein:  100-120 grams  Fluid:  >1.9L/d    AmLarkin InaMS, RD, LDN RD pager number and weekend/on-call pager number located in [place]

## 2020-08-20 NOTE — Progress Notes (Signed)
IP PROGRESS NOTE  Subjective:   Paula Reed reports no complaints at this time.  She denies any bleeding issues including hemoptysis, hematemesis or any new bruises.  She denies any ecchymosis.  She is able to swallow well at this time without any issues.  Objective:  Vital signs in last 24 hours: Temp:  [97.8 F (36.6 C)-98.2 F (36.8 C)] 98.2 F (36.8 C) (04/26 1109) Pulse Rate:  [95-99] 96 (04/26 1109) Resp:  [16-20] 18 (04/26 1109) BP: (131-165)/(54-79) 131/54 (04/26 1109) SpO2:  [96 %-99 %] 97 % (04/26 1109) Weight change:  Last BM Date: 08/14/20  Intake/Output from previous day: 04/25 0701 - 04/26 0700 In: 120 [P.O.:120] Out: 750 [Urine:750]   General appearance: Comfortable appearing without any discomfort Head: Normocephalic without any trauma Oropharynx: Ecchymosis noted on her tongue. Eyes: Pupils are equal and round reactive to light. Lymph nodes: No cervical, supraclavicular, inguinal or axillary lymphadenopathy.   Heart:regular rate and rhythm.  S1 and S2 without leg edema. Lung: Clear without any rhonchi or wheezes.  No dullness to percussion. Abdomin: Soft, nontender, nondistended with good bowel sounds.  No hepatosplenomegaly. Musculoskeletal: No joint deformity or effusion.  Full range of motion noted. Neurological: No deficits noted on motor, sensory and deep tendon reflex exam. Skin: No petechiae or new ecchymosis is noted.      Lab Results: Recent Labs    08/19/20 0226 08/20/20 0222  WBC 14.4* 17.2*  HGB 7.0* 6.9*  HCT 23.2* 22.7*  PLT 444* 460*    BMET Recent Labs    08/19/20 0226 08/20/20 0222  NA 133* 132*  K 3.9 3.9  CL 103 98  CO2 25 24  GLUCOSE 218* 173*  BUN 14 23  CREATININE 0.54 0.69  CALCIUM 9.0 9.4    Studies/Results: No results found.  Medications: I have reviewed the patient's current medications.  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  She is currently on Solu-Medrol and received IVIG.  The natural course of this disease was discussed today with the patient and her husband and treatment options were reviewed.  At this time I recommended switching her to oral prednisone which we will taper as an outpatient basis.  Additional immunosuppressive agents such as cyclophosphamide or rituximab among others will be considered.  No active bleeding is noted and does not require any factor VIII replacement.   2.  Anemia: Likely related to blood loss and hematoma.  I agree with supportive transfusion as needed.  Iron studies on August 17, 2020 were normal.   3.  Stridor: Resolved extubated at this time.  This is likely related to her pharyngeal hematoma.  4. Follow-up.  Disposition: I have no objections to discharge and will arrange follow-up in the next week.   25  minutes were dedicated to this visit.  50% of time was spent face-to-face.  The time was dedicated to reviewing laboratory data, disease status update as well as follow-up instructions to the patient and her husband.    LOS: 8 days   Eli Hose 08/20/2020, 11:18 AM

## 2020-08-20 NOTE — Telephone Encounter (Signed)
Patient is still currently admitted to hospital since 08/12/2020  [facility]

## 2020-08-20 NOTE — Progress Notes (Signed)
Inpatient Diabetes Program Recommendations  AACE/ADA: New Consensus Statement on Inpatient Glycemic Control (2015)  Target Ranges:  Prepandial:   less than 140 mg/dL      Peak postprandial:   less than 180 mg/dL (1-2 hours)      Critically ill patients:  140 - 180 mg/dL   Lab Results  Component Value Date   GLUCAP 268 (H) 08/20/2020   HGBA1C 5.9 (A) 06/25/2020    Review of Glycemic Control Results for [name], [name] (MRN [id number]) as of 08/20/2020 10:16  Ref. Range 08/19/2020 08:03 08/19/2020 11:30 08/19/2020 17:04 08/19/2020 20:47 08/20/2020 07:47  Glucose-Capillary Latest Ref Range: 70 - 99 mg/dL 654 (H) 650 (H) 354 (H) 284 (H) 268 (H)   Current orders for Inpatient glycemic control:  Lantus 12 units (increased today from 10) QD, Novolog 0-15 units TID and 0-5 units QHS, Solumedrol 40 mg Q8H  Inpatient Diabetes Program Recommendations:    Lantus 15 units QHS (home dose)  May need small dose of meal coverage if postprandials are elevated Carb modified diet  Will continue to follow while inpatient.  Thank you, Dulce Sellar, RN, BSN Diabetes Coordinator Inpatient Diabetes Program [phone] (team pager from 8a-5p)

## 2020-08-20 NOTE — Discharge Instructions (Signed)
Dear Paula Reed,  Thank you for letting us participate in your care.  POST-HOSPITAL & CARE INSTRUCTIONS 1. Take your steroid until you see Dr. Teressa Lower in one week 2. Follow up with your primary care doc 3. Go to your follow up appointments (listed below)   DOCTOR'S APPOINTMENT   Future Appointments  Date Time Provider Department Center  08/26/2020 10:00 AM [facility] None  08/26/2020 10:30 AM Benjiman Core, MD [facility] None  08/27/2020 10:30 AM Latrelle Dodrill, MD [facility]    Follow-up Information    Benjiman Core, MD. Go on 08/26/2020.   Specialty: Oncology Why: @10am . This is an appointment with Dr. (blood and cancer specialist). You will have labs at 10am and then see the doctor at 10:30am. This doctor is in charge of your steroid medication (prednisone).  Contact information: [address] [place] [address]        [phone], MD. Go on 08/27/2020.   Specialty: Family Medicine Why: @10 :30am. This is your hospital follow up appointment.  Contact information: [address] [place] [address] [place] [phone]        Care, [facility] Follow up.   Specialty: Home Health Services Why: [phone] will contact you to schedule your first home visit. Contact information: [address] [place] [phone]               Take care and be well!  Family Medicine Teaching Service Inpatient Team [place]  [facility]  [address] [place], [address] [place] [phone]

## 2020-08-20 NOTE — Telephone Encounter (Signed)
Scheduled appts per 4/26 sch msg. Called pt, no answer and no voicemail. Mailed updated calendar to pt.

## 2020-08-20 NOTE — Chronic Care Management (AMB) (Signed)
  Care Management  Collaboration  Note  08/20/2020 Name: Paula Reed MRN: [id number] DOB: 07-02-1945  MARLETTA BOUSQUET is a 75 y.o. year old female who is a primary care patient of Pollie Meyer, Estevan Ryder, MD. The CCM team was consulted reference care coordination needs for Level of Care Concerns.  Assessment: Patient was not interviewed or contacted during this encounter. CCM LCSW collaborated with CCM RN for ongoing needs and inpatient social worker for discharge follow up . Patient will discharge with [facility] and aide.  Per inpatient social work patient's son states he is HCPOA and will bring copies of paperwork to the office. See Care Plan below for interventions and patient self-care actives.   Intervention:Conducted brief assessment, recommendations and relevant information discussed.    Follow up Plan: CCM team will f/u with patient's husband in 7 to 10 days.  Hospital f/u with PCP on May 3rd.    Collaboration with Latrelle Dodrill, MD regarding development and update of comprehensive plan of care as evidenced by provider attestation and co-signature  Review of patient past medical history, allergies, medications, and health status, including review of pertinent consultant reports was performed as part of comprehensive evaluation and provision of care management/care coordination services.   Care Plan Conditions to be addressed/monitored per PCP order: Level of care concerns    Patient Care Plan: Clinical Social Worker  Problem Identified: no advance directive   Goal: Effective Long-Term Care Planning ( Advance directives/ ALF)   Start Date: 07/04/2020  Expected End Date: 09/24/2020  This Visit's Progress: Not on track  Recent Progress: On track  Priority: Medium  Current barriers:   . Patient does not have an advance directive and husband is not ready to move forward at this time . Needs education, support and coordination in order to meet this need. Clinical Goal(s): Over the  next 45 days, the patient will review mailed advance directive and assisted living information    Interventions: . Collaboration with PCP regarding development and update of comprehensive plan of care as evidenced by provider attestation and co-signature. Rene Paci care team collaboration (see longitudinal plan of care) . Assessed understanding of Advance Directives. . A voluntary discussion about advanced care planning including importance of advanced directives, healthcare proxy and living will was discussed with the patient and  husband, Mailed advance directive packet. Patient's husband states he has not received the packet in the mail and does not wish to address this right now;  [name]  Patient's Husband does not want PCS aide would like to explore information for assisted living  Patient Goals/Self-Care Activities : Over the next 60 days . I have mailed you a packet with information about assisted living facilities . I will give you a follow up call to review the information in 2 weeks     Sammuel Hines, LCSW Care Management & Coordination  [facility] / [facility]   [phone] 3:45 PM

## 2020-08-20 NOTE — Progress Notes (Signed)
Family Medicine Teaching Service Daily Progress Note Intern Pager: [phone]  Patient name: Paula Reed Medical record number: [id number] Date of birth: [birth year] Age: 75 y.o. Gender: female  Primary Care Provider: Latrelle Dodrill, MD Consultants: CCM (s/o), ENT (s/o), ID (s/o), Heme Code Status: Full  Pt Overview and Major Events to Date:  4/18-admitted to ICU 4/22-extubated 4/24-transferred FPTS  Assessment and Plan: 75 year old woman s/p ICU stay and intubation for dysphagia and stridor. PMHx seizures, Alzheimer's dementia, hypertension, hyperlipidemia, T2DM.  Acute stridor with acute hypoxic respiratory failure-improved SpO2 >96% on room air. Breathing comfortably.  - continue to monitor  Anemia with elevated PTT and normal PT This morning's Hgb 6.9, transfusion threshold 7. One unit pRBC ordered. Baseline 12-13. S/p 2 days high dose IVIG per heme.  - hematology on board, appreciate ongoing care - switch solumedrol IV to prednisone PO per heme - transfusion threshold 7 - AM CBC  Strep mitis bacteremia: WBC increased from 14.4 to 17.2 today. Patient afebrile last 24 hours. Likely reactive due to steroids. No concern for active infection at this time. S/p 6 days cetriaxone.  - consult ID, believe contaminant, recommend DC abx  Type 2 diabetes: CBGs recently 173-310. Currently on steroids as above.  - lantus increased from 12 to 15 units - moderate SSI with HS coverage  HTN  HLD Hypertensive overnight with systolics 160s, diastolics 70s. This morning, BP improved to systolics 130s.  - continue amlodipine 5, lipitor 80   FEN/GI: Regular  PPx: SCDs for bleed risk   Status is: Inpatient  Remains inpatient appropriate because:likely DC later today after blood administration   Dispo: The patient is from: Home              Anticipated d/c is to: Home              Patient currently is medically stable to d/c.   Difficult to place patient  No   Subjective:  Patient awake laying in bed. Appears comfortable. Answered room phone as we walked in. Continued talking on the phone while team tried to evaluate. She allowed [place] to auscultate heart and lungs while speaking on phone.   Objective: Temp:  [97.8 F (36.6 C)-98.2 F (36.8 C)] 98.2 F (36.8 C) (04/26 1109) Pulse Rate:  [95-99] 96 (04/26 1109) Resp:  [16-20] 18 (04/26 1109) BP: (131-165)/(54-79) 131/54 (04/26 1109) SpO2:  [96 %-99 %] 97 % (04/26 1109) Physical Exam: General: awake, alert, NAD Cardiovascular: RRR no murmur Respiratory: CTAB Abdomen: soft, non tender  Laboratory: Recent Labs  Lab 08/18/20 0502 08/19/20 0226 08/20/20 0222  WBC 10.9* 14.4* 17.2*  HGB 7.1* 7.0* 6.9*  HCT 23.2* 23.2* 22.7*  PLT 447* 444* 460*   Recent Labs  Lab 08/15/20 0559 08/16/20 0529 08/17/20 0532 08/18/20 0502 08/19/20 0226 08/20/20 0222  NA 138 139   < > 133* 133* 132*  K 3.3* 4.0   < > 3.7 3.9 3.9  CL 105 104   < > 100 103 98  CO2 26 28   < > 24 25 24   BUN 19 24*   < > 16 14 23   CREATININE 0.44 0.53   < > 0.58 0.54 0.69  CALCIUM 8.7* 9.0   < > 9.0 9.0 9.4  PROT 6.5 6.5  --  8.3*  --   --   BILITOT 0.9 0.9  --  1.7*  --   --   ALKPHOS 55 55  --  52  --   --  ALT 11 10  --  15  --   --   AST 15 16  --  20  --   --   GLUCOSE 196* 206*   < > 210* 218* 173*   < > = values in this interval not displayed.    Imaging/Diagnostic Tests: None last 24 hours.   Fayette Pho, MD 08/20/2020, 1:21 PM PGY-1, [facility] Family Medicine FPTS Intern pager: [phone], text pages welcome

## 2020-08-20 NOTE — Discharge Summary (Signed)
Paula Reed Discharge Summary  Patient name: Paula Reed Medical record number: [id number] Date of birth: 08/11/1945 Age: 75 y.o. 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 rate 18, height 5' 6"  (1.676 m), weight 92.6 kg, SpO2 97 %. General: awake, alert, NAD Cardiovascular: RRR no murmur Respiratory: CTAB Abdomen: soft, non tender  Brief Reed Course:   Paula Reed is a 75 year old womans/p ICU stay and intubation for acute hypoxic respiratory failure secondary to circumferential laryngeal hematoma. PMHxseizures, Alzheimer's dementia, hypertension, hyperlipidemia, T2DM. Summary timeline below:  4/18-admitted to ICU 4/22-extubated 4/24-transferred FPTS 4/26-discharged from Reed with close PCP and heme-onc follow up  Acute stridor with acute hypoxic respiratory failure secondary to laryngeal hematoma: improved Patient presented to ED with acute stridor and dysphagia, was found ot be in acute hypoxic respiratory failure. ED noted her to have submandibular bruising and other scattered ecchymoses. She was intubated in the ED and admitted directly to the ICU. CT neck/chest/abd/pelvis demonstrated circumferential laryngeal edema. 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 Details below. Patient treated with  steroids and successfully extubated on 4/22. Subsequently transferred ot family medicine teaching service, where she maintained a patent airway without respiratory difficulty. At time of discharge, patient with stable vital signs, normal SpO2 on room air, unlabored respirations, and lungs clear to auscultation. No stridor present.   Anemia with elevated PTT and normal PT: Received total of 3 units of blood during admission  Transfusion threshold of 7. Baseline hemoglobin of 12-13.  While in the ICU hematology was consulted out of concern for autoimmune hemolytic anemia.  Patient was treated with 4 mg Decadron every 6 hours.  She did receive 1 unit packed RBCs on 4/19.  Coombs testing was negative.  Patient did have elevated PTT and a normal PTT with a differential diagnosis including an inhibitor versus lupus anticoagulant per the oncology note.  Per oncology factor VIII inhibitor is most common finding in these settings. Patient received two days of high dose IVIG along with steroids; initially IV solumedrol, converted to PO prednisone prior to discharge. Close follow up arranged with Dr. Alen Reed, med onc, with instructions to take PO prednisone as instructed until time of follow up. Transfusion threshold of 7, hemoglobin reached 6.9 by day of discharge. Patient received one unit of blood, tolerated it well, and had stable post-transfusion hemoglobin of 8.8 prior to discharge.   Issues for Follow Up:  1. Continue oral prednisone until follow up with Med-Onc Paula Reed in about one week 2. Follow up with PCP 3. S/p 3 units blood transfused total over Reed admission. Monitor for signs of symptomatic anemia.  4. Recheck Hgb at PCP  Significant Procedures: Intubation 4/18, extubation 4/22  Significant Labs and Imaging:  Recent Labs  Lab 08/18/20 0502 08/19/20 0226 08/20/20 0222 08/20/20 1654  WBC 10.9* 14.4* 17.2*  --   HGB 7.1* 7.0* 6.9* 8.8*  HCT 23.2* 23.2* 22.7* 27.4*  PLT 447* 444*  460*   --    Recent Labs  Lab 08/16/20 0529 08/17/20 0532 08/18/20 0502 08/19/20 0226 08/20/20 0222  NA 139 140 133* 133* 132*  K 4.0 4.0 3.7 3.9 3.9  CL 104 105 100 103 98  CO2 28 25 24 25 24   GLUCOSE 206* 121* 210* 218* 173*  BUN 24* 18 16 14 23   CREATININE 0.53 0.46 0.58 0.54 0.69  CALCIUM 9.0 9.2 9.0 9.0 9.4  MG 2.0  --   --   --   --   PHOS 3.5  --   --   --   --   ALKPHOS 55  --  52  --   --   AST 16  --  20  --   --   ALT 10  --  15  --   --   ALBUMIN 2.8*  --  2.6*  --   --     PORTABLE CHEST 1 VIEW 08/12/2020 COMPARISON:  Single-view of the chest 05/31/2018. FINDINGS: Lungs clear. Heart size normal. Aortic atherosclerosis. No pneumothorax or pleural fluid. No acute or focal bony abnormality. IMPRESSION: No acute disease.  PORTABLE CHEST 1 VIEW 08/12/2020 COMPARISON:  08/12/2020 at 12:55 hours FINDINGS: Endotracheal tube terminates 2.5 cm above the carina. Enteric tube courses into the mid stomach. Lungs are clear.  No pleural effusion or pneumothorax. The heart is normal in size. IMPRESSION: Endotracheal tube terminates 2.5 cm above the carina. Enteric tube courses into the mid stomach.  CT CHEST, ABDOMEN AND PELVIS WITHOUT CONTRAST 08/12/2020 IMPRESSION: Limited evaluation due to lack of intravenous contrast administration. Small mediastinal nodes, indeterminate and possibly reactive. Mild bilateral lower lobe atelectasis with trace bilateral pleural effusions. Endotracheal tube terminates 3 cm above the carina. Enteric tube terminates in the distal gastric antrum. Bladder decompressed by indwelling Foley catheter.  PORTABLE CHEST 1 VIEW 08/13/2020 COMPARISON:  CT 08/12/2020. FINDINGS: Endotracheal tube and NG tube in stable position. Heart size normal. Atelectasis/infiltrate right lung base. Follow-up exam suggested demonstrate resolution. Small right pleural effusion. No pneumothorax. IMPRESSION: 1.  Endotracheal tube and NG tube in stable  position. 2. Atelectasis/infiltrate right lung base. Small right pleural effusion. Follow-up exams suggested to demonstrate clearing.  CT NECK WITH CONTRAST 08/12/2020 IMPRESSION: Endotracheal tube and NG tube in place. This obscures evaluation of the larynx however there is possible laryngeal edema. Retained secretions in the pharynx. No mass or adenopathy. Mild edema in the anterior neck bilaterally. Question allergic Reaction. ADDENDUM REPORT: 08/14/2020 13:40 ADDENDUM: After further review with Dr. Marcelline Deist, there is mucosal edema in the pharynx. This is circumferential. Fluid in the pharynx which is not high density but could be blood or secretions. Patient is intubated. There also is mucosal and submucosal edema in the hypopharynx and larynx. Given the history of coagulopathy this may be due to submucosal hemorrhage.  PORTABLE CHEST 1 VIEW 08/14/2020 IMPRESSION: Low volumes with atelectatic changes in the lung bases. More patchy opacity in the right lower lung could reflect atelectasis or early airspace disease. Trace right effusion.  Results/Tests Pending at Time of Discharge: PTT Factor Inhibitor Study per med onc  Discharge Medications:  Allergies as of 08/20/2020      Reactions   Metformin And Related Rash   Chlorthalidone Rash   Per [facility] records, pt may have had a rash rxn to chlorthalidone      Medication List    TAKE these medications   acetaminophen 500 MG tablet Commonly known as: TYLENOL Take 1,000 mg by mouth  every 6 (six) hours as needed for headache (pain).   amLODipine 5 MG tablet Commonly known as: NORVASC Take 1 tablet (5 mg total) by mouth at bedtime.   aspirin EC 81 MG tablet Take 81 mg by mouth every morning.   atorvastatin 80 MG tablet Commonly known as: LIPITOR TAKE 1 TABLET BY MOUTH DAILY AT 6 PM. What changed: when to take this   cefTRIAXone  IVPB Commonly known as: ROCEPHIN Inject 2 g into the vein daily for 8 days.  Indication:  Strep mitis bacteremia First Dose: Yes Last Day of Therapy:  08/27/20 Labs - Once weekly:  CBC/D and BMP, Labs - Every other week:  ESR and CRP Method of administration: IV Push Method of administration may be changed at the discretion of home infusion pharmacist based upon assessment of the patient and/or caregiver's ability to self-administer the medication ordered.   glucose blood test strip Commonly known as: OneTouch Verio Check sugar twice daily   insulin glargine 100 UNIT/ML Solostar Pen Commonly known as: LANTUS Inject 15 Units into the skin daily. What changed: when to take this   Insulin Syringe-Needle U-100 31G X 5/16" 0.3 ML Misc Commonly known as: BD Insulin Syringe U/F USE TO INJECT LANTUS DAILY   OneTouch Delica Lancets 96E Misc Check sugar twice daily   OneTouch Verio w/Device Kit 1 kit by Does not apply route daily. Check sugar twice daily   Ozempic (0.25 or 0.5 MG/DOSE) 2 MG/1.5ML Sopn Generic drug: Semaglutide(0.25 or 0.5MG/DOS) Inject 0.25 mg into the skin once a week. (Thursdays) What changed:   when to take this  additional instructions   Pen Needles 32G X 4 MM Misc Inject 1 Dose into the skin as directed.   polyethylene glycol 17 g packet Commonly known as: MIRALAX / GLYCOLAX Take 17 g by mouth daily as needed for moderate constipation.   predniSONE 50 MG tablet Commonly known as: DELTASONE Take 1 tablet (50 mg total) by mouth 2 (two) times daily with breakfast and lunch.   triamcinolone ointment 0.5 % Commonly known as: KENALOG Apply 1 application topically 2 (two) times daily as needed. What changed: reasons to take this       Discharge Instructions: Please refer to Patient Instructions section of EMR for full details.  Patient was counseled important signs and symptoms that should prompt return to medical care, changes in medications, dietary instructions, activity restrictions, and follow up appointments.   Follow-Up  Appointments:  Follow-up Information    Wyatt Portela, MD. Go on 08/26/2020.   Specialty: Oncology Why: @10am . This is an appointment with Dr. Alen Reed (blood and cancer specialist). You will have labs at 10am and then see the doctor at 10:30am. This doctor is in charge of your steroid medication (prednisone).  Contact information: [place] [address] [phone]        Paula Rio, MD. Go on 08/27/2020.   Specialty: Family Medicine Why: @10 :30am. This is your Reed follow up appointment.  Contact information: [place] [address] [address], [facility] Follow up.   Specialty: Home Health Services Why: Alvis Lemmings will contact you to schedule your first home visit. Contact information: [address] [place] [address] [phone]               Ezequiel Essex, MD 08/22/2020, 6:25 PM PGY-1, [place]

## 2020-08-20 NOTE — TOC Transition Note (Addendum)
Transition of Care [facility]) - CM/SW Discharge Note   Patient Details  Name: Paula Reed MRN: [id number] Date of Birth: July 24, 1945  [facility] of [facility]) CM/SW Contact:  Lorri Frederick, LCSW Phone Number: 08/20/2020, 3:20 PM   Clinical Narrative:  Pt discharging home with [facility].  No DME needed.  CSW spoke with pt husband, who will transport pt home.  CSW also spoke with pt son regarding discharge plan and offer from PCP CSW Sammuel Hines to assist with setting up [facility] aide.  Son Marcy Salvo reports that he is POA and that he thinks aid would be very helpful at this point.   CSW spoke with Erin Sons, CSW at PCP office.  She requested son bring the POA paperwork to the office.  She will follow up with family about personal care/HH aide services through medicaid.  CSW spoke with son Marcy Salvo who will bring POA paperwork by the office after work today.        Final next level of care: Home w Home Health Services Barriers to Discharge: Barriers Resolved   Patient Goals and CMS Choice Patient states their goals for this hospitalization and ongoing recovery are:: "I don't know" CMS Medicare.gov Compare Post Acute Care list provided to:: Patient Choice offered to / list presented to : Patient  Discharge Placement                       Discharge Plan and Services In-house Referral: Clinical Social Work   Post Acute Care Choice: Home Health          DME Arranged: N/A         HH Arranged: PT,OT HH Agency: [facility] Date [facility] Agency Contacted: 08/19/20 Time HH Agency Contacted: 1054 Representative spoke with at [facility] Agency: Kandee Keen  Social Determinants of Health (SDOH) Interventions     Readmission Risk Interventions No flowsheet data found.

## 2020-08-20 NOTE — Plan of Care (Signed)
  Problem: Clinical Measurements: Goal: Respiratory complications will improve Outcome: Progressing   Problem: Activity: Goal: Risk for activity intolerance will decrease Outcome: Progressing   Problem: Pain Managment: Goal: General experience of comfort will improve Outcome: Progressing   Problem: Safety: Goal: Ability to remain free from injury will improve Outcome: Progressing   

## 2020-08-21 ENCOUNTER — Telehealth: Payer: Self-pay

## 2020-08-21 LAB — TYPE AND SCREEN
ABO/RH(D): A POS
Antibody Screen: NEGATIVE
Unit division: 0

## 2020-08-21 LAB — BPAM RBC
Blood Product Expiration Date: 202205192359
ISSUE DATE / TIME: 202204261052
Unit Type and Rh: 6200

## 2020-08-21 NOTE — Telephone Encounter (Signed)
Transition Care Management Unsuccessful Follow-up Telephone Call  Date of discharge and from where:  08/20/2020 from [facility]  Attempts:  1st Attempt  Reason for unsuccessful TCM follow-up call:  Unable to leave message

## 2020-08-22 NOTE — Telephone Encounter (Addendum)
Transition Care Management Follow-up Telephone Call  Date of discharge and from where: 08/20/2020 from [facility]  How have you been since you were released from the hospital? Pt stated that she is feeling better. Pt stated that she   Any questions or concerns? No  Items Reviewed:  Did the pt receive and understand the discharge instructions provided? Yes   Medications obtained and verified? Yes   Other? No   Any new allergies since your discharge? No   Dietary orders reviewed? n/a  Do you have support at home? Yes   Functional Questionnaire: (I = Independent and D = Dependent) ADLs: D  Bathing/Dressing- D  Meal Prep- D  Eating- D  Maintaining continence- D  Transferring/Ambulation- D  Managing Meds- D  Follow up appointments reviewed:   PCP Hospital f/u appt confirmed? Yes  Scheduled to see Levert Feinstein, MD on 08/27/2020 @ 10:30am.  Specialist Hospital f/u appt confirmed? Yes  Scheduled to see Eli Hose, MD on 08/26/2020 @ 10:30am.  Are transportation arrangements needed? No   If their condition worsens, is the pt aware to call PCP or go to the Emergency Dept.? Yes  Was the patient provided with contact information for the PCP's office or ED? Yes  Was to pt encouraged to call back with questions or concerns? Yes

## 2020-08-22 NOTE — Telephone Encounter (Signed)
Transition Care Management Unsuccessful Follow-up Telephone Call  Date of discharge and from where:  08/20/2020 from [facility]  Attempts:  2nd Attempt  Reason for unsuccessful TCM follow-up call:  Unable to leave message

## 2020-08-26 ENCOUNTER — Inpatient Hospital Stay: Payer: Medicare Other | Attending: Oncology | Admitting: Oncology

## 2020-08-26 ENCOUNTER — Inpatient Hospital Stay: Payer: Medicare Other

## 2020-08-26 ENCOUNTER — Ambulatory Visit: Payer: Medicare Other | Admitting: Oncology

## 2020-08-26 ENCOUNTER — Other Ambulatory Visit: Payer: Medicare Other

## 2020-08-26 ENCOUNTER — Other Ambulatory Visit: Payer: Self-pay

## 2020-08-26 VITALS — BP 154/57 | HR 80 | Temp 97.8°F | Resp 18 | Ht 66.0 in | Wt 195.7 lb

## 2020-08-26 DIAGNOSIS — D689 Coagulation defect, unspecified: Secondary | ICD-10-CM

## 2020-08-26 DIAGNOSIS — D68318 Other hemorrhagic disorder due to intrinsic circulating anticoagulants, antibodies, or inhibitors: Secondary | ICD-10-CM | POA: Diagnosis not present

## 2020-08-26 LAB — CMP (CANCER CENTER ONLY)
ALT: 15 U/L (ref 0–44)
AST: 13 U/L — ABNORMAL LOW (ref 15–41)
Albumin: 3.2 g/dL — ABNORMAL LOW (ref 3.5–5.0)
Alkaline Phosphatase: 75 U/L (ref 38–126)
Anion gap: 11 (ref 5–15)
BUN: 17 mg/dL (ref 8–23)
CO2: 25 mmol/L (ref 22–32)
Calcium: 9.3 mg/dL (ref 8.9–10.3)
Chloride: 97 mmol/L — ABNORMAL LOW (ref 98–111)
Creatinine: 0.78 mg/dL (ref 0.44–1.00)
GFR, Estimated: >60 mL/min (ref >60)
Glucose, Bld: 441 mg/dL — ABNORMAL HIGH (ref 70–99)
Potassium: 3.6 mmol/L (ref 3.5–5.1)
Sodium: 133 mmol/L — ABNORMAL LOW (ref 135–145)
Total Bilirubin: 2.7 mg/dL — ABNORMAL HIGH (ref 0.3–1.2)
Total Protein: 8.1 g/dL (ref 6.5–8.1)

## 2020-08-26 LAB — CBC WITH DIFFERENTIAL (CANCER CENTER ONLY)
Abs Immature Granulocytes: 0.16 10*3/uL — ABNORMAL HIGH (ref 0.00–0.07)
Basophils Absolute: 0 10*3/uL (ref 0.0–0.1)
Basophils Relative: 0 %
Eosinophils Absolute: 0 10*3/uL (ref 0.0–0.5)
Eosinophils Relative: 0 %
HCT: 24.1 % — ABNORMAL LOW (ref 36.0–46.0)
Hemoglobin: 7.3 g/dL — ABNORMAL LOW (ref 12.0–15.0)
Immature Granulocytes: 1 %
Lymphocytes Relative: 11 %
Lymphs Abs: 2 10*3/uL (ref 0.7–4.0)
MCH: 30.2 pg (ref 26.0–34.0)
MCHC: 30.3 g/dL (ref 30.0–36.0)
MCV: 99.6 fL (ref 80.0–100.0)
Monocytes Absolute: 1.3 10*3/uL — ABNORMAL HIGH (ref 0.1–1.0)
Monocytes Relative: 7 %
Neutro Abs: 15 10*3/uL — ABNORMAL HIGH (ref 1.7–7.7)
Neutrophils Relative %: 81 %
Platelet Count: 380 10*3/uL (ref 150–400)
RBC: 2.42 MIL/uL — ABNORMAL LOW (ref 3.87–5.11)
RDW: 23.1 % — ABNORMAL HIGH (ref 11.5–15.5)
WBC Count: 18.5 10*3/uL — ABNORMAL HIGH (ref 4.0–10.5)
nRBC: 0.5 % — ABNORMAL HIGH (ref 0.0–0.2)

## 2020-08-26 LAB — SAMPLE TO BLOOD BANK

## 2020-08-26 LAB — APTT: aPTT: 49 seconds — ABNORMAL HIGH (ref 24–36)

## 2020-08-26 MED ORDER — PREDNISONE 10 MG PO TABS: ORAL_TABLET | ORAL | 3 refills | Status: AC

## 2020-08-26 NOTE — Progress Notes (Signed)
Hematology and Oncology Follow Up Visit  Paula Reed [id number] 1945/08/19 75 y.o. 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   Prior Therapy:  She is status post Solu-Medrol and IVIG while hospitalized in April 2022.  Current therapy: Prednisone 100 mg daily started on August 20, 2020.  Interim History: Paula Reed returns today for a follow-up visit.  She is a pleasant woman saw in consultation after presenting with coagulopathy with elevated PTT and ecchymosis.  Since her discharge, she reports no major changes in her health.  Most of history is provided by her husband who is the primary caregiver.  She denies any shortness of breath, stridor or any bleeding.  She denies hematochezia, melena or hemoptysis.  She does report ecchymosis on her arms although unclear if they are new ones.     Medications: I have reviewed the patient's current medications.  Current Outpatient Medications  Medication Sig Dispense Refill  . acetaminophen (TYLENOL) 500 MG tablet Take 1,000 mg by mouth every 6 (six) hours as needed for headache (pain).    [name] amLODipine (NORVASC) 5 MG tablet Take 1 tablet (5 mg total) by mouth at bedtime. 90 tablet 3  . aspirin EC 81 MG tablet Take 81 mg by mouth every morning.    [name] atorvastatin (LIPITOR) 80 MG tablet TAKE 1 TABLET BY MOUTH DAILY AT 6 PM. (Patient taking differently: Take 80 mg by mouth at bedtime.) 90 tablet 2  . Blood Glucose Monitoring Suppl (ONETOUCH VERIO) w/Device KIT 1 kit by Does not apply route daily. Check sugar twice daily 1 kit 0  . cefTRIAXone (ROCEPHIN) IVPB Inject 2 g into the vein daily for 8 days. Indication:  Strep mitis bacteremia First Dose: Yes Last Day of Therapy:  08/27/20 Labs - Once weekly:  CBC/D and BMP, Labs - Every other week:  ESR and  CRP Method of administration: IV Push Method of administration may be changed at the discretion of home infusion pharmacist based upon assessment of the patient and/or caregiver's ability to self-administer the medication ordered. 8 Units 0  . glucose blood (ONETOUCH VERIO) test strip Check sugar twice daily 200 each 11  . insulin glargine (LANTUS) 100 UNIT/ML Solostar Pen Inject 15 Units into the skin daily. (Patient taking differently: Inject 15 Units into the skin at bedtime.) 15 mL 11  . Insulin Pen Needle (PEN NEEDLES) 32G X 4 MM MISC Inject 1 Dose into the skin as directed. 200 each 11  . Insulin Syringe-Needle U-100 (BD INSULIN SYRINGE U/F) 31G X 5/16" 0.3 ML MISC USE TO INJECT LANTUS DAILY 100 each 5  . ONETOUCH DELICA LANCETS 35T MISC Check sugar twice daily 200 each 11  . polyethylene glycol (MIRALAX / GLYCOLAX) 17 g packet Take 17 g by mouth daily as needed for moderate constipation. 14 each 0  . predniSONE (DELTASONE) 50 MG tablet Take 1 tablet (50 mg total) by mouth 2 (two) times daily with breakfast and lunch. 60 tablet 0  . Semaglutide,0.25 or 0.5MG/DOS, (OZEMPIC, 0.25 OR 0.5 MG/DOSE,) 2 MG/1.5ML SOPN Inject 0.25 mg into the skin once a week. (Thursdays) (Patient taking differently: Inject 0.25 mg into the skin every Thursday.) 1.5 mL 11  . triamcinolone ointment (KENALOG) 0.5 % Apply 1 application topically 2 (two) times daily as needed. (Patient taking differently: Apply 1 application topically 2 (two) times daily  as needed (itching/rash).) 30 g 2   No current facility-administered medications for this visit.     Allergies:  Allergies  Allergen Reactions  . Metformin And Related Rash  . Chlorthalidone Rash    Per [facility] records, pt may have had a rash rxn to chlorthalidone     Physical Exam: Blood pressure (!) 154/57, pulse 80, temperature 97.8 F (36.6 C), temperature source Tympanic, resp. rate 18, height _0  (1.676 m), weight 195 lb 11.2 oz (88.8 kg), SpO2 100  %.   ECOG: 2   General appearance: Comfortable appearing without any discomfort Head: Normocephalic without any trauma Oropharynx: Mucous membranes are moist and pink without any thrush or ulcers. Eyes: Pupils are equal and round reactive to light. Lymph nodes: No cervical, supraclavicular, inguinal or axillary lymphadenopathy.   Heart:regular rate and rhythm.  S1 and S2 without leg edema. Lung: Clear without any rhonchi or wheezes.  No dullness to percussion. Abdomin: Soft, nontender, nondistended with good bowel sounds.  No hepatosplenomegaly. Musculoskeletal: No joint deformity or effusion.  Full range of motion noted. Neurological: No deficits noted on motor, sensory and deep tendon reflex exam. Skin: Ecchymosis noted bilateral arms left more than right.     Lab Results: Lab Results  Component Value Date   WBC 17.2 (H) 08/20/2020   HGB 8.8 (L) 08/20/2020   HCT 27.4 (L) 08/20/2020   MCV 95.4 08/20/2020   PLT 460 (H) 08/20/2020     Chemistry      Component Value Date/Time   NA 132 (L) 08/20/2020 0222   NA 140 08/15/2019 1341   K 3.9 08/20/2020 0222   CL 98 08/20/2020 0222   CO2 24 08/20/2020 0222   BUN 23 08/20/2020 0222   BUN 12 08/15/2019 1341   CREATININE 0.69 08/20/2020 0222   CREATININE 0.62 09/12/2015 1025   GLU 139 06/04/2016 0000      Component Value Date/Time   CALCIUM 9.4 08/20/2020 0222   ALKPHOS 52 08/18/2020 0502   AST 20 08/18/2020 0502   ALT 15 08/18/2020 0502   BILITOT 1.7 (H) 08/18/2020 0502   BILITOT 0.4 08/15/2019 1341         Impression and Plan:   75 year old woman with:    1.    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  Disease status was updated at this time and treatment options were reviewed.  She is currently on a prednisone 100 mg daily and has received IVIG.  Management options including prednisone alone versus addition of immunosuppressive agent such as  cyclophosphamide, rituximab among others.  At this time, I recommended starting a prednisone taper by 10 mg every week over the next 10 weeks.  This will be escalated quicker if she runs into issues with hyperglycemia.   2.  Anemia:  Related to her bleeding.  Hemoglobin was 8.8 upon discharge, and currently at 7.3.  She is asymptomatic at this time we will hold off any additional transfusion.   3.  Stridor: Due to pharyngeal hematoma appears to be improved.  4. Follow-up.    She will continue to follow every 2 weeks for laboratory measurements and MD follow-up in 4 weeks.   30 minutes were spent on this encounter.  The time was dedicated to reviewing laboratory data, disease status update and outlining future plan of care.  Zola Button, MD 5/2/202210:08 AM

## 2020-08-27 ENCOUNTER — Ambulatory Visit (INDEPENDENT_AMBULATORY_CARE_PROVIDER_SITE_OTHER): Payer: Medicare Other | Admitting: Family Medicine

## 2020-08-27 ENCOUNTER — Other Ambulatory Visit: Payer: Self-pay

## 2020-08-27 ENCOUNTER — Ambulatory Visit: Payer: Medicare Other | Admitting: Licensed Clinical Social Worker

## 2020-08-27 ENCOUNTER — Other Ambulatory Visit: Payer: Self-pay | Admitting: Oncology

## 2020-08-27 ENCOUNTER — Inpatient Hospital Stay (HOSPITAL_COMMUNITY)
Admission: AD | Admit: 2020-08-27 | Discharge: 2020-08-30 | DRG: 641 | Disposition: A | Discharge status: Skilled Nursing Facility | Payer: Medicare Other | Source: Ambulatory Visit | Attending: Family Medicine | Admitting: Family Medicine

## 2020-08-27 VITALS — BP 147/80 | HR 79 | Ht 66.0 in | Wt 194.4 lb

## 2020-08-27 DIAGNOSIS — R4586 Emotional lability: Secondary | ICD-10-CM | POA: Diagnosis present

## 2020-08-27 DIAGNOSIS — Z888 Allergy status to other drugs, medicaments and biological substances status: Secondary | ICD-10-CM

## 2020-08-27 DIAGNOSIS — Z7982 Long term (current) use of aspirin: Secondary | ICD-10-CM

## 2020-08-27 DIAGNOSIS — D68318 Other hemorrhagic disorder due to intrinsic circulating anticoagulants, antibodies, or inhibitors: Secondary | ICD-10-CM

## 2020-08-27 DIAGNOSIS — Z636 Dependent relative needing care at home: Secondary | ICD-10-CM

## 2020-08-27 DIAGNOSIS — E782 Mixed hyperlipidemia: Secondary | ICD-10-CM

## 2020-08-27 DIAGNOSIS — G309 Alzheimer's disease, unspecified: Secondary | ICD-10-CM | POA: Diagnosis present

## 2020-08-27 DIAGNOSIS — Z515 Encounter for palliative care: Secondary | ICD-10-CM

## 2020-08-27 DIAGNOSIS — E1165 Type 2 diabetes mellitus with hyperglycemia: Secondary | ICD-10-CM | POA: Diagnosis present

## 2020-08-27 DIAGNOSIS — R739 Hyperglycemia, unspecified: Secondary | ICD-10-CM | POA: Diagnosis present

## 2020-08-27 DIAGNOSIS — R627 Adult failure to thrive: Secondary | ICD-10-CM

## 2020-08-27 DIAGNOSIS — I10 Essential (primary) hypertension: Secondary | ICD-10-CM

## 2020-08-27 DIAGNOSIS — R569 Unspecified convulsions: Secondary | ICD-10-CM | POA: Diagnosis present

## 2020-08-27 DIAGNOSIS — E785 Hyperlipidemia, unspecified: Secondary | ICD-10-CM | POA: Diagnosis present

## 2020-08-27 DIAGNOSIS — E1149 Type 2 diabetes mellitus with other diabetic neurological complication: Secondary | ICD-10-CM

## 2020-08-27 DIAGNOSIS — E119 Type 2 diabetes mellitus without complications: Secondary | ICD-10-CM

## 2020-08-27 DIAGNOSIS — T380X5A Adverse effect of glucocorticoids and synthetic analogues, initial encounter: Secondary | ICD-10-CM | POA: Diagnosis present

## 2020-08-27 DIAGNOSIS — Z794 Long term (current) use of insulin: Secondary | ICD-10-CM

## 2020-08-27 DIAGNOSIS — Z87891 Personal history of nicotine dependence: Secondary | ICD-10-CM

## 2020-08-27 DIAGNOSIS — Z8673 Personal history of transient ischemic attack (TIA), and cerebral infarction without residual deficits: Secondary | ICD-10-CM

## 2020-08-27 DIAGNOSIS — D649 Anemia, unspecified: Secondary | ICD-10-CM | POA: Diagnosis present

## 2020-08-27 DIAGNOSIS — F0280 Dementia in other diseases classified elsewhere without behavioral disturbance: Secondary | ICD-10-CM | POA: Diagnosis present

## 2020-08-27 DIAGNOSIS — M79642 Pain in left hand: Secondary | ICD-10-CM

## 2020-08-27 DIAGNOSIS — Z7952 Long term (current) use of systemic steroids: Secondary | ICD-10-CM

## 2020-08-27 DIAGNOSIS — Z800 Family history of malignant neoplasm of digestive organs: Secondary | ICD-10-CM

## 2020-08-27 DIAGNOSIS — Z66 Do not resuscitate: Secondary | ICD-10-CM | POA: Diagnosis present

## 2020-08-27 DIAGNOSIS — Z8249 Family history of ischemic heart disease and other diseases of the circulatory system: Secondary | ICD-10-CM

## 2020-08-27 DIAGNOSIS — Z20822 Contact with and (suspected) exposure to covid-19: Secondary | ICD-10-CM | POA: Diagnosis present

## 2020-08-27 DIAGNOSIS — D689 Coagulation defect, unspecified: Secondary | ICD-10-CM | POA: Diagnosis present

## 2020-08-27 LAB — GLUCOSE, CAPILLARY
Glucose-Capillary: 408 mg/dL — ABNORMAL HIGH (ref 70–99)
Glucose-Capillary: 374 mg/dL — ABNORMAL HIGH (ref 70–99)
Glucose-Capillary: 278 mg/dL — ABNORMAL HIGH (ref 70–99)

## 2020-08-27 LAB — SARS CORONAVIRUS 2 (TAT 6-24 HRS): SARS Coronavirus 2: NEGATIVE

## 2020-08-27 MED ORDER — ENOXAPARIN SODIUM 40 MG/0.4ML IJ SOSY
40.0000 mg | PREFILLED_SYRINGE | INTRAMUSCULAR | Status: DC
Start: 2020-08-27 — End: 2020-08-28
  Administered 2020-08-27: 40 mg via SUBCUTANEOUS
  Filled 2020-08-27: qty 0.4

## 2020-08-27 MED ORDER — INSULIN GLARGINE 100 UNIT/ML ~~LOC~~ SOLN
8.0000 [IU] | Freq: Every day | SUBCUTANEOUS | Status: DC
  Administered 2020-08-27 – 2020-08-28 (×2): 8 [IU] via SUBCUTANEOUS
  Filled 2020-08-27 (×3): qty 0.08

## 2020-08-27 MED ORDER — ATORVASTATIN CALCIUM 80 MG PO TABS
80.0000 mg | ORAL_TABLET | Freq: Every day | ORAL | Status: DC
  Administered 2020-08-27 – 2020-08-29 (×3): 80 mg via ORAL
  Filled 2020-08-27 (×3): qty 1

## 2020-08-27 MED ORDER — SEMAGLUTIDE(0.25 OR 0.5MG/DOS) 2 MG/1.5ML ~~LOC~~ SOPN: 0.2500 mg | PEN_INJECTOR | SUBCUTANEOUS | Status: DC

## 2020-08-27 MED ORDER — AMLODIPINE BESYLATE 5 MG PO TABS
5.0000 mg | ORAL_TABLET | Freq: Every day | ORAL | Status: DC
  Administered 2020-08-27 – 2020-08-29 (×3): 5 mg via ORAL
  Filled 2020-08-27 (×3): qty 1

## 2020-08-27 MED ORDER — ONETOUCH DELICA LANCETS 33G MISC: 3 refills | Status: AC

## 2020-08-27 MED ORDER — ONETOUCH DELICA LANCING DEV MISC: 0 refills | Status: AC

## 2020-08-27 MED ORDER — PREDNISONE 50 MG PO TABS
50.0000 mg | ORAL_TABLET | Freq: Two times a day (BID) | ORAL | Status: DC
  Administered 2020-08-28: 50 mg via ORAL
  Filled 2020-08-27: qty 1

## 2020-08-27 MED ORDER — ONETOUCH VERIO VI STRP: ORAL_STRIP | 3 refills | Status: AC

## 2020-08-27 MED ORDER — ONETOUCH VERIO W/DEVICE KIT: PACK | 0 refills | Status: AC

## 2020-08-27 MED ORDER — INSULIN ASPART 100 UNIT/ML IJ SOLN
0.0000 [IU] | Freq: Three times a day (TID) | INTRAMUSCULAR | Status: DC
  Administered 2020-08-27 – 2020-08-28 (×2): 9 [IU] via SUBCUTANEOUS
  Administered 2020-08-28: 3 [IU] via SUBCUTANEOUS
  Administered 2020-08-28: 7 [IU] via SUBCUTANEOUS
  Administered 2020-08-29: 5 [IU] via SUBCUTANEOUS
  Administered 2020-08-29: 9 [IU] via SUBCUTANEOUS
  Administered 2020-08-29: 7 [IU] via SUBCUTANEOUS
  Administered 2020-08-30: 5 [IU] via SUBCUTANEOUS
  Administered 2020-08-30: 9 [IU] via SUBCUTANEOUS
  Administered 2020-08-30: 3 [IU] via SUBCUTANEOUS

## 2020-08-27 MED ORDER — ACETAMINOPHEN 500 MG PO TABS
1000.0000 mg | ORAL_TABLET | Freq: Four times a day (QID) | ORAL | Status: DC | PRN
Start: 2020-08-27 — End: 2020-08-31
  Administered 2020-08-29: 1000 mg via ORAL
  Filled 2020-08-27: qty 2

## 2020-08-27 NOTE — H&P (Addendum)
[facility] Admission History and Physical Service Pager: [phone]  Patient name: Paula Reed Medical record number: [id number] Date of birth: 06-19-1945 Age: 75 y.o. Gender: female  Primary Care Provider: Leeanne Rio, MD Consultants: None Code Status: Full code Preferred Emergency Contact: Son, Kyung Rudd is patients POA: [phone]  Chief Complaint: Failure to Thrive  Assessment and Plan: Paula Reed is a 75 y.o. female presenting with failure to thrive,  . PMH is significant for seizures, alzheimer's dementia, HTN, HLD, T2DM.   Failure to Thrive in the setting of high-dose steroids Previously admitted from 4/18-4/26 for acute hypoxemic respiratory failure 2/2 laryngeal hematoma, discharged with Home Health. Since discharge, patient has been declining and unable to care for herself at home. She was seen at PCP office today for hospital follow up and patient was recommended for direct admission for SNF placement.  -Admit to med-surg, attending Dr. McDiarmid -PT/OT eval and treat -Vitals per floor protocol  -TOC consult for SNF placement -Lovenox 40 mg  Hx Spontaneous Submucosal Hemorrhage  Recent admission last month for acute hypoxic respiratory failure. ENT scope revealed what appeared to be spontaneous submucosal hemorrhage. She ultimately required intubation and short ICU stay. This ultimately led to further coagulopathy workup, additional information below.  -Monitor respiratory status -Continuous pulse ox   Anemia  Factor VIII Inhibitor-Related Bleeding Evaluated by hematology on recent admission due to anemia and elevated PTT. Required 3U PRBC transfusions, two doses of IVIG and started on high-dose steroids (100 mg daily Prednisone) during prior admission. Hgb 7.3 at hematology office yesterday, seen by Dr. Alen Blew, and patient was instructed to taper prednisone by 10 mg/week.  -Morning CBCs  -F/u Prednisone dosing by  pharmacy -Transfusion threshold 7  Steroid-Induced Hyperglycemia  Type 2 DM: chronic, stable Last Hgb A1c 5.9. Home medications include: 15U Lantus and Ozempic 0.32m weekly on Thursdays. Has not been checking sugars at home as she does not have a meter. Glucose at hematology office 441 but also in the setting of high-dose steroids. Seen at PCP office today for hospital follow up and patient and husband seemed overwhelmed with diabetes management in the outpatient setting. Will need close monitoring and adjustment of diabetes inpatient.  Patient has considerable deficits due to dementia and we feel it will be difficult for her to manage her diabetes regimens on her own. -Start Lantus 8U daily -sSSI   -Diabetes educator -Start Semaglutide 0.25 mg  -Lipitor 80 mg -f/u CBG  HTN: chronic, stable BP 140/71 on arrival. Home medications notable for Amlodipine 573mdaily.  Unsure if patient has been taking her blood pressure medications at home. -Continue Amlodipine 5 mg  -Monitor BP   HLD  CVA: chronic, stable On daily ASA, patient reports not taking it at home, and but takes statin at home.  -Continue Lipitor 80 mg   Alzheimer's Dementia -Delirium precautions  Hx Seizures Previously on Keppra, not on any antiepileptics currently. Followed by Neurology, last seen 05/24/19. Last seizure event on 2/19. Was on low dose Aricept 5 mg and was suggested addition of Namenda.  Patient is not taking these medications. -Seizure precautions  FEN/GI: Carb modified diet Prophylaxis: Lovenox  Disposition: med-surg, eventual SNF placement  History of Present Illness:  Paula Reed a [age].o. female presenting with failure to thrive and for SNF placement.  She presented to clinic this afternoon with husband and was suggested for hospital admissions for SNF placement. Patient states that she is in hospital because RN  asked her to be in hospital.  She was tearful and unable to understand her reason for  admission. After explanation, she was calm and able to participate little bit in interview.  Patient denies any pain, any sleep issue or any other complaints.  Of note, patient has history of dementia and states she is in [place] and year is 70, unable to understand her current situation.  She was seen by Dr.McIntyre in clinic this afternoon when she was accompanied by her husband Eduard Clos who helps with the history.  Husband shared that he is overwhelmed and unable to care for patient at home and request her to be admitted to a nursing facility.  As per him since discharge from hospital, patient has been up all hours of night, crying out, eating a lot.  After repetitive explanations, patient was still not able to understand her need to be in hospital.  Review Of Systems: Per HPI with the following additions.  Review of Systems  Unable to perform ROS: Dementia     Patient Active Problem List   Diagnosis Date Noted  . Type 2 diabetes mellitus treated with insulin ([place]) 08/27/2020  . Hyperglycemia 08/27/2020  . Anemia   . Coagulopathy ([place])   . Bacteremia   . Compromised airway   . Hand pain, left 08/13/2020  . Stridor 08/12/2020  . Laryngeal edema   . Pain and swelling of right forearm 06/14/2020  . Pain due to onychomycosis of toenails of both feet 07/26/2019  . History of chronic cough 02/10/2019  . Ischemic cerebrovascular accident (CVA) ([place]) 08/26/2018  . MRSA bacteremia 06/01/2018  . Allergic rhinitis 05/12/2018  . Seizures ([place]) 06/28/2017  . Probable Alzheimer's dementia without behavioral disturbance 09/15/2015  . Rash and nonspecific skin eruption 12/18/2014  . Rash 09/20/2014  . Bruit of right carotid artery 03/26/2014  . Trigger finger 11/03/2012  . Heart murmur 04/22/2012  . Diabetes ([place]) 02/02/2012  . Hypertension 02/02/2012  . Hyperlipidemia 02/02/2012  . Routine adult health maintenance 02/02/2012    Past Medical History: Past Medical History:  Diagnosis  Date  . Allergic rhinitis   . Diabetes ([place])   . Diabetes mellitus without complication ([place])   . Hemorrhoids   . Hyperlipidemia   . Hypertension   . Probable Alzheimer's dementia without behavioral disturbance 09/15/2015   May 2017 [facility] score 8/30   . Seizures ([place])     Past Surgical History: Past Surgical History:  Procedure Laterality Date  . carpel tunnel release    . ECTOPIC PREGNANCY SURGERY    . TEE WITHOUT CARDIOVERSION N/A 06/07/2018   Procedure: TRANSESOPHAGEAL ECHOCARDIOGRAM (TEE);  Surgeon: Lelon Perla, MD;  Location[facility] END[facility]COPY;  Service: Cardiovascular;  Laterality: N/A;    Social History: Social History   Tobacco Use  . Smoking status: Former Research scientist (life sciences)  . Smokeless tobacco: Never Used  Vaping Use  . Vaping Use: Never used  Substance Use Topics  . Alcohol use: No  . Drug use: No    Please also refer to relevant sections of EMR.  Family History: Family History  Problem Relation Age of Onset  . Heart disease Father   . Hypertension Father   . Hypertension Mother   . Colon cancer Other        aunt     Allergies and Medications: Allergies  Allergen Reactions  . Metformin And Related Rash  . Chlorthalidone Rash    Per [facility] records, pt may have had a rash rxn to chlorthalidone  No current facility-administered medications on file prior to encounter.   Current Outpatient Medications on File Prior to Encounter  Medication Sig Dispense Refill  . acetaminophen (TYLENOL) 500 MG tablet Take 1,000 mg by mouth every 6 (six) hours as needed for headache (pain).    [name] amLODipine (NORVASC) 5 MG tablet Take 1 tablet (5 mg total) by mouth at bedtime. 90 tablet 3  . atorvastatin (LIPITOR) 80 MG tablet TAKE 1 TABLET BY MOUTH DAILY AT 6 PM. (Patient taking differently: Take 80 mg by mouth at bedtime.) 90 tablet 2  . Blood Glucose Monitoring Suppl (ONETOUCH VERIO) w/Device KIT Check blood sugar once daily 1 kit 0  . glucose blood (ONETOUCH VERIO) test  strip Check blood sugar once daily 100 strip 3  . insulin glargine (LANTUS) 100 UNIT/ML Solostar Pen Inject 15 Units into the skin daily. (Patient taking differently: Inject 15 Units into the skin at bedtime.) 15 mL 11  . Insulin Pen Needle (PEN NEEDLES) 32G X 4 MM MISC Inject 1 Dose into the skin as directed. 200 each 11  . Insulin Syringe-Needle U-100 (BD INSULIN SYRINGE U/F) 31G X 5/16" 0.3 ML MISC USE TO INJECT LANTUS DAILY 100 each 5  . Lancet Devices (ONE TOUCH DELICA LANCING DEV) MISC Check blood sugar once daily 1 each 0  . OneTouch Delica Lancets 12X MISC Check blood sugar once daily 100 each 3  . polyethylene glycol (MIRALAX / GLYCOLAX) 17 g packet Take 17 g by mouth daily as needed for moderate constipation. 14 each 0  . predniSONE (DELTASONE) 10 MG tablet Take at total of 90 mg daily for a week.  Taper by 10 mg every week per instructions. (Patient taking differently: Take 40 mg by mouth daily. Take at total of 90 mg daily for a week.  Taper by 10 mg every week per instructions.) 140 tablet 3  . predniSONE (DELTASONE) 50 MG tablet Take 1 tablet (50 mg total) by mouth 2 (two) times daily with breakfast and lunch. 60 tablet 0  . Semaglutide,0.25 or 0.5MG/DOS, (OZEMPIC, 0.25 OR 0.5 MG/DOSE,) 2 MG/1.5ML SOPN Inject 0.25 mg into the skin once a week. (Thursdays) (Patient taking differently: Inject 0.25 mg into the skin every Thursday.) 1.5 mL 11  . triamcinolone ointment (KENALOG) 0.5 % Apply 1 application topically 2 (two) times daily as needed. (Patient taking differently: Apply 1 application topically 2 (two) times daily as needed (itching/rash).) 30 g 2    Objective: BP 140/71 (BP Location: Left Arm)   Pulse 86   Temp 99 F (37.2 C) (Oral)   Resp 19   SpO2 100%  Exam: General: Alert, awake, lying comfortably in bed, NAD HEENT: [place]/AT, PERRLA Neck: No nodules palpated Cardiovascular: RRR, no murmur Respiratory: Lungs clear to auscultation bilaterally, normal work of  breathing Gastrointestinal: Soft, nondistended, nontender MSK: Able to move all her extremities Derm: Chronic appearing discoloration in all extremities Neuro: Alert, awake, oriented to place only. Psych: Tearful and anxious affect  Labs and Imaging: CBC BMET  Recent Labs  Lab 08/26/20 0939  WBC 18.5*  HGB 7.3*  HCT 24.1*  PLT 380   Recent Labs  Lab 08/26/20 0939  NA 133*  K 3.6  CL 97*  CO2 25  BUN 17  CREATININE 0.78  GLUCOSE 441*  CALCIUM 9.3      Dagar, Meredith Staggers, MD 08/27/2020, 7:01 PM PGY-1, [facility] pager: [phone], text pages welcome  FPTS Upper-Level Resident Addendum   I have independently  interviewed and examined the patient. I have discussed the above with the original author and agree with their documentation. Please see also any attending notes.   Gifford Shave, MD PGY-2, [facility] 08/27/2020 7:31 PM  FPTS Service pager: [phone] (text pages welcome through [facility])

## 2020-08-27 NOTE — Progress Notes (Signed)
Date of Visit: 08/27/2020   SUBJECTIVE:   HPI:  Paula Reed presents today for hospital follow up. She is accompanied by her husband Billey Gosling who helps provide the history.   She was admitted on 4/18 with a laryngeal hematoma, intubated in the ED, and admitted to the ICU. Hematology consulted and found a factor VIII inhibitor leading to her bleeding. Transfused while inpatient. Given IVIG and high dose steroids, discharged on 100mg  daily of prednisone. Saw hematology for follow up yesterday, at which time Hgb was 7.3 and plan was to taper prednisone by 10mg  per week. Incidentally while admitted patient was also found to have strep mitis bacteremia which was deemed by ID to be a likely contaminant, so antibiotics and further evaluation of bacteremia were stopped.  Diabetes - taking lantus 15u daily and ozempic 0.25mg  weekly on Thursdays. Does not have a meter at home so has not been checking sugars. Glucose yesterday on lab draw at hematology was 441.   Husband shares he is very overwhelmed with her care at home, and requests she be admitted to a nursing facility where they can care for her. Since discharge from the hospital, she has been up all hours of the night, crying out, eating a lot. Patient denies being in pain or having any physical issues with her body. Husband has had trouble getting any sleep or taking care of himself, because of her behaviors.  Husband reports her son (not husband's biological child) is the patient's POA. Son's phone number is [phone].  OBJECTIVE:   BP (!) 147/80   Pulse 79   Ht 5\' 6"  (1.676 m)   Wt 194 lb 6.4 oz (88.2 kg)   SpO2 100%   BMI 31.38 kg/m  Gen: no acute distress, pleasant, cooperative HEENT: normocephalic, atraumatic  Heart: regular rate and rhythm, no murmur Lungs: clear to auscultation bilaterally, normal work of breathing  Psych: tearful throughout encounter though says she is not upset Ext: No appreciable lower extremity edema  bilaterally. Chronic appearing bruise L arm.  ASSESSMENT/PLAN:   75 yo F with history of dementia and type 2 diabetes presenting with failure to thrive at home in setting of high dose steroids to treat factor VIII inhibitor-related bleeding.   Reviewed options with husband of seeking outpatient placement at a long term care facility, and he was agreeable to this, understanding that this would take time and not be an immediate process.  Began to discuss that in the interim we would need to check sugars regularly and adjust insulin closely, due to steroid-related hyperglycemia. At this point husband [phone] got very overwhelmed and shared he did not think he could do this all at home.  I think the option which is in the patient and family's best interest is to directly admit her to the hospital, where we can manage her hyperglycemia and seek placement at a long term care facility. Fortunately patient has both Medicare and Medicaid, which should help with payment for long term care. Husband is agreeable.  Unfortunately no hospital beds were available as of the time of her appointment this morning, so is taking her home to eat some lunch and rest, and will bring her back to the hospital once a bed opens up. The hospital will call [name] directly.  Called son 65 and discussed care plan with him this afternoon after visit, and he is agreeable and had already been informed of plan from [place]. Billey Gosling is very [place]. Copy of POA paperwork is scanned into  patient's chart.   Discussed plan of care with inpatient resident team, as well as attending physicians Dr. McDiarmid and Dr. Deirdre Priest.  Will route note to hematologist Dr. Clelia Croft as an [place]ain Childes as well.  No charge today's visit as will bill for hospital admission later today.  [place] J. Pollie Meyer, MD [facility]

## 2020-08-27 NOTE — Plan of Care (Signed)
  Problem: Coping: Goal: Level of anxiety will decrease Outcome: Not Progressing   

## 2020-08-27 NOTE — Progress Notes (Signed)
Provider contacted and advised just give Lantus for New CBG reading.

## 2020-08-27 NOTE — Patient Instructions (Addendum)
Visit Information  Goals Addressed            This Visit's Progress   . Effective Long-Term Care Planning   On track     Patient Goals/Self-Care Activities :  . I will coordinate with your doctor to assist with long-term care needs . Information e-mailed to son Gerilyn Nestle      No f/u scheduled will check in with patient's son after d/c from hospital.   Sammuel Hines, LCSW Care Management & Coordination  [phone]

## 2020-08-27 NOTE — Progress Notes (Signed)
Contacted provider regarding 408 CBG advised give 9 units and recheck and advise new reading.

## 2020-08-27 NOTE — Patient Instructions (Signed)
You should get a call this afternoon with information on when to go to the hospital.  We will work on placement in a facility that way, and treat her blood sugars.  I sent in a new meter and supplies for her.  Be well, Dr. Pollie Meyer

## 2020-08-27 NOTE — Chronic Care Management (AMB) (Signed)
Chronic Care Management    Clinical Social Work Note  08/27/2020 Name: LORMA HEATER MRN: [id number] DOB: 1945-06-23  SYMPHANI ECKSTROM is a 75 y.o. year old female who is a primary care patient of Ardelia Mems Delorse Limber, MD. The CCM team was consulted to assist the patient with chronic disease management and/or care coordination needs related to: Level of Care Concerns and caregiver support.  Collaboration with patient's son Shonna Chock for follow up visit in response to provider referral for social work chronic care management and care coordination services.   Consent to Services:  The patient was given information about Chronic Care Management services, agreed to services, and gave verbal consent prior to initiation of services.  Please see initial visit note for detailed documentation.   Patient agreed to services and consent obtained.   Assessment: Patient continues to experience difficulty with managing care.Marland KitchenMarland KitchenSon Shonna Chock provided copy of Advance Directive today as he is HPOA.  Reports no one has come to the home for home health since patient was discharged from the hospital.  Son would l  PCS services for patient and also information for LTC placement.  See Care Plan below for interventions and patient self-care actives. Recent life changes [name]: Husband's health also continues to decline and unable to care for patient.   Follow up Plan: No f/u scheduled at this time.  LCSW will collaborate with PCP and patient's son for ongoing needs.   Review of patient past medical history, allergies, medications, and health status, including review of relevant consultants reports was performed today as part of a comprehensive evaluation and provision of chronic care management and care coordination services.     SDOH (Social Determinants of Health) assessments and interventions performed:    Advanced Directives Status: See Vynca application for related entries.  CCM Care Plan  Allergies   Allergen Reactions  . Metformin And Related Rash  . Chlorthalidone Rash    Per [facility] records, pt may have had a rash rxn to chlorthalidone    Outpatient Encounter Medications as of 08/27/2020  Medication Sig  . acetaminophen (TYLENOL) 500 MG tablet Take 1,000 mg by mouth every 6 (six) hours as needed for headache (pain).  [name] amLODipine (NORVASC) 5 MG tablet Take 1 tablet (5 mg total) by mouth at bedtime.  [name] aspirin EC 81 MG tablet Take 81 mg by mouth every morning. (Patient not taking: Reported on 08/27/2020)  . atorvastatin (LIPITOR) 80 MG tablet TAKE 1 TABLET BY MOUTH DAILY AT 6 PM. (Patient taking differently: Take 80 mg by mouth at bedtime.)  . Blood Glucose Monitoring Suppl (ONETOUCH VERIO) w/Device KIT Check blood sugar once daily  . cefTRIAXone (ROCEPHIN) IVPB Inject 2 g into the vein daily for 8 days. Indication:  Strep mitis bacteremia First Dose: Yes Last Day of Therapy:  08/27/20 Labs - Once weekly:  CBC/D and BMP, Labs - Every other week:  ESR and CRP Method of administration: IV Push Method of administration may be changed at the discretion of home infusion pharmacist based upon assessment of the patient and/or caregiver's ability to self-administer the medication ordered. (Patient not taking: Reported on 08/27/2020)  . glucose blood (ONETOUCH VERIO) test strip Check blood sugar once daily  . insulin glargine (LANTUS) 100 UNIT/ML Solostar Pen Inject 15 Units into the skin daily. (Patient taking differently: Inject 15 Units into the skin at bedtime.)  . Insulin Pen Needle (PEN NEEDLES) 32G X 4 MM MISC Inject 1 Dose into the skin as directed.  [name]  Insulin Syringe-Needle U-100 (BD INSULIN SYRINGE U/F) 31G X 5/16" 0.3 ML MISC USE TO INJECT LANTUS DAILY  . Lancet Devices (ONE TOUCH DELICA LANCING DEV) MISC Check blood sugar once daily  . OneTouch Delica Lancets 03O MISC Check blood sugar once daily  . polyethylene glycol (MIRALAX / GLYCOLAX) 17 g packet Take 17 g by mouth daily as  needed for moderate constipation. (Patient not taking: Reported on 08/27/2020)  . predniSONE (DELTASONE) 10 MG tablet Take at total of 90 mg daily for a week.  Taper by 10 mg every week per instructions.  . predniSONE (DELTASONE) 50 MG tablet Take 1 tablet (50 mg total) by mouth 2 (two) times daily with breakfast and lunch.  . Semaglutide,0.25 or 0.5MG/DOS, (OZEMPIC, 0.25 OR 0.5 MG/DOSE,) 2 MG/1.5ML SOPN Inject 0.25 mg into the skin once a week. (Thursdays) (Patient taking differently: Inject 0.25 mg into the skin every Thursday.)  . triamcinolone ointment (KENALOG) 0.5 % Apply 1 application topically 2 (two) times daily as needed. (Patient taking differently: Apply 1 application topically 2 (two) times daily as needed (itching/rash).)   No facility-administered encounter medications on file as of 08/27/2020.    Patient Active Problem List   Diagnosis Date Noted  . Anemia   . Coagulopathy ([place])   . Bacteremia   . Compromised airway   . Hand pain, left 08/13/2020  . Stridor 08/12/2020  . Laryngeal edema   . Pain and swelling of right forearm 06/14/2020  . Pain due to onychomycosis of toenails of both feet 07/26/2019  . History of chronic cough 02/10/2019  . Ischemic cerebrovascular accident (CVA) ([place]) 08/26/2018  . MRSA bacteremia 06/01/2018  . Allergic rhinitis 05/12/2018  . Seizures ([place]) 06/28/2017  . Probable Alzheimer's dementia without behavioral disturbance 09/15/2015  . Rash and nonspecific skin eruption 12/18/2014  . Rash 09/20/2014  . Bruit of right carotid artery 03/26/2014  . Trigger finger 11/03/2012  . Heart murmur 04/22/2012  . Diabetes mellitus, type II ([place]) 02/02/2012  . Hypertension 02/02/2012  . Hyperlipidemia 02/02/2012  . Routine adult health maintenance 02/02/2012    Conditions to be addressed/monitored: Dementia; Level of care concerns  Care Plan : Clinical Social Worker  Updates made by Maurine Cane, LCSW since 08/27/2020 12:00 AM  Problem: plans for  long-term care     Goal: Effective Long-Term Care Planning   Start Date: 07/04/2020  Expected End Date: 09/24/2020  Recent Progress: Not on track  Priority: Medium  Current barriers:   .  Level of care concerns, Memory Deficits, Inability to perform ADL's independently, and Inability to perform IADL's independently Clinical Goals: Patient's son will work with health team to address needs related to long-term care needs for patient. Clinical Interventions:  . Collaboration with Leeanne Rio, MD regarding development and update of comprehensive plan of care as evidenced by provider attestation and co-signature . Inter-disciplinary care team collaboration (see longitudinal plan of care) . Assessment of needs, barriers , agencies contacted, as well as how impacting  . Review various resources, discussed options and provided patient 's son with information about  PCS and facility placement for LTC. . Packet of information e-mailed to son ( [email or web address] ) about facility placement . Will coordinated with PCP about referral for PCS services. . Provided education to patient/caregiver regarding level of care options. . Other interventions provided:Active listening / Reflection utilized , Emotional Supportive Provided, Problem Solving /Task Center , and Caregiver stress acknowledged  Patient Goals/Self-Care Activities:  . I will coordinate with  your doctor to assist with long-term care needs     Casimer Lanius, [place] / [place]   [phone] 3:24 PM

## 2020-08-27 NOTE — Progress Notes (Signed)
Laboratory data from yesterday were reviewed and updated.  Her PTT continues to come down in response to prednisone.  Her blood sugar is expectedly high on high doses of prednisone.  She is following up with Dr. Pollie Meyer this morning.  I recommended adjustment of her diabetes regimen while she is tapering down her prednisone dose.

## 2020-08-28 DIAGNOSIS — R627 Adult failure to thrive: Secondary | ICD-10-CM | POA: Diagnosis present

## 2020-08-28 DIAGNOSIS — R739 Hyperglycemia, unspecified: Secondary | ICD-10-CM | POA: Diagnosis not present

## 2020-08-28 DIAGNOSIS — D649 Anemia, unspecified: Secondary | ICD-10-CM | POA: Diagnosis present

## 2020-08-28 DIAGNOSIS — I10 Essential (primary) hypertension: Secondary | ICD-10-CM | POA: Diagnosis present

## 2020-08-28 DIAGNOSIS — Z20822 Contact with and (suspected) exposure to covid-19: Secondary | ICD-10-CM | POA: Diagnosis present

## 2020-08-28 DIAGNOSIS — T380X5A Adverse effect of glucocorticoids and synthetic analogues, initial encounter: Secondary | ICD-10-CM | POA: Diagnosis present

## 2020-08-28 DIAGNOSIS — Z794 Long term (current) use of insulin: Secondary | ICD-10-CM | POA: Diagnosis not present

## 2020-08-28 DIAGNOSIS — E785 Hyperlipidemia, unspecified: Secondary | ICD-10-CM | POA: Diagnosis present

## 2020-08-28 DIAGNOSIS — F0280 Dementia in other diseases classified elsewhere without behavioral disturbance: Secondary | ICD-10-CM | POA: Diagnosis present

## 2020-08-28 DIAGNOSIS — Z800 Family history of malignant neoplasm of digestive organs: Secondary | ICD-10-CM | POA: Diagnosis not present

## 2020-08-28 DIAGNOSIS — Z8249 Family history of ischemic heart disease and other diseases of the circulatory system: Secondary | ICD-10-CM | POA: Diagnosis not present

## 2020-08-28 DIAGNOSIS — G309 Alzheimer's disease, unspecified: Secondary | ICD-10-CM | POA: Diagnosis present

## 2020-08-28 DIAGNOSIS — Z7982 Long term (current) use of aspirin: Secondary | ICD-10-CM | POA: Diagnosis not present

## 2020-08-28 DIAGNOSIS — E1165 Type 2 diabetes mellitus with hyperglycemia: Secondary | ICD-10-CM | POA: Diagnosis present

## 2020-08-28 DIAGNOSIS — D689 Coagulation defect, unspecified: Secondary | ICD-10-CM | POA: Diagnosis present

## 2020-08-28 DIAGNOSIS — Z7952 Long term (current) use of systemic steroids: Secondary | ICD-10-CM | POA: Diagnosis not present

## 2020-08-28 DIAGNOSIS — R569 Unspecified convulsions: Secondary | ICD-10-CM | POA: Diagnosis present

## 2020-08-28 DIAGNOSIS — Z888 Allergy status to other drugs, medicaments and biological substances status: Secondary | ICD-10-CM | POA: Diagnosis not present

## 2020-08-28 DIAGNOSIS — R4586 Emotional lability: Secondary | ICD-10-CM | POA: Diagnosis present

## 2020-08-28 DIAGNOSIS — Z8673 Personal history of transient ischemic attack (TIA), and cerebral infarction without residual deficits: Secondary | ICD-10-CM | POA: Diagnosis not present

## 2020-08-28 DIAGNOSIS — Z515 Encounter for palliative care: Secondary | ICD-10-CM | POA: Diagnosis not present

## 2020-08-28 DIAGNOSIS — Z66 Do not resuscitate: Secondary | ICD-10-CM | POA: Diagnosis present

## 2020-08-28 DIAGNOSIS — E782 Mixed hyperlipidemia: Secondary | ICD-10-CM | POA: Diagnosis not present

## 2020-08-28 DIAGNOSIS — Z87891 Personal history of nicotine dependence: Secondary | ICD-10-CM | POA: Diagnosis not present

## 2020-08-28 DIAGNOSIS — E1149 Type 2 diabetes mellitus with other diabetic neurological complication: Secondary | ICD-10-CM | POA: Diagnosis not present

## 2020-08-28 LAB — CBC
HCT: 25.5 % — ABNORMAL LOW (ref 36.0–46.0)
Hemoglobin: 7.7 g/dL — ABNORMAL LOW (ref 12.0–15.0)
MCH: 30.6 pg (ref 26.0–34.0)
MCHC: 30.2 g/dL (ref 30.0–36.0)
MCV: 101.2 fL — ABNORMAL HIGH (ref 80.0–100.0)
Platelets: 397 10*3/uL (ref 150–400)
RBC: 2.52 MIL/uL — ABNORMAL LOW (ref 3.87–5.11)
RDW: 23.2 % — ABNORMAL HIGH (ref 11.5–15.5)
WBC: 13.8 10*3/uL — ABNORMAL HIGH (ref 4.0–10.5)
nRBC: 0.4 % — ABNORMAL HIGH (ref 0.0–0.2)

## 2020-08-28 LAB — HEMOGLOBIN A1C
Hgb A1c MFr Bld: 5.4 % (ref 4.8–5.6)
Mean Plasma Glucose: 108.28 mg/dL

## 2020-08-28 LAB — GLUCOSE, CAPILLARY
Glucose-Capillary: 236 mg/dL — ABNORMAL HIGH (ref 70–99)
Glucose-Capillary: 357 mg/dL — ABNORMAL HIGH (ref 70–99)
Glucose-Capillary: 396 mg/dL — ABNORMAL HIGH (ref 70–99)
Glucose-Capillary: 413 mg/dL — ABNORMAL HIGH (ref 70–99)

## 2020-08-28 MED ORDER — INSULIN ASPART 100 UNIT/ML IJ SOLN: 0.0000 [IU] | Freq: Three times a day (TID) | INTRAMUSCULAR | Status: DC

## 2020-08-28 MED ORDER — INSULIN ASPART 100 UNIT/ML IJ SOLN
0.0000 [IU] | Freq: Every day | INTRAMUSCULAR | Status: DC
  Administered 2020-08-28 – 2020-08-29 (×2): 5 [IU] via SUBCUTANEOUS

## 2020-08-28 MED ORDER — PREDNISONE 20 MG PO TABS
40.0000 mg | ORAL_TABLET | ORAL | Status: DC
  Administered 2020-08-28 – 2020-08-30 (×3): 40 mg via ORAL
  Filled 2020-08-28 (×3): qty 2

## 2020-08-28 MED ORDER — PREDNISONE 50 MG PO TABS
50.0000 mg | ORAL_TABLET | Freq: Every day | ORAL | Status: DC
  Administered 2020-08-29 – 2020-08-30 (×2): 50 mg via ORAL
  Filled 2020-08-28 (×2): qty 1

## 2020-08-28 NOTE — Progress Notes (Signed)
Palliative-   Consult received, chart reviewed.  Attempted to arrange inpatient consult with patient's HCPOA- son- Marcy Salvo, and her primary MD Dr. Pollie Meyer per their request.  Unfortunately, Marcy Salvo is not able to attend a meeting tomorrow.  Patient appears to be stable and discharging to SNF soon.  Will refer for outpatient Palliative consult at SNF.   Ocie Bob, AGNP-C Palliative Medicine  No charge

## 2020-08-28 NOTE — Plan of Care (Signed)
  Problem: Activity: Goal: Risk for activity intolerance will decrease Outcome: Progressing   

## 2020-08-28 NOTE — NC FL2 (Signed)
[place] MEDICAID FL2 LEVEL OF CARE SCREENING TOOL     IDENTIFICATION  Patient Name: Paula Reed Birthdate: 07/16/1945 Sex: female Admission Date (Current Location): 08/27/2020  [place] and [place] Number:  Haynes Bast [id number] T Facility and Address:  [facility], [address] [place], [place] [address]      Provider Number: [id number]  Attending Physician Name and Address:  McDiarmid, Leighton Roach, MD  Relative Name and Phone Number:  Mateya Torti - spouse; [phone]    Current Level of Care: Hospital Recommended Level of Care: Skilled Nursing Facility Prior Approval Number:    Date Approved/Denied:   PASRR Number: [id number]  Discharge Plan: SNF    Current Diagnoses: Patient Active Problem List   Diagnosis Date Noted  . Type 2 diabetes mellitus treated with insulin (H[facility]) 08/27/2020  . Hyperglycemia 08/27/2020  . Anemia   . Coagulopathy ([facility])   . Bacteremia   . Compromised airway   . Hand pain, left 08/13/2020  . Stridor 08/12/2020  . Laryngeal edema   . Pain and swelling of right forearm 06/14/2020  . Pain due to onychomycosis of toenails of both feet 07/26/2019  . History of chronic cough 02/10/2019  . Ischemic cerebrovascular accident (CVA) ([facility]) 08/26/2018  . MRSA bacteremia 06/01/2018  . Allergic rhinitis 05/12/2018  . Seizures ([facility]) 06/28/2017  . Probable Alzheimer's dementia without behavioral disturbance 09/15/2015  . Rash and nonspecific skin eruption 12/18/2014  . Rash 09/20/2014  . Bruit of right carotid artery 03/26/2014  . Trigger finger 11/03/2012  . Heart murmur 04/22/2012  . Diabetes ([facility]CC) 02/02/2012  . Hypertension 02/02/2012  . Hyperlipidemia 02/02/2012  . Routine adult health maintenance 02/02/2012    Orientation RESPIRATION BLADDER Height & Weight     Self  Normal External catheter Weight:   Height:     BEHAVIORAL SYMPTOMS/MOOD NEUROLOGICAL BOWEL NUTRITION STATUS    Convulsions/Seizures (History  of seizures) Continent Diet (Carb modified)  AMBULATORY STATUS COMMUNICATION OF NEEDS Skin   Limited Assist Verbally Other (Comment) (Ecchymosis left hand)                       Personal Care Assistance Level of Assistance  Bathing,Feeding,Dressing Bathing Assistance: Limited assistance Feeding assistance: Limited assistance (assistance with set-up) Dressing Assistance: Limited assistance     Functional Limitations Info  Sight,Hearing,Speech Sight Info: Impaired (Wears glasses) Hearing Info: Impaired Speech Info: Adequate    SPECIAL CARE FACTORS FREQUENCY  PT (By licensed PT)     PT Frequency: Evaluated 5/4              Contractures Contractures Info: Not present    Additional Factors Info  Code Status,Allergies,Insulin Sliding Scale Code Status Info: Full Allergies Info: Metformin and related, Chlorthalidone   Insulin Sliding Scale Info: 0-9 Units 3 times per day with meals       Current Medications (08/28/2020):  This is the current hospital active medication list Current Facility-Administered Medications  Medication Dose Route Frequency Provider Last Rate Last Admin  . acetaminophen (TYLENOL) tablet 1,000 mg  1,000 mg Oral Q6H PRN Dagar, Geralynn Rile, MD      . amLODipine (NORVASC) tablet 5 mg  5 mg Oral QHS Dagar, Anjali, MD   5 mg at 08/27/20 2158  . atorvastatin (LIPITOR) tablet 80 mg  80 mg Oral QHS Dagar, Geralynn Rile, MD   80 mg at 08/27/20 2158  . enoxaparin (LOVENOX) injection 40 mg  40 mg Subcutaneous Q24H Dagar, Geralynn Rile, MD  40 mg at 08/27/20 2043  . insulin aspart (novoLOG) injection 0-9 Units  0-9 Units Subcutaneous TID WC Dagar, Geralynn Rile, MD   7 Units at 08/28/20 1203  . insulin glargine (LANTUS) injection 8 Units  8 Units Subcutaneous Daily Dagar, Geralynn Rile, MD   8 Units at 08/28/20 0940  . predniSONE (DELTASONE) tablet 40 mg  40 mg Oral Q24H Derrel Nip, MD   40 mg at 08/28/20 1202  . [START ON 08/29/2020] predniSONE (DELTASONE) tablet 50 mg  50 mg Oral Q  breakfast Derrel Nip, MD      . Melene Muller ON 08/29/2020] Semaglutide(0.25 or 0.5MG /DOS) SOPN 0.25 mg  0.25 mg Subcutaneous Q Thu Dagar, Geralynn Rile, MD         Discharge Medications: Please see discharge summary for a list of discharge medications.  Relevant Imaging Results:  Relevant Lab Results:   Additional Information ss#[id number]  Cristobal Goldmann, LCSW

## 2020-08-28 NOTE — Progress Notes (Signed)
Inpatient Diabetes Program Recommendations  AACE/ADA: New Consensus Statement on Inpatient Glycemic Control (2015)  Target Ranges:  Prepandial:   less than 140 mg/dL      Peak postprandial:   less than 180 mg/dL (1-2 hours)      Critically ill patients:  140 - 180 mg/dL   Lab Results  Component Value Date   GLUCAP 236 (H) 08/28/2020   HGBA1C 5.4 08/28/2020    Review of Glycemic Control Results for Paula Reed, Paula Reed (MRN [id number]) as of 08/28/2020 09:43  Ref. Range 08/27/2020 18:18 08/27/2020 19:24 08/27/2020 21:39 08/28/2020 05:58  Glucose-Capillary Latest Ref Range: 70 - 99 mg/dL 957 (H) 473 (H) 403 (H) 236 (H)   Diabetes history: DM 2 Outpatient Diabetes medications: Lantus 15 units, Ozempic 0.25 mg QThursday Current orders for Inpatient glycemic control:  Lantus 8 units Novolog 0-9 units tid  PO prednisone 50 mg bid  Inpatient Diabetes Program Recommendations:    Note pt on steroids  -  Increase Lantus to home dose 15 units -  Add Novolog hs scale   Thanks,  Christena Deem RN, MSN, BC-ADM Inpatient Diabetes Coordinator Team Pager [phone] (8a-5p)

## 2020-08-28 NOTE — Evaluation (Signed)
Physical Therapy Evaluation Patient Details Name: Paula Reed MRN: [id number] DOB: 10/24/1945 Today's Date: 08/28/2020   History of Present Illness  75 y.o. female presenting with failure to thrive after previous admission from /18-4/26 for acute hypoxemic respiratory failure 2/2 laryngeal hematoma and spontaneous submucosal hemorrhage requiring intubation and short ICU stay discharged with Home Health.  PMH is significant for seizures, alzheimer's dementia, HTN, HLD, T2DM.   Clinical Impression   Pt only oriented to self at time of Evaluation so home set up and PLOF collected from prior documentation. Pt living with spouse, who provides 24 hour care. Pt was ambulating with and without DME with decreased safety awareness, family providing for iADLs. Pt is currently limited in safe mobility by decreased cognition, in particular safe use of DME in presence of generalized weakness. Pt is currently close to baseline level mobility requiring increased multimodal cuing for safety with DME. Pt is currently supervision for bed mobility, min guard for transfers to standing and pivot to [facility] and contact guard assist for safety with ambulation using RW. Pt would benefit from short term PT at discharge location to improve safety with mobility. PT will continue to follow acutely.       Follow Up Recommendations Supervision/Assistance - 24 hour;Other (comment) (Pt would benefit from short term PT for safety with DME at discharge location.)    Equipment Recommendations  None recommended by PT       Precautions / Restrictions Precautions Precautions: Fall Restrictions Weight Bearing Restrictions: No      Mobility  Bed Mobility Overal bed mobility: Needs Assistance Bed Mobility: Supine to Sit     Supine to sit: Supervision     General bed mobility comments: supervision to come to EoB    Transfers Overall transfer level: Needs assistance Equipment used: Rolling walker (2  wheeled) Transfers: Sit to/from [employer] Sit to Stand: Min guard Stand pivot transfers: Min guard       General transfer comment: pt with urinary urgency with coming to EoB, required increased cuing for remaining seated until [facility] presented,min guard for sit>stand and pivot to BSC, vc for sequencing  Ambulation/Gait Ambulation/Gait assistance: Min assist Gait Distance (Feet): 12 Feet Assistive device: Rolling walker (2 wheeled) Gait Pattern/deviations: Step-through pattern;Decreased step length - right;Decreased step length - left Gait velocity: decreased Gait velocity interpretation: <1.31 ft/sec, indicative of household ambulator General Gait Details: contact guard assist for safety with ambulation, increased multimodal cuing for proper RW usage, pt with tendancy to let go of RW during ambulation        Balance Overall balance assessment: Needs assistance Sitting-balance support: Bilateral upper extremity supported;No upper extremity supported;Single extremity supported Sitting balance-Leahy Scale: Fair     Standing balance support: Single extremity supported;Bilateral upper extremity supported;No upper extremity supported;During functional activity Standing balance-Leahy Scale: Fair                               Pertinent Vitals/Pain Pain Assessment: Faces Faces Pain Scale: Hurts a little bit Pain Location: hand and stomach Pain Descriptors / Indicators: Grimacing;Guarding;Moaning Pain Intervention(s): Monitored during session;Repositioned;Limited activity within patient's tolerance    Home Living Family/patient expects to be discharged to:: Skilled nursing facility Living Arrangements: Spouse/significant other Available Help at Discharge: Family;Available 24 hours/day Type of Home: House         Home Equipment: Walker - 2 wheels;Cane - single point Additional Comments: per prior hospitalization, husband available 24/7  Prior  Function Level of Independence: Needs assistance   Gait / Transfers Assistance Needed: Husband reports pt ambulatory without DME, does not use SPC or RW but has used before  ADL's / Homemaking Assistance Needed: Husband reports pt typically able to perform these tasks herself, but states, "We've had to help before"  Comments: Pt is A&Ox1 and unreliable historian, home set up collected from prior hospitalization     Hand Dominance   Dominant Hand: Right    Extremity/Trunk Assessment   Upper Extremity Assessment Upper Extremity Assessment: Generalized weakness    Lower Extremity Assessment Lower Extremity Assessment: Generalized weakness    Cervical / Trunk Assessment Cervical / Trunk Assessment: Normal  Communication   Communication: No difficulties  Cognition Arousal/Alertness: Awake/alert Behavior During Therapy: WFL for tasks assessed/performed Overall Cognitive Status: History of cognitive impairments - at baseline Area of Impairment: Orientation;Attention;Memory;Following commands;Safety/judgement;Awareness;Problem solving                 Orientation Level: Disoriented to;Place;Time;Situation Current Attention Level: Sustained Memory: Decreased short-term memory Following Commands: Follows one step commands inconsistently Safety/Judgement: Decreased awareness of safety;Decreased awareness of deficits Awareness: Intellectual Problem Solving: Requires verbal cues;Requires tactile cues General Comments: pt repeatedly asking to go home and sleep in her own bed, very poor safety awareness especially with DME usage      General Comments General comments (skin integrity, edema, etc.): VSS on RA,        Assessment/Plan    PT Assessment Patient needs continued PT services  PT Problem List Decreased cognition;Decreased safety awareness;Decreased knowledge of use of DME       PT Treatment Interventions DME instruction;Gait training;Stair training;Functional  mobility training;Therapeutic activities;Therapeutic exercise;Balance training;Cognitive remediation;Patient/family education    PT Goals (Current goals can be found in the Care Plan section)  Acute Rehab PT Goals Patient Stated Goal: go home PT Goal Formulation: Patient unable to participate in goal setting Time For Goal Achievement: 09/11/20 Potential to Achieve Goals: Fair    Frequency Min 3X/week    AM-PAC PT "6 Clicks" Mobility  Outcome Measure Help needed turning from your back to your side while in a flat bed without using bedrails?: None Help needed moving from lying on your back to sitting on the side of a flat bed without using bedrails?: None Help needed moving to and from a bed to a chair (including a wheelchair)?: A Little Help needed standing up from a chair using your arms (e.g., wheelchair or bedside chair)?: A Little Help needed to walk in hospital room?: A Little Help needed climbing 3-5 steps with a railing? : A Lot 6 Click Score: 19    End of Session Equipment Utilized During Treatment: Gait belt Activity Tolerance: Patient tolerated treatment well Patient left: in chair;with call bell/phone within reach;with chair alarm set Nurse Communication: Mobility status PT Visit Diagnosis: Other abnormalities of gait and mobility (R26.89)    Time: 6301-6010 PT Time Calculation (min) (ACUTE ONLY): 32 min   Charges:   PT Evaluation $PT Eval Moderate Complexity: 1 Mod PT Treatments $Therapeutic Activity: 8-22 mins        Anamari Galeas B. Beverely Risen PT, DPT Acute Rehabilitation Services Pager [phone] Office [phone]   Elon Alas Fleet 08/28/2020, 1:15 PM

## 2020-08-28 NOTE — Progress Notes (Signed)
Pharmacy was asked to clarify patient's home steroid taper. Patient was discharged on 08/20/20 after admission for respiratory failure and newly diagnosed factor VIII inhibitor. Discharge prednisone dose was 50mg  twice daily at breakfast and lunch.  Patient saw Oncologist Dr. on 08/26/20 who recommended starting a prednisone taper by 10 mg every week over the next 10 weeks.  This will be escalated quicker if she runs into issues with hyperglycemia.  Suggested Steroid Taper: Decrease by 10mg  per week for 10 weeks every Tuesday  08/20/20 to 5/2: Take 50mg  twice daily at breakfast and lunch 5/3 to 5/9: Take 50mg  at breakfast and 40mg  at lunch 5/10 to 5/16: Take 40mg  at breakfast and 40mg  at lunch 5/17 to 5/23: Take 40mg  at breakfast and 30mg  at lunch 5/24 to 5/30: Take 30mg  at breakfast and 30mg  at lunch 5/31 to 6/6: Take 30mg  at breakfast and 20mg  at lunch 6/7 to 6/13: Take 20mg  at breakfast and 20mg  at lunch 6/14 to 6/20: Take 20mg  at breakfast and 10mg  at lunch 6/21 to 6/27: Take 10mg  at breakfast and 10mg  at lunch 6/28 to 7/4: Take 10mg  at breakfast only 7/5: Stop prednisone     , PharmD, BCPS, [facility] Clinical Pharmacist  Please check AMION for all [facility] Pharmacy phone numbers After 10:00 PM, call Main Pharmacy [phone]

## 2020-08-28 NOTE — Progress Notes (Addendum)
Family Medicine Teaching Service Daily Progress Note Intern Pager: [phone]  Patient name: Paula Reed Medical record number: [id number] Date of birth: [birth year] Age: 75 y.o. Gender: female  Primary Care Provider: Latrelle Dodrill, MD Consultants: None Code Status: Full   Pt Overview and Major Events to Date:  5/4- admitted for Failure to Thrive  Assessment and Plan: Paula Reed is a 75 y.o. female presenting with failure to thrive. PMH is significant for seizures, alzheimer's dementia, HTN, HLD, T2DM.   Failure to Thrive in the setting of high-dose steroids Patient admitted for failure to thrive after recent discharge on high dose Prednisone. Affect is tearful and changed from baseline and likely a side effect from steriods. Patient is unable to complete ADLs and husband  is overwhelmed with her care and requests she be admitted to a nursing facility. 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 Patient on high-dose steroid taper for Factor VIII inhibitor. Currently taking 90mg .Patient saw Oncologist Dr. on 08/26/20 who recommended starting a prednisone taper by 10 mg every week over the next 10 weeks Verified by Pharmacy to taper by 10mg  every Tuesday. We will continue to monitor. -Prednisone 50mg  at breakfast and 40mg  at lunch -Morning CBCs  -Transfusion threshold 7   Type 2 DM: chronic, stable CBG this morning 236, received 3 units of Aspart. Hgb A1c 5.4 . Home medications include: 15U Lantus and Ozempic 0.25mg  weekly on Thursdays. Patient's family will need to bring in Ozempic tomorrow for administration. Given glucocorticoids, we will need to monitor for hyperglycemia.  -continue  Lantus 8U daily -sSSI   -Diabetes educator following -start Semaglutide 0.25 mg  -CBG monitoring  HTN: chronic, stable BP was  135/52 this morning with an isolated pressure of 160/72  Home medications notable for Amlodipine 5mg  daily.  Unsure if patient has been taking her blood pressure medications at home. -Continue Amlodipine 5 mg  -Monitor BP   HLD  CVA: chronic, stable Home medications includes Atorvastatin 80mg   -Continue Lipitor 80 mg   Alzheimer's Dementia Goals of Care Palliative Care Patient has a history of AD.Patient is oriented to person only. We spoke with her son, Sunday, who is POA expressed change of code status to DNR. Given her dementia, we also suggest a Goals of Care discussion for future management decisions, and he was agreeable. -Palliative Care consult -Change code status to DNR -Delirium precautions  Hx Seizures Followed by Neurology, last seen 05/24/19. Not on any antiepileptics currently. Last seizure event on 2/19. -Seizure precautions   Hx Spontaneous Submucosal Hemorrhage,stable Recent admission last month for acute hypoxic respiratory failure. ENT scope revealed what appeared to be spontaneous submucosal hemorrhage. She ultimately required intubation and short ICU stay. -Monitor respiratory status -Continuous pulse ox  FEN/GI: Carb modified diet PPx: Lovenox 40mg   Status is: Observation  The patient will require care spanning > 2 midnights and should be moved to inpatient because: Unsafe d/c plan  Dispo: The patient is from: Home              Anticipated d/c is to: SNF              Patient currently is not medically stable to d/c.   Difficult to place patient Yes  Subjective:  Mrs. Paula Reed is tearful this morning, but reports that she is "okay". She denies  any chest pain, SOB or pain/discomfort anywhere. She is not oriented to place or time.   Objective: Temp:  [98.1 F (36.7 C)-99 F (37.2 C)] 98.1 F (36.7 C) (05/04 0539) Pulse Rate:  [67-86] 78 (05/04 0539) Resp:  [16-19] 16 (05/04 0539) BP: (137-161)/(50-80) 160/72 (05/04 0539) SpO2:  [98 %-100 %] 98 %  (05/04 0539) Weight:  [88.2 kg] 88.2 kg (05/03 0953) Physical Exam: General: Sitting up in bed, eating breakfast. Able to answer questions Cardiovascular: Regular rate and rhythm.  Respiratory: Normal WOB, lungs clear bilaterally Psych: Oriented to person only. Tearful affect  Laboratory: Recent Labs  Lab 08/26/20 0939 08/28/20 0716  WBC 18.5* 13.8*  HGB 7.3* 7.7*  HCT 24.1* 25.5*  PLT 380 397   Recent Labs  Lab 08/26/20 0939  NA 133*  K 3.6  CL 97*  CO2 25  BUN 17  CREATININE 0.78  CALCIUM 9.3  PROT 8.1  BILITOT 2.7*  ALKPHOS 75  ALT 15  AST 13*  GLUCOSE 441*   Imaging/Diagnostic Tests:  Katherine Mantle, Medical Student 08/28/2020, 8:04 AM MS4, [facility] FPTS Intern pager: [phone], text pages welcome  Resident Attestation  I saw and evaluated the patient, performing the key elements of the service.I  personally performed or re-performed the history, physical exam, and medical decision making activities of this service and have verified that the service and findings are accurately documented in the student's note. I developed the management plan that is described in the medical student's note, and I agree with the content, with my edits above.    Derrel Nip, PGY2

## 2020-08-28 NOTE — Progress Notes (Signed)
Patient's CBG=413,no HS coverage ordered. MD,Sun text paged. Order received. Will continue to monitor.

## 2020-08-28 NOTE — Evaluation (Signed)
Occupational Therapy Evaluation Patient Details Name: KORALINE PHILLIPSON MRN: [id number] DOB: 11/10/1945 Today's Date: 08/28/2020    History of Present Illness 75 y.o. female presenting with failure to thrive after previous admission from 4/18-4/26 for acute hypoxemic respiratory failure 2/2 laryngeal hematoma and spontaneous submucosal hemorrhage requiring intubation and short ICU stay discharged with Home Health.  PMH is significant for seizures, alzheimer's dementia, HTN, HLD, T2DM.   Clinical Impression   Pt presents with decline in function and safety with ADLs and ADL mobility. Pt A & O to self ad a poor historian stating that she lives with her mother ad father; home set up and PLOF collected from prior documentation. PTA, pt living with spouse, who provides 24 hour care but pt using RW for mobility ad mostly Ind with basic selfcare and family providing for iADLs. Pt is currently limited in safe mobility by decreased cognition. Pt is possibly close to baseline with ADLs and and mobility. Pt would benefit from acute OT services to address impairments to maximize level of function ad safety    Follow Up Recommendations  Supervision/Assistance - 24 hour (family seeking LTC vs ALF?)   Equipment Recommendations  None recommended by OT    Recommendations for Other Services       Precautions / Restrictions Precautions Precautions: Fall Restrictions Weight Bearing Restrictions: No      Mobility Bed Mobility Overal bed mobility: Needs Assistance Bed Mobility: Supine to Sit     Supine to sit: Supervision     General bed mobility comments: pt in recliner upon arrival    Transfers Overall transfer level: Needs assistance Equipment used: Rolling walker (2 wheeled) Transfers: Sit to/from [employer] Sit to Stand: Min guard Stand pivot transfers: Min guard       General transfer comment: pt with urinary urgency with coming to EoB, required increased cuing for  remaining seated until [facility] presented,min guard for sit>stand and pivot to BSC, vc for sequencing    Balance Overall balance assessment: Needs assistance Sitting-balance support: Bilateral upper extremity supported;No upper extremity supported;Single extremity supported Sitting balance-Leahy Scale: Fair     Standing balance support: Single extremity supported;Bilateral upper extremity supported;No upper extremity supported;During functional activity Standing balance-Leahy Scale: Fair Standing balance comment: stood at sink to wash face and brush teeth                           ADL either performed or assessed with clinical judgement   ADL                                         General ADL Comments: Pt min guardA for LB bathing, dressing sit - stand, toilet hygiene, and grooming/hygiene at sink in standing. Pt sup - minguard A  with mobility in general for ADLs, cues for safety. Set up - sup with UB selfcare     Vision Baseline Vision/History: Wears glasses Wears Glasses: At all times Patient Visual Report: No change from baseline       Perception     Praxis      Pertinent Vitals/Pain Pain Assessment: Faces Faces Pain Scale: Hurts a little bit Pain Location: L hand Pain Descriptors / Indicators: Grimacing;Guarding Pain Intervention(s): Monitored during session;Repositioned     Hand Dominance Right   Extremity/Trunk Assessment Upper Extremity Assessment Upper Extremity Assessment: Generalized weakness  Lower Extremity Assessment Lower Extremity Assessment: Defer to PT evaluation   Cervical / Trunk Assessment Cervical / Trunk Assessment: Normal   Communication Communication Communication: No difficulties   Cognition Arousal/Alertness: Awake/alert Behavior During Therapy: WFL for tasks assessed/performed Overall Cognitive Status: History of cognitive impairments - at baseline Area of Impairment: Orientation;Attention;Memory;Following  commands;Safety/judgement;Awareness;Problem solving                 Orientation Level: Disoriented to;Place;Time;Situation Current Attention Level: Sustained Memory: Decreased short-term memory Following Commands: Follows one step commands inconsistently Safety/Judgement: Decreased awareness of safety;Decreased awareness of deficits Awareness: Intellectual Problem Solving: Requires verbal cues;Requires tactile cues General Comments: very poor safety awareness, crying and constantly asking to go home   General Comments  VSS on RA,    Exercises     Shoulder Instructions      Home Living Family/patient expects to be discharged to:: Skilled nursing facility Living Arrangements: Spouse/significant other Available Help at Discharge: Family;Available 24 hours/day Type of Home: House                       Home Equipment: Walker - 2 wheels;Cane - single point   Additional Comments: per prior hospitalization, husband available 24/7      Prior Functioning/Environment Level of Independence: Needs assistance  Gait / Transfers Assistance Needed: Husband reports pt ambulatory without DME, does not use SPC or RW but has used before ADL's / Homemaking Assistance Needed: Husband reports pt typically able to perform these tasks herself, but states, "We've had to help before"   Comments: Pt is A&Ox1 and unreliable historian, home set up collected from prior hospitalization        OT Problem List: Decreased cognition;Decreased safety awareness;Impaired balance (sitting and/or standing);Decreased activity tolerance;Decreased knowledge of use of DME or AE      OT Treatment/Interventions: Self-care/ADL training;Visual/perceptual remediation/compensation;Therapeutic activities;Neuromuscular education;DME and/or AE instruction;Balance training    OT Goals(Current goals can be found in the care plan section) Acute Rehab OT Goals Patient Stated Goal: go home OT Goal Formulation:  With patient Time For Goal Achievement: 09/11/20 Potential to Achieve Goals: Fair ADL Goals Additional ADL Goal #1: Pt will complete LB ADLs/selfcare set up - sup Additional ADL Goal #2: Pt will transfer to toilet ad shower with sup - Mod I  OT Frequency: Min 2X/week   Barriers to D/C:            Co-evaluation              AM-PAC OT "6 Clicks" Daily Activity     Outcome Measure Help from another person eating meals?: None Help from another person taking care of personal grooming?: A Little Help from another person toileting, which includes using toliet, bedpan, or urinal?: A Little Help from another person bathing (including washing, rinsing, drying)?: A Little Help from another person to put on and taking off regular upper body clothing?: A Little Help from another person to put on and taking off regular lower body clothing?: A Little 6 Click Score: 19   End of Session Equipment Utilized During Treatment: Gait belt;Rolling walker  Activity Tolerance: Patient tolerated treatment well Patient left: with call bell/phone within reach;in chair;with chair alarm set  OT Visit Diagnosis: Unsteadiness on feet (R26.81);Other symptoms and signs involving cognitive function;Muscle weakness (generalized) (M62.81)                Time: 0867-6195 OT Time Calculation (min): 23 min Charges:  OT General Charges $OT Visit:  1 Visit OT Evaluation $OT Eval Moderate Complexity: 1 Mod OT Treatments $Self Care/Home Management : 8-22 mins    Margaretmary Eddy [facility] 08/28/2020, 2:09 PM

## 2020-08-28 NOTE — TOC Initial Note (Signed)
Transition of Care [facility]) - Initial/Assessment Note    Patient Details  Name: Paula Reed MRN: [id number] Date of Birth: 22-Sep-1945  [facility] CM/SW Contact:    Cristobal Goldmann, LCSW Phone Number: 08/28/2020, 1:43 PM  Clinical Narrative:  CSW talked with spouse and patient at the bedside to discuss her discharge disposition. Paula Reed was lying in bed alert and in pain, and moaned throughout CSW's visit. Per husband, her hand was hurting. Paula Reed reported that he lives with his son in the home that used to belong to him and his wife, however the home is now in his son's name, and he is waiting for an apartment at [employer].   CSW talked with spouse and patient regarding discharge plan, and Paula Reed is seeking LTC placement. CSW explained that LTC placement means a minimum of 30  days in a facility, and Paula Reed expressed understanding.  When asked, Paula Reed reported that his wife has been to [facility] and he was satisfied with the care, and wife nodded agreement. Spouse reported that he has the SNF list and CSW explained facility search process and that he and his wife will be updated regarding facility responses. CSW given permission to talk with their son, Paula Reed. CSW informed that patient has had the COVID vaccinations and first booster (noted in Epic).             Expected Discharge Plan: Skilled Nursing Facility Barriers to Discharge: Other (comment) (Patient and spouse agreeable to LTC placement)   Patient Goals and CMS Choice Patient states their goals for this hospitalization and ongoing recovery are:: Spouse requesting placement (for a minimum of 30 days) as he can no longer care for his wife at home. Wife also agreeable CMS Medicare.gov Compare Post Acute Care list provided to:: Other (Comment Required) (When offered, husband reported that he has SNF list) Choice offered to / list presented to : Spouse (Spouse has SNF list)  Expected  Discharge Plan and Services Expected Discharge Plan: Skilled Nursing Facility In-house Referral: Clinical Social Work     Living arrangements for the past 2 months: Single Family Home (Spouse will be moving to [employer] once an apartment becomes available)                                     Prior Living Arrangements/Services Living arrangements for the past 2 months: Single Family Home (Spouse will be moving to [employer] once an apartment becomes available) Lives with:: Spouse Patient language and need for interpreter reviewed:: Yes Do you feel safe going back to the place where you live?: No   Spouse and wife agreeable to SNF placement - LTC as husband can no longer care for wife at home  Need for Family Participation in Patient Care: Yes (Comment) Care giver support system in place?: Yes (comment)   Criminal Activity/Legal Involvement Pertinent to Current Situation/Hospitalization: No - Comment as needed  Activities of Daily Living   ADL Screening (condition at time of admission) Patient's cognitive ability adequate to safely complete daily activities?: No Is the patient deaf or have difficulty hearing?: No Does the patient have difficulty seeing, even when wearing glasses/contacts?: No Does the patient have difficulty concentrating, remembering, or making decisions?: Yes Patient able to express need for assistance with ADLs?: Yes Does the patient have difficulty dressing or bathing?: No Independently performs ADLs?: No Communication: Independent  Dressing (OT): Needs assistance Is this a change from baseline?: Pre-admission baseline Grooming: Needs assistance Is this a change from baseline?: Pre-admission baseline Feeding: Needs assistance Is this a change from baseline?: Pre-admission baseline Bathing: Needs assistance Is this a change from baseline?: Pre-admission baseline Toileting: Needs assistance Is this a change from baseline?: Pre-admission  baseline In/Out Bed: Needs assistance Is this a change from baseline?: Pre-admission baseline Walks in Home: Needs assistance Is this a change from baseline?: Pre-admission baseline Does the patient have difficulty walking or climbing stairs?: Yes Weakness of Legs: Both Weakness of Arms/Hands: None  Permission Sought/Granted Permission sought to share information with : Family Supports Permission granted to share information with : Yes, Verbal Permission Granted  Share Information with NAME: Husband - Paula Reed and son - Paula Reed     Permission granted to share info w Relationship: Billey Gosling is spouse and Paula Reed is son  Permission granted to share info w Contact Information: Spouse - [phone] and son Paula Reed - [phone]  Emotional Assessment Appearance:: Appears stated age Attitude/Demeanor/Rapport: Other (comment) (Appropriate) Affect (typically observed): Pleasant Orientation: : Oriented to Self Alcohol / Substance Use: Tobacco Use,Alcohol Use,Illicit Drugs (Per H&P, patient does not smoke, drink, or use illicit drugs) Psych Involvement: No (comment)  Admission diagnosis:  Hyperglycemia [R73.9] Patient Active Problem List   Diagnosis Date Noted  . Type 2 diabetes mellitus treated with insulin (HCC) 08/27/2020  . Hyperglycemia 08/27/2020  . Anemia   . Coagulopathy (HCC)   . Bacteremia   . Compromised airway   . Hand pain, left 08/13/2020  . Stridor 08/12/2020  . Laryngeal edema   . Pain and swelling of right forearm 06/14/2020  . Pain due to onychomycosis of toenails of both feet 07/26/2019  . History of chronic cough 02/10/2019  . Ischemic cerebrovascular accident (CVA) (HCC) 08/26/2018  . MRSA bacteremia 06/01/2018  . Allergic rhinitis 05/12/2018  . Seizures (HCC) 06/28/2017  . Probable Alzheimer's dementia without behavioral disturbance 09/15/2015  . Rash and nonspecific skin eruption 12/18/2014  . Rash 09/20/2014  . Bruit of right carotid  artery 03/26/2014  . Trigger finger 11/03/2012  . Heart murmur 04/22/2012  . Diabetes (HCC) 02/02/2012  . Hypertension 02/02/2012  . Hyperlipidemia 02/02/2012  . Routine adult health maintenance 02/02/2012   PCP:  Latrelle Dodrill, MD Pharmacy:   [id number] - [place], [place] - [address] [place] [address] Phone: [phone] Fax: [phone]  [facility] - [address] [place] [address] Phone: [phone] Fax: [phone]    Social Determinants of Health (SDOH) Interventions  None needed or requested at this time  Readmission Risk Interventions No flowsheet data found.

## 2020-08-28 NOTE — Progress Notes (Signed)
Reached out to patient's medical power of attorney and son Paula Reed to discuss CODE STATUS because we are unable to establish this on her admission.  Paula Reed said that she would want to go any route to limit pain and suffering and feels that she would want to be a DNR.  CODE STATUS has been changed.  We also had further discussion regarding future goals of care although we do not feel that she is actively dying future planning is desired.  A palliative care consult has been placed.

## 2020-08-29 DIAGNOSIS — Z794 Long term (current) use of insulin: Secondary | ICD-10-CM

## 2020-08-29 DIAGNOSIS — I10 Essential (primary) hypertension: Secondary | ICD-10-CM

## 2020-08-29 DIAGNOSIS — E1149 Type 2 diabetes mellitus with other diabetic neurological complication: Secondary | ICD-10-CM | POA: Diagnosis not present

## 2020-08-29 DIAGNOSIS — E782 Mixed hyperlipidemia: Secondary | ICD-10-CM

## 2020-08-29 DIAGNOSIS — R627 Adult failure to thrive: Principal | ICD-10-CM

## 2020-08-29 LAB — GLUCOSE, CAPILLARY
Glucose-Capillary: 264 mg/dL — ABNORMAL HIGH (ref 70–99)
Glucose-Capillary: 322 mg/dL — ABNORMAL HIGH (ref 70–99)
Glucose-Capillary: 428 mg/dL — ABNORMAL HIGH (ref 70–99)
Glucose-Capillary: 407 mg/dL — ABNORMAL HIGH (ref 70–99)

## 2020-08-29 MED ORDER — INSULIN GLARGINE 100 UNIT/ML ~~LOC~~ SOLN
12.0000 [IU] | Freq: Every day | SUBCUTANEOUS | Status: DC
  Administered 2020-08-29 – 2020-08-30 (×2): 12 [IU] via SUBCUTANEOUS
  Filled 2020-08-29 (×2): qty 0.12

## 2020-08-29 MED ORDER — INSULIN GLARGINE 100 UNIT/ML ~~LOC~~ SOLN
15.0000 [IU] | Freq: Every day | SUBCUTANEOUS | Status: DC
  Filled 2020-08-29: qty 0.15

## 2020-08-29 NOTE — Progress Notes (Signed)
Results for SABEEN, PIECHOCKI (MRN [id number]) as of 08/29/2020 14:11  Ref. Range 08/28/2020 11:23 08/28/2020 16:54 08/28/2020 20:21 08/29/2020 06:30 08/29/2020 11:29  Glucose-Capillary Latest Ref Range: 70 - 99 mg/dL 573 (H) 220 (H) 254 (H) 264 (H) 322 (H)  Noted that blood sugars continue to be greater than 180 mg/dl.  Recommend increasing Novolog to MODERATE (0-15 units) TID & continue Novolog HS scale if blood sugars continue to be elevated.   Smith Mince RN BSN CDE Diabetes Coordinator Pager: [phone]  8am-5pm

## 2020-08-29 NOTE — Progress Notes (Addendum)
Family Medicine Teaching Service Daily Progress Note Intern Pager: [phone]  Patient name: Paula Reed Medical record number: [id number] Date of birth: 08/14/1945 Age: 75 y.o. Gender: female  Primary Care Provider: Latrelle Dodrill, MD Consultants: [facility] Code Status: DNR  Pt Overview and Major Events to Date:  5/4- admitted for Failure to Thrive  Assessment and Plan: AVIS TIRONE is a 75 y.o. female presenting with failure to thrive. PMH is significant for seizures, alzheimer's dementia, HTN, HLD, T2DM.   Failure to Thrive in the setting of high-dose steroids Patient admitted for failure to thrive after recent discharge on high dose Prednisone. Affect is tearful and changed from baseline and likely a side effect from steriods. Patient is unable to complete ADLs and husband  is overwhelmed with her care and requests she be admitted to a nursing facility.  She is medically stable for discharge. TOC is currently working on SNF placement. -PT/OT eval and treat -Vitals per floor protocol    Anemia  Factor VIII Inhibitor-Related Bleeding, improving Last hemoglobin was 7.7 yesterday.  Patient on high-dose steroid taper for Factor VIII inhibitor. Currently taking Prednisone 90mg . MCV is elevated, had anemia workup at last admission, except TSH. We reached out to her hematologist to discuss her VTE prophylaxis and they recommended holding anticoagulation due her risk for bleeding.  We will continue to monitor.  -continue Prednisone 50mg  at breakfast and 40mg  at lunch -Transfusion threshold 7 -TSH lab     Type 2 DM: chronic, improving CBG overnight was 413 and Lantus was increased to home dose of 12 units. Fasting CBG this morning 264. She takes Ozempic 0.25mg  weekly on Thursdays and will start today if home medication is brought in for administration.Currently taking steroids, we will continue monitor for hyperglycemia.  -continue  Lantus 12U daily -sSSI   -Diabetes educator  following -start Semaglutide 0.25 mg  -CBG monitoring   HTN: chronic, stable BP was 119/50 this morning.   Home medications notable for Amlodipine 5mg  daily.  -Continue Amlodipine 5 mg  -Monitor BP    HLD  CVA: chronic, stable Home medications includes Atorvastatin 80mg   -Continue Lipitor 80 mg    Alzheimer's Dementia Goals of Care Palliative Care Patient has a history of AD.Patient is Palliative Medicine spoke with son, and given that patient will be discharged soon they will refer for an outpatient consult at [facility].  -Delirium precautions -outpatient Palliative Care/GOC consult   Hx Seizures, stable Followed by Neurology, last seen 05/24/19. Not on any antiepileptics currently. Last seizure event on 2/19. -Seizure precautions    Hx Spontaneous Submucosal Hemorrhage, stable Recent admission last month for acute hypoxic respiratory failure. ENT scope revealed what appeared to be spontaneous submucosal hemorrhage. She ultimately required intubation and short ICU stay.   -Monitor respiratory status -Continuous pulse ox  FEN/GI: Carb modified diet PPx: SCDs   Status is: Inpatient  Remains inpatient appropriate because:Unsafe d/c plan   Dispo: The patient is from: Home              Anticipated d/c is to: SNF              Patient currently is medically stable to d/c.   Difficult to place patient No        Subjective:  Paula Reed reports that she is feeling well this morning. She is only oriented to person. She reports some mild pain in her 4th digit of her left hand, but states its not bothering her too much.  She denies any other pain. She is eating and drinking well and does not have any complaints this morning.   Objective: Temp:  [97.8 F (36.6 C)-98.8 F (37.1 C)] 97.8 F (36.6 C) (05/05 0447) Pulse Rate:  [67-106] 67 (05/05 0447) Resp:  [16-18] 17 (05/05 0447) BP: (119-153)/(50-78) 119/50 (05/05 0447) SpO2:  [98 %-100 %] 100 % (05/05 0447) Physical  Exam: General: Patient lying inn bed watching TV.  Cardiovascular: RRR. Radial Pulses 2+  Respiratory: Normal WOB, CTA b/l Abdomen: Normoactive bowel sounds. Non-tender to palpation Extremities: Moves extremities spontaneously. Mild tenderness over 4th digit in L. Hand. Full flexion, extension of digits intact. No erythema, warmth or swelling.   Laboratory: Recent Labs  Lab 08/26/20 0939 08/28/20 0716  WBC 18.5* 13.8*  HGB 7.3* 7.7*  HCT 24.1* 25.5*  PLT 380 397   Recent Labs  Lab 08/26/20 0939  NA 133*  K 3.6  CL 97*  CO2 25  BUN 17  CREATININE 0.78  CALCIUM 9.3  PROT 8.1  BILITOT 2.7*  ALKPHOS 75  ALT 15  AST 13*  GLUCOSE 441*    Imaging/Diagnostic Tests:  Katherine Mantle, Medical Student 08/29/2020, 7:17 AM MS4, [facility] Family [facility] FPTS Intern pager: [phone], text pages welcome  FPTS Upper-Level Resident Addendum   I have independently interviewed and examined the patient. I have discussed the above with the original author and agree with their documentation. Please see also any attending notes.    Dana Allan MD PGY-2, [facility] 08/29/2020 12:47 PM  FPTS Service pager: [phone] (text pages welcome through [facility])

## 2020-08-30 DIAGNOSIS — R739 Hyperglycemia, unspecified: Secondary | ICD-10-CM | POA: Diagnosis not present

## 2020-08-30 LAB — CBC
HCT: 25.3 % — ABNORMAL LOW (ref 36.0–46.0)
Hemoglobin: 7.8 g/dL — ABNORMAL LOW (ref 12.0–15.0)
MCH: 31 pg (ref 26.0–34.0)
MCHC: 30.8 g/dL (ref 30.0–36.0)
MCV: 100.4 fL — ABNORMAL HIGH (ref 80.0–100.0)
Platelets: 409 10*3/uL — ABNORMAL HIGH (ref 150–400)
RBC: 2.52 MIL/uL — ABNORMAL LOW (ref 3.87–5.11)
RDW: 21.7 % — ABNORMAL HIGH (ref 11.5–15.5)
WBC: 16.4 10*3/uL — ABNORMAL HIGH (ref 4.0–10.5)
nRBC: 0.1 % (ref 0.0–0.2)

## 2020-08-30 LAB — BASIC METABOLIC PANEL
Anion gap: 8 (ref 5–15)
BUN: 19 mg/dL (ref 8–23)
CO2: 27 mmol/L (ref 22–32)
Calcium: 9.3 mg/dL (ref 8.9–10.3)
Chloride: 97 mmol/L — ABNORMAL LOW (ref 98–111)
Creatinine, Ser: 0.68 mg/dL (ref 0.44–1.00)
GFR, Estimated: >60 mL/min (ref >60)
Glucose, Bld: 304 mg/dL — ABNORMAL HIGH (ref 70–99)
Potassium: 3.5 mmol/L (ref 3.5–5.1)
Sodium: 132 mmol/L — ABNORMAL LOW (ref 135–145)

## 2020-08-30 LAB — GLUCOSE, CAPILLARY
Glucose-Capillary: 246 mg/dL — ABNORMAL HIGH (ref 70–99)
Glucose-Capillary: 291 mg/dL — ABNORMAL HIGH (ref 70–99)
Glucose-Capillary: 451 mg/dL — ABNORMAL HIGH (ref 70–99)

## 2020-08-30 LAB — TSH: TSH: 0.805 u[IU]/mL (ref 0.350–4.500)

## 2020-08-30 MED ORDER — INSULIN GLARGINE 100 UNIT/ML ~~LOC~~ SOLN: 12.0000 [IU] | Freq: Every day | SUBCUTANEOUS | Status: DC

## 2020-08-30 MED ORDER — INSULIN GLARGINE 100 UNIT/ML ~~LOC~~ SOLN: 20.0000 [IU] | Freq: Every day | SUBCUTANEOUS | Status: DC

## 2020-08-30 MED ORDER — INSULIN GLARGINE 100 UNIT/ML SOLOSTAR PEN: 15.0000 [IU] | PEN_INJECTOR | Freq: Every day | SUBCUTANEOUS | 11 refills | Status: AC

## 2020-08-30 MED ORDER — INSULIN GLARGINE 100 UNIT/ML ~~LOC~~ SOLN: 15.0000 [IU] | Freq: Every day | SUBCUTANEOUS | Status: DC

## 2020-08-30 MED ORDER — INSULIN ASPART 100 UNIT/ML IJ SOLN
5.0000 [IU] | Freq: Three times a day (TID) | INTRAMUSCULAR | Status: DC
  Administered 2020-08-30 (×2): 5 [IU] via SUBCUTANEOUS

## 2020-08-30 NOTE — Discharge Summary (Signed)
[facility] Discharge Summary  Patient name: Paula Reed Medical record number: [id number] Date of birth: 1946/03/16 Age: 75 y.o. Gender: female Date of Admission: 08/27/2020  Date of Discharge: 08/30/2020 Admitting Physician: Leeanne Rio, MD  Primary Care Provider: Leeanne Rio, MD Consultants: None   Indication for Hospitalization: Failure to thrive  Discharge Diagnoses/Problem List:  Failure to thrive in the setting of high-dose steroids Anemia/factor VIII inhibitor related bleeding T2DM HTN HLD Alzheimer's dementia History of seizures History of spontaneous submucosal hemorrhage  Disposition: SNF  Discharge Condition: Stable  Discharge Exam:  Temp:  [98 F (36.7 C)-98.4 F (36.9 C)] 98.2 F (36.8 C) (05/06 0615) Pulse Rate:  [76-90] 76 (05/06 0615) Resp:  [17-19] 18 (05/06 0615) BP: (139-149)/(65-73) 142/72 (05/06 0615) SpO2:  [97 %-99 %] 97 % (05/06 0615) Weight:  [88.2 kg] 88.2 kg (05/05 2052) Physical Exam: General: Sitting up in bed, weeping.  Cardiovascular: Regular rate and rhythm Respiratory: Normal work of breathing. Clear breath sounds bilaterally Abdomen: Normoactive bowel sounds. Soft, non-distended. Non-tender to palpation Extremities: No peripheral edema  Brief Hospital Course:  Paula Reed is a 75 y.o. female presenting with failure to thrive. PMH is significant for seizures, alzheimer's dementia, HTN, HLD, T2DM.   Failure to thrive in the setting of high-dose steroids Patient recently initiated on high-dose steroids for hematologic disorder.  She was seen by her PCP and taken there by her husband.  Her husband was having considerable difficulty managing her medications and reported that she had gone from a pleasantly demented patient to considerably sad, intermittently crying.  The husband was concerned about his ability to safely manage the patient's meds and was requesting a nursing home facility.   Given concern for the patient's safety she was admitted to the hospital and social work was consulted regarding placement needs.  Throughout hospitalization patient remained oriented to person but not place or time.  She was intermittently tearful.  PT and OT evaluated the patient and recommended SNF.  Anemia factor VIII inhibitor related bleeding Patient is managed by hematology.  She is currently on steroid taper.  Her dose was decreased to 90 mg daily and it should continue to be decreased daily by 10 mg.  Please see taper schedule.  T2DM Home medication regimen includes Lantus 15 units daily, semaglutide 0.25 mg weekly.  While hospitalized she was initiated on 12 units of Lantus which was increased to 20 on the day of discharge given continued hyperglycemia.  Patient should continue to have regular blood glucoses and her Lantus adjusted depending on what those blood sugars are.  Do best to avoid hypoglycemia.  Goals of care Discussed goals of care with patient's medical power of attorney, her son Kyung Rudd.  He reports that she would like to limit suffering if best possible and her CODE STATUS was changed from a full code to a DNR.  We discussed future goals of care planning and have consulted palliative care who will contact the son outpatient and set up a meeting.    Issues for Follow Up:  1. Follow-up with palliative care on goals of care discussion 2. Continue steroid taper 3. Close monitoring of blood glucoses given high-dose steroids for significant duration  Below is steroid taper schedule   Suggested Steroid Taper: Decrease by 32m per week for 10 weeks every Tuesday  08/20/20 to 5/2: Take 552mtwice daily at breakfast and lunch 5/3 to 5/9: Take 5019mt breakfast and 56m4m lunch 5/10  to 5/16: Take 25m at breakfast and 443mat lunch 5/17 to 5/23: Take 4057mt breakfast and 15m53m lunch 5/24 to 5/30: Take 15mg46mbreakfast and 15mg 78munch 5/31 to 6/6: Take 15mg a48meakfast  and 20mg at8mch 6/7 to 6/13: Take 20mg at 75mkfast and 20mg at l76m 6/14 to 6/20: Take 20mg at br69mast and 10mg at lun41m/21 to 6/27: Take 10mg at brea78mt and 10mg at lunch79m8 to 7/4: Take 10mg at breakf13monly 7/5: Stop prednisone   Significant Procedures: None  Significant Labs and Imaging:  Recent Labs  Lab 08/26/20 0939 08/28/20 0716 08/30/20 0411  WBC 18.5* 13.8* 16.4*  HGB 7.3* 7.7* 7.8*  HCT 24.1* 25.5* 25.3*  PLT 380 397 409*   Recent Labs  Lab 08/26/20 0939 08/30/20 0411  NA 133* 132*  K 3.6 3.5  CL 97* 97*  CO2 25 27  GLUCOSE 441* 304*  BUN 17 19  CREATININE 0.78 0.68  CALCIUM 9.3 9.3  ALKPHOS 75  --   AST 13*  --   ALT 15  --   ALBUMIN 3.2*  --     TSH-0.85  Results/Tests Pending at Time of Discharge: None  Discharge Medications:  Allergies as of 08/30/2020      Reactions   Metformin And Related Rash   Chlorthalidone Rash   Per [facility] records, pt may have had a rash rxn to chlorthalidone      Medication List    TAKE these medications   acetaminophen 500 MG tablet Commonly known as: TYLENOL Take 1,000 mg by mouth every 6 (six) hours as needed for headache (pain).   amLODipine 5 MG tablet Commonly known as: NORVASC Take 1 tablet (5 mg total) by mouth at bedtime.   atorvastatin 80 MG tablet Commonly known as: LIPITOR TAKE 1 TABLET BY MOUTH DAILY AT 6 PM. What changed: when to take this   insulin glargine 100 UNIT/ML Solostar Pen Commonly known as: LANTUS Inject 15 Units into the skin daily. What changed: when to take this   Insulin Syringe-Needle U-100 31G X 5/16" 0.3 ML Misc Commonly known as: BD Insulin Syringe U/F USE TO INJECT LANTUS DAILY   ONE TOUCH DELICA LANCING DEV Misc Check blood sugar once daily   OneTouch Delica Lancets 33G Misc Check 26Sod sugar once daily   OneTouch Verio test strip Generic drug: glucose blood Check blood sugar once daily   OneTouch Verio w/Device Kit Check blood sugar once  daily   Ozempic (0.25 or 0.5 MG/DOSE) 2 MG/1.5ML Sopn Generic drug: Semaglutide(0.25 or 0.5MG/DOS) Inject 0.25 mg into the skin once a week. (Thursdays) What changed:   when to take this  additional instructions   Pen Needles 32G X 4 MM Misc Inject 1 Dose into the skin as directed.   polyethylene glycol 17 g packet Commonly known as: MIRALAX / GLYCOLAX Take 17 g by mouth daily as needed for moderate constipation.   predniSONE 50 MG tablet Commonly known as: DELTASONE Take 1 tablet (50 mg total) by mouth 2 (two) times daily with breakfast and lunch. What changed: Another medication with the same name was changed. Make sure you understand how and when to take each.   predniSONE 10 MG tablet Commonly known as: DELTASONE Take at total of 90 mg daily for a week.  Taper by 10 mg every week per instructions. What changed:   how much to take  how to take this  when to take this   triamcinolone  ointment 0.5 % Commonly known as: KENALOG Apply 1 application topically 2 (two) times daily as needed. What changed: reasons to take this       Discharge Instructions: Please refer to Patient Instructions section of EMR for full details.  Patient was counseled important signs and symptoms that should prompt return to medical care, changes in medications, dietary instructions, activity restrictions, and follow up appointments.   Follow-Up Appointments:   Gifford Shave, MD 08/30/2020, 3:49 PM PGY-2, [place]

## 2020-08-30 NOTE — Hospital Course (Addendum)
Paula Reed is a 75 y.o. female presenting with failure to thrive. PMH is significant for seizures, alzheimer's dementia, HTN, HLD, T2DM.   Failure to thrive in the setting of high-dose steroids Patient recently initiated on high-dose steroids for hematologic disorder.  She was seen by her PCP and taken there by her husband.  Her husband was having considerable difficulty managing her medications and reported that she had gone from a pleasantly demented patient to considerably sad, intermittently crying.  The husband was concerned about his ability to safely manage the patient's meds and was requesting a nursing home facility.  Given concern for the patient's safety she was admitted to the hospital and social work was consulted regarding placement needs.  Throughout hospitalization patient remained oriented to person but not place or time.  She was intermittently tearful.  PT and OT evaluated the patient and recommended SNF.  Anemia factor VIII inhibitor related bleeding Patient is managed by hematology.  She is currently on steroid taper.  Her dose was decreased to 90 mg daily and it should continue to be decreased daily by 10 mg.  Please see taper schedule.  T2DM Home medication regimen includes Lantus 15 units daily, semaglutide 0.25 mg weekly.  While hospitalized she was initiated on 12 units of Lantus which was increased to 20 on the day of discharge given continued hyperglycemia.  Patient should continue to have regular blood glucoses and her Lantus adjusted depending on what those blood sugars are.  Do best to avoid hypoglycemia.  Goals of care Discussed goals of care with patient's medical power of attorney, her son Marcy Salvo.  He reports that she would like to limit suffering if best possible and her CODE STATUS was changed from a full code to a DNR.  We discussed future goals of care planning and have consulted palliative care who will contact the son outpatient and set up a meeting.

## 2020-08-30 NOTE — Progress Notes (Signed)
Inpatient Diabetes Program Recommendations  AACE/ADA: New Consensus Statement on Inpatient Glycemic Control (2015)  Target Ranges:  Prepandial:   less than 140 mg/dL      Peak postprandial:   less than 180 mg/dL (1-2 hours)      Critically ill patients:  140 - 180 mg/dL   Lab Results  Component Value Date   GLUCAP 246 (H) 08/30/2020   HGBA1C 5.4 08/28/2020    Review of Glycemic Control Results for Paula Reed, Paula Reed (MRN [id number]) as of 08/30/2020 09:11  Ref. Range 08/29/2020 06:30 08/29/2020 11:29 08/29/2020 17:10 08/29/2020 20:53 08/30/2020 06:39  Glucose-Capillary Latest Ref Range: 70 - 99 mg/dL 517 (H)  Novolog 5 units given  322 (H)  Novolog 7 units 428 (H)  Novolog 9 units 407 (H)  Novolog 5 units 246 (H)  Novolog 3 units    Diabetes history: DM 2 Outpatient Diabetes medications: Lantus 15 units, Ozempic 0.25 mg QThursday Current orders for Inpatient glycemic control:  Lantus 12 units Novolog 0-9 units tid + hs  PO prednisone 40 mg bid  Inpatient Diabetes Program Recommendations:    Note pt on steroids  -  Increase Lantus to home dose 15 units -  Add Novolog 4 units tid meal coverage if eating >50% of meals  Thanks,  Christena Deem RN, MSN, BC-ADM Inpatient Diabetes Coordinator Team Pager [phone] (8a-5p)

## 2020-08-30 NOTE — Progress Notes (Signed)
Physical Therapy Treatment Patient Details Name: Paula Reed MRN: [id number] DOB: [birth year] Today's Date: 08/30/2020    History of Present Illness 75 y.o. female presenting with failure to thrive after previous admission from 4/18-4/26 for acute hypoxemic respiratory failure 2/2 laryngeal hematoma and spontaneous submucosal hemorrhage requiring intubation and short ICU stay discharged with Home Health.  PMH is significant for seizures, alzheimer's dementia, HTN, HLD, T2DM.    PT Comments    Pt supine in bed singing along with spiritual music on computer. Pt with nondescript pain in L hand with grimace with gripping. Pt agreeable to ambulation with therapy. Pt requires supervision for bed mobility, min guard for transfers and contact guard assist for ambulation in hallway with RW. Pt with better RW awareness today. Current discharge plans are appropriate. PT will continue to follow acutely.    Follow Up Recommendations  Supervision/Assistance - 24 hour;Other (comment);SNF;LTACH (Pt to d/c to SNF ultimately transferring to Long term care facility Pt would benefit from short term PT for safety with DME at [facility]     Equipment Recommendations  None recommended by PT       Precautions / Restrictions Precautions Precautions: Fall    Mobility  Bed Mobility Overal bed mobility: Needs Assistance Bed Mobility: Supine to Sit     Supine to sit: Supervision     General bed mobility comments: supervision to come to EoB    Transfers Overall transfer level: Needs assistance Equipment used: Rolling walker (2 wheeled) Transfers: Sit to/from [employer] Sit to Stand: Min guard            Ambulation/Gait Ambulation/Gait assistance: Editor, commissioning (Feet): 150 Feet Assistive device: Rolling walker (2 wheeled) Gait Pattern/deviations: Step-through pattern;Decreased step length - right;Decreased step length - left Gait velocity: decreased Gait velocity  interpretation: <1.8 ft/sec, indicate of risk for recurrent falls General Gait Details: contact guard assist for safety, pt able to ambulate with slowed, steady gait in hallway, slight difficulty with navigation of RW around obstacles in room but no LoB      Balance Overall balance assessment: Needs assistance Sitting-balance support: Bilateral upper extremity supported;No upper extremity supported;Single extremity supported Sitting balance-Leahy Scale: Fair     Standing balance support: Single extremity supported;Bilateral upper extremity supported;No upper extremity supported;During functional activity Standing balance-Leahy Scale: Fair                              Cognition Arousal/Alertness: Awake/alert Behavior During Therapy: WFL for tasks assessed/performed Overall Cognitive Status: History of cognitive impairments - at baseline Area of Impairment: Orientation;Attention;Memory;Following commands;Safety/judgement;Awareness;Problem solving                 Orientation Level: Disoriented to;Place;Time;Situation Current Attention Level: Sustained Memory: Decreased short-term memory Following Commands: Follows one step commands inconsistently Safety/Judgement: Decreased awareness of safety;Decreased awareness of deficits Awareness: Intellectual Problem Solving: Requires verbal cues;Requires tactile cues General Comments: pt is plesantly confused today, telling the same story about church repeatedly      Exercises      General Comments General comments (skin integrity, edema, etc.): VSS on RA      Pertinent Vitals/Pain Faces Pain Scale: Hurts a little bit Pain Location: hand and stomach Pain Descriptors / Indicators: Grimacing;Guarding;Moaning           PT Goals (current goals can now be found in the care plan section) Acute Rehab PT Goals Patient Stated Goal: go home PT Goal  Formulation: Patient unable to participate in goal setting Time For Goal  Achievement: 09/11/20 Potential to Achieve Goals: Fair Progress towards PT goals: Progressing toward goals    Frequency    Min 3X/week      PT Plan Current plan remains appropriate       AM-PAC PT "6 Clicks" Mobility   Outcome Measure  Help needed turning from your back to your side while in a flat bed without using bedrails?: None Help needed moving from lying on your back to sitting on the side of a flat bed without using bedrails?: None Help needed moving to and from a bed to a chair (including a wheelchair)?: A Little Help needed standing up from a chair using your arms (e.g., wheelchair or bedside chair)?: A Little Help needed to walk in hospital room?: A Little Help needed climbing 3-5 steps with a railing? : A Lot 6 Click Score: 19    End of Session Equipment Utilized During Treatment: Gait belt Activity Tolerance: Patient tolerated treatment well Patient left: in chair;with call bell/phone within reach;with chair alarm set Nurse Communication: Mobility status PT Visit Diagnosis: Other abnormalities of gait and mobility (R26.89)     Time: 1355-1415 PT Time Calculation (min) (ACUTE ONLY): 20 min  Charges:  $Therapeutic Exercise: 8-22 mins                     Paula Reed PT, DPT Acute Rehabilitation Services Pager [phone] Office [phone]    Elon Alas Fleet 08/30/2020, 2:23 PM

## 2020-08-30 NOTE — TOC Transition Note (Signed)
Transition of Care [facility]) - CM/SW Discharge Note *Discharged to [facility] *Room 123A *Phone number for report: [phone]   Patient Details  Name: Paula Reed MRN: [id number] Date of Birth: 10-30-1945  Transition of Care [facility]) CM/SW Contact:  Cristobal Goldmann, LCSW Phone Number: 08/30/2020, 5:05 PM   Clinical Narrative:  Patient medically stable for discharge and facility selected - [facility]. Initiated insurance authorization earlier today and auth received (2;21 pm): Approved for 5 days, eff. 5/6 and next review date is 5/10. Plan Auth ID - [id number] and Reference # - [id number]. Care Coordinator - Laneta Simmers; fax number for continued stay clinicals: [phone] and phone number: [phone]. This information was provided to [place] in admissions. When asked, CSW informed that patient's 5/3 negative COVID test was fine. Patient now DNR and form was signed by attending MD. Husband contacted regarding ambulance transport.    Final next level of care: Skilled Nursing Facility [facility]) Barriers to Discharge: Barriers Resolved   Patient Goals and CMS Choice Patient states their goals for this hospitalization and ongoing recovery are:: Souse and wife agreeable to placement. Wife will initally have rehab then transiiton to LTC. This was discussed with admissions staff person Antionette CMS Medicare.gov Compare Post Acute Care list provided to:: Patient Represenative (must comment) (Patient's husband informed CSW that he had SNF list) Choice offered to / list presented to : Spouse,Patient  Discharge Placement   Existing PASRR number confirmed : 08/28/20          Patient chooses bed at: Other - please specify in the comment section below: [facility]) Patient to be transferred to facility by: Non-emergency ambulance transport Name of family member notified: Spouse Erynne Kealey [phone]) Patient and family notified of of transfer:  08/30/20  Discharge Plan and Services In-house Referral: Clinical Social Work                                 Social Determinants of Health (SDOH) Interventions  No SDOH interventions requested or needed at discharge.   Readmission Risk Interventions No flowsheet data found.

## 2020-08-30 NOTE — Progress Notes (Addendum)
Family Medicine Teaching Service Daily Progress Note Intern Pager: [phone]  Patient name: Paula Reed Medical record number: [id number] Date of birth: [birth year] Age: 75 y.o. 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 She is medically stable for discharge. TOC is currently working on SNF placement. -PT/OT eval and treat -Vitals per floor protocol   Anemia Factor VIII Inhibitor-Related Bleeding, improving Hemoglobin is stable at 7.8. 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 Currently taking Prednisone 90mg . We will continue to monitor.  -continue Prednisone 50mg  at breakfast and 40mg  at lunch -Transfusion threshold 7   Type 2 DM: chronic, improving Fasting CBG this morning 246.Received Lantus 12u and Aspart 26u over the last 24hrs. CBGs have been ranging from 264-428. Given large range, we will increase basal insulin for longer glycemic control. Currently taking steroids, we will continue monitor for hyperglycemia. -Increase Lantus to 20 units daily -sSSI -Diabetes educatorfollowing -CBGmonitoring  HTN: chronic, stable BPwas119/50 this morning.  Home medications notable for Amlodipine 5mg  daily. -Continue Amlodipine 5 mg -Monitor BP  HLD  CVA: chronic, stable Home medications includes Atorvastatin 80mg  -Continue Lipitor 80 mg   Alzheimer's DementiaGoals of Care Palliative Care Patient has a history of AD. Patient is oriented to person only. Planning for Outpatient Palliative Care meeting.   -Delirium precautions -outpatient Palliative Care/GOC  consult  HxSeizures, stable Followed by Neurology, last seen 05/24/19. Not on any antiepileptics currently. Last seizure event on 2/19. -Seizure precautions  Hx Spontaneous Submucosal Hemorrhage, stable Recent admission last month for acute hypoxic respiratory failure. ENT scope revealed what appeared to be spontaneous submucosal hemorrhage. She ultimately required intubation and short ICU stay.   -Monitor respiratory status -Continuous pulse ox  FEN/GI: Card-modified PPx: SCDs   Status is: Inpatient  Remains inpatient appropriate because:Unsafe d/c plan   Dispo: The patient is from: Home              Anticipated d/c is to: SNF              Patient currently is medically stable to d/c.   Difficult to place patient No        Subjective:  [name] is tearful this morning, but denies being upset or any pain. She states she doesn't know why she is crying She reports eating and drinking well. She is only oriented to person this morning. She does not have any complaints or concerns.  Objective: Temp:  [98 F (36.7 C)-98.4 F (36.9 C)] 98.2 F (36.8 C) (05/06 0615) Pulse Rate:  [76-90] 76 (05/06 0615) Resp:  [17-19] 18 (05/06 0615) BP: (139-149)/(65-73) 142/72 (05/06 0615) SpO2:  [97 %-99 %] 97 % (05/06 0615) Weight:  [88.2 kg] 88.2 kg (05/05 2052) Physical Exam: General: Sitting up in bed, weeping.  Cardiovascular: Regular rate and rhythm Respiratory: Normal work of breathing. Clear breath sounds bilaterally Abdomen: Normoactive bowel sounds. Soft, non-distended. Non-tender to palpation Extremities: No peripheral edema  Laboratory: Recent Labs  Lab 08/26/20 0939 08/28/20 0716 08/30/20 0411  WBC 18.5* 13.8* 16.4*  HGB 7.3* 7.7* 7.8*  HCT 24.1* 25.5* 25.3*  PLT 380 397 409*   Recent Labs  Lab 08/26/20  1829 08/30/20 0411  NA 133* 132*  K 3.6 3.5  CL 97* 97*  CO2 25 27  BUN 17 19  CREATININE 0.78 0.68  CALCIUM 9.3 9.3  PROT 8.1  --   BILITOT 2.7*  --    ALKPHOS 75  --   ALT 15  --   AST 13*  --   GLUCOSE 441* 304*     Imaging/Diagnostic Tests:   Katherine Mantle, Medical Student 08/30/2020, 7:36 AM MS4, [facility] Family Medicine FPTS Intern pager: [phone], text pages welcome  Resident Attestation  I saw and evaluated the patient, performing the key elements of the service.I  personally performed or re-performed the history, physical exam, and medical decision making activities of this service and have verified that the service and findings are accurately documented in the student's note. I developed the management plan that is described in the medical student's note, and I agree with the content, with my edits above.    Derrel Nip, PGY2

## 2020-08-30 NOTE — TOC Progression Note (Signed)
Transition of Care [facility]) - Progression Note    Patient Details  Name: TABITA CORBO MRN: [id number] Date of Birth: Sep 28, 1945  Transition of [facility]) CM/SW Contact  Okey Dupre Lazaro Arms, LCSW Phone Number: 08/30/2020, 10:31 AM  Clinical Narrative: Call made to Antoinette, admissions director at [facility] regarding placement for patient. Admissions director advised that patient/husband interested in eventual LTC placement. Per Sherry Ruffing, they can bring patient in ST rehab and transition to LTC. She added if they don't have any LTC beds when needed, patient can transition to their sister facility - [facility]. Attempted to reach husband and no answer and VM not set-up. Will continue attempts to reach husband by phone and will check patient's room also.   Call made to [employer] and insurance authorization initiated with Baruch Goldmann. Reference number is [id number] and clinicals faxed to [employer] [phone]).  Talked by phone with patient's son Jasmia Angst [phone]) regarding patient's discharge disposition and [facility] accepting patient. Discussed patient starting off rehab and transitioning to LTC. Son expressed agreement and reported that he and his cousin had been helping out hsi dad with his mom, but they were limited in what they could do, as they work. Son advised that he will be kept informed regarding patient's discharge.    Expected Discharge Plan: Skilled Nursing Facility Barriers to Discharge: Other (comment) (Patient and spouse agreeable to LTC placement)  Expected Discharge Plan and Services Expected Discharge Plan: Skilled Nursing Facility In-house Referral: Clinical Social Work     Living arrangements for the past 2 months: Single Family Home (Spouse will be moving to [employer] once an apartment becomes available)                                       Social Determinants of Health (SDOH) Interventions    Readmission Risk  Interventions No flowsheet data found.

## 2020-09-04 ENCOUNTER — Ambulatory Visit: Payer: Medicare Other | Admitting: Licensed Clinical Social Worker

## 2020-09-04 NOTE — Patient Instructions (Signed)
Visit Information  Goals Addressed            This Visit's Progress   . Effective Long-Term Care Planning        Patient Goals/Self-Care Activities :  . Talk to facility to discuss long-term care needs . This will involve applying for long-term care medicaid      Patient's son verbalize understanding of plan  The care management team will reach out to the patient's son again over the next 30  days.   Sammuel Hines, LCSW Care Management & Coordination  [facility] Family [facility] / [facility]   [phone] 1:38 PM

## 2020-09-04 NOTE — Chronic Care Management (AMB) (Signed)
Care Management   Clinical Social Work Note  09/04/2020 Name: Paula Reed MRN: [id number] DOB: Apr 21, 1946  Paula Reed is a 75 y.o. year old female who is a primary care patient of Paula Mems Delorse Limber, MD. The CCM team was consulted to assist the patient with chronic disease management and/or care coordination needs related to: Level of Care Concerns.   Engaged with patient's son Paula Reed for follow up visit in response to provider referral for social work chronic care management and care coordination services.   Consent to Services:  The patient was given information about Chronic Care Management services, agreed to services, and gave verbal consent prior to initiation of services.  Please see initial visit note for detailed documentation.   Patient agreed to services and consent obtained.   Assessment: Patient's son Paula Reed provided all information during this encounter.. See Care Plan below for interventions and patient self-care actives. Recent life changes Paula Reed: discharged from hospital to skilled nursing facility  Recommendation: Patient may benefit from, and is in agreement to explore options of patient remaining at facility for long-term care.   Follow up Plan: No follow up scheduled with patient's son.  Son will contact LCSW if assistance is needed with the long-term care process.  Will f/u with son in 30 days is no call is received.    : Review of patient past medical history, allergies, medications, and health status, including review of relevant consultants reports was performed today as part of a comprehensive evaluation and provision of chronic care management and care coordination services.     SDOH (Social Determinants of Health) assessments and interventions performed:    Advanced Directives Status: See Vyn[employer] application for related entries.  CCM Care Plan  Allergies  Allergen Reactions  . Metformin And Related Rash  . Chlorthalidone Rash    Per  [facility] records, pt may have had a rash rxn to chlorthalidone    Outpatient Encounter Medications as of 09/04/2020  Medication Sig Note  . acetaminophen (TYLENOL) 500 MG tablet Take 1,000 mg by mouth every 6 (six) hours as needed for headache (pain).   [name] amLODipine (NORVASC) 5 MG tablet Take 1 tablet (5 mg total) by mouth at bedtime.   [name] atorvastatin (LIPITOR) 80 MG tablet TAKE 1 TABLET BY MOUTH DAILY AT 6 PM. (Patient taking differently: Take 80 mg by mouth at bedtime.)   . Blood Glucose Monitoring Suppl (ONETOUCH VERIO) w/Device KIT Check blood sugar once daily   . glucose blood (ONETOUCH VERIO) test strip Check blood sugar once daily   . insulin glargine (LANTUS) 100 UNIT/ML Solostar Pen Inject 15 Units into the skin daily.   . Insulin Pen Needle (PEN NEEDLES) 32G X 4 MM MISC Inject 1 Dose into the skin as directed.   . Insulin Syringe-Needle U-100 (BD INSULIN SYRINGE U/F) 31G X 5/16" 0.3 ML MISC USE TO INJECT LANTUS DAILY   . Lancet Devices (ONE TOUCH DELICA LANCING DEV) MISC Check blood sugar once daily   . OneTouch Delica Lancets 41U MISC Check blood sugar once daily   . polyethylene glycol (MIRALAX / GLYCOLAX) 17 g packet Take 17 g by mouth daily as needed for moderate constipation.   . predniSONE (DELTASONE) 10 MG tablet Take at total of 90 mg daily for a week.  Taper by 10 mg every week per instructions. (Patient taking differently: No sig reported) 08/27/2020: Take along with 50 mg tablet to make total dose of 90 mg per spouse   .  predniSONE (DELTASONE) 50 MG tablet Take 1 tablet (50 mg total) by mouth 2 (two) times daily with breakfast and lunch. 08/27/2020: Take along with 40 mg to make total dose of 90 mg per spouse   . Semaglutide,0.25 or 0.5MG/DOS, (OZEMPIC, 0.25 OR 0.5 MG/DOSE,) 2 MG/1.5ML SOPN Inject 0.25 mg into the skin once a week. (Thursdays) (Patient taking differently: Inject 0.25 mg into the skin every Thursday.)   . triamcinolone ointment (KENALOG) 0.5 % Apply 1 application  topically 2 (two) times daily as needed. (Patient taking differently: Apply 1 application topically 2 (two) times daily as needed (itching/rash).)    No facility-administered encounter medications on file as of 09/04/2020.    Patient Active Problem List   Diagnosis Date Noted  . Failure to thrive in adult 08/28/2020  . Type 2 diabetes mellitus treated with insulin (Paula Reed) 08/27/2020  . Hyperglycemia 08/27/2020  . Anemia   . Coagulopathy (Paula Reed)   . Bacteremia   . Compromised airway   . Hand pain, left 08/13/2020  . Stridor 08/12/2020  . Laryngeal edema   . Pain and swelling of right forearm 06/14/2020  . Pain due to onychomycosis of toenails of both feet 07/26/2019  . History of chronic cough 02/10/2019  . Ischemic cerebrovascular accident (CVA) (Paula Reed) 08/26/2018  . MRSA bacteremia 06/01/2018  . Allergic rhinitis 05/12/2018  . Seizures (Paula Reed) 06/28/2017  . Probable Alzheimer's dementia without behavioral disturbance 09/15/2015  . Rash and nonspecific skin eruption 12/18/2014  . Rash 09/20/2014  . Bruit of right carotid artery 03/26/2014  . Trigger finger 11/03/2012  . Heart murmur 04/22/2012  . Diabetes (Paula Reed) 02/02/2012  . Hypertension 02/02/2012  . Hyperlipidemia 02/02/2012  . Routine adult health maintenance 02/02/2012    Conditions to be addressed/monitored: Dementia; Level of care concerns and ADL IADL limitations  Care Plan : Clinical Social Worker  Updates made by Maurine Cane, LCSW since 09/04/2020 12:00 AM  Problem: no advance directive   Goal: Effective Long-Term Care Planning   Start Date: 07/04/2020  This Visit's Progress: On track  Recent Progress: Not on track  Priority: Medium  Current barriers:   .  Level of care concerns, Memory Deficits, Inability to perform ADL's independently, and Inability to perform IADL's independently Clinical Goals: Patient's son will work with health team to address needs related to long-term care needs for patient. Clinical  Interventions:  . Inter-disciplinary care team collaboration (see longitudinal plan of care) . Assessment of needs, barriers and progress ( patient was discharged to skilled nursing from hospital) . Discussed long-term care ( son would like patient to remain at current facility) . Discussed process for long-term care and to talk with administrators at facility . Will coordinated with PCP about referral for PCS services. . Provided education to patient/caregiver regarding level of care options. . Other interventions provided:Active listening / Reflection utilized , Emotional Supportive Provided, Problem Solving /Task Center , and Caregiver stress acknowledged  Patient Goals/Self-Care Activities:  . Talk to facility to discuss long-term care needs . This will involve applying for long-term care medicaid     Casimer Lanius, [place] / [place]   [phone] 1:37 PM

## 2020-09-09 ENCOUNTER — Ambulatory Visit: Payer: Medicare Other

## 2020-09-09 NOTE — Patient Instructions (Signed)
Visit Information  [name]  it was nice speaking with you. Please call me directly [phone] if you have questions about the goals we discussed.  Goals Addressed            This Visit's Progress   . Monitor and Manage My Blood Sugar-Diabetes Type 2       Timeframe:  Long-Range Goal Priority:  High Start Date:     07/01/20                        Expected End Date:  10/24/20                      Check blood sugars once daily or if symptomatic    Why is this important?    Checking your blood sugar at home helps to keep it from getting very high or very low.   Writing the results in a diary or log helps the doctor know how to care for you.   Your blood sugar log should have the time, date and the results.   Also, write down the amount of insulin or other medicine that you take.   Other information, like what you ate, exercise done and how you were feeling, will also be helpful.     Notes:        The patient verbalized understanding of instructions, educational materials, and care plan provided today and declined offer to receive copy of patient instructions, educational materials, and care plan.   Follow up Plan: Patient would like continued follow-up.  CCM RNCM will outreach the patient within the next 7 weeks.  Patient will call office if needed prior to next encounter  Juanell Fairly, RN  [phone]

## 2020-09-09 NOTE — Chronic Care Management (AMB) (Signed)
Care Management    RN Visit Note  09/09/2020 Name: Paula Reed MRN: [id number] DOB: [birth year]  Subjective: Paula Reed is a 75 y.o. year old female who is a primary care patient of Ardelia Mems Delorse Limber, MD. The care management team was consulted for assistance with disease management and care coordination needs.    Engaged with patient by telephone for follow up visit in response to provider referral for case management and/or care coordination services.   The patient was given information about Chronic Care Management services, agreed to services, and gave verbal consent prior to initiation of services.  Please see initial visit note for detailed documentation.   Patient agreed to services and consent obtained.    Assessment: Patients BS are doing well, but she needs a way to check if her BS are low.. See Care Plan below for interventions and patient self-care actives. Follow up Plan: Patient would like continued follow-up.  CCM RNCM will outreach the patient within the next 7 weeks  Patient will call office if needed prior to next encounter Review of patient past medical history, allergies, medications, health status, including review of consultants reports, laboratory and other test data, was performed as part of comprehensive evaluation and provision of chronic care management services.   SDOH (Social Determinants of Health) assessments and interventions performed:    Care Plan  Allergies  Allergen Reactions  . Metformin And Related Rash  . Chlorthalidone Rash    Per [facility] records, pt may have had a rash rxn to chlorthalidone    Outpatient Encounter Medications as of 09/09/2020  Medication Sig Note  . acetaminophen (TYLENOL) 500 MG tablet Take 1,000 mg by mouth every 6 (six) hours as needed for headache (pain).   [name] amLODipine (NORVASC) 5 MG tablet Take 1 tablet (5 mg total) by mouth at bedtime.   [name] atorvastatin (LIPITOR) 80 MG tablet TAKE 1 TABLET BY MOUTH DAILY  AT 6 PM. (Patient taking differently: Take 80 mg by mouth at bedtime.)   . Blood Glucose Monitoring Suppl (ONETOUCH VERIO) w/Device KIT Check blood sugar once daily   . glucose blood (ONETOUCH VERIO) test strip Check blood sugar once daily   . insulin glargine (LANTUS) 100 UNIT/ML Solostar Pen Inject 15 Units into the skin daily.   . Insulin Pen Needle (PEN NEEDLES) 32G X 4 MM MISC Inject 1 Dose into the skin as directed.   . Insulin Syringe-Needle U-100 (BD INSULIN SYRINGE U/F) 31G X 5/16" 0.3 ML MISC USE TO INJECT LANTUS DAILY   . Lancet Devices (ONE TOUCH DELICA LANCING DEV) MISC Check blood sugar once daily   . OneTouch Delica Lancets 72C MISC Check blood sugar once daily   . polyethylene glycol (MIRALAX / GLYCOLAX) 17 g packet Take 17 g by mouth daily as needed for moderate constipation.   . predniSONE (DELTASONE) 10 MG tablet Take at total of 90 mg daily for a week.  Taper by 10 mg every week per instructions. (Patient taking differently: No sig reported) 08/27/2020: Take along with 50 mg tablet to make total dose of 90 mg per spouse   . predniSONE (DELTASONE) 50 MG tablet Take 1 tablet (50 mg total) by mouth 2 (two) times daily with breakfast and lunch. 08/27/2020: Take along with 40 mg to make total dose of 90 mg per spouse   . Semaglutide,0.25 or 0.5MG/DOS, (OZEMPIC, 0.25 OR 0.5 MG/DOSE,) 2 MG/1.5ML SOPN Inject 0.25 mg into the skin once a week. (Thursdays) (Patient taking differently:  Inject 0.25 mg into the skin every Thursday.)   . triamcinolone ointment (KENALOG) 0.5 % Apply 1 application topically 2 (two) times daily as needed. (Patient taking differently: Apply 1 application topically 2 (two) times daily as needed (itching/rash).)    No facility-administered encounter medications on file as of 09/09/2020.    Patient Active Problem List   Diagnosis Date Noted  . Failure to thrive in adult 08/28/2020  . Type 2 diabetes mellitus treated with insulin ([place]) 08/27/2020  . Hyperglycemia  08/27/2020  . Anemia   . Coagulopathy ([place])   . Bacteremia   . Compromised airway   . Hand pain, left 08/13/2020  . Stridor 08/12/2020  . Laryngeal edema   . Pain and swelling of right forearm 06/14/2020  . Pain due to onychomycosis of toenails of both feet 07/26/2019  . History of chronic cough 02/10/2019  . Ischemic cerebrovascular accident (CVA) ([place]) 08/26/2018  . MRSA bacteremia 06/01/2018  . Allergic rhinitis 05/12/2018  . Seizures ([place]) 06/28/2017  . Probable Alzheimer's dementia without behavioral disturbance 09/15/2015  . Rash and nonspecific skin eruption 12/18/2014  . Rash 09/20/2014  . Bruit of right carotid artery 03/26/2014  . Trigger finger 11/03/2012  . Heart murmur 04/22/2012  . Diabetes ([place]) 02/02/2012  . Hypertension 02/02/2012  . Hyperlipidemia 02/02/2012  . Routine adult health maintenance 02/02/2012    Conditions to be addressed/monitored: DMII  Care Plan : RN Case Management  Updates made by Lazaro Arms, RN since 09/09/2020 12:00 AM  Problem: Glycemic Management (Diabetes, Type 2)   Goal: Glycemic Management of Diabetes  Type 2  by checking blood sugars   Start Date: 07/01/2020  Expected End Date: 10/24/2020  Recent Progress: Not on track  Priority: High  Note:   Objective:  Lab Results  Component Value Date   HGBA1C 5.4 08/28/2020 .   Lab Results  Component Value Date   CREATININE 0.68 08/30/2020   CREATININE 0.78 08/26/2020   CREATININE 0.69 08/20/2020    Current Barriers:  [name] Knowledge Deficits related to basic Diabetes pathophysiology and self care/management  Case Manager Clinical Goal(s):  Over the next 30 days, patient will demonstrate improved adherence to prescribed treatment plan for diabetes self care/management as evidenced by: checking blood sugars once daily to prevent hypoglycemia  Interventions:  . Provided education to patient about basic DM disease process . Reviewed medications with patient/spouse and discussed  importance of medication adherence . Discussed plans with patient for ongoing care management follow up and provided patient with direct contact information for care management team . activity based on tolerance and functional limitations encouraged . healthy lifestyle promoted  Anticipate A1C testing (point-of-care) every 3 to 6 months based on goal attainment.   Promote self-monitoring of blood glucose levels. -discussed problems with the glucometer and advised the spouse to have the batteries changed in the device. RNCM will follow up regarding checking blood sugars.  Assess and address barriers to management plan, such as food insecurity, age, developmental ability, depression, anxiety, fear of hypoglycemia or weight gain, as well as medication cost, side effects and complicated regimen. -Spouse stated that they were able to pay for medications , he gives his wife her meds, he was able to tell me the correct dosage of her Lantus and ozempic. He stated that they have transportation to appointments . quality of sleep assessed . reduction of sedentary activity encouraged . blood glucose monitoring encouraged . blood glucose readings reviewed-unable to give results patient does not check blood sugars. [name]  Spoke with spouse and  patient currently in [place]. Discussed members glucometer and asked to see if it is working.  Spouse notes he needs to get a new battery, but he would be sure it was ready when she is d/c from SNF.  Patient Goals/Self Care Activities:   Patient verbalizes understanding of plan Self-administers medications as prescribed  Calls pharmacy for medication refills  Call's provider office for new concerns or questions  check blood sugar at prescribed times  check blood sugar if I feel it is too high or too low         Lazaro Arms RN, BSN, [facility] Care Management Coordinator [place] Phone: [phone] I Fax: [phone]

## 2020-09-10 ENCOUNTER — Inpatient Hospital Stay: Payer: Medicare Other

## 2020-09-17 ENCOUNTER — Other Ambulatory Visit: Payer: Self-pay | Admitting: Family Medicine

## 2020-09-24 ENCOUNTER — Inpatient Hospital Stay: Payer: Medicare Other

## 2020-09-24 ENCOUNTER — Telehealth: Payer: Self-pay | Admitting: *Deleted

## 2020-09-24 ENCOUNTER — Inpatient Hospital Stay: Payer: Medicare Other | Admitting: Oncology

## 2020-09-24 NOTE — Telephone Encounter (Signed)
PC to patient's home regarding missed appointment today, the person who answered states that she is living at [facility].  PC to [place], [phone], left VM for return call for process to schedule appointments in the future.  Dr. Clelia Croft aware.

## 2020-10-24 ENCOUNTER — Ambulatory Visit: Payer: Medicare Other | Admitting: Licensed Clinical Social Worker

## 2020-10-24 DIAGNOSIS — Z789 Other specified health status: Secondary | ICD-10-CM

## 2020-10-24 NOTE — Patient Instructions (Signed)
Visit Information   Goals Addressed             This Visit's Progress    COMPLETED: Effective Long-Term Care Planning             CCM will disconnect for patient's care team  Sammuel Hines, LCSW Care Management & Coordination  [phone]

## 2020-10-24 NOTE — Chronic Care Management (AMB) (Signed)
Care Management   Clinical Social Work Note  10/24/2020 Name: Paula Reed MRN: [id number] DOB: [birth year]  Paula Reed is a 75 y.o. year old female who is a primary care patient of Ardelia Mems Delorse Limber, MD. The CCM team was consulted to assist the patient with chronic disease management and/or care coordination needs related to: Level of Care Concerns.   Collaboration with patient's son  for follow up visit in response to provider referral for social work chronic care management and care coordination services.   Consent to Services: Patient's son agreed to services and consent obtained.   Assessment: F/U phone appointment scheduled with CCM RN today to assess needs, and progress with care plan goals.  CCM LCSW providing coverage.  Patient's son provided all information during this encounter. Patient is currently long-term care at [facility]. Son reports she is adjusting well. See Care Plan below for interventions and patient self-care actives.  Follow up Plan:  All care plan goals have been met, Patient's son does not require or desire continued follow-up. Will contact the office if needed.  CCM team will disconnect from the care team.   Review of patient past medical history, allergies, medications, and health status, including review of relevant consultants reports was performed today as part of a comprehensive evaluation and provision of chronic care management and care coordination services.     SDOH (Social Determinants of Health) assessments and interventions performed:    Advanced Directives Status: Not addressed in this encounter.  CCM Care Plan  Allergies  Allergen Reactions   Metformin And Related Rash   Chlorthalidone Rash    Per [facility] records, pt may have had a rash rxn to chlorthalidone    Outpatient Encounter Medications as of 10/24/2020  Medication Sig Note   acetaminophen (TYLENOL) 500 MG tablet Take 1,000 mg by mouth every 6 (six) hours as needed  for headache (pain).    amLODipine (NORVASC) 5 MG tablet Take 1 tablet (5 mg total) by mouth at bedtime.    atorvastatin (LIPITOR) 80 MG tablet TAKE 1 TABLET BY MOUTH DAILY AT 6 PM. (Patient taking differently: Take 80 mg by mouth at bedtime.)    Blood Glucose Monitoring Suppl (ONETOUCH VERIO) w/Device KIT Check blood sugar once daily    glucose blood (ONETOUCH VERIO) test strip Check blood sugar once daily    insulin glargine (LANTUS) 100 UNIT/ML Solostar Pen Inject 15 Units into the skin daily.    Insulin Pen Needle (PEN NEEDLES) 32G X 4 MM MISC Inject 1 Dose into the skin as directed.    Insulin Syringe-Needle U-100 (BD INSULIN SYRINGE U/F) 31G X 5/16" 0.3 ML MISC USE TO INJECT LANTUS DAILY    Lancet Devices (ONE TOUCH DELICA LANCING DEV) MISC Check blood sugar once daily    OneTouch Delica Lancets 83T MISC Check blood sugar once daily    polyethylene glycol (MIRALAX / GLYCOLAX) 17 g packet Take 17 g by mouth daily as needed for moderate constipation.    predniSONE (DELTASONE) 10 MG tablet Take at total of 90 mg daily for a week.  Taper by 10 mg every week per instructions. (Patient taking differently: No sig reported) 08/27/2020: Take along with 50 mg tablet to make total dose of 90 mg per spouse    Semaglutide,0.25 or 0.5MG/DOS, (OZEMPIC, 0.25 OR 0.5 MG/DOSE,) 2 MG/1.5ML SOPN Inject 0.25 mg into the skin once a week. (Thursdays) (Patient taking differently: Inject 0.25 mg into the skin every Thursday.)  triamcinolone ointment (KENALOG) 0.5 % Apply 1 application topically 2 (two) times daily as needed. (Patient taking differently: Apply 1 application topically 2 (two) times daily as needed (itching/rash).)    No facility-administered encounter medications on file as of 10/24/2020.    Patient Active Problem List   Diagnosis Date Noted   Failure to thrive in adult 08/28/2020   Type 2 diabetes mellitus treated with insulin ([place]) 08/27/2020   Hyperglycemia 08/27/2020   Anemia    Coagulopathy  ([place])    Bacteremia    Compromised airway    Hand pain, left 08/13/2020   Stridor 08/12/2020   Laryngeal edema    Pain and swelling of right forearm 06/14/2020   Pain due to onychomycosis of toenails of both feet 07/26/2019   History of chronic cough 02/10/2019   Ischemic cerebrovascular accident (CVA) ([place]) 08/26/2018   MRSA bacteremia 06/01/2018   Allergic rhinitis 05/12/2018   Seizures ([place]) 06/28/2017   Probable Alzheimer's dementia without behavioral disturbance 09/15/2015   Rash and nonspecific skin eruption 12/18/2014   Rash 09/20/2014   Bruit of right carotid artery 03/26/2014   Trigger finger 11/03/2012   Heart murmur 04/22/2012   Diabetes ([place]) 02/02/2012   Hypertension 02/02/2012   Hyperlipidemia 02/02/2012   Routine adult health maintenance 02/02/2012    Conditions to be addressed/monitored: Dementia; Level of care concerns  Care Plan : Clinical Social Worker  Updates made by Maurine Cane, LCSW since 10/24/2020 12:00 AM   Problem: no advance directive    Goal: Effective Long-Term Care Planning Completed 10/24/2020  Start Date: 07/04/2020  Recent Progress: On track  Priority: Medium  Current barriers:    Level of care concerns, Memory Deficits, Inability to perform ADL's independently, and Inability to perform IADL's independently Clinical Goals: Patient's son will work with health team to address needs related to long-term care needs for patient. Clinical Interventions:  Inter-disciplinary care team collaboration (see longitudinal plan of care) Assessment of needs, barriers and progress ( patient was discharged to skilled nursing from hospital) Discussed long-term care ( son would like patient to remain at current facility) Discussed process for long-term care and to talk with administrators at facility Will coordinated with PCP about referral for [facility] services. Provided education to patient/caregiver regarding level of care options. Other interventions  provided:Active listening / Reflection utilized , Emotional Supportive Provided, Problem Solving [place] , and Caregiver stress acknowledged  Patient Goals/Self-Care Activities:  Talk to facility to discuss long-term care needs This will involve applying for long-term care medicaid     Casimer Lanius, [place] / [place]   [phone] 1:28 PM

## 2020-11-25 DEATH — deceased

## 2020-11-27 ENCOUNTER — Telehealth: Payer: Self-pay

## 2020-11-27 NOTE — Telephone Encounter (Signed)
not able to lvm (SD) 8.3  RE: called to schedule annual wellness visit.

## 2020-12-31 NOTE — Telephone Encounter (Signed)
HCPOA declined at this time.

## 2021-01-21 ENCOUNTER — Encounter: Payer: Self-pay | Admitting: Podiatry

## 2021-01-21 ENCOUNTER — Other Ambulatory Visit: Payer: Self-pay

## 2021-01-21 ENCOUNTER — Ambulatory Visit (INDEPENDENT_AMBULATORY_CARE_PROVIDER_SITE_OTHER): Payer: Medicare Other | Admitting: Podiatry

## 2021-01-21 DIAGNOSIS — E1149 Type 2 diabetes mellitus with other diabetic neurological complication: Secondary | ICD-10-CM

## 2021-01-21 DIAGNOSIS — B351 Tinea unguium: Secondary | ICD-10-CM | POA: Diagnosis not present

## 2021-01-21 DIAGNOSIS — M79674 Pain in right toe(s): Secondary | ICD-10-CM

## 2021-01-21 DIAGNOSIS — M79675 Pain in left toe(s): Secondary | ICD-10-CM

## 2021-01-21 DIAGNOSIS — Z794 Long term (current) use of insulin: Secondary | ICD-10-CM

## 2021-01-21 NOTE — Progress Notes (Signed)
This patient returns to my office for at risk foot care.  This patient requires this care by a professional since this patient will be at risk due to having diabetes.   This patient is unable to cut nails herself since the patient cannot reach her nails.These nails are painful walking and wearing shoes.  This patient presents for at risk foot care today.  She is accompanied today with her husband.  General Appearance  Alert, conversant and in no acute stress.  Vascular  Dorsalis pedis   pulses are weakly  palpable  bilaterally. Posterior tibial pulses are weakly palpable  B/L. Capillary return is within normal limits  Bilaterally  Cold feet  B/L. Bilaterally.  Absent digital hair.  Neurologic  Senn-Weinstein monofilament wire test within normal limits  bilaterally. Muscle power within normal limits bilaterally.  Nails Thick disfigured discolored nails with subungual debris  from hallux to fifth toes bilaterally. No evidence of bacterial infection or drainage bilaterally.  Orthopedic  No limitations of motion  feet .  No crepitus or effusions noted.  No bony pathology or digital deformities noted.  Skin  normotropic skin with no porokeratosis noted bilaterally.  No signs of infections or ulcers noted.  Vertical split left hallux nail plate.     Onychomycosis  Pain in right toes  Pain in left toes  Consent was obtained for treatment procedures.   Mechanical debridement of nails 1-5  bilaterally performed with a nail nipper.  Filed with dremel without incident.    Return office visit   4 months.                  Told patient to return for periodic foot care and evaluation due to potential at risk complications.   Helane Gunther DPM

## 2021-04-01 ENCOUNTER — Telehealth: Payer: Self-pay | Admitting: Family Medicine

## 2021-04-01 NOTE — Telephone Encounter (Signed)
Received a lengthy refill request for patient from [facility] in [place].  Please call patient and see if she would like testing supplies sent to this pharmacy.  She does not regularly use this pharmacy.  I suspect this may be an inappropriate request from a pharmaceutical company

## 2021-04-02 NOTE — Telephone Encounter (Signed)
Spoke to patient's husband who states that patient is in Nursing Facility and only uses [facility] to his knowledge.  He states that he will visit her today and ask if anything has changed.  He was given the name of the entity making the request.  .Glennie Hawk, CMA

## 2021-05-27 ENCOUNTER — Ambulatory Visit (INDEPENDENT_AMBULATORY_CARE_PROVIDER_SITE_OTHER): Payer: Medicare Other | Admitting: Podiatry

## 2021-05-27 ENCOUNTER — Encounter: Payer: Self-pay | Admitting: Podiatry

## 2021-05-27 ENCOUNTER — Other Ambulatory Visit: Payer: Self-pay

## 2021-05-27 DIAGNOSIS — Z794 Long term (current) use of insulin: Secondary | ICD-10-CM | POA: Diagnosis not present

## 2021-05-27 DIAGNOSIS — B351 Tinea unguium: Secondary | ICD-10-CM | POA: Diagnosis not present

## 2021-05-27 DIAGNOSIS — E1149 Type 2 diabetes mellitus with other diabetic neurological complication: Secondary | ICD-10-CM | POA: Diagnosis not present

## 2021-05-27 DIAGNOSIS — M79675 Pain in left toe(s): Secondary | ICD-10-CM | POA: Diagnosis not present

## 2021-05-27 DIAGNOSIS — M79674 Pain in right toe(s): Secondary | ICD-10-CM

## 2021-05-27 NOTE — Progress Notes (Signed)
This patient returns to my office for at risk foot care.  This patient requires this care by a professional since this patient will be at risk due to having diabetes.   This patient is unable to cut nails herself since the patient cannot reach her nails.These nails are painful walking and wearing shoes.  This patient presents for at risk foot care today.  She is accompanied today with her husband.  General Appearance  Alert, conversant and in no acute stress.  Vascular  Dorsalis pedis   pulses are weakly  palpable  bilaterally. Posterior tibial pulses are weakly palpable  B/L. Capillary return is within normal limits  Bilaterally  Cold feet  B/L. Bilaterally.  Absent digital hair.  Neurologic  Senn-Weinstein monofilament wire test within normal limits  bilaterally. Muscle power within normal limits bilaterally.  Nails Thick disfigured discolored nails with subungual debris  from hallux to fifth toes bilaterally. No evidence of bacterial infection or drainage bilaterally.  Orthopedic  No limitations of motion  feet .  No crepitus or effusions noted.  No bony pathology or digital deformities noted.  Skin  normotropic skin with no porokeratosis noted bilaterally.  No signs of infections or ulcers noted.  Vertical split left hallux nail plate.     Onychomycosis  Pain in right toes  Pain in left toes  Consent was obtained for treatment procedures.   Mechanical debridement of nails 1-5  bilaterally performed with a nail nipper.  Filed with dremel without incident.    Return office visit   4 months.                  Told patient to return for periodic foot care and evaluation due to potential at risk complications.   Shayona Hibbitts DPM  

## 2021-09-24 ENCOUNTER — Ambulatory Visit (INDEPENDENT_AMBULATORY_CARE_PROVIDER_SITE_OTHER): Payer: Medicare Other | Admitting: Podiatry

## 2021-09-24 ENCOUNTER — Encounter: Payer: Self-pay | Admitting: Podiatry

## 2021-09-24 DIAGNOSIS — Z794 Long term (current) use of insulin: Secondary | ICD-10-CM | POA: Diagnosis not present

## 2021-09-24 DIAGNOSIS — B351 Tinea unguium: Secondary | ICD-10-CM | POA: Diagnosis not present

## 2021-09-24 DIAGNOSIS — E1149 Type 2 diabetes mellitus with other diabetic neurological complication: Secondary | ICD-10-CM | POA: Diagnosis not present

## 2021-09-24 DIAGNOSIS — M79674 Pain in right toe(s): Secondary | ICD-10-CM

## 2021-09-24 DIAGNOSIS — M79675 Pain in left toe(s): Secondary | ICD-10-CM

## 2021-09-24 NOTE — Progress Notes (Signed)
This patient returns to my office for at risk foot care.  This patient requires this care by a professional since this patient will be at risk due to having diabetes.   This patient is unable to cut nails herself since the patient cannot reach her nails.These nails are painful walking and wearing shoes.  This patient presents for at risk foot care today.  She is accompanied today with her husband.  General Appearance  Alert, conversant and in no acute stress.  Vascular  Dorsalis pedis   pulses are weakly  palpable  bilaterally. Posterior tibial pulses are weakly palpable  B/L. Capillary return is within normal limits  Bilaterally  Cold feet  B/L. Bilaterally.  Absent digital hair.  Neurologic  Senn-Weinstein monofilament wire test within normal limits  bilaterally. Muscle power within normal limits bilaterally.  Nails Thick disfigured discolored nails with subungual debris  from hallux to fifth toes bilaterally. No evidence of bacterial infection or drainage bilaterally.  Orthopedic  No limitations of motion  feet .  No crepitus or effusions noted.  No bony pathology or digital deformities noted.  Skin  normotropic skin with no porokeratosis noted bilaterally.  No signs of infections or ulcers noted.     Onychomycosis  Pain in right toes  Pain in left toes  Consent was obtained for treatment procedures.   Mechanical debridement of nails 1-5  bilaterally performed with a nail nipper.  Filed with dremel without incident.    Return office visit   4 months.                  Told patient to return for periodic foot care and evaluation due to potential at risk complications.   Helane Gunther DPM

## 2022-01-21 ENCOUNTER — Ambulatory Visit: Payer: Medicare Other | Admitting: Podiatry

## 2022-03-10 ENCOUNTER — Telehealth: Payer: Self-pay | Admitting: Family Medicine

## 2022-03-10 NOTE — Telephone Encounter (Signed)
Husband walked in stated patient has been in a nursing facility for a year . He is requesting a prescription for a  diabetes arm monitor because her sugar is checked 4 times a day and her fingers are sore.  Call the son Marcy Salvo [phone]

## 2022-03-12 NOTE — Telephone Encounter (Signed)
There should be a facility physician overseeing her care, who can help arrange this. I am not able to order this, as patient has not been seen here in >1.5 years. Please inform son  Latrelle Dodrill, MD

## 2022-03-13 NOTE — Telephone Encounter (Signed)
Paula Reed walked in again and was informed that that the doctor is aware of his request. Unable to reach son at the number provided.

## 2022-03-13 NOTE — Telephone Encounter (Signed)
Left voice message for patient's son Linton Rump at [phone] to call office to receive message concerning patient.  If patient calls please inform patient of message for Dr. Pollie Meyer.  Glennie Hawk, CMA

## 2022-04-15 ENCOUNTER — Telehealth: Payer: Self-pay | Admitting: Family Medicine

## 2022-04-15 NOTE — Telephone Encounter (Signed)
Left message for patient to call back and schedule Medicare Annual Wellness Visit (AWV) to be done virtually or by telephone.  No hx of AWV eligible as of 02/25/13  Please schedule at anytime with [facility].      45 Minutes appointment   Any questions, please call me at [phone]

## 2023-01-29 ENCOUNTER — Ambulatory Visit (INDEPENDENT_AMBULATORY_CARE_PROVIDER_SITE_OTHER): Payer: 59 | Admitting: Podiatry

## 2023-01-29 ENCOUNTER — Encounter: Payer: Self-pay | Admitting: Podiatry

## 2023-01-29 DIAGNOSIS — Z794 Long term (current) use of insulin: Secondary | ICD-10-CM

## 2023-01-29 DIAGNOSIS — M79675 Pain in left toe(s): Secondary | ICD-10-CM | POA: Diagnosis not present

## 2023-01-29 DIAGNOSIS — M79674 Pain in right toe(s): Secondary | ICD-10-CM

## 2023-01-29 DIAGNOSIS — B351 Tinea unguium: Secondary | ICD-10-CM | POA: Diagnosis not present

## 2023-01-29 DIAGNOSIS — E1149 Type 2 diabetes mellitus with other diabetic neurological complication: Secondary | ICD-10-CM | POA: Diagnosis not present

## 2023-01-29 NOTE — Progress Notes (Signed)
This patient returns to my office for at risk foot care.  This patient requires this care by a professional since this patient will be at risk due to having diabetes.   This patient is unable to cut nails herself since the patient cannot reach her nails.These nails are painful walking and wearing shoes.  This patient presents for at risk foot care today.  She is accompanied today with her husband.  General Appearance  Alert, conversant and in no acute stress.  Vascular  Dorsalis pedis   pulses are weakly  palpable  bilaterally. Posterior tibial pulses are weakly palpable  B/L. Capillary return is within normal limits  Bilaterally  Cold feet  B/L. Bilaterally.  Absent digital hair.  Neurologic  Senn-Weinstein monofilament wire test within normal limits  bilaterally. Muscle power within normal limits bilaterally.  Nails Thick disfigured discolored nails with subungual debris  from hallux to fifth toes bilaterally. No evidence of bacterial infection or drainage bilaterally.  Orthopedic  No limitations of motion  feet .  No crepitus or effusions noted.  No bony pathology or digital deformities noted.  Skin  normotropic skin with no porokeratosis noted bilaterally.  No signs of infections or ulcers noted.     Onychomycosis  Pain in right toes  Pain in left toes  Consent was obtained for treatment procedures.   Mechanical debridement of nails 1-5  bilaterally performed with a nail nipper.  Filed with dremel without incident.    Return office visit   4 months.                  Told patient to return for periodic foot care and evaluation due to potential at risk complications.   Helane Gunther DPM

## 2023-02-25 ENCOUNTER — Encounter: Payer: Self-pay | Admitting: Family Medicine

## 2023-02-25 ENCOUNTER — Other Ambulatory Visit: Payer: Self-pay

## 2023-02-25 ENCOUNTER — Ambulatory Visit: Payer: 59 | Admitting: Family Medicine

## 2023-02-25 VITALS — BP 129/70 | HR 90 | Ht 66.0 in | Wt 190.2 lb

## 2023-02-25 DIAGNOSIS — Z789 Other specified health status: Secondary | ICD-10-CM

## 2023-02-25 DIAGNOSIS — I10 Essential (primary) hypertension: Secondary | ICD-10-CM

## 2023-02-25 DIAGNOSIS — Z794 Long term (current) use of insulin: Secondary | ICD-10-CM

## 2023-02-25 DIAGNOSIS — E119 Type 2 diabetes mellitus without complications: Secondary | ICD-10-CM

## 2023-02-25 LAB — POCT GLYCOSYLATED HEMOGLOBIN (HGB A1C): HbA1c, POC (controlled diabetic range): 5.8 % (ref 0.0–7.0)

## 2023-02-25 NOTE — Progress Notes (Signed)
  Date of Visit: 02/25/2023   SUBJECTIVE:   HPI:  Shawndra presents today with her husband Billey Gosling, who has brought her in from her residential nursing facility to "be seen by a doctor."  This is the first time I have seen patient since the spring of 2022.  For the last 2+ years patient has lived at [facility] nursing facility.  Billey Gosling is concerned that they do not actually have a doctor on staff there and that she is not being evaluated regularly by a physician.  He does not know her current medications.  Patient has no specific concerns.  Billey Gosling also has no specific concerns, other than being worried that she is not being seen by a doctor.  He does report that they check her blood sugar regularly.   OBJECTIVE:   BP 129/70   Pulse 90   Ht 5\' 6"  (1.676 m)   Wt 190 lb 3.2 oz (86.3 kg)   SpO2 100%   BMI 30.70 kg/m  Gen: No acute distress, pleasant, cooperative, well-appearing HEENT: Normocephalic, atraumatic Heart: Regular rate and rhythm, no murmur Lungs: Clear to auscultation bilaterally, normal effort Neuro: Grossly nonfocal, speech normal.  Pleasantly confused but conversant. Ext: No edema  ASSESSMENT/PLAN:   Assessment & Plan Type 2 diabetes mellitus treated with insulin (HCC) A1c 5.8.  Well-controlled.  Unable to make any adjustments to her medications as I do not actually know what meds she is on. Resides in skilled nursing facility Discussed husband's concerns extensively.  I advised that to my knowledge all skilled nursing facilities have a facility physician who monitors and evaluates patients regularly and functions as their PCP.  To reassure him, I attempted to call [facility] to speak with their clinical staff and get the name of the physician who sees her.  Despite trying to call for 10+ minutes, was not able to actually speak with any staff member.  I left a voicemail asking them to call me back.  Advised husband that I will call their son [name] once I am able  to speak with someone from the facility. Primary hypertension Well-controlled.  Again, not adjusting any medications as no medical records are available to me right now.   [place] J. Pollie Meyer, MD [facility]  Greater than 25 minutes were spent on this encounter on the day of service, including pre-visit planning, actual face to face time, coordination of care, and documentation of visit.

## 2023-02-25 NOTE — Patient Instructions (Signed)
It was great to see you again today.  I will let you know what I hear from the facility  Be well, Dr. Pollie Meyer

## 2023-02-26 ENCOUNTER — Telehealth: Payer: Self-pay | Admitting: Family Medicine

## 2023-02-26 NOTE — Telephone Encounter (Signed)
Called [facility] nursing facility again today since no one called me back yesterday.  Was able to speak with staff member Maurique who knows patient well. Maurique reports that patient is seen and evaluated regularly by Dr. Kerby Nora, the facility physician.  I recommended that they have this physician call patient's son [name] to give updates on her care, since the family is under the impression she is not being evaluated by a doctor.  Attempted to reach son [name] to discuss this information.  No answer x 3 calls.  Left HIPAA compliant voicemail asking him to return the call.  I am out of the office next week.  If he calls back, please let him know that I spoke with [facility] and that the facility physician is named Dr. Kerby Nora, and that he has been regularly seeing and evaluating Paula Reed.  Latrelle Dodrill, MD

## 2023-02-27 NOTE — Assessment & Plan Note (Signed)
Well-controlled.  Again, not adjusting any medications as no medical records are available to me right now.

## 2023-02-27 NOTE — Assessment & Plan Note (Signed)
A1c 5.8.  Well-controlled.  Unable to make any adjustments to her medications as I do not actually know what meds she is on.

## 2023-06-01 ENCOUNTER — Ambulatory Visit (INDEPENDENT_AMBULATORY_CARE_PROVIDER_SITE_OTHER): Payer: Medicare (Managed Care) | Admitting: Podiatry

## 2023-06-01 ENCOUNTER — Encounter: Payer: Self-pay | Admitting: Podiatry

## 2023-06-01 DIAGNOSIS — E1149 Type 2 diabetes mellitus with other diabetic neurological complication: Secondary | ICD-10-CM | POA: Diagnosis not present

## 2023-06-01 DIAGNOSIS — M79675 Pain in left toe(s): Secondary | ICD-10-CM | POA: Diagnosis not present

## 2023-06-01 DIAGNOSIS — B351 Tinea unguium: Secondary | ICD-10-CM

## 2023-06-01 DIAGNOSIS — Z794 Long term (current) use of insulin: Secondary | ICD-10-CM

## 2023-06-01 DIAGNOSIS — M79674 Pain in right toe(s): Secondary | ICD-10-CM | POA: Diagnosis not present

## 2023-06-01 NOTE — Progress Notes (Signed)
 This patient returns to my office for at risk foot care.  This patient requires this care by a professional since this patient will be at risk due to having diabetes.   This patient is unable to cut nails herself since the patient cannot reach her nails.These nails are painful walking and wearing shoes.  This patient presents for at risk foot care today.  She is accompanied today with her husband.  General Appearance  Alert, conversant and in no acute stress.  Vascular  Dorsalis pedis   pulses are weakly  palpable  bilaterally. Posterior tibial pulses are weakly palpable  B/L. Capillary return is within normal limits  Bilaterally  Cold feet  B/L. Bilaterally.  Absent digital hair.  Neurologic  Senn-Weinstein monofilament wire test within normal limits  bilaterally. Muscle power within normal limits bilaterally.  Nails Thick disfigured discolored nails with subungual debris  from hallux to fifth toes bilaterally. No evidence of bacterial infection or drainage bilaterally.  Orthopedic  No limitations of motion  feet .  No crepitus or effusions noted.  No bony pathology or digital deformities noted.  Skin  normotropic skin with no porokeratosis noted bilaterally.  No signs of infections or ulcers noted.     Onychomycosis  Pain in right toes  Pain in left toes  Consent was obtained for treatment procedures.   Mechanical debridement of nails 1-5  bilaterally performed with a nail nipper.  Filed with dremel without incident.    Return office visit   3 months.                  Told patient to return for periodic foot care and evaluation due to potential at risk complications.   Helane Gunther DPM

## 2023-08-31 ENCOUNTER — Ambulatory Visit: Payer: Medicare (Managed Care) | Admitting: Podiatry

## 2023-09-01 ENCOUNTER — Ambulatory Visit (INDEPENDENT_AMBULATORY_CARE_PROVIDER_SITE_OTHER): Payer: Medicare (Managed Care) | Admitting: Podiatry

## 2023-09-01 ENCOUNTER — Encounter: Payer: Self-pay | Admitting: Podiatry

## 2023-09-01 DIAGNOSIS — M79675 Pain in left toe(s): Secondary | ICD-10-CM | POA: Diagnosis not present

## 2023-09-01 DIAGNOSIS — M79674 Pain in right toe(s): Secondary | ICD-10-CM

## 2023-09-01 DIAGNOSIS — Z794 Long term (current) use of insulin: Secondary | ICD-10-CM | POA: Diagnosis not present

## 2023-09-01 DIAGNOSIS — E1149 Type 2 diabetes mellitus with other diabetic neurological complication: Secondary | ICD-10-CM

## 2023-09-01 DIAGNOSIS — B351 Tinea unguium: Secondary | ICD-10-CM | POA: Diagnosis not present

## 2023-09-01 NOTE — Progress Notes (Signed)
 This patient returns to my office for at risk foot care.  This patient requires this care by a professional since this patient will be at risk due to having diabetes.   This patient is unable to cut nails herself since the patient cannot reach her nails.These nails are painful walking and wearing shoes.  This patient presents for at risk foot care today.  She is accompanied today with her husband.  General Appearance  Alert, conversant and in no acute stress.  Vascular  Dorsalis pedis   pulses are weakly  palpable  bilaterally. Posterior tibial pulses are weakly palpable  B/L. Capillary return is within normal limits  Bilaterally  Cold feet  B/L. Bilaterally.  Absent digital hair.  Neurologic  Senn-Weinstein monofilament wire test within normal limits  bilaterally. Muscle power within normal limits bilaterally.  Nails Thick disfigured discolored nails with subungual debris  from hallux to fifth toes bilaterally. No evidence of bacterial infection or drainage bilaterally.  Orthopedic  No limitations of motion  feet .  No crepitus or effusions noted.  No bony pathology or digital deformities noted.  Skin  normotropic skin with no porokeratosis noted bilaterally.  No signs of infections or ulcers noted.     Onychomycosis  Pain in right toes  Pain in left toes  Consent was obtained for treatment procedures.   Mechanical debridement of nails 1-5  bilaterally performed with a nail nipper.  Filed with dremel without incident.    Return office visit   3 months.                  Told patient to return for periodic foot care and evaluation due to potential at risk complications.   Ruffin Cotton DPM

## 2023-12-02 ENCOUNTER — Ambulatory Visit: Payer: Medicare (Managed Care) | Admitting: Podiatry

## 2023-12-03 ENCOUNTER — Ambulatory Visit: Payer: Medicare (Managed Care) | Admitting: Podiatry
